# Patient Record
Sex: Male | Born: 1955
Health system: Southern US, Community
[De-identification: ages and names within clinical notes are randomized; demographics above are authoritative.]

## PROBLEM LIST (undated history)

## (undated) ENCOUNTER — Emergency Department (HOSPITAL_BASED_OUTPATIENT_CLINIC_OR_DEPARTMENT_OTHER): Admission: EM | Payer: 59 | Source: Home / Self Care

## (undated) DIAGNOSIS — M1990 Unspecified osteoarthritis, unspecified site: Secondary | ICD-10-CM

## (undated) DIAGNOSIS — N400 Enlarged prostate without lower urinary tract symptoms: Secondary | ICD-10-CM

## (undated) DIAGNOSIS — R112 Nausea with vomiting, unspecified: Secondary | ICD-10-CM

## (undated) DIAGNOSIS — S3991XA Unspecified injury of abdomen, initial encounter: Secondary | ICD-10-CM

## (undated) DIAGNOSIS — E291 Testicular hypofunction: Secondary | ICD-10-CM

## (undated) DIAGNOSIS — G8929 Other chronic pain: Secondary | ICD-10-CM

## (undated) DIAGNOSIS — M818 Other osteoporosis without current pathological fracture: Secondary | ICD-10-CM

## (undated) DIAGNOSIS — B029 Zoster without complications: Secondary | ICD-10-CM

## (undated) DIAGNOSIS — M542 Cervicalgia: Secondary | ICD-10-CM

## (undated) DIAGNOSIS — L719 Rosacea, unspecified: Secondary | ICD-10-CM

## (undated) DIAGNOSIS — N411 Chronic prostatitis: Secondary | ICD-10-CM

## (undated) DIAGNOSIS — Z9889 Other specified postprocedural states: Secondary | ICD-10-CM

## (undated) DIAGNOSIS — T7840XA Allergy, unspecified, initial encounter: Secondary | ICD-10-CM

## (undated) DIAGNOSIS — I7300 Raynaud's syndrome without gangrene: Secondary | ICD-10-CM

## (undated) DIAGNOSIS — I8390 Asymptomatic varicose veins of unspecified lower extremity: Secondary | ICD-10-CM

## (undated) DIAGNOSIS — K219 Gastro-esophageal reflux disease without esophagitis: Secondary | ICD-10-CM

## (undated) DIAGNOSIS — J309 Allergic rhinitis, unspecified: Secondary | ICD-10-CM

## (undated) DIAGNOSIS — J189 Pneumonia, unspecified organism: Secondary | ICD-10-CM

## (undated) DIAGNOSIS — H01009 Unspecified blepharitis unspecified eye, unspecified eyelid: Secondary | ICD-10-CM

## (undated) HISTORY — PX: SHOULDER SURGERY: SHX246

## (undated) HISTORY — DX: Gastro-esophageal reflux disease without esophagitis: K21.9

## (undated) HISTORY — DX: Allergy, unspecified, initial encounter: T78.40XA

## (undated) HISTORY — DX: Testicular hypofunction: E29.1

## (undated) HISTORY — DX: Other osteoporosis without current pathological fracture: M81.8

## (undated) HISTORY — DX: Unspecified injury of abdomen, initial encounter: S39.91XA

## (undated) HISTORY — DX: Cervicalgia: M54.2

## (undated) HISTORY — DX: Allergic rhinitis, unspecified: J30.9

## (undated) HISTORY — DX: Chronic prostatitis: N41.1

## (undated) HISTORY — DX: Raynaud's syndrome without gangrene: I73.00

## (undated) HISTORY — PX: KNEE ARTHROSCOPY: SUR90

## (undated) HISTORY — DX: Other chronic pain: G89.29

## (undated) HISTORY — PX: COLONOSCOPY: SHX174

## (undated) HISTORY — DX: Unspecified blepharitis unspecified eye, unspecified eyelid: H01.009

## (undated) HISTORY — PX: BLEPHAROPLASTY: SUR158

## (undated) HISTORY — PX: POLYPECTOMY: SHX149

## (undated) HISTORY — PX: EYE SURGERY: SHX253

## (undated) HISTORY — DX: Zoster without complications: B02.9

## (undated) HISTORY — PX: VARICOSE VEIN SURGERY: SHX832

## (undated) HISTORY — PX: SIGMOIDOSCOPY: SUR1295

## (undated) HISTORY — DX: Benign prostatic hyperplasia without lower urinary tract symptoms: N40.0

## (undated) HISTORY — DX: Asymptomatic varicose veins of unspecified lower extremity: I83.90

## (undated) HISTORY — DX: Rosacea, unspecified: L71.9

---

## 1977-07-06 HISTORY — PX: HERNIA REPAIR: SHX51

## 1992-07-06 HISTORY — PX: CERVICAL FUSION: SHX112

## 1996-07-06 HISTORY — PX: CERVICAL DISCECTOMY: SHX98

## 2004-05-06 ENCOUNTER — Emergency Department (HOSPITAL_COMMUNITY): Admission: EM | Admit: 2004-05-06 | Discharge: 2004-05-06 | Payer: Self-pay | Admitting: Emergency Medicine

## 2005-02-06 ENCOUNTER — Ambulatory Visit: Payer: Self-pay | Admitting: Internal Medicine

## 2005-06-26 ENCOUNTER — Ambulatory Visit: Payer: Self-pay | Admitting: Internal Medicine

## 2005-07-07 ENCOUNTER — Ambulatory Visit: Payer: Self-pay | Admitting: Internal Medicine

## 2005-07-13 ENCOUNTER — Ambulatory Visit (HOSPITAL_COMMUNITY): Admission: RE | Admit: 2005-07-13 | Discharge: 2005-07-13 | Payer: Self-pay | Admitting: Optometry

## 2005-11-02 ENCOUNTER — Ambulatory Visit: Payer: Self-pay | Admitting: Internal Medicine

## 2005-12-01 ENCOUNTER — Ambulatory Visit: Payer: Self-pay | Admitting: Internal Medicine

## 2006-03-15 ENCOUNTER — Ambulatory Visit: Payer: Self-pay | Admitting: Internal Medicine

## 2006-07-15 ENCOUNTER — Ambulatory Visit: Payer: Self-pay | Admitting: Internal Medicine

## 2006-10-08 ENCOUNTER — Ambulatory Visit: Payer: Self-pay | Admitting: Internal Medicine

## 2006-11-25 ENCOUNTER — Ambulatory Visit: Payer: Self-pay | Admitting: Internal Medicine

## 2006-12-07 ENCOUNTER — Encounter: Admission: RE | Admit: 2006-12-07 | Discharge: 2007-03-07 | Payer: Self-pay | Admitting: Neurology

## 2007-04-22 ENCOUNTER — Telehealth: Payer: Self-pay | Admitting: Internal Medicine

## 2007-05-04 ENCOUNTER — Encounter: Payer: Self-pay | Admitting: Internal Medicine

## 2007-08-11 ENCOUNTER — Ambulatory Visit: Payer: Self-pay | Admitting: Internal Medicine

## 2007-08-11 DIAGNOSIS — R51 Headache: Secondary | ICD-10-CM

## 2007-08-11 DIAGNOSIS — H01009 Unspecified blepharitis unspecified eye, unspecified eyelid: Secondary | ICD-10-CM | POA: Insufficient documentation

## 2007-08-11 DIAGNOSIS — M818 Other osteoporosis without current pathological fracture: Secondary | ICD-10-CM

## 2007-08-11 DIAGNOSIS — E291 Testicular hypofunction: Secondary | ICD-10-CM

## 2007-08-11 DIAGNOSIS — F519 Sleep disorder not due to a substance or known physiological condition, unspecified: Secondary | ICD-10-CM | POA: Insufficient documentation

## 2007-08-11 DIAGNOSIS — R519 Headache, unspecified: Secondary | ICD-10-CM | POA: Insufficient documentation

## 2007-08-11 HISTORY — DX: Testicular hypofunction: E29.1

## 2007-08-11 HISTORY — DX: Other osteoporosis without current pathological fracture: M81.8

## 2007-08-19 ENCOUNTER — Encounter (INDEPENDENT_AMBULATORY_CARE_PROVIDER_SITE_OTHER): Payer: Self-pay | Admitting: *Deleted

## 2007-10-11 ENCOUNTER — Encounter: Payer: Self-pay | Admitting: Internal Medicine

## 2007-10-11 LAB — CONVERTED CEMR LAB
ALT: 23 units/L
AST: 17 units/L
Albumin: 3.9 g/dL
Alkaline Phosphatase: 41 units/L
BUN: 18 mg/dL
CO2, serum: 30 mmol/L
Calcium: 8.9 mg/dL
Chloride, Serum: 107 mmol/L
Creatinine, Ser: 0.9 mg/dL
HCT: 47.6 %
Hemoglobin: 16.7 g/dL
MCH: 31.7 pg
MCV: 90.4 fL
PSA: 3.31 ng/mL
Platelets: 206 10*3/uL
Potassium, serum: 4 mmol/L
RBC count: 5.26 10*6/uL
Sodium, serum: 138 mmol/L
Testosterone, total: 4.27 ng/mL
Total Bilirubin: 1.1 mg/dL
Total Protein: 5.7 g/dL
WBC, blood: 6 10*3/uL

## 2007-11-03 ENCOUNTER — Encounter: Payer: Self-pay | Admitting: Internal Medicine

## 2007-11-03 LAB — CONVERTED CEMR LAB: PSA: 3.06 ng/mL

## 2008-01-02 ENCOUNTER — Encounter: Payer: Self-pay | Admitting: Internal Medicine

## 2008-04-11 ENCOUNTER — Encounter: Payer: Self-pay | Admitting: Internal Medicine

## 2008-04-12 ENCOUNTER — Ambulatory Visit: Payer: Self-pay | Admitting: Internal Medicine

## 2008-04-13 ENCOUNTER — Encounter: Payer: Self-pay | Admitting: Internal Medicine

## 2008-04-17 ENCOUNTER — Encounter (INDEPENDENT_AMBULATORY_CARE_PROVIDER_SITE_OTHER): Payer: Self-pay | Admitting: *Deleted

## 2008-05-09 ENCOUNTER — Ambulatory Visit: Payer: Self-pay | Admitting: Internal Medicine

## 2008-05-11 ENCOUNTER — Encounter (INDEPENDENT_AMBULATORY_CARE_PROVIDER_SITE_OTHER): Payer: Self-pay | Admitting: *Deleted

## 2008-06-06 ENCOUNTER — Encounter: Admission: RE | Admit: 2008-06-06 | Discharge: 2008-07-04 | Payer: Self-pay | Admitting: Sports Medicine

## 2008-07-03 ENCOUNTER — Ambulatory Visit: Payer: Self-pay | Admitting: Internal Medicine

## 2008-07-09 ENCOUNTER — Ambulatory Visit: Payer: Self-pay | Admitting: Internal Medicine

## 2008-07-09 ENCOUNTER — Encounter: Admission: RE | Admit: 2008-07-09 | Discharge: 2008-10-01 | Payer: Self-pay | Admitting: Sports Medicine

## 2008-08-20 ENCOUNTER — Encounter: Payer: Self-pay | Admitting: Internal Medicine

## 2008-10-09 ENCOUNTER — Encounter: Payer: Self-pay | Admitting: Internal Medicine

## 2008-12-11 ENCOUNTER — Encounter: Payer: Self-pay | Admitting: Internal Medicine

## 2008-12-17 ENCOUNTER — Encounter: Payer: Self-pay | Admitting: Internal Medicine

## 2009-01-10 ENCOUNTER — Telehealth (INDEPENDENT_AMBULATORY_CARE_PROVIDER_SITE_OTHER): Payer: Self-pay | Admitting: *Deleted

## 2009-01-14 ENCOUNTER — Encounter: Payer: Self-pay | Admitting: Internal Medicine

## 2009-01-14 ENCOUNTER — Telehealth (INDEPENDENT_AMBULATORY_CARE_PROVIDER_SITE_OTHER): Payer: Self-pay | Admitting: *Deleted

## 2009-01-23 ENCOUNTER — Telehealth (INDEPENDENT_AMBULATORY_CARE_PROVIDER_SITE_OTHER): Payer: Self-pay | Admitting: *Deleted

## 2009-01-25 ENCOUNTER — Encounter: Payer: Self-pay | Admitting: Internal Medicine

## 2009-04-03 ENCOUNTER — Ambulatory Visit: Payer: Self-pay | Admitting: Internal Medicine

## 2009-04-03 DIAGNOSIS — R109 Unspecified abdominal pain: Secondary | ICD-10-CM

## 2009-07-06 HISTORY — PX: TRANSURETHRAL RESECTION OF PROSTATE: SHX73

## 2009-07-17 ENCOUNTER — Encounter: Payer: Self-pay | Admitting: Internal Medicine

## 2009-09-04 ENCOUNTER — Ambulatory Visit: Payer: Self-pay | Admitting: Internal Medicine

## 2009-12-19 ENCOUNTER — Ambulatory Visit: Payer: Self-pay | Admitting: Internal Medicine

## 2009-12-19 DIAGNOSIS — J309 Allergic rhinitis, unspecified: Secondary | ICD-10-CM

## 2009-12-19 DIAGNOSIS — J3089 Other allergic rhinitis: Secondary | ICD-10-CM

## 2009-12-19 HISTORY — DX: Allergic rhinitis, unspecified: J30.9

## 2009-12-20 ENCOUNTER — Telehealth (INDEPENDENT_AMBULATORY_CARE_PROVIDER_SITE_OTHER): Payer: Self-pay | Admitting: *Deleted

## 2010-01-10 ENCOUNTER — Ambulatory Visit: Payer: Self-pay | Admitting: Internal Medicine

## 2010-01-10 DIAGNOSIS — L980 Pyogenic granuloma: Secondary | ICD-10-CM | POA: Insufficient documentation

## 2010-07-16 ENCOUNTER — Encounter: Payer: Self-pay | Admitting: Internal Medicine

## 2010-08-03 LAB — CONVERTED CEMR LAB
ALT: 25 units/L (ref 0–53)
AST: 21 units/L (ref 0–37)
BUN: 22 mg/dL (ref 6–23)
Basophils Absolute: 0 10*3/uL (ref 0.0–0.1)
Basophils Relative: 0.3 % (ref 0.0–3.0)
CO2: 31 meq/L (ref 19–32)
Calcium: 8.9 mg/dL (ref 8.4–10.5)
Chloride: 105 meq/L (ref 96–112)
Cholesterol: 197 mg/dL (ref 0–200)
Creatinine, Ser: 0.7 mg/dL (ref 0.4–1.5)
Eosinophils Absolute: 0.1 10*3/uL (ref 0.0–0.7)
Eosinophils Relative: 1.2 % (ref 0.0–5.0)
GFR calc Af Amer: 151 mL/min
GFR calc non Af Amer: 125 mL/min
Glucose, Bld: 97 mg/dL (ref 70–99)
HCT: 48.4 % (ref 39.0–52.0)
HDL: 37.4 mg/dL — ABNORMAL LOW (ref >39.0)
Hemoglobin: 17 g/dL (ref 13.0–17.0)
LDL Cholesterol: 142 mg/dL — ABNORMAL HIGH (ref 0–99)
Lymphocytes Relative: 30.5 % (ref 12.0–46.0)
MCHC: 35.2 g/dL (ref 30.0–36.0)
MCV: 91.9 fL (ref 78.0–100.0)
Monocytes Absolute: 0.4 10*3/uL (ref 0.1–1.0)
Monocytes Relative: 8 % (ref 3.0–12.0)
Neutro Abs: 2.8 10*3/uL (ref 1.4–7.7)
Neutrophils Relative %: 60 % (ref 43.0–77.0)
Platelets: 205 10*3/uL (ref 150–400)
Potassium: 4.1 meq/L (ref 3.5–5.1)
RBC: 5.27 M/uL (ref 4.22–5.81)
RDW: 12.2 % (ref 11.5–14.6)
Sodium: 141 meq/L (ref 135–145)
TSH: 1.66 microintl units/mL (ref 0.35–5.50)
Total CHOL/HDL Ratio: 5.3
Triglycerides: 88 mg/dL (ref 0–149)
VLDL: 18 mg/dL (ref 0–40)
WBC: 4.8 10*3/uL (ref 4.5–10.5)

## 2010-08-05 NOTE — Progress Notes (Signed)
Summary: change nasonex to flonase   Phone Note Call from Patient   Caller: Patient Summary of Call: patient called says he was told that if nasonex was to expensive he could be switch to flonase informed patient we will change and send into pharmacy .......Marland KitchenDoristine Devoid  December 20, 2009 2:09 PM     New/Updated Medications: FLONASE 50 MCG/ACT SUSP (FLUTICASONE PROPIONATE) 2 sprays each nostril daily Prescriptions: FLONASE 50 MCG/ACT SUSP (FLUTICASONE PROPIONATE) 2 sprays each nostril daily  #1 x 3   Entered by:   Doristine Devoid   Authorized by:   Nolon Rod. Paz MD   Signed by:   Doristine Devoid on 12/20/2009   Method used:   Electronically to        [facility]* (retail)       [address]       [place], [place]  [address]       Ph: [phone]       Fax: [phone]   RxID:   [id number]

## 2010-08-05 NOTE — Assessment & Plan Note (Signed)
Summary: BITE ON LFT HAND NOT HEALED FROM APRIL///SPH   Vital Signs:  Patient profile:   55 year old male Weight:      214.6 pounds Temp:     98.1 degrees F oral Pulse rate:   60 / minute Resp:     14 per minute BP sitting:   104 / 70  (left arm) Cuff size:   large  Vitals Entered By: Shonna Chock (January 10, 2010 1:57 PM) CC: Left hand concerns Comments REVIEWED MED LIST, PATIENT AGREED DOSE AND INSTRUCTION CORRECT    CC:  Left hand concerns.  History of Present Illness: He noted itching & burning  L dorsal hand on 10/07/2009 in early am  while in [place] . Rx: antibiotic ointment X 1-2 weeks with resolution of central "white spot" .He now has intermittent  residual itching. He now uses Neosporin 4-5X / week "to control the itching".  Allergies (verified): No Known Drug Allergies  Review of Systems General:  Denies chills, fever, sweats, and weight loss. Derm:  Complains of changes in color of skin; Persistant redness .  Physical Exam  General:  well-nourished,in no acute distress; alert,appropriate and cooperative throughout examination Skin:  3X3 mm non blanching granuloma dorsum L hand, ? foreign body centrally Cervical Nodes:  No lymphadenopathy noted Axillary Nodes:  No palpable lymphadenopathy. No epitrochlear LA   Impression & Recommendations:  Problem # 1:  GRANULOMA (ICD-686.1) ? foreign body reaction, ? component contact dermatitis from Neosporin   Complete Medication List: 1)  Neurontin  .Marland KitchenMarland Kitchen. 1800-2400 mg once daily 2)  Celebrex 200 Mg Caps (Celecoxib) .... Once daily 3)  Prilosec 20 Mg Cpdr (Omeprazole) .... As needed 4)  Testosterone Injections  .Marland Kitchen.. 1ml bi monthly 5)  Oracea 40 Mg Cpdr (Doxycycline (rosacea)) .... Per ophtalmology 6)  Ees Ointment, Eye  .... Per ophtalmology 7)  Flonase 50 Mcg/act Susp (Fluticasone propionate) .... 2 sprays each nostril daily 8)  Loratadine 10 Mg Tabs (Loratadine) .Marland Kitchen.. 1 a day for allergies (otc)  Patient  Instructions: 1)  Report Warning Signs as discussed. Stop Neosporin ; use Cort Aid two times a day as needed . Hand Surgery referral if lesion progresses

## 2010-08-05 NOTE — Assessment & Plan Note (Signed)
Summary: roa pet pt/lch   Vital Signs:  Patient profile:   55 year old male Height:      72 inches Weight:      213.2 pounds BMI:     29.02 Pulse rate:   60 / minute BP sitting:   100 / 60  Vitals Entered By: Shary Decamp (September 04, 2009 4:11 PM) CC: opth @ [facility] is requesting a sleep study, Ines Bloomer, MD phone [phone], fax [phone] Comments  - check rt thumb ?sliver in thumb Shary Decamp  September 04, 2009 4:12 PM    History of Present Illness: opth @ [facility] , Ines Bloomer, MD suggested that he may have a sleep apnea because the research shows a relationship between droopy  eyelids (which the patient has) and OSA  he removed a splinter from his right thumb few days ago, the area is still tender   Current Medications (verified): 1)  Ambien Cr 12.5 Mg Tbcr (Zolpidem Tartrate) .... Take 1 Tablet By Mouth Every Night 2)  Actonel 35 Mg Tabs (Risedronate Sodium) .... Take 1 Tablet By Mouth Once A Week 3)  Proctofoam 1 %  Foam (Pramoxine Hcl) .... 5 Times A Day Prn 4)  Neurontin .Marland KitchenMarland Kitchen. 1800-2400 Mg Once Daily 5)  Celebrex 200 Mg Caps (Celecoxib) .... Once Daily 6)  Prilosec 20 Mg Cpdr (Omeprazole) .... As Needed 7)  Testosterone Injections .Marland Kitchen.. 1ml Bi Monthly  Allergies (verified): No Known Drug Allergies  Past History:  Past Medical History: INSOMNIA-SLEEP DISORDER-UNSPEC  OSTEOPOROSIS, IDIOPATHIC-- f/u at [facility] HEADACHE--migraines BLEPHARITIS, BILATERAL  HYPOGONADISM--f/u by Dr Cleatrice Burke urology Chronic Prostatitis-- f/u by urology, has a prostate massage q 6 weeks  BPH , s/p TURP Cscope 1-11 Dr Posey Rea, neg   1-11  Past Surgical History: 4/03 false positive stress test 3/07 NCS VE TURP 1-11  Social History: Reviewed history from 04/12/2008 and no changes required. 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   Extremities:  right thumb has a 1 mm induration, quite superficial, no obvious FB   Impression & Recommendations:  Problem # 1:  INSOMNIA-SLEEP DISORDER-UNSPEC (ICD-307.40) he used to have insomnia and required Ambien but he is not taking that anymore. I will leave Ambien in the medications  list because he may use it as needed he has no evidence of sleep apnea on clinical grounds.  Will revisit this issue as needed  Problem # 2:  FB right thumb? I did a very small incision with a needle, no apparent foreign body  observation, patient will call if signs of infection.  Complete Medication List: 1)  Ambien Cr 12.5 Mg Tbcr (Zolpidem tartrate) .... Take 1 tablet by mouth every night 2)  Actonel 35 Mg Tabs (Risedronate sodium) .... Take 1 tablet by mouth once a week 3)  Proctofoam 1 % Foam (Pramoxine hcl) .... 5 times a day prn 4)  Neurontin  .Marland KitchenMarland Kitchen. 1800-2400 mg once daily 5)  Celebrex 200 Mg Caps (Celecoxib) .... Once daily 6)  Prilosec 20 Mg Cpdr (Omeprazole) .... As needed 7)  Testosterone Injections  .Marland Kitchen.. 1ml bi monthly

## 2010-08-05 NOTE — Assessment & Plan Note (Signed)
Summary: referral for an allergist//kn   Vital Signs:  Patient profile:   55 year old male Height:      72 inches Weight:      201 pounds Temp:     98.6 degrees F oral Pulse rate:   71 / minute BP sitting:   100 / 82  (left arm)  Vitals Entered By: Jeremy Johann CMA (December 19, 2009 11:24 AM) CC: discuss referral for allergist Comments REVIEWED MED LIST, PATIENT AGREED DOSE AND INSTRUCTION CORRECT    History of Present Illness: the patient went out of town for a few days, when he came back he realized that  his sinuses were congested, he has some postnasal dripping and was  clearing his throat consistently.    He was diagnosed with eye- rosacea  and blepharitis in his eye doctor recommend him being elevated for allergies because the allergies can impact his eye health  Allergies (verified): No Known Drug Allergies  Past History:  Past Medical History: INSOMNIA-SLEEP DISORDER-UNSPEC  OSTEOPOROSIS, IDIOPATHIC-- f/u at [facility] HEADACHE--migraines BLEPHARITIS, BILATERAL  HYPOGONADISM--f/u by Dr Cleatrice Burke urology Chronic Prostatitis-- f/u by urology, has a prostate massage q 6 weeks  BPH , s/p TURP Cscope 1-11 Dr Posey Rea, neg   1-11 Allergic rhinitis  Past Surgical History: Reviewed history from 09/04/2009 and no changes required. 4/03 false positive stress test 3/07 NCS VE TURP 1-11  Review of Systems       denies fevers No runny nose No sore throat No sneezing No coughing  Physical Exam  General:  alert, well-developed, and well-nourished.   Head:  face symmetric Ears:  R ear normal and L ear normal.   Nose:  not congested Mouth:  no redness or discharge Lungs:  normal respiratory effort, no intercostal retractions, no accessory muscle use, and normal breath sounds.   Heart:  normal rate, regular rhythm, and no murmur.     Impression & Recommendations:  Problem # 1:  ALLERGIC RHINITIS (ICD-477.9) some of his symptoms like postnasal dripping, suggest  allergies Options: Referral to allergy versus treatment We elected treatment with loratadine and Nasonex, patient  to let me know if no better or if he likes to be referred to an  allergiest His updated medication list for this problem includes:    Nasonex 50 Mcg/act Susp (Mometasone furoate) .Marland Kitchen... 2 puffs on each side of the nose once daily    Loratadine 10 Mg Tabs (Loratadine) .Marland Kitchen... 1 a day for allergies (otc)  Complete Medication List: 1)  Ambien Cr 12.5 Mg Tbcr (Zolpidem tartrate) .... Take 1 tablet by mouth every night*as needed** 2)  Neurontin  .Marland KitchenMarland Kitchen. 1800-2400 mg once daily 3)  Celebrex 200 Mg Caps (Celecoxib) .... Once daily 4)  Prilosec 20 Mg Cpdr (Omeprazole) .... As needed 5)  Testosterone Injections  .Marland Kitchen.. 1ml bi monthly 6)  Oracea 40 Mg Cpdr (Doxycycline (rosacea)) .... Per ophtalmology 7)  Ees Ointment, Eye  .... Per ophtalmology 8)  Nasonex 50 Mcg/act Susp (Mometasone furoate) .... 2 puffs on each side of the nose once daily 9)  Loratadine 10 Mg Tabs (Loratadine) .Marland Kitchen.. 1 a day for allergies (otc)  Patient Instructions: 1)  let me know if the allergies are not better  Prescriptions: NASONEX 50 MCG/ACT SUSP (MOMETASONE FUROATE) 2 puffs on each side of the nose once daily  #1 x 12   Entered and Authorized by:   Alfard Cochrane E. Mylon Mabey MD   Signed by:   Nolon Rod. Jayleigh Notarianni MD on 12/19/2009  Method used:   Print then Give to Patient   RxID:   [id number]

## 2010-08-05 NOTE — Letter (Signed)
Summary: idiopatic osteoporosis, plan: actonel holiday--- Endocrinology  [facility]   Imported By: Lanelle Bal 08/01/2009 15:22:29  _____________________________________________________________________  External Attachment:    Type:   Image     Comment:   External Document

## 2010-08-13 NOTE — Letter (Signed)
Summary: [facility] Endocr[facility]  [facility]   Imported By: Lanelle Bal 08/04/2010 13:49:44  _____________________________________________________________________  External Attachment:    Type:   Image     Comment:   External Document

## 2010-11-21 NOTE — Assessment & Plan Note (Signed)
[facility]                                   ON-CALL NOTE   BRYEN, HINDERMAN                     MRN:          [id number]  DATE:04/04/2006                            DOB:          12/28/1955    Caller is Ms. Orson Aloe, April 04, 2006 at 12:45 p.m. Phone number is  [phone].   REGULAR PHYSICIAN:  Dr. Drue Novel.   CHIEF COMPLAINT:  Needs to be seen, very sick.   I called the number, [phone], and actually verified it with the answering  service. I repeatedly got an answering service for someone named Okey Regal. I  left a message that just said if he is truly sick that he needs to be taken  to the emergency room for evaluation now, but could never get through to a  human being. I did verify this with the answering service and told them that  if they called back to please reach me with a different number.            ______________________________  Audrie Gallus Tower, MD      MAT/MedQ  DD:  04/04/2006  DT:  04/05/2006  Job #:  [id number]   cc:   Willow Ora, MD  Audrie Gallus. Milinda Antis, MD

## 2011-03-17 ENCOUNTER — Telehealth: Payer: Self-pay | Admitting: Internal Medicine

## 2011-03-17 DIAGNOSIS — L918 Other hypertrophic disorders of the skin: Secondary | ICD-10-CM

## 2011-03-17 NOTE — Telephone Encounter (Signed)
Agree, please arrange

## 2011-03-17 NOTE — Telephone Encounter (Signed)
Please advise 

## 2011-03-18 NOTE — Telephone Encounter (Signed)
Referral placed.

## 2011-04-23 ENCOUNTER — Ambulatory Visit (INDEPENDENT_AMBULATORY_CARE_PROVIDER_SITE_OTHER): Payer: BC Managed Care – PPO

## 2011-04-23 DIAGNOSIS — Z23 Encounter for immunization: Secondary | ICD-10-CM

## 2011-04-24 ENCOUNTER — Other Ambulatory Visit: Payer: Self-pay | Admitting: Internal Medicine

## 2011-04-24 NOTE — Telephone Encounter (Signed)
Nasonex request [last refill 12/20/09 #3x1 Last OV 01/10/2010]

## 2011-04-29 NOTE — Telephone Encounter (Signed)
Okay to call #1 bottle, no RF; no further refills without office visit. Has not been seen in a while ---> let pt know

## 2011-09-18 ENCOUNTER — Ambulatory Visit (INDEPENDENT_AMBULATORY_CARE_PROVIDER_SITE_OTHER): Payer: BC Managed Care – PPO | Admitting: Internal Medicine

## 2011-09-18 ENCOUNTER — Encounter: Payer: Self-pay | Admitting: Internal Medicine

## 2011-09-18 VITALS — BP 136/82 | HR 73 | Temp 97.8°F | Wt 201.0 lb

## 2011-09-18 DIAGNOSIS — M542 Cervicalgia: Secondary | ICD-10-CM

## 2011-09-18 MED ORDER — PREDNISONE 10 MG PO TABS: ORAL_TABLET | ORAL | Status: DC

## 2011-09-18 MED ORDER — HYDROCODONE-ACETAMINOPHEN 5-500 MG PO TABS: 1.0000 | ORAL_TABLET | Freq: Four times a day (QID) | ORAL | Status: AC | PRN

## 2011-09-18 NOTE — Progress Notes (Signed)
  Subjective:    Patient ID: Erik [name], male    DOB: [birth year], 56 y.o.   MRN: [id number]  HPI  Acute visit Had an accident at home 4 months ago, he missed ther last 3 steps of the stairs, fell flat, stop the fall with his hands, he did have direct impact in his chest and abdomen. Since then has developed a right-sided neck, shoulder and arm pain, feels like burning. he has a history of neck surgery several years ago and he was doing well up until the fall. Currently taking Celebrex,  hydrocodone as needed.  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 Admits to some tingling in the right upper extremity which is new since the fall.     Objective:   Physical Exam  Constitutional: He is oriented to person, place, and time. He appears well-developed. No distress.  Neck:       slt decreased turning to the R  Neurological: He is alert and oriented to person, place, and time.       Gait normal, strength and DTRs symmetric   Skin: Skin is warm and dry. He is not diaphoretic.  Psychiatric: He has a normal mood and affect. His behavior is normal. Judgment and thought content normal.       Assessment & Plan:

## 2011-09-18 NOTE — Assessment & Plan Note (Signed)
Neck pain: Refer to neurosurgery, he may need an MRI before he sees him. Prednisone Local heat Continue with Vicodin and Celebrex.

## 2011-09-23 ENCOUNTER — Encounter: Payer: Self-pay | Admitting: Internal Medicine

## 2011-10-21 ENCOUNTER — Encounter: Payer: Self-pay | Admitting: Internal Medicine

## 2011-10-21 ENCOUNTER — Ambulatory Visit (INDEPENDENT_AMBULATORY_CARE_PROVIDER_SITE_OTHER): Payer: BC Managed Care – PPO | Admitting: Internal Medicine

## 2011-10-21 DIAGNOSIS — Z0000 Encounter for general adult medical examination without abnormal findings: Secondary | ICD-10-CM | POA: Insufficient documentation

## 2011-10-21 DIAGNOSIS — M818 Other osteoporosis without current pathological fracture: Secondary | ICD-10-CM

## 2011-10-21 DIAGNOSIS — Z23 Encounter for immunization: Secondary | ICD-10-CM

## 2011-10-21 DIAGNOSIS — G43909 Migraine, unspecified, not intractable, without status migrainosus: Secondary | ICD-10-CM

## 2011-10-21 DIAGNOSIS — E291 Testicular hypofunction: Secondary | ICD-10-CM

## 2011-10-21 DIAGNOSIS — F519 Sleep disorder not due to a substance or known physiological condition, unspecified: Secondary | ICD-10-CM

## 2011-10-21 DIAGNOSIS — Z5189 Encounter for other specified aftercare: Secondary | ICD-10-CM

## 2011-10-21 NOTE — Assessment & Plan Note (Addendum)
Td 2003  Colonoscopy Dr Posey Rea, first Cscope 95621 (polyp) , then  01/02/2008 (tubular adenoma), recommend to contact GI.   Addendum: Patient emailed--- said GI recommend the next colonoscopy 07-2014 PSA per urology Doing great with life style! Labs  Lower extremity edema likely from varicose veins. Indications for surgery discussed (severe edema, aches, skin problems). Strategies to prevent edema discussed.

## 2011-10-21 NOTE — Progress Notes (Signed)
  Subjective:    Patient ID: Erik [name], male    DOB: [birth year], 56 y.o.   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 Tobacco-- never  ETOH-- never   Family History: colon Ca - no prostate Ca - no CAD - M,F,GP, late  54s  DM - GP, M HTN - F stroke - F first at age ~56 F deceased complications COPD M living Review of Systems No chest pain or shortness of breath No nausea, vomiting, diarrhea or blood in the stools. No anxiety-depression. Complaints of lower extremity edema, left more than right.     Objective:   Physical Exam  General -- alert, well-developed, and well-nourished.   Neck --no thyromegaly , normal carotid pulse Lungs -- normal respiratory effort, no intercostal retractions, no accessory muscle use, and normal breath sounds.   Heart-- normal rate, regular rhythm, no murmur, and no gallop.   Abdomen--soft, non-tender, no distention, no masses, no HSM, no guarding, and no rigidity.   Extremities-- he does have a significant  varicose veins, left worse than right, some edema. No erythema or skin issues. Good pedal pulses, good capillary refill. Neurologic-- alert & oriented X3 and strength normal in all extremities. Psych-- Cognition and judgment appear intact. Alert and cooperative with normal attention span and concentration.  not anxious appearing and not depressed appearing.        Assessment & Plan:

## 2011-10-21 NOTE — Assessment & Plan Note (Signed)
Followup by urology

## 2011-10-21 NOTE — Assessment & Plan Note (Signed)
Followup at [facility], patient reports that his last bone density test showed osteopenia.

## 2011-10-21 NOTE — Patient Instructions (Addendum)
Please come back fasting: FLP, CMP, TSH, CBC--- dx V70 Vitamin D-- dx V70 or osteoporosis ------- Please contact Dr. Roma Kayser  office and see about your next colonoscopy.

## 2011-10-21 NOTE — Assessment & Plan Note (Signed)
Resolved per patient.  

## 2011-10-23 ENCOUNTER — Other Ambulatory Visit: Payer: Self-pay | Admitting: Internal Medicine

## 2011-10-26 ENCOUNTER — Other Ambulatory Visit (INDEPENDENT_AMBULATORY_CARE_PROVIDER_SITE_OTHER): Payer: BC Managed Care – PPO

## 2011-10-26 DIAGNOSIS — Z5189 Encounter for other specified aftercare: Secondary | ICD-10-CM

## 2011-10-26 DIAGNOSIS — Z0000 Encounter for general adult medical examination without abnormal findings: Secondary | ICD-10-CM

## 2011-10-26 DIAGNOSIS — E291 Testicular hypofunction: Secondary | ICD-10-CM

## 2011-10-26 DIAGNOSIS — G43909 Migraine, unspecified, not intractable, without status migrainosus: Secondary | ICD-10-CM

## 2011-10-26 DIAGNOSIS — M818 Other osteoporosis without current pathological fracture: Secondary | ICD-10-CM

## 2011-10-26 LAB — LIPID PANEL
Cholesterol: 178 mg/dL (ref 0–200)
HDL: 42.5 mg/dL (ref >39.00)
LDL Cholesterol: 129 mg/dL — ABNORMAL HIGH (ref 0–99)
Total CHOL/HDL Ratio: 4
Triglycerides: 31 mg/dL (ref 0.0–149.0)
VLDL: 6.2 mg/dL (ref 0.0–40.0)

## 2011-10-26 LAB — CBC
HCT: 47.1 % (ref 39.0–52.0)
Hemoglobin: 16 g/dL (ref 13.0–17.0)
MCHC: 34 g/dL (ref 30.0–36.0)
MCV: 91.9 fl (ref 78.0–100.0)
Platelets: 164 10*3/uL (ref 150.0–400.0)
RBC: 5.13 Mil/uL (ref 4.22–5.81)
RDW: 13.4 % (ref 11.5–14.6)
WBC: 3.2 10*3/uL — ABNORMAL LOW (ref 4.5–10.5)

## 2011-10-26 LAB — COMPREHENSIVE METABOLIC PANEL
ALT: 22 U/L (ref 0–53)
AST: 21 U/L (ref 0–37)
Albumin: 4 g/dL (ref 3.5–5.2)
Alkaline Phosphatase: 59 U/L (ref 39–117)
BUN: 22 mg/dL (ref 6–23)
CO2: 27 mEq/L (ref 19–32)
Calcium: 8.6 mg/dL (ref 8.4–10.5)
Chloride: 105 mEq/L (ref 96–112)
Creatinine, Ser: 0.7 mg/dL (ref 0.4–1.5)
GFR: 117.48 mL/min (ref >60.00)
Glucose, Bld: 75 mg/dL (ref 70–99)
Potassium: 4 mEq/L (ref 3.5–5.1)
Sodium: 140 mEq/L (ref 135–145)
Total Bilirubin: 2 mg/dL — ABNORMAL HIGH (ref 0.3–1.2)
Total Protein: 5.9 g/dL — ABNORMAL LOW (ref 6.0–8.3)

## 2011-10-26 LAB — TSH: TSH: 1.8 u[IU]/mL (ref 0.35–5.50)

## 2011-10-27 LAB — VITAMIN D 25 HYDROXY (VIT D DEFICIENCY, FRACTURES): Vit D, 25-Hydroxy: 44 ng/mL (ref 30–89)

## 2011-11-02 ENCOUNTER — Encounter: Payer: Self-pay | Admitting: Internal Medicine

## 2011-11-02 NOTE — Telephone Encounter (Signed)
Please advise 

## 2011-11-04 ENCOUNTER — Telehealth: Payer: Self-pay | Admitting: Internal Medicine

## 2011-11-04 MED ORDER — SCOPOLAMINE 1 MG/3DAYS TD PT72: 1.0000 | MEDICATED_PATCH | TRANSDERMAL | Status: DC | PRN

## 2011-11-04 NOTE — Telephone Encounter (Signed)
Patient called and states he is going to be doing some [place] and would like to get a prescription for motion sickness if possible. Please review and advise Patient ph# [phone]

## 2011-11-04 NOTE — Telephone Encounter (Signed)
Advised patient, I sent a prescription for a patch. He needs to use one patch every 3 days. I strongly recommend him to try the patch before he takes the trip, some people have side effects and is better to know if he is intolerant to this medicine  before he leaves [place].

## 2011-11-04 NOTE — Telephone Encounter (Signed)
Please advise 

## 2011-11-05 NOTE — Telephone Encounter (Signed)
Discussed with pt

## 2012-01-01 ENCOUNTER — Encounter: Payer: Self-pay | Admitting: Internal Medicine

## 2012-01-04 ENCOUNTER — Telehealth: Payer: Self-pay | Admitting: Internal Medicine

## 2012-01-04 DIAGNOSIS — I8390 Asymptomatic varicose veins of unspecified lower extremity: Secondary | ICD-10-CM

## 2012-01-04 NOTE — Telephone Encounter (Signed)
Per Radiology, please add an additional order of U/S Venous, then choose whether it's Bilateral or Unilateral.  These are not done at [facility], are done at [facility].

## 2012-01-04 NOTE — Telephone Encounter (Signed)
Erik [name], Needs to be referred to interventional radiology for varicose veins treatment , see message below. I entered 2 orders, I don't know which one would be the correct one.  -------- Message from Erik [name] to Wanda Plump, MD sent at 01/01/2012 3:23 PM ----- At my Physical on April 17, Dr. Drue Novel said he could refer me to someone to address treatment my varicose veins in my ankles. I would like to proceed with a referral for that. My work travel schedule is relatively light right now so I have some availability the weeks of July 22 and 29 if they have any openings. Best number to contact me is [phone]. Thank you, Erik [name]

## 2012-01-05 NOTE — Telephone Encounter (Signed)
done

## 2012-01-19 ENCOUNTER — Other Ambulatory Visit (HOSPITAL_COMMUNITY): Payer: Self-pay | Admitting: Chiropractic Medicine

## 2012-01-19 ENCOUNTER — Ambulatory Visit (HOSPITAL_COMMUNITY)
Admission: RE | Admit: 2012-01-19 | Discharge: 2012-01-19 | Disposition: A | Discharge status: Home / Self Care | Payer: BC Managed Care – PPO | Source: Ambulatory Visit | Attending: Chiropractic Medicine | Admitting: Chiropractic Medicine

## 2012-01-19 DIAGNOSIS — M51379 Other intervertebral disc degeneration, lumbosacral region without mention of lumbar back pain or lower extremity pain: Secondary | ICD-10-CM | POA: Insufficient documentation

## 2012-01-19 DIAGNOSIS — M5137 Other intervertebral disc degeneration, lumbosacral region: Secondary | ICD-10-CM | POA: Insufficient documentation

## 2012-01-19 DIAGNOSIS — M545 Low back pain, unspecified: Secondary | ICD-10-CM | POA: Insufficient documentation

## 2012-01-19 DIAGNOSIS — M8430XA Stress fracture, unspecified site, initial encounter for fracture: Secondary | ICD-10-CM

## 2012-01-19 DIAGNOSIS — M5136 Other intervertebral disc degeneration, lumbar region: Secondary | ICD-10-CM

## 2012-01-19 DIAGNOSIS — M79609 Pain in unspecified limb: Secondary | ICD-10-CM | POA: Insufficient documentation

## 2012-01-27 ENCOUNTER — Ambulatory Visit
Admission: RE | Admit: 2012-01-27 | Discharge: 2012-01-27 | Disposition: A | Discharge status: Home / Self Care | Payer: BC Managed Care – PPO | Source: Ambulatory Visit | Attending: Internal Medicine | Admitting: Internal Medicine

## 2012-01-27 DIAGNOSIS — I8390 Asymptomatic varicose veins of unspecified lower extremity: Secondary | ICD-10-CM

## 2012-03-16 ENCOUNTER — Other Ambulatory Visit: Payer: Self-pay | Admitting: Internal Medicine

## 2012-03-16 NOTE — Telephone Encounter (Signed)
Ok #30, no RF 

## 2012-03-16 NOTE — Telephone Encounter (Signed)
Refill done.  

## 2012-03-16 NOTE — Telephone Encounter (Signed)
Ok to refill 

## 2012-03-17 ENCOUNTER — Other Ambulatory Visit: Payer: Self-pay | Admitting: Diagnostic Radiology

## 2012-03-17 DIAGNOSIS — I83819 Varicose veins of unspecified lower extremities with pain: Secondary | ICD-10-CM

## 2012-04-18 ENCOUNTER — Ambulatory Visit (INDEPENDENT_AMBULATORY_CARE_PROVIDER_SITE_OTHER): Payer: BC Managed Care – PPO | Admitting: Internal Medicine

## 2012-04-18 VITALS — BP 120/82 | HR 59 | Temp 98.1°F | Wt 182.0 lb

## 2012-04-18 DIAGNOSIS — F8081 Childhood onset fluency disorder: Secondary | ICD-10-CM

## 2012-04-18 DIAGNOSIS — R079 Chest pain, unspecified: Secondary | ICD-10-CM

## 2012-04-18 DIAGNOSIS — R634 Abnormal weight loss: Secondary | ICD-10-CM

## 2012-04-18 LAB — CBC WITH DIFFERENTIAL/PLATELET
Basophils Absolute: 0 10*3/uL (ref 0.0–0.1)
Basophils Relative: 0.5 % (ref 0.0–3.0)
Eosinophils Relative: 1.2 % (ref 0.0–5.0)
Hemoglobin: 15.8 g/dL (ref 13.0–17.0)
Lymphocytes Relative: 27.5 % (ref 12.0–46.0)
Monocytes Relative: 8.8 % (ref 3.0–12.0)
Neutro Abs: 2.4 10*3/uL (ref 1.4–7.7)
RBC: 5.05 Mil/uL (ref 4.22–5.81)

## 2012-04-18 LAB — COMPREHENSIVE METABOLIC PANEL
Alkaline Phosphatase: 51 U/L (ref 39–117)
BUN: 19 mg/dL (ref 6–23)
CO2: 30 mEq/L (ref 19–32)
GFR: 105.52 mL/min (ref >60.00)
Glucose, Bld: 75 mg/dL (ref 70–99)
Total Bilirubin: 2.1 mg/dL — ABNORMAL HIGH (ref 0.3–1.2)

## 2012-04-18 LAB — TSH: TSH: 1.99 u[IU]/mL (ref 0.35–5.50)

## 2012-04-18 NOTE — Progress Notes (Signed)
  Subjective:    Patient ID: Erik [name], male    DOB: [birth year], 56 y.o.   MRN: [id number]  HPI Here to discuss the following issues: Wife hasn't noticed the patient to be stuttering lately, he reports stuttering as a child but had no other issues later in life. He denies anxiety, depression, not taking any new medications. People at work or himself have noted stuttering. Denies facial paresthesias, slurred speech.  Also complains of chest pain for a few months, on and off, about 2 episodes a day, they are random, located behind the left nipple, may last for seconds to minutes, a couple of times is intense enough that he thought about going to the ER. No associated nausea or shortness of breath, had diaphoresis one time. Does not change with deep breaths or touching the area. Not exertional. Denies any GERD symptoms, fever or cough.  Has lost 30 pounds in 7 months, admits to a dramatic change in his exercise pattern, diet has improved significantly as well. He feels well. Lower extremity edema is at baseline, interventional radiology is working on scheduling a procedure for varicose veins. Denies palpitations. No anxiety or depression.  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   Neck --no thyromegaly Lungs -- normal respiratory effort, no intercostal retractions, no accessory muscle use, and normal breath sounds.   Heart-- normal rate, regular rhythm, no murmur, and no gallop.   Abdomen--soft, non-tender, no distention, no masses, no HSM, no guarding, and no rigidity.   Extremities-- some L>R  edema   Neurologic-- alert & oriented X3 and strength normal in all extremities. Psych-- Cognition and judgment appear intact. Alert and cooperative with normal attention span and concentration.  not anxious appearing and not depressed appearing.       Assessment & Plan:   Stuttering, recommend observation for now  Weight loss, he has lost 30 pounds in 7 months, likely due to to a significant increase in exercise and change his diet. Labs from April 2013 normal except for a slightly low serum protein. BMI is 24 which is healthy Plan: TSH LFT CBC Moderate lifestyle to prevent further weight loss  CP: 56 year old gentleman with family history of heart disease, he is a nonsmoker. EKG sinus brady, at baseline  I think is appropriate to proceed with a stress test in the chest x-ray. Will also check a BMP. ER if symptoms increase.

## 2012-04-18 NOTE — Patient Instructions (Addendum)
Please get your x-ray at the other [place]  office located at: [address], across from [facility].  Please go to the basement, this is a walk-in facility, they are open from 8:30 to 5:30 PM. Phone number [phone].  

## 2012-04-19 ENCOUNTER — Encounter: Payer: Self-pay | Admitting: Internal Medicine

## 2012-04-20 ENCOUNTER — Ambulatory Visit (INDEPENDENT_AMBULATORY_CARE_PROVIDER_SITE_OTHER)
Admission: RE | Admit: 2012-04-20 | Discharge: 2012-04-20 | Disposition: A | Discharge status: Home / Self Care | Payer: BC Managed Care – PPO | Source: Ambulatory Visit | Attending: Internal Medicine | Admitting: Internal Medicine

## 2012-04-20 ENCOUNTER — Other Ambulatory Visit: Payer: Self-pay | Admitting: Internal Medicine

## 2012-04-20 DIAGNOSIS — R079 Chest pain, unspecified: Secondary | ICD-10-CM

## 2012-04-20 NOTE — Telephone Encounter (Signed)
Ok to refill 

## 2012-04-20 NOTE — Telephone Encounter (Signed)
Refill done.  

## 2012-04-20 NOTE — Telephone Encounter (Signed)
Ok 60, no RF 

## 2012-04-25 ENCOUNTER — Encounter: Payer: Self-pay | Admitting: Internal Medicine

## 2012-05-03 ENCOUNTER — Ambulatory Visit
Admission: RE | Admit: 2012-05-03 | Discharge: 2012-05-03 | Disposition: A | Discharge status: Home / Self Care | Payer: BC Managed Care – PPO | Source: Ambulatory Visit | Attending: Diagnostic Radiology | Admitting: Diagnostic Radiology

## 2012-05-03 DIAGNOSIS — I83819 Varicose veins of unspecified lower extremities with pain: Secondary | ICD-10-CM

## 2012-05-04 ENCOUNTER — Ambulatory Visit (HOSPITAL_COMMUNITY): Payer: BC Managed Care – PPO | Attending: Cardiology | Admitting: Radiology

## 2012-05-04 VITALS — BP 109/71 | Ht 72.0 in | Wt 177.0 lb

## 2012-05-04 DIAGNOSIS — Z8249 Family history of ischemic heart disease and other diseases of the circulatory system: Secondary | ICD-10-CM | POA: Insufficient documentation

## 2012-05-04 DIAGNOSIS — R079 Chest pain, unspecified: Secondary | ICD-10-CM

## 2012-05-04 MED ORDER — TECHNETIUM TC 99M SESTAMIBI GENERIC - CARDIOLITE
33.0000 | Freq: Once | INTRAVENOUS | Status: AC | PRN
  Administered 2012-05-04: 33 via INTRAVENOUS

## 2012-05-04 MED ORDER — TECHNETIUM TC 99M SESTAMIBI GENERIC - CARDIOLITE
11.0000 | Freq: Once | INTRAVENOUS | Status: AC | PRN
  Administered 2012-05-04: 11 via INTRAVENOUS

## 2012-05-04 NOTE — Progress Notes (Signed)
[facility] SITE 3 NUCLEAR MED [address] [id number] [place] [address] [phone]  Cardiology Nuclear Med Study  Erik [name] is a 56 y.o. male     MRN : [id number]     DOB: 10-Feb-1956  Procedure Date: 05/04/2012  Nuclear Med Background Indication for Stress Test:  Evaluation for Ischemia History:  2003: H/O false (+) stress test Cardiac Risk Factors: Family History - CAD  Symptoms:  Chest Pain   Nuclear Pre-Procedure Caffeine/Decaff Intake:  None > 12 hrs NPO After: 7:00am 2 eggo's  Lungs:  clear O2 Sat: 98% on room air. IV 0.9% NS with Angio Cath:  20g  IV Site: R Antecubital x 1, tolerated well IV Started by:  Irean Hong, RN  Chest Size (in):  44 Cup Size: n/a  Height: 6' (1.829 m)  Weight:  177 lb (80.287 kg)  BMI:  Body mass index is 24.01 kg/(m^2). Tech Comments:  n/a    Nuclear Med Study 1 or 2 day study: 1 day  Stress Test Type:  Stress  Reading MD: Cassell Clement, MD  Order Authorizing Provider:  Willow Ora, MD  Resting Radionuclide: Technetium 28m Sestamibi  Resting Radionuclide Dose: 11.0 mCi   Stress Radionuclide:  Technetium 75m Sestamibi  Stress Radionuclide Dose: 33.0 mCi           Stress Protocol Rest HR: 58 Stress HR: 155  Rest BP: 109/71 Stress BP: 159/65  Exercise Time (min): 13:00 METS: 15.3   Predicted Max HR: 162 bpm % Max HR: 95.68 bpm Rate Pressure Product: 62952   Dose of Adenosine (mg):  n/a Dose of Lexiscan: n/a mg  Dose of Atropine (mg): n/a Dose of Dobutamine: n/a mcg/kg/min (at max HR)  Stress Test Technologist: Milana Na, EMT-P  Nuclear Technologist:  Doyne Keel, CNMT     Rest Procedure:  Myocardial perfusion imaging was performed at rest 45 minutes following the intravenous administration of Technetium 21m Sestamibi. Rest ECG: Sinus Bradycardia  Stress Procedure:  The patient performed treadmill exercise using a Bruce  Protocol for 13:00 minutes. The patient stopped due to total heart  rate acheived  and denied any chest pain.  There were no significant ST-T wave changes and occ pvcs/Vcuplets.  Technetium 28m Sestamibi was injected at peak exercise and myocardial perfusion imaging was performed after a brief delay. Stress ECG: No significant change from baseline ECG  QPS Raw Data Images:  Normal; no motion artifact; normal heart/lung ratio. Stress Images:  Normal homogeneous uptake in all areas of the myocardium. Rest Images:  Normal homogeneous uptake in all areas of the myocardium. Subtraction (SDS):  No evidence of ischemia. Transient Ischemic Dilatation (Normal <1.22):  1.06 Lung/Heart Ratio (Normal <0.45):  0.29  Quantitative Gated Spect Images QGS EDV:  131 ml QGS ESV:  58 ml  Impression Exercise Capacity:  Good exercise capacity. BP Response:  Normal blood pressure response. Clinical Symptoms:  No chest pain. ECG Impression:  No significant ST segment change suggestive of ischemia. Comparison with Prior Nuclear Study: No images to compare  Overall Impression:  Normal stress nuclear study.  LV Ejection Fraction: 56%.  LV Wall Motion:  NL LV Function; NL Wall Motion  [employer]

## 2012-05-05 ENCOUNTER — Encounter: Payer: Self-pay | Admitting: Internal Medicine

## 2012-05-11 ENCOUNTER — Other Ambulatory Visit: Payer: Self-pay | Admitting: Diagnostic Radiology

## 2012-05-11 DIAGNOSIS — I83812 Varicose veins of left lower extremities with pain: Secondary | ICD-10-CM

## 2012-05-31 ENCOUNTER — Ambulatory Visit
Admission: RE | Admit: 2012-05-31 | Discharge: 2012-05-31 | Disposition: A | Discharge status: Home / Self Care | Payer: BC Managed Care – PPO | Source: Ambulatory Visit | Attending: Diagnostic Radiology | Admitting: Diagnostic Radiology

## 2012-05-31 VITALS — BP 104/66 | HR 67 | Temp 98.0°F | Resp 14

## 2012-05-31 DIAGNOSIS — I83812 Varicose veins of left lower extremities with pain: Secondary | ICD-10-CM

## 2012-05-31 MED ORDER — DIAZEPAM 5 MG PO TABS: 10.0000 mg | ORAL_TABLET | Freq: Once | ORAL | Status: DC

## 2012-05-31 NOTE — Progress Notes (Signed)
0800  Informed consent obtained.  Patient given verbal & written discharge instructions.  0810  22 gauge x 1" insyte catheter started IV Right antecubital (x 1 attempt) w/ Saline lock & 7" extension.  Flushed w/ 10 ml NSS w/o difficulty.  Site "unremarkable."  0830  Procedure started.  1015  Procedure completed.  Patient wearing thigh high graduated compression garment on LLE.  IV d/c'd, catheter intact. Site "unremarkable".    1025  Ambulated x 10 minutes.  Gait steady.  Reviewed discharge instructions.  1040  Patient has a friend here to provide transportation home.  Discharged to home.

## 2012-06-09 ENCOUNTER — Ambulatory Visit
Admission: RE | Admit: 2012-06-09 | Discharge: 2012-06-09 | Disposition: A | Discharge status: Home / Self Care | Payer: BC Managed Care – PPO | Source: Ambulatory Visit | Attending: Diagnostic Radiology | Admitting: Diagnostic Radiology

## 2012-06-09 DIAGNOSIS — I83812 Varicose veins of left lower extremities with pain: Secondary | ICD-10-CM

## 2012-06-20 ENCOUNTER — Other Ambulatory Visit: Payer: Self-pay | Admitting: Diagnostic Radiology

## 2012-06-20 DIAGNOSIS — I83812 Varicose veins of left lower extremities with pain: Secondary | ICD-10-CM

## 2012-07-21 ENCOUNTER — Ambulatory Visit
Admission: RE | Admit: 2012-07-21 | Discharge: 2012-07-21 | Disposition: A | Discharge status: Home / Self Care | Payer: BC Managed Care – PPO | Source: Ambulatory Visit | Attending: Diagnostic Radiology | Admitting: Diagnostic Radiology

## 2012-07-21 ENCOUNTER — Other Ambulatory Visit: Payer: Self-pay | Admitting: Diagnostic Radiology

## 2012-07-21 DIAGNOSIS — I83812 Varicose veins of left lower extremities with pain: Secondary | ICD-10-CM

## 2012-08-16 ENCOUNTER — Ambulatory Visit (INDEPENDENT_AMBULATORY_CARE_PROVIDER_SITE_OTHER): Payer: BC Managed Care – PPO | Admitting: Internal Medicine

## 2012-08-16 VITALS — BP 110/74 | HR 66 | Temp 97.6°F | Wt 183.0 lb

## 2012-08-16 DIAGNOSIS — I7300 Raynaud's syndrome without gangrene: Secondary | ICD-10-CM

## 2012-08-16 HISTORY — DX: Raynaud's syndrome without gangrene: I73.00

## 2012-08-16 NOTE — Patient Instructions (Addendum)
Avoid exposure to excessive cold exposure If symptoms persist for more than an hour or have intense pain-- go to the hospital.  Raynaud's Syndrome Raynaud's Syndrome is a disorder of the blood vessels in your hands and feet. It occurs when small arteries of the arms/hands or legs/feet become sensitive to cold or emotional upset. This causes the arteries to constrict, or narrow, and reduces blood flow to the area. The color in the fingers or toes changes from white to bluish to red and this is not usually painful. There may be numbness and tingling. Sores on the skin (ulcers) can form. Symptoms are usually relieved by warming. HOME CARE INSTRUCTIONS   Avoid exposure to cold. Keep your whole body warm and dry. Dress in layers. Wear mittens or gloves when handling ice or frozen food and when outdoors. Use holders for glasses or cans containing cold drinks. If possible, stay indoors during cold weather.  Limit your use of caffeine. Switch to decaffeinated coffee, tea, and soda pop. Avoid chocolate.  Avoid smoking or being around cigarette smoke. Smoke will make symptoms worse.  Wear loose fitting socks and comfortable, roomy shoes.  Avoid vibrating tools and machinery.  If possible, avoid stressful and emotional situations. Exercise, meditation and yoga may help you cope with stress. Biofeedback may be useful.  Ask your caregiver about medicine (calcium channel blockers) that may control Raynaud's phenomena. SEEK MEDICAL CARE IF:   Your discomfort becomes worse, despite conservative treatment.  You develop sores on your fingers and toes that do not heal. Document Released: 06/19/2000 Document Revised: 09/14/2011 Document Reviewed: 06/26/2008 [facility] Patient Information 2013 [place], [place].

## 2012-08-16 NOTE — Progress Notes (Signed)
  Subjective:    Patient ID: Erik [name], male    DOB: [birth year], 57 y.o.   MRN: [id number]  HPI Acute visit For several months, he has noted his hands to get purple and red on and off, the changes encompasses the whole hands, has noted no specific triggers like cold exposure. He also had 3 specific events that he documented with photographs: Few weeks ago the tip of the left ring finger got blue, cold and painful ; symptoms lasted an hour and resolved spontaneously. Few days later the same thing happened to the right second finger. Today, they left ring finger and fifth finger got pale, white, cold and painful, again symptoms lasted an hour   Past Medical History:   OSTEOPOROSIS  f/u at [facility] , now improved to osteopenia HEADACHE--migraines   BLEPHARITIS, BILATERAL   HYPOGONADISM--f/u by Dr Cleatrice Burke urology   Chronic Prostatitis-- f/u by urology, has a prostate massage q 6 weeks   BPH , s/p TURP , on cialis   Back pain, on neurontin Varicose veins Rosacea  Past Surgical History:   Cspine surgery 1994 C4-5-6   4/03 false positive stress test   3/07 NCS VE   TURP 1-11   B blepharoplasty @ [facility] 08-2011  Social History:   Scientist, water quality for [employer]   Married, 3 kids, daughter is an Photographer   Tobacco-- never  ETOH-- never   Family History: colon Ca - no prostate Ca - no CAD - M,F,GP, late  28s   DM - GP, M HTN - F stroke - F first at age ~75 F deceased complications COPD M living  Review of Systems Denies any headaches, no palpitations. No arthralgias per se. No generalized myalgias. Denies blood in the urine or blood in the stools. No claudication. No changes in color of his feet and toes    Objective:   Physical Exam General -- alert, well-developed, healthy-appearing .   Neck -- normal carotid pulse  Lungs -- normal respiratory effort, no intercostal retractions, no accessory muscle use, and normal breath sounds.   Heart-- normal rate, regular  rhythm, no murmur, and no gallop.  extremities--  no pretibial edema bilaterally Normal femoral,pedal and radial pulses. Wrists and knuckles without synovitis. Hands slr redish, they feel cold, good capillary refills  Neurologic-- alert & oriented X3 and strength normal in all extremities. Psych-- Cognition and judgment appear intact. Alert and cooperative with normal attention span and concentration.  not anxious appearing and not depressed appearing.     Assessment & Plan:

## 2012-08-16 NOTE — Assessment & Plan Note (Signed)
Symptoms most likely related to Raynaud's phenomena. Plan is to check an ANA, sedimentation rate and rheumatoid factor. Refer to rheumatology, further workup?Erik [name] Knows he needs to seek help if symptoms are persistent.

## 2012-08-17 ENCOUNTER — Telehealth: Payer: Self-pay | Admitting: Internal Medicine

## 2012-08-17 NOTE — Telephone Encounter (Signed)
Ok to refill? Last OV 2.11.14 Last filled 10.16.13

## 2012-08-17 NOTE — Telephone Encounter (Signed)
Done

## 2012-08-18 ENCOUNTER — Encounter: Payer: Self-pay | Admitting: Internal Medicine

## 2012-08-20 ENCOUNTER — Other Ambulatory Visit: Payer: Self-pay

## 2012-08-24 ENCOUNTER — Other Ambulatory Visit: Payer: BC Managed Care – PPO

## 2012-08-24 ENCOUNTER — Ambulatory Visit: Payer: BC Managed Care – PPO

## 2012-08-25 ENCOUNTER — Ambulatory Visit: Payer: BC Managed Care – PPO

## 2012-08-25 ENCOUNTER — Other Ambulatory Visit: Payer: BC Managed Care – PPO

## 2012-09-13 ENCOUNTER — Ambulatory Visit
Admission: RE | Admit: 2012-09-13 | Discharge: 2012-09-13 | Disposition: A | Discharge status: Home / Self Care | Payer: BC Managed Care – PPO | Source: Ambulatory Visit | Attending: Diagnostic Radiology | Admitting: Diagnostic Radiology

## 2012-09-13 DIAGNOSIS — I83812 Varicose veins of left lower extremities with pain: Secondary | ICD-10-CM

## 2012-10-27 ENCOUNTER — Other Ambulatory Visit (HOSPITAL_COMMUNITY): Payer: Self-pay | Admitting: Diagnostic Radiology

## 2012-10-27 DIAGNOSIS — I83812 Varicose veins of left lower extremities with pain: Secondary | ICD-10-CM

## 2012-11-07 ENCOUNTER — Telehealth: Payer: Self-pay | Admitting: Internal Medicine

## 2012-11-07 NOTE — Telephone Encounter (Signed)
Ok to refill? Last OV 2.11.14 Last filled 2.12.14

## 2012-11-07 NOTE — Telephone Encounter (Signed)
I printed #30, no Rf. Needs a controlled substance agreement and a urine sample Also, due for a CPX, please arrange

## 2012-11-08 ENCOUNTER — Encounter: Payer: Self-pay | Admitting: *Deleted

## 2012-11-08 NOTE — Telephone Encounter (Signed)
Pt made aware rx is ready to be picked up at front desk w/ controlled substance contract.

## 2012-11-30 ENCOUNTER — Telehealth: Payer: Self-pay | Admitting: Internal Medicine

## 2012-11-30 NOTE — Telephone Encounter (Signed)
Spoke with pt, he will drop the form off tomorrow.

## 2012-11-30 NOTE — Telephone Encounter (Signed)
I am willing to see the form but only fill it if appropriate.

## 2012-11-30 NOTE — Telephone Encounter (Signed)
Pt's last OV was 2.11.14 & lab work was done. Does pt need to schedule an appt to have wellness form completed? Please advise.

## 2012-11-30 NOTE — Telephone Encounter (Signed)
Pt has a work wellness form that he wants filled out- I have tried to let the pt know he hasn't a phy in years that I can see. I have tried to tell the patient that he may need to come in for a visit. Pt is refusing to even talk about a visit he says it is just a stupid from and he doesn't need to come in. Pls call patient and let him know what he will need to do.

## 2012-12-06 ENCOUNTER — Encounter: Payer: Self-pay | Admitting: Internal Medicine

## 2013-01-03 ENCOUNTER — Other Ambulatory Visit: Payer: Self-pay | Admitting: Internal Medicine

## 2013-01-03 NOTE — Telephone Encounter (Signed)
Ok to refill? Last OV 2.11.14 Last filled 2.12.14

## 2013-01-03 NOTE — Telephone Encounter (Signed)
OK #30 

## 2013-01-17 ENCOUNTER — Telehealth: Payer: Self-pay | Admitting: Radiology

## 2013-01-17 NOTE — Telephone Encounter (Signed)
Pt to call back to schedule follow up [facility] of [facility] Amado Nash, RN 01/17/2013 10:18 AM

## 2013-02-08 ENCOUNTER — Telehealth: Payer: Self-pay | Admitting: Internal Medicine

## 2013-02-08 NOTE — Telephone Encounter (Signed)
Arrange a visit please. Proceed w/ GI ref

## 2013-02-08 NOTE — Telephone Encounter (Signed)
Patient states his GI doctor is no longer seeing patients ([facility]) and he would like referral to new GI doctor. Also, he states he is having some speech problems that he would like to discuss with one of the nurses. CB# [phone]

## 2013-02-08 NOTE — Telephone Encounter (Signed)
Spoke with patient and advised that we would be glad to refer. Advised that we would need a release to get records.  Patient concerned because his stuttering has increased. He states that his wife and others have expressed it to him. 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 Do you have any recommendations for him or would you like him to be seen?

## 2013-02-09 NOTE — Telephone Encounter (Signed)
Patient to come office and fill out form to request records be sent. Referral can be made when patient is ready for a visit. Does not need a visit at this time.

## 2013-02-13 ENCOUNTER — Ambulatory Visit (INDEPENDENT_AMBULATORY_CARE_PROVIDER_SITE_OTHER): Payer: BC Managed Care – PPO | Admitting: Internal Medicine

## 2013-02-13 ENCOUNTER — Encounter: Payer: Self-pay | Admitting: Internal Medicine

## 2013-02-13 VITALS — BP 115/80 | HR 75 | Temp 97.4°F | Wt 185.2 lb

## 2013-02-13 DIAGNOSIS — R4789 Other speech disturbances: Secondary | ICD-10-CM

## 2013-02-13 DIAGNOSIS — R479 Unspecified speech disturbances: Secondary | ICD-10-CM

## 2013-02-13 NOTE — Progress Notes (Signed)
  Subjective:    Patient ID: Erik [name], male    DOB: January 07, 1956, 57 y.o.   MRN: [id number]  HPI Acute visit One-year history of stuttering, new onset, initially noted bu his wife; he speaks for work, stuttering according to the patient has disrupt the classes few times.  When asked, he denies alurred speech but admits that  occasionally disconnects from his environment briefly. His not taking any new medication. Admits to mild anxiety lately (more than usual).  Past Medical History:   OSTEOPOROSIS  f/u at [facility] , now improved to osteopenia HEADACHE--migraines   BLEPHARITIS, BILATERAL   HYPOGONADISM--f/u by Dr Cleatrice Burke urology   Chronic Prostatitis-- f/u by urology, has a prostate massage q 6 weeks   BPH , s/p TURP , on cialis   Back pain, on neurontin Varicose veins Rosacea  Past Surgical History:   Cspine surgery 1994 C4-5-6   4/03 false positive stress test   3/07 NCS VE   TURP 1-11   B blepharoplasty @ [facility] 08-2011  Social History:   Scientist, water quality for [employer]   Married, 3 kids, daughter is an Photographer   Tobacco-- never   ETOH-- never   Family History: colon Ca - no prostate Ca - no CAD - M,F,GP, late  18s   DM - GP, M HTN - F stroke - F first at age ~14 F deceased complications COPD M living  Review of Systems No focal deficit, face paresthesias, motor deficits No weight loss, fever, chills, headaches.  no bladder or bowel incontinence.     Objective:   Physical Exam BP 115/80  Pulse 75  Temp(Src) 97.4 F (36.3 C) (Oral)  Wt 185 lb 3.2 oz (84.006 kg)  BMI 25.11 kg/m2  SpO2 97%  General -- alert, well-developed, NAD.   Lungs -- normal respiratory effort, no intercostal retractions, no accessory muscle use, and normal breath sounds.   Heart-- normal rate, regular rhythm, no murmur, and no gallop.    Neurologic-- alert & oriented X3; Today, speech is fluent, cranial nerves intact (mild weakness at the left face, saw his dentist today,  had anesthesia). DTRs decreased but symmetric and strength normal in all extremities. Psych-- Cognition and judgment appear intact. Alert and cooperative with normal attention span and concentration.  not anxious appearing and not depressed appearing.       Assessment & Plan:

## 2013-02-13 NOTE — Assessment & Plan Note (Signed)
Speech difficulty, Reportedly the patient stutters, he did not do that at this visit. On further questioning, he feels disconnected from time to time, I wonder if he has absent seizures. Plan: Refer to neurology and see if further eval is needed.

## 2013-02-13 NOTE — Patient Instructions (Addendum)
Schedule a physical at your convenience  

## 2013-02-17 ENCOUNTER — Telehealth: Payer: Self-pay | Admitting: Internal Medicine

## 2013-02-17 NOTE — Telephone Encounter (Signed)
Extensive GI records reviewed (Scanned). Colonoscopy 04-18-03: Large adenomatous polyp Flex sigmoidoscopy 09-18-03: Large serrated adenomatous rectal polyp Colonoscopy 06-20-04 negative Colonoscopy 01-02-08  polyp, tubular adenoma Enterography 07-19-09 essentially negative Colonoscopy 07-25-09 negative, next in 5 years

## 2013-02-22 ENCOUNTER — Inpatient Hospital Stay: Admission: RE | Admit: 2013-02-22 | Payer: BC Managed Care – PPO | Source: Ambulatory Visit

## 2013-02-22 ENCOUNTER — Other Ambulatory Visit: Payer: BC Managed Care – PPO

## 2013-03-10 ENCOUNTER — Other Ambulatory Visit: Payer: Self-pay | Admitting: Internal Medicine

## 2013-03-10 NOTE — Telephone Encounter (Signed)
Last filled: 01/03/2013 #30, 0 refills  Last visit: 02/03/2013  UDS: 11/11/12-Low Risk Contract on fil  Please advise. SW, CMA

## 2013-03-10 NOTE — Telephone Encounter (Signed)
Dr Leta Jungling patient

## 2013-03-10 NOTE — Telephone Encounter (Signed)
rx refill-Norco/Vicodin 5-325mg   Last OV-02/13/13 No future OV  Last refilled- 01/03/13 #30 / 0 refills  Low risk UDS contract- 11/14/12  Please advise.

## 2013-03-13 NOTE — Telephone Encounter (Signed)
He sees Dr Drue Novel

## 2013-03-20 ENCOUNTER — Telehealth: Payer: Self-pay | Admitting: *Deleted

## 2013-03-20 ENCOUNTER — Ambulatory Visit (INDEPENDENT_AMBULATORY_CARE_PROVIDER_SITE_OTHER): Payer: BC Managed Care – PPO | Admitting: Neurology

## 2013-03-20 ENCOUNTER — Encounter: Payer: Self-pay | Admitting: Neurology

## 2013-03-20 VITALS — BP 98/70 | HR 72 | Temp 97.8°F | Resp 16 | Ht 72.0 in | Wt 184.0 lb

## 2013-03-20 DIAGNOSIS — R479 Unspecified speech disturbances: Secondary | ICD-10-CM

## 2013-03-20 DIAGNOSIS — R4789 Other speech disturbances: Secondary | ICD-10-CM

## 2013-03-20 NOTE — Telephone Encounter (Signed)
rx request- Hydrocodone  Last OV- 02/13/13 Last refilled- 01/03/13  UDS on contract/ LOW- 11/11/12 Please advise- DJR

## 2013-03-20 NOTE — Progress Notes (Signed)
[facility] Clinic Note - Initial Visit   Date: March 20, 2013   Erik [name] MRN: [id number] DOB: May 30, 1956   Dear Dr Drue Novel:  Thank you for your kind referral of Erik [name] for consultation of speech abnormalities. Although his history is well known to you, please allow [place] to reiterate it for the purpose of our medical record. The patient was accompanied to the clinic by self.   History of Present Illness: Erik [name] is a 57 y.o. year-old right-handed Caucasian male with history of Raynaud's syndrome and chronic neck pain s/p cervical fusion (C4-C6) presenting for evaluation of stuterring.  Patient has not noticed the stuttering to be a problem, but about 5-years ago, his wife noticed that he would stutter.  His family has pointed out at he will repeat the same word several times and then move on, but he is not aware of this.  He does not hear himself stuttering.  It was never bothersome to him, so did not seek any attention.  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  He is still able to get through the course, but his confidence has been affected after his students pointed this out to him.  Patient does not notice when these spells occur, so he does not know how often it occurs.  Denies any alleviating or exacerbating factors.  No slurring of speech.  Reading and writing is normal.  Also over the past several years, he has times where he will be talking midstream and he briefly forgets his next thought.  It lasts less than one minute.  He takes time to gather his thoughts and then he is able to move on.  It occurs several times per week.  He is aware of this and there is no loss of consciousness.  In the middle of  conversation, he stops and it takes him time to get his thought process back.  No associated confusion, limb shaking, or blank starring spells.  Clinic note from Dr. Leta Jungling office dated 8/11 was reviewed.  He was concerned about seizures, so referred the patient for neurological evaluation.     Past Medical History  Diagnosis Date  . ALLERGIC RHINITIS 12/19/2009    Qualifier: Diagnosis of  By: Drue Novel MD, Nolon Rod.   . HYPOGONADISM, MALE 08/11/2007    Qualifier: Diagnosis of  By: Drue Novel MD, Nolon Rod OSTEOPOROSIS, IDIOPATHIC 08/11/2007    Qualifier: Diagnosis of  By: Drue Novel MD, Nolon Rod.   . Raynaud's disease /phenomenon 08/16/2012  . Neck pain, chronic     Past Surgical History  Procedure Laterality Date  . Cervical fusion  1994    C4-C5-C6 fusion     Medications:  Current Outpatient Prescriptions on File Prior to Visit  Medication Sig Dispense Refill  . celecoxib (CELEBREX) 200 MG capsule Take 200 mg by mouth daily.      Marland Kitchen gabapentin (NEURONTIN) 400 MG capsule Take 400 mg by mouth 2 (two) times daily.       Marland Kitchen HYDROcodone-acetaminophen (NORCO/VICODIN) 5-325 MG per tablet TAKE ONE (1) TABLET BY MOUTH EVERY 6 HOURS AS NEEDED FOR PAIN  30 tablet  0  . loratadine (CLARITIN) 10 MG tablet Take 10 mg by mouth daily.      Marland Kitchen  omeprazole (PRILOSEC) 20 MG capsule Take 20 mg by mouth daily.      . tadalafil (CIALIS) 5 MG tablet Take 5 mg by mouth daily.       Marland Kitchen testosterone cypionate (DEPOTESTOTERONE CYPIONATE) 100 MG/ML injection Inject into the muscle every 14 (fourteen) days. For IM use only      . fluticasone (FLONASE) 50 MCG/ACT nasal spray USE TWO SPRAYS INTO EACHNOSTRIL DAILY  16 mL  0   No current facility-administered medications on file prior to visit.    Allergies:  Allergies  Allergen Reactions  . Sulfa Drugs Cross Reactors Nausea And Vomiting    Family History: Family History  Problem Relation Age of Onset  . COPD Father     Died, 64  . Diabetes Mother   . Hypertension Mother   .  CAD Mother     Living, 47  . Hypertension Sister   . Fibromyalgia Sister   . Hyperlipidemia Sister     Social History: History   Social History  . Marital Status: Married    Spouse Name: N/A    Number of Children: N/A  . Years of Education: N/A   Occupational History  . Not on file.   Social History Main Topics  . Smoking status: Never Smoker   . Smokeless tobacco: Not on file  . Alcohol Use: No  . Drug Use: No  . Sexual Activity: Not on file   Other Topics Concern  . Not on file   Social History Narrative   He works as a Scientist, water quality.  Lives with wife.  They have three children.    Review of Systems:  CONSTITUTIONAL: No fevers, chills, night sweats, or weight loss.   EYES: No visual changes or eye pain ENT: No hearing changes.  No history of nose bleeds.   RESPIRATORY: No cough, wheezing and shortness of breath.   CARDIOVASCULAR: Negative for chest pain, and palpitations.   GI: Negative for abdominal discomfort, blood in stools or black stools.  No recent change in bowel habits.   GU:  No history of incontinence.   MUSCLOSKELETAL: No history of joint pain or swelling.  No myalgias.  +neck pain SKIN: Negative for lesions, rash, and itching.   HEMATOLOGY/ONCOLOGY: Negative for prolonged bleeding, bruising easily, and swollen nodes.  No history of cancer.   ENDOCRINE: Negative for cold or heat intolerance, polydipsia or goiter.   PSYCH:  No depression or anxiety symptoms.   NEURO: As Above.   Vital Signs:  BP 98/70  Pulse 72  Temp(Src) 97.8 F (36.6 C) (Oral)  Resp 16  Ht 6' (1.829 m)  Wt 184 lb (83.462 kg)  BMI 24.95 kg/m2   General Medical Exam:   General:  Well appearing, comfortable.   Eyes/ENT: see cranial nerve examination.   Neck: No masses appreciated.  Full range of motion without tenderness.  No carotid bruits. Respiratory:  Clear to auscultation, good air entry bilaterally.   Cardiac:  Regular rate and rhythm, no murmur.   GI:  Soft,  non-tender, non-distended abdomen.  Extremities:  No deformities, edema, or skin discoloration. Good capillary refill.   Skin:  Skin color, texture, turgor normal. No rashes or lesions.  Neurological Exam: MENTAL STATUS including orientation to time, place, person, recent and remote memory, attention span and concentration, language, and fund of knowledge is normal.  Speech is not dysarthric.  No semantic or paraphasic errors.  Naming intact.  No stuttering is observed on today's visit.  CRANIAL NERVES:  II:  No visual field defects.  Unremarkable fundi. III-IV-VI: Pupils equal round and reactive to light.  Normal conjugate, extra-ocular eye movements in all directions of gaze.  No nystagmus.  No ptosis.   V:  Normal facial sensation.    VII:  Normal facial symmetry and movements.  VIII:  Normal hearing and vestibular function.   IX-X:  Normal palatal movement.   XI:  Normal shoulder shrug and head rotation.   XII:  Normal tongue strength and range of motion, no deviation or fasciculation.  MOTOR:  No atrophy, fasciculations or abnormal movements.  No pronator drift.  Tone is normal.    Right Upper Extremity:    Left Upper Extremity:    Deltoid  5/5   Deltoid  5/5   Biceps  5/5   Biceps  5/5   Triceps  5/5   Triceps  5/5   Wrist extensors  5/5   Wrist extensors  5/5   Wrist flexors  5/5   Wrist flexors  5/5   Finger extensors  5/5   Finger extensors  5/5   Finger flexors  5/5   Finger flexors  5/5   Dorsal interossei  5/5   Dorsal interossei  5/5   Abductor pollicis  5/5   Abductor pollicis  5/5   Tone (Ashworth scale)  0  Tone (Ashworth scale)  0   Right Lower Extremity:    Left Lower Extremity:    Hip flexors  5/5   Hip flexors  5/5   Hip extensors  5/5   Hip extensors  5/5   Knee flexors  5/5   Knee flexors  5/5   Knee extensors  5/5   Knee extensors  5/5   Dorsiflexors  5/5   Dorsiflexors  5/5   Plantarflexors  5/5   Plantarflexors  5/5   Toe extensors  5/5   Toe extensors   5/5   Toe flexors  5/5   Toe flexors  5/5   Tone (Ashworth scale)  0  Tone (Ashworth scale)  0   MSRs:  Right                                                                 Left brachioradialis 1+  brachioradialis 1+  biceps 1+  biceps 1+  triceps 1+  triceps 1+  patellar 2+  patellar 2+  ankle jerk 1+  ankle jerk 1+  Hoffman no  Hoffman no  plantar response down  plantar response down    SENSORY:  Patchy area over the left great toe with diminished vibration and pin prick, otherwise normal and symmetric perception of light touch, pinprick, vibration, and proprioception.  Romberg's sign absent.    COORDINATION/GAIT: Normal finger-to- nose-finger and heel-to-shin.  Intact rapid alternating movements bilaterally.  Able to rise from a chair without using arms.  Gait narrow based and stable. Tandem and stressed gait intact.   Data: Component     Latest Ref Rng 04/18/2012 08/16/2012  TSH     0.35 - 5.50 uIU/mL 1.99   ANA     NEGATIVE  NEG  Sed Rate     0 - 22 mm/hr  4  Rheumatoid Factor     <=14 IU/mL  <10  IMPRESSION: Erik [name] is a 57 year-old man presenting for evaluation of intermittent stuttering.  His neurological exam is non-focal and I did not see any stuttering today.  Given his normal exam and since there is no fixed deficit, I reassured him that the likelihood of an intracranial lesion affecting his speech is very low.  Regarding his episodes of loosing his thought while talking at times, I offered to perform a routine EEG to look for seizures, however, patient declined and would like to observe his symptoms over the next 4-6 months.     PLAN/RECOMMENDATIONS:  1.  Patient would like to observe symptoms for the next few months.  I instructed him to pay attention to any patterns such as triggers, stress level, etc. during these spells 2.  Consider routine EEG going forward 3.  Patient to return to clinic as needed  The duration of this appointment visit was 45  minutes of face-to-face time with the patient.  Greater than 50% of this time was spent in counseling, explanation of diagnosis, planning of further management, and coordination of care.   Thank you for allowing me to participate in patient's care.  If I can answer any additional questions, I would be pleased to do so.    Sincerely,    Dantae Meunier K. Allena Katz, DO

## 2013-03-20 NOTE — Telephone Encounter (Signed)
This is Dr Leta Jungling patient; please verify he wants to continue the medication.

## 2013-03-20 NOTE — Telephone Encounter (Signed)
Rx requesting for Hydrocodone 5/325 mg Last ov-02/13/13 Last date filled-01/03/13  #30 0 R  Last UDS 11/11/12 Low Contract is on file.   Please Advise  Ag cma

## 2013-03-20 NOTE — Telephone Encounter (Signed)
rx request- hydrocodone Last OV- 02/13/13 Last refilled- 01/03/13 #30 / 0 refills UDS on contract LOW- 11/11/12  Please advise. DJR

## 2013-03-21 ENCOUNTER — Telehealth: Payer: Self-pay | Admitting: *Deleted

## 2013-03-21 DIAGNOSIS — G894 Chronic pain syndrome: Secondary | ICD-10-CM

## 2013-03-21 MED ORDER — HYDROCODONE-ACETAMINOPHEN 5-325 MG PO TABS: ORAL_TABLET | ORAL | Status: DC

## 2013-03-21 NOTE — Telephone Encounter (Signed)
Patient requesting refill on Vicodin Last OV 02/13/13 Last filled 01/03/13 #30 no refills Contract on file, low risk UDS Okay to refill?

## 2013-03-21 NOTE — Telephone Encounter (Signed)
Rx for Vicodin printed and placed on ledge for signature

## 2013-03-21 NOTE — Telephone Encounter (Signed)
refillx1

## 2013-03-21 NOTE — Telephone Encounter (Signed)
Rx faxed.    KP 

## 2013-04-07 ENCOUNTER — Telehealth: Payer: Self-pay | Admitting: Internal Medicine

## 2013-04-07 NOTE — Telephone Encounter (Signed)
Patient states that his GI provider in [facility] has retired and he wants to transfer to [facility]. He would like a referral to be sent to GI because he is due for a follow-up.

## 2013-04-07 NOTE — Telephone Encounter (Signed)
Please advise 

## 2013-04-10 NOTE — Telephone Encounter (Signed)
According to the records reviewed, last colonoscopy was 07-25-09 negative, next in 5 years. Is not time yet to see GI, we can discuss referral when he comes back

## 2013-04-11 NOTE — Telephone Encounter (Signed)
lmovm 10/7 DJR

## 2013-04-13 NOTE — Telephone Encounter (Signed)
Pt notified and agrees to discuss more on his next apt. @ 04/24/13.

## 2013-04-20 ENCOUNTER — Telehealth: Payer: Self-pay

## 2013-04-20 NOTE — Telephone Encounter (Addendum)
Unable to reach prior to visit  Immunizations due: admin flu vaccine upon arrival  A/P:  LAST: PSA: WNL 09/2011 Urology   CCS: UTD 07/2009  DM: Due 10/2011 Eye Exam:   HTN: Due 10/2011 Lipids: Due 10/2011

## 2013-04-24 ENCOUNTER — Encounter: Payer: Self-pay | Admitting: Internal Medicine

## 2013-04-24 ENCOUNTER — Ambulatory Visit (INDEPENDENT_AMBULATORY_CARE_PROVIDER_SITE_OTHER): Payer: BC Managed Care – PPO | Admitting: Internal Medicine

## 2013-04-24 VITALS — BP 122/64 | HR 63 | Temp 97.6°F | Ht 73.0 in | Wt 188.0 lb

## 2013-04-24 DIAGNOSIS — Z0000 Encounter for general adult medical examination without abnormal findings: Secondary | ICD-10-CM

## 2013-04-24 DIAGNOSIS — Z23 Encounter for immunization: Secondary | ICD-10-CM

## 2013-04-24 DIAGNOSIS — J309 Allergic rhinitis, unspecified: Secondary | ICD-10-CM

## 2013-04-24 DIAGNOSIS — Z2911 Encounter for prophylactic immunotherapy for respiratory syncytial virus (RSV): Secondary | ICD-10-CM

## 2013-04-24 LAB — COMPREHENSIVE METABOLIC PANEL
BUN: 15 mg/dL (ref 6–23)
CO2: 31 mEq/L (ref 19–32)
Calcium: 9 mg/dL (ref 8.4–10.5)
Chloride: 104 mEq/L (ref 96–112)
Creatinine, Ser: 0.7 mg/dL (ref 0.4–1.5)
GFR: 131.28 mL/min (ref >60.00)
Total Bilirubin: 1.5 mg/dL — ABNORMAL HIGH (ref 0.3–1.2)

## 2013-04-24 LAB — CBC WITH DIFFERENTIAL/PLATELET
Basophils Absolute: 0 10*3/uL (ref 0.0–0.1)
Basophils Relative: 0.8 % (ref 0.0–3.0)
Eosinophils Absolute: 0.1 10*3/uL (ref 0.0–0.7)
MCHC: 34.6 g/dL (ref 30.0–36.0)
MCV: 90 fl (ref 78.0–100.0)
Monocytes Absolute: 0.3 10*3/uL (ref 0.1–1.0)
Neutrophils Relative %: 58.6 % (ref 43.0–77.0)
Platelets: 196 10*3/uL (ref 150.0–400.0)
RDW: 12.8 % (ref 11.5–14.6)

## 2013-04-24 LAB — LIPID PANEL
Cholesterol: 203 mg/dL — ABNORMAL HIGH (ref 0–200)
HDL: 48.8 mg/dL (ref >39.00)
Triglycerides: 93 mg/dL (ref 0.0–149.0)
VLDL: 18.6 mg/dL (ref 0.0–40.0)

## 2013-04-24 NOTE — Assessment & Plan Note (Signed)
Td 2013 FLU SHOT today Zostavax-- discussed , will get today Colonoscopy Dr Posey Rea, first Cscope 16109 (polyp) , then  01/02/2008 (tubular adenoma), was rec by GI to have  next colonoscopy 07-2014 PSA per urology Doing great with diet, exercising less  Labs  Chronic medical problems management by other doctors, BP today great

## 2013-04-24 NOTE — Progress Notes (Signed)
  Subjective:    Patient ID: Erik [name], male    DOB: January 05, 1956, 57 y.o.   MRN: [id number]  HPI CPX    Past Medical History  Diagnosis Date  . ALLERGIC RHINITIS 12/19/2009  . HYPOGONADISM, MALE 08/11/2007    f/u by Dr Cleatrice Burke urology    . OSTEOPOROSIS, IDIOPATHIC 08/11/2007    f/u at [facility] , now improved to osteopenia  . Raynaud's disease /phenomenon 08/16/2012  . Back pain chronic     On Neurontin  . Blepharitis     B, chronic  . Chronic prostatitis      f/u by urology, has a prostate massage prn  . BPH (benign prostatic hyperplasia)   . Varicose veins   . Rosacea    Past Surgical History  Procedure Laterality Date  . Cervical fusion  1994    C4-C5-C6 fusion  . Transurethral resection of prostate  07-2009  . Blepharoplasty      B blepharoplasty @ [facility] 08-2011   History   Social History  . Marital Status: Married    Spouse Name: N/A    Number of Children: 3  . Years of Education: N/A   Occupational History  . Scientist, water quality for [employer]      Social History Main Topics  . Smoking status: Never Smoker   . Smokeless tobacco: Not on file  . Alcohol Use: No  . Drug Use: No  . Sexual Activity: Not on file   Other Topics Concern  . Not on file   Social History Narrative   Lives with wife.  They have three children.   Daughter is an Photographer     Family History  Problem Relation Age of Onset  . COPD Father     Died, 18  . Diabetes Mother     M, GP  . Hypertension Mother   . CAD Mother     F, M, GP  . Hypertension Sister   . Fibromyalgia Sister   . Hyperlipidemia Sister   . Colon cancer Neg Hx   . Prostate cancer Neg Hx       Review of Systems Diet, exercise-- still does [employer], less exercise less d/t work schedule and knee pain (to see a specialist) Tired, stressed , working 60-h/weeks sometimes  No  CP, SOB, lower extremity edema Denies  nausea, vomiting diarrhea Denies  blood in the stools (-) cough, sputum production, chest  congestion Allergies:  still has PND despite claritin, occ cough No anxiety, depression       Objective:   Physical Exam BP 122/64  Pulse 63  Temp(Src) 97.6 F (36.4 C)  Ht 6\' 1"  (1.854 m)  Wt 188 lb (85.276 kg)  BMI 24.81 kg/m2  SpO2 99%  General -- alert, well-developed, NAD.  Neck --no thyromegaly Lungs -- normal respiratory effort, no intercostal retractions, no accessory muscle use, and normal breath sounds.  Heart-- normal rate, regular rhythm, no murmur.  Abdomen-- Not distended, good bowel sounds,soft, non-tender. Extremities-- no pretibial edema bilaterally  Neurologic--  alert & oriented X3. Speech normal, gait normal, strength normal in all extremities.    Psych-- Cognition and judgment appear intact. Cooperative with normal attention span and concentration. No anxious appearing , no depressed appearing.      Assessment & Plan:

## 2013-04-24 NOTE — Patient Instructions (Signed)
Get your blood work before you leave  Next visit in  1 year  for a physical exam    Check the  blood pressure 2 or 3 times a month  be sure it is between 110/60 and 140/85. Ideal blood pressure is 120/80. If it is consistently higher or lower, let me know

## 2013-04-24 NOTE — Assessment & Plan Note (Signed)
Currently on Claritin and Flonase as needed, symptoms not well-controlled. Recommend to switch to zyrtec and Qnsal (sample provided). Will call for a Qnsal prescription if that works better

## 2013-04-26 ENCOUNTER — Other Ambulatory Visit: Payer: BC Managed Care – PPO

## 2013-04-26 DIAGNOSIS — G5730 Lesion of lateral popliteal nerve, unspecified lower limb: Secondary | ICD-10-CM | POA: Insufficient documentation

## 2013-05-29 ENCOUNTER — Other Ambulatory Visit: Payer: Self-pay | Admitting: Family Medicine

## 2013-05-29 NOTE — Telephone Encounter (Signed)
Last seen 04/24/13 and filled 03/21/13 #30 UDS on contract LOW- 11/11/12  Please advise       KP

## 2013-06-06 ENCOUNTER — Ambulatory Visit
Admission: RE | Admit: 2013-06-06 | Discharge: 2013-06-06 | Disposition: A | Discharge status: Home / Self Care | Payer: BC Managed Care – PPO | Source: Ambulatory Visit | Attending: Diagnostic Radiology | Admitting: Diagnostic Radiology

## 2013-06-06 DIAGNOSIS — I83812 Varicose veins of left lower extremities with pain: Secondary | ICD-10-CM

## 2013-06-19 ENCOUNTER — Other Ambulatory Visit (HOSPITAL_COMMUNITY): Payer: Self-pay | Admitting: Diagnostic Radiology

## 2013-06-19 DIAGNOSIS — I83813 Varicose veins of bilateral lower extremities with pain: Secondary | ICD-10-CM

## 2013-07-10 ENCOUNTER — Encounter: Payer: Self-pay | Admitting: Internal Medicine

## 2013-07-10 ENCOUNTER — Other Ambulatory Visit: Payer: Self-pay | Admitting: Internal Medicine

## 2013-07-10 DIAGNOSIS — Z0000 Encounter for general adult medical examination without abnormal findings: Secondary | ICD-10-CM

## 2013-07-11 ENCOUNTER — Ambulatory Visit
Admission: RE | Admit: 2013-07-11 | Discharge: 2013-07-11 | Disposition: A | Discharge status: Home / Self Care | Payer: BC Managed Care – PPO | Source: Ambulatory Visit | Attending: Diagnostic Radiology | Admitting: Diagnostic Radiology

## 2013-07-11 DIAGNOSIS — I83813 Varicose veins of bilateral lower extremities with pain: Secondary | ICD-10-CM

## 2013-07-20 ENCOUNTER — Ambulatory Visit
Admission: RE | Admit: 2013-07-20 | Discharge: 2013-07-20 | Disposition: A | Discharge status: Home / Self Care | Payer: BC Managed Care – PPO | Source: Ambulatory Visit | Attending: Diagnostic Radiology | Admitting: Diagnostic Radiology

## 2013-07-20 ENCOUNTER — Other Ambulatory Visit (HOSPITAL_COMMUNITY): Payer: Self-pay | Admitting: Diagnostic Radiology

## 2013-07-20 DIAGNOSIS — I83813 Varicose veins of bilateral lower extremities with pain: Secondary | ICD-10-CM

## 2013-08-15 ENCOUNTER — Ambulatory Visit: Payer: BC Managed Care – PPO

## 2013-08-15 ENCOUNTER — Other Ambulatory Visit: Payer: BC Managed Care – PPO

## 2013-08-30 ENCOUNTER — Ambulatory Visit
Admission: RE | Admit: 2013-08-30 | Discharge: 2013-08-30 | Disposition: A | Discharge status: Home / Self Care | Payer: BC Managed Care – PPO | Source: Ambulatory Visit | Attending: Diagnostic Radiology | Admitting: Diagnostic Radiology

## 2013-08-30 DIAGNOSIS — I83813 Varicose veins of bilateral lower extremities with pain: Secondary | ICD-10-CM

## 2013-09-13 ENCOUNTER — Telehealth: Payer: Self-pay | Admitting: Emergency Medicine

## 2013-09-13 NOTE — Telephone Encounter (Signed)
  Pt called w/ c/c of swelling in calf and ankle- he is 2wks post LLE sclero.   The swelling is worse now than has been post procedure.  Its not hot, tender, and he able to walk.   Told pt to keep wearing the compression stocking and begin 600mg  IBU q6hrs w/ food.  Told him to call back to let us know if there is any change with the swelling.

## 2013-09-25 ENCOUNTER — Encounter: Payer: Self-pay | Admitting: Internal Medicine

## 2014-01-23 ENCOUNTER — Other Ambulatory Visit (HOSPITAL_COMMUNITY): Payer: Self-pay | Admitting: Diagnostic Radiology

## 2014-01-23 DIAGNOSIS — I8390 Asymptomatic varicose veins of unspecified lower extremity: Secondary | ICD-10-CM

## 2014-02-12 DIAGNOSIS — H353190 Nonexudative age-related macular degeneration, unspecified eye, stage unspecified: Secondary | ICD-10-CM | POA: Insufficient documentation

## 2014-02-13 ENCOUNTER — Other Ambulatory Visit: Payer: Self-pay | Admitting: Internal Medicine

## 2014-02-13 ENCOUNTER — Encounter: Payer: Self-pay | Admitting: Internal Medicine

## 2014-02-13 ENCOUNTER — Ambulatory Visit (HOSPITAL_BASED_OUTPATIENT_CLINIC_OR_DEPARTMENT_OTHER)
Admission: RE | Admit: 2014-02-13 | Discharge: 2014-02-13 | Disposition: A | Discharge status: Home / Self Care | Payer: BC Managed Care – PPO | Source: Ambulatory Visit | Attending: Internal Medicine | Admitting: Internal Medicine

## 2014-02-13 ENCOUNTER — Ambulatory Visit
Admission: RE | Admit: 2014-02-13 | Discharge: 2014-02-13 | Disposition: A | Discharge status: Home / Self Care | Payer: BC Managed Care – PPO | Source: Ambulatory Visit | Attending: Diagnostic Radiology | Admitting: Diagnostic Radiology

## 2014-02-13 ENCOUNTER — Ambulatory Visit (INDEPENDENT_AMBULATORY_CARE_PROVIDER_SITE_OTHER): Payer: BC Managed Care – PPO | Admitting: Internal Medicine

## 2014-02-13 VITALS — BP 106/69 | HR 80 | Temp 97.9°F | Wt 192.0 lb

## 2014-02-13 DIAGNOSIS — M19039 Primary osteoarthritis, unspecified wrist: Secondary | ICD-10-CM | POA: Insufficient documentation

## 2014-02-13 DIAGNOSIS — M25539 Pain in unspecified wrist: Secondary | ICD-10-CM

## 2014-02-13 DIAGNOSIS — M25532 Pain in left wrist: Secondary | ICD-10-CM

## 2014-02-13 DIAGNOSIS — I8390 Asymptomatic varicose veins of unspecified lower extremity: Secondary | ICD-10-CM

## 2014-02-13 LAB — URIC ACID: Uric Acid, Serum: 4 mg/dL (ref 4.0–7.8)

## 2014-02-13 LAB — SEDIMENTATION RATE: Sed Rate: 8 mm/hr (ref 0–22)

## 2014-02-13 MED ORDER — PREDNISONE 10 MG PO TABS: ORAL_TABLET | ORAL | Status: DC

## 2014-02-13 NOTE — Progress Notes (Signed)
Subjective:    Patient ID: Erik [name], male    DOB: 06-May-1956, 58 y.o.   MRN: [id number]  DOS:  02/13/2014 Type of visit - description: acute History: About 2 weeks ago developed bilateral wrist pain, suddenly, the left one was swollen as well. Denies any injury, no history of previous gout. Overall pain has decrease, swelling is less. Also for several weeks has on and off right index burning pain with some radiation to the elbow, increase w/ use and decrease with rest.  Denies any injury but admits  he types in the computer and uses a mouse several hours a day.  When asked about new medications, he started Actonel earlier this month.  ROS No fever or chills, no rash. Has chronic neck pain, not worse Denies any other joint aches, specifically no pain in the fingers, knuckles,  elbows, knees   Past Medical History  Diagnosis Date  . ALLERGIC RHINITIS 12/19/2009  . HYPOGONADISM, MALE 08/11/2007    f/u by Dr Thomasene Mohair urology    . OSTEOPOROSIS, IDIOPATHIC 08/11/2007    f/u at [facility] , now improved to osteopenia  . Raynaud's disease /phenomenon 08/16/2012  . Back pain chronic     On Neurontin  . Blepharitis     B, chronic  . Chronic prostatitis      f/u by urology, has a prostate massage prn  . BPH (benign prostatic hyperplasia)   . Varicose veins   . Rosacea     Past Surgical History  Procedure Laterality Date  . Cervical fusion  1994    C4-C5-C6 fusion  . Transurethral resection of prostate  07-2009  . Blepharoplasty      B blepharoplasty @ [facility] 08-2011    History   Social History  . Marital Status: Married    Spouse Name: N/A    Number of Children: 3  . Years of Education: N/A   Occupational History  . Media planner for [employer]      Social History Main Topics  . Smoking status: Never Smoker   . Smokeless tobacco: Not on file  . Alcohol Use: No  . Drug Use: No  . Sexual Activity: Not on file   Other Topics Concern  . Not on file   Social  History Narrative   Lives with wife.  They have three children.   Daughter is an anesthesist          Medication List       This list is accurate as of: 02/13/14  5:02 PM.  Always use your most recent med list.               AMLODIPINE BESYLATE PO  Take 5 mg by mouth 2 (two) times daily.     celecoxib 200 MG capsule  Commonly known as:  CELEBREX  Take 200 mg by mouth daily.     fluticasone 50 MCG/ACT nasal spray  Commonly known as:  FLONASE  USE TWO SPRAYS INTO EACHNOSTRIL DAILY PRN     gabapentin 400 MG capsule  Commonly known as:  NEURONTIN  Take 400 mg by mouth 2 (two) times daily.     HYDROcodone-acetaminophen 5-325 MG per tablet  Commonly known as:  NORCO/VICODIN  TAKE ONE (1) TABLET BY MOUTH EVERY 6 HOURS AS NEEDED FOR PAIN     loratadine 10 MG tablet  Commonly known as:  CLARITIN  Take 10 mg by mouth daily.     minocycline 50 MG capsule  Commonly known  as:  MINOCIN,DYNACIN     omeprazole 20 MG capsule  Commonly known as:  PRILOSEC  Take 20 mg by mouth daily.     predniSONE 10 MG tablet  Commonly known as:  DELTASONE  3 tabs x 3 days, 2 tabs x 3 days, 1 tab x 3 days     risedronate 150 MG tablet  Commonly known as:  ACTONEL     tadalafil 5 MG tablet  Commonly known as:  CIALIS  Take 5 mg by mouth daily.     testosterone cypionate 100 MG/ML injection  Commonly known as:  DEPOTESTOTERONE CYPIONATE  Inject into the muscle every 14 (fourteen) days. For IM use only           Objective:   Physical Exam BP 106/69  Pulse 80  Temp(Src) 97.9 F (36.6 C)  Wt 192 lb (87.091 kg)  SpO2 99%  General -- alert, well-developed, NAD.   Extremities--  R wrist-hand wnl L wrist-- slt puffy, + TTP, ROM normal, no red-warm L fingers normal Normal radial pulses B  Neurologic--  alert & oriented X3. Speech normal, gait appropriate for age, strength symmetric and appropriate for age.  Psych-- Cognition and judgment appear intact. Cooperative with normal  attention span and concentration. No anxious or depressed appearing.       Assessment & Plan:    Wrist  pain, Bilaterally wrist pain and left wrsit swelling of a sudden onset. Gout? Overuse? Side effects from medications such as Actonel?. He also has osteoporosis and  at risk for fractures. Plan: Sedimentation rate, uric acid, prednisone, x-ray. If not better he will let me know  Also has pain at the right index, tendinitis? We'll see how he does after prednisone.

## 2014-02-13 NOTE — Progress Notes (Signed)
Pre visit review using our clinic review tool, if applicable. No additional management support is needed unless otherwise documented below in the visit note. 

## 2014-02-13 NOTE — Patient Instructions (Addendum)
Labs  Get the XR at [facility], corner of [address] (10 minutes form here); they are open 24/7 [place], [place] [address] [phone]   Take medicines as prescribed Call if no better gradually in few days   Next visit by 04-2014 for a physical

## 2014-02-28 DIAGNOSIS — K219 Gastro-esophageal reflux disease without esophagitis: Secondary | ICD-10-CM | POA: Insufficient documentation

## 2014-02-28 DIAGNOSIS — K602 Anal fissure, unspecified: Secondary | ICD-10-CM | POA: Insufficient documentation

## 2014-02-28 DIAGNOSIS — N411 Chronic prostatitis: Secondary | ICD-10-CM | POA: Insufficient documentation

## 2014-02-28 DIAGNOSIS — R339 Retention of urine, unspecified: Secondary | ICD-10-CM | POA: Insufficient documentation

## 2014-02-28 DIAGNOSIS — R350 Frequency of micturition: Secondary | ICD-10-CM | POA: Insufficient documentation

## 2014-02-28 DIAGNOSIS — R972 Elevated prostate specific antigen [PSA]: Secondary | ICD-10-CM | POA: Insufficient documentation

## 2014-02-28 DIAGNOSIS — M8580 Other specified disorders of bone density and structure, unspecified site: Secondary | ICD-10-CM | POA: Insufficient documentation

## 2014-02-28 DIAGNOSIS — M6740 Ganglion, unspecified site: Secondary | ICD-10-CM | POA: Insufficient documentation

## 2014-04-04 ENCOUNTER — Ambulatory Visit: Payer: 59 | Admitting: Physical Therapy

## 2014-04-11 ENCOUNTER — Ambulatory Visit: Payer: 59 | Attending: Sports Medicine | Admitting: Physical Therapy

## 2014-04-11 DIAGNOSIS — M25561 Pain in right knee: Secondary | ICD-10-CM | POA: Diagnosis not present

## 2014-04-11 DIAGNOSIS — Z5189 Encounter for other specified aftercare: Secondary | ICD-10-CM | POA: Diagnosis present

## 2014-04-11 DIAGNOSIS — M7631 Iliotibial band syndrome, right leg: Secondary | ICD-10-CM | POA: Insufficient documentation

## 2014-04-16 ENCOUNTER — Ambulatory Visit: Payer: 59 | Admitting: Rehabilitation

## 2014-04-16 DIAGNOSIS — Z5189 Encounter for other specified aftercare: Secondary | ICD-10-CM | POA: Diagnosis not present

## 2014-04-23 ENCOUNTER — Ambulatory Visit: Payer: 59 | Admitting: Physical Therapy

## 2014-04-23 DIAGNOSIS — Z5189 Encounter for other specified aftercare: Secondary | ICD-10-CM | POA: Diagnosis not present

## 2014-04-25 ENCOUNTER — Ambulatory Visit: Payer: 59 | Admitting: Physical Therapy

## 2014-04-25 DIAGNOSIS — Z5189 Encounter for other specified aftercare: Secondary | ICD-10-CM | POA: Diagnosis not present

## 2014-05-01 ENCOUNTER — Ambulatory Visit: Payer: 59 | Admitting: Rehabilitation

## 2014-05-01 DIAGNOSIS — Z5189 Encounter for other specified aftercare: Secondary | ICD-10-CM | POA: Diagnosis not present

## 2014-05-03 ENCOUNTER — Ambulatory Visit: Payer: 59

## 2014-05-04 ENCOUNTER — Ambulatory Visit: Payer: 59 | Admitting: Rehabilitation

## 2014-05-08 ENCOUNTER — Ambulatory Visit: Payer: 59

## 2014-05-08 ENCOUNTER — Ambulatory Visit (INDEPENDENT_AMBULATORY_CARE_PROVIDER_SITE_OTHER): Payer: 59

## 2014-05-08 ENCOUNTER — Ambulatory Visit: Payer: 59 | Attending: Sports Medicine | Admitting: Physical Therapy

## 2014-05-08 DIAGNOSIS — Z23 Encounter for immunization: Secondary | ICD-10-CM

## 2014-05-08 DIAGNOSIS — Z5189 Encounter for other specified aftercare: Secondary | ICD-10-CM | POA: Diagnosis present

## 2014-05-08 DIAGNOSIS — M7631 Iliotibial band syndrome, right leg: Secondary | ICD-10-CM | POA: Insufficient documentation

## 2014-05-08 DIAGNOSIS — M25561 Pain in right knee: Secondary | ICD-10-CM | POA: Diagnosis not present

## 2014-05-11 ENCOUNTER — Ambulatory Visit: Payer: 59 | Admitting: Rehabilitation

## 2014-05-12 ENCOUNTER — Emergency Department (INDEPENDENT_AMBULATORY_CARE_PROVIDER_SITE_OTHER): Payer: 59

## 2014-05-12 ENCOUNTER — Emergency Department
Admission: EM | Admit: 2014-05-12 | Discharge: 2014-05-12 | Disposition: A | Discharge status: Home / Self Care | Payer: 59 | Source: Home / Self Care

## 2014-05-12 DIAGNOSIS — R0781 Pleurodynia: Secondary | ICD-10-CM

## 2014-05-12 DIAGNOSIS — R52 Pain, unspecified: Secondary | ICD-10-CM

## 2014-05-12 MED ORDER — TRAMADOL-ACETAMINOPHEN 37.5-325 MG PO TABS: 1.0000 | ORAL_TABLET | Freq: Four times a day (QID) | ORAL | Status: DC | PRN

## 2014-05-12 NOTE — ED Notes (Signed)
Patient states fell on counters at house while working, states landed on rib area right side and heard a "pop" sound, pain is 4 but worse with movement

## 2014-05-12 NOTE — ED Provider Notes (Signed)
CSN: [id number]     Arrival date & time 05/12/14  1631 History   None    Chief Complaint  Patient presents with  . Rib Injury    right    HPI  Rib pain x1 day  States that he was leaning over on R side doing housework, stretched out and felt pop along R mid-lateral ribs  Has had persistent R rib pain since this point.  Worse with movement, deep breathing  Baseline hx/o osteopenia On actonel  No SOB   Past Medical History  Diagnosis Date  . ALLERGIC RHINITIS 12/19/2009  . HYPOGONADISM, MALE 08/11/2007    f/u by Dr Thomasene Mohair urology    . OSTEOPOROSIS, IDIOPATHIC 08/11/2007    f/u at [facility] , now improved to osteopenia  . Raynaud's disease /phenomenon 08/16/2012  . Back pain chronic     On Neurontin  . Blepharitis     B, chronic  . Chronic prostatitis      f/u by urology, has a prostate massage prn  . BPH (benign prostatic hyperplasia)   . Varicose veins   . Rosacea    Past Surgical History  Procedure Laterality Date  . Cervical fusion  1994    C4-C5-C6 fusion  . Transurethral resection of prostate  07-2009  . Blepharoplasty      B blepharoplasty @ [facility] 08-2011   Family History  Problem Relation Age of Onset  . COPD Father     Died, 71  . Diabetes Mother     M, GP  . Hypertension Mother   . CAD Mother     F, M, GP  . Hypertension Sister   . Fibromyalgia Sister   . Hyperlipidemia Sister   . Colon cancer Neg Hx   . Prostate cancer Neg Hx    History  Substance Use Topics  . Smoking status: Never Smoker   . Smokeless tobacco: Not on file  . Alcohol Use: No    Review of Systems  All other systems reviewed and are negative.   Allergies  Sulfa drugs cross reactors  Home Medications   Prior to Admission medications   Medication Sig Start Date End Date Taking? Authorizing Provider  AMLODIPINE BESYLATE PO Take 5 mg by mouth 2 (two) times daily.    Historical Provider, MD  celecoxib (CELEBREX) 200 MG capsule Take 200 mg by mouth daily.    Historical Provider, MD    fluticasone (FLONASE) 50 MCG/ACT nasal spray USE TWO SPRAYS INTO EACHNOSTRIL DAILY PRN 04/24/11   Colon Branch, MD  gabapentin (NEURONTIN) 400 MG capsule Take 400 mg by mouth 2 (two) times daily.     Historical Provider, MD  HYDROcodone-acetaminophen (NORCO/VICODIN) 5-325 MG per tablet TAKE ONE (1) TABLET BY MOUTH EVERY 6 HOURS AS NEEDED FOR PAIN 03/21/13   Rosalita Chessman, DO  loratadine (CLARITIN) 10 MG tablet Take 10 mg by mouth daily.    Historical Provider, MD  minocycline (MINOCIN,DYNACIN) 50 MG capsule  02/12/14   Historical Provider, MD  omeprazole (PRILOSEC) 20 MG capsule Take 20 mg by mouth daily.    Historical Provider, MD  predniSONE (DELTASONE) 10 MG tablet 3 tabs x 3 days, 2 tabs x 3 days, 1 tab x 3 days 02/13/14   Colon Branch, MD  risedronate (ACTONEL) 150 MG tablet  01/23/14   Historical Provider, MD  tadalafil (CIALIS) 5 MG tablet Take 5 mg by mouth daily.     Historical Provider, MD  testosterone cypionate (DEPOTESTOTERONE CYPIONATE) 100  MG/ML injection Inject into the muscle every 14 (fourteen) days. For IM use only    Historical Provider, MD   BP 132/82 mmHg  Pulse 79  Temp(Src) 97.8 F (36.6 C) (Oral)  Wt 195 lb (88.451 kg)  SpO2 99% Physical Exam  Constitutional: He appears well-developed and well-nourished.  HENT:  Head: Normocephalic and atraumatic.  Eyes: Pupils are equal, round, and reactive to light.  Neck: Normal range of motion.  Cardiovascular: Normal rate and regular rhythm.   Pulmonary/Chest: Effort normal and breath sounds normal.  Abdominal: Soft.  Musculoskeletal: Normal range of motion.       Arms: + TTP over affected area    Neurological: He is alert.  Skin: Skin is warm.    ED Course  Procedures (including critical care time) Labs Review Labs Reviewed - No data to display  Imaging Review Dg Ribs Unilateral W/chest Right  05/12/2014   CLINICAL DATA:  Right anterior rib pain  EXAM: RIGHT RIBS AND CHEST - 3+ VIEW  COMPARISON:  Chest radiographs  dated 04/20/2012  FINDINGS: Lungs are essentially clear. Mild left basilar opacity, likely atelectasis. No pleural effusion or pneumothorax.  The heart is normal in size.  Cervical spine fixation hardware.  No displaced right rib fracture is seen.  IMPRESSION: No evidence of acute cardiopulmonary disease.  No displaced right rib fracture is seen.   Electronically Signed   By: Julian Hy M.D.   On: 05/12/2014 17:35     MDM   1. Rib pain   2. Pain    xrays negative for fracture  Likely ligamentous/connective tissue injury  Cont celebrex  ultracet prn for pain  IS at home as to prevent atelectasis  Discussed general and MSK red flags Follow up as needed.     The patient and/or caregiver has been counseled thoroughly with regard to treatment plan and/or medications prescribed including dosage, schedule, interactions, rationale for use, and possible side effects and they verbalize understanding. Diagnoses and expected course of recovery discussed and will return if not improved as expected or if the condition worsens. Patient and/or caregiver verbalized understanding.         Shanda Howells, MD 05/12/14 [phone]

## 2014-05-15 ENCOUNTER — Ambulatory Visit: Payer: 59 | Admitting: Rehabilitation

## 2014-05-15 DIAGNOSIS — Z5189 Encounter for other specified aftercare: Secondary | ICD-10-CM | POA: Diagnosis not present

## 2014-05-17 ENCOUNTER — Encounter: Payer: Self-pay | Admitting: Internal Medicine

## 2014-05-17 ENCOUNTER — Ambulatory Visit (INDEPENDENT_AMBULATORY_CARE_PROVIDER_SITE_OTHER): Payer: 59 | Admitting: Internal Medicine

## 2014-05-17 VITALS — BP 119/74 | HR 84 | Temp 97.6°F | Wt 193.5 lb

## 2014-05-17 DIAGNOSIS — M542 Cervicalgia: Secondary | ICD-10-CM

## 2014-05-17 DIAGNOSIS — I7300 Raynaud's syndrome without gangrene: Secondary | ICD-10-CM

## 2014-05-17 DIAGNOSIS — Z0000 Encounter for general adult medical examination without abnormal findings: Secondary | ICD-10-CM

## 2014-05-17 DIAGNOSIS — G894 Chronic pain syndrome: Secondary | ICD-10-CM

## 2014-05-17 LAB — COMPREHENSIVE METABOLIC PANEL
ALBUMIN: 3.4 g/dL — AB (ref 3.5–5.2)
ALT: 24 U/L (ref 0–53)
AST: 32 U/L (ref 0–37)
Alkaline Phosphatase: 64 U/L (ref 39–117)
BUN: 18 mg/dL (ref 6–23)
CALCIUM: 8.6 mg/dL (ref 8.4–10.5)
CHLORIDE: 105 meq/L (ref 96–112)
CO2: 23 mEq/L (ref 19–32)
Creatinine, Ser: 0.7 mg/dL (ref 0.4–1.5)
GFR: 133.13 mL/min (ref >60.00)
GLUCOSE: 90 mg/dL (ref 70–99)
Potassium: 4 mEq/L (ref 3.5–5.1)
Sodium: 139 mEq/L (ref 135–145)
Total Bilirubin: 1.8 mg/dL — ABNORMAL HIGH (ref 0.2–1.2)
Total Protein: 6.3 g/dL (ref 6.0–8.3)

## 2014-05-17 LAB — LIPID PANEL
CHOL/HDL RATIO: 6
Cholesterol: 215 mg/dL — ABNORMAL HIGH (ref 0–200)
HDL: 37.4 mg/dL — ABNORMAL LOW (ref >39.00)
LDL Cholesterol: 167 mg/dL — ABNORMAL HIGH (ref 0–99)
NonHDL: 177.6
TRIGLYCERIDES: 51 mg/dL (ref 0.0–149.0)
VLDL: 10.2 mg/dL (ref 0.0–40.0)

## 2014-05-17 MED ORDER — HYDROCODONE-ACETAMINOPHEN 5-325 MG PO TABS: 1.0000 | ORAL_TABLET | Freq: Four times a day (QID) | ORAL | Status: DC | PRN

## 2014-05-17 MED ORDER — CYCLOBENZAPRINE HCL 10 MG PO TABS: 10.0000 mg | ORAL_TABLET | Freq: Every evening | ORAL | Status: DC | PRN

## 2014-05-17 NOTE — Assessment & Plan Note (Signed)
Per Dr. Charlestine Night, on amlodipine as needed

## 2014-05-17 NOTE — Progress Notes (Signed)
Subjective:    Patient ID: Erik [name], male    DOB: March 10, 1956, 58 y.o.   MRN: [id number]  DOS:  05/17/2014 Type of visit - description : cpx Interval history: Was recently seen at the ER with rib pain, was getting better until yesterday when he had a mechanical fall and landed backwards on his right side. Since then the pain has increased to some extent, he has chronic neck-back pain which is a slightly worse, more "stiff in the neck". No upper or lower  paresthesias.  ROS  No  CP, SOB No palpitations, no lower extremity edema Denies  nausea, vomiting diarrhea, blood in the stools (-) cough, sputum production (-) wheezing, chest congestion No dysuria, gross hematuria   No anxiety, depression    Past Medical History  Diagnosis Date  . ALLERGIC RHINITIS 12/19/2009  . HYPOGONADISM, MALE 08/11/2007    f/u by Dr Thomasene Mohair urology    . OSTEOPOROSIS, IDIOPATHIC 08/11/2007    f/u at [facility] , now improved to osteopenia  . Raynaud's disease /phenomenon 08/16/2012  . Back pain chronic     On hydrocodone-Neurontin  . Blepharitis     B, chronic  . Chronic prostatitis      f/u by urology, has a prostate massage prn  . BPH (benign prostatic hyperplasia)   . Varicose veins   . Rosacea    Family History  Problem Relation Age of Onset  . COPD Father     Died, 71  . Diabetes Mother     M, GP  . Hypertension Mother   . CAD Mother     F, M, GP  . Hypertension Sister   . Fibromyalgia Sister   . Hyperlipidemia Sister   . Colon cancer Neg Hx   . Prostate cancer Neg Hx      Past Surgical History  Procedure Laterality Date  . Cervical fusion  1994    C4-C5-C6 fusion  . Transurethral resection of prostate  07-2009  . Blepharoplasty      B blepharoplasty @ [facility] 08-2011    History   Social History  . Marital Status: Married    Spouse Name: N/A    Number of Children: 3  . Years of Education: N/A   Occupational History  . Media planner for [employer]      Social  History Main Topics  . Smoking status: Never Smoker   . Smokeless tobacco: Not on file  . Alcohol Use: No  . Drug Use: No  . Sexual Activity: Not on file   Other Topics Concern  . Not on file   Social History Narrative   Lives with wife.  They have three children.   Daughter is an Ship broker          Medication List       This list is accurate as of: 05/17/14  2:38 PM.  Always use your most recent med list.               amLODipine 5 MG tablet  Commonly known as:  NORVASC  Take 5 mg by mouth daily. Prn Reynaud's     celecoxib 200 MG capsule  Commonly known as:  CELEBREX  Take 200 mg by mouth daily.     cyclobenzaprine 10 MG tablet  Commonly known as:  FLEXERIL  Take 1 tablet (10 mg total) by mouth at bedtime as needed for muscle spasms.     fluticasone 50 MCG/ACT nasal spray  Commonly known as:  FLONASE  USE TWO SPRAYS INTO EACHNOSTRIL DAILY PRN     gabapentin 400 MG capsule  Commonly known as:  NEURONTIN  Take 400 mg by mouth 2 (two) times daily.     HYDROcodone-acetaminophen 5-325 MG per tablet  Commonly known as:  NORCO/VICODIN  Take 1 tablet by mouth every 6 (six) hours as needed for moderate pain.     loratadine 10 MG tablet  Commonly known as:  CLARITIN  Take 10 mg by mouth daily.     minocycline 50 MG capsule  Commonly known as:  MINOCIN,DYNACIN     omeprazole 20 MG capsule  Commonly known as:  PRILOSEC  Take 20 mg by mouth daily.     risedronate 150 MG tablet  Commonly known as:  ACTONEL     tadalafil 5 MG tablet  Commonly known as:  CIALIS  Take 5 mg by mouth daily.     testosterone cypionate 100 MG/ML injection  Commonly known as:  DEPOTESTOTERONE CYPIONATE  Inject into the muscle every 14 (fourteen) days. For IM use only           Objective:   Physical Exam BP 119/74 mmHg  Pulse 84  Temp(Src) 97.6 F (36.4 C) (Oral)  Wt 193 lb 8 oz (87.771 kg)  SpO2 99% General -- alert, well-developed, NAD.  Neck --no thyromegaly ,  normal carotid pulse  HEENT-- Not pale. Lungs -- normal respiratory effort, no intercostal retractions, no accessory muscle use, and normal breath sounds. TTP at the anterior right rib cage Heart-- normal rate, regular rhythm, no murmur.  Abdomen-- Not distended, good bowel sounds,soft, non-tender. Extremities-- no pretibial edema bilaterally  Neurologic--  alert & oriented X3. Speech normal, gait-posterior antalgic.  Psych-- Cognition and judgment appear intact. Cooperative with normal attention span and concentration. No anxious or depressed appearing.     Assessment & Plan:

## 2014-05-17 NOTE — Patient Instructions (Signed)
Get your blood work before you leave  Please get a UDS  Wean off Neurontin if possible  Flexeril at night, call if the pain is not better in 2-3 weeks    Please come back to the office -6 months  for a routine check up

## 2014-05-17 NOTE — Progress Notes (Signed)
Pre visit review using our clinic review tool, if applicable. No additional management support is needed unless otherwise documented below in the visit note. 

## 2014-05-17 NOTE — Assessment & Plan Note (Signed)
On hydrocodone daily. Also takes Neurontin, does not feel like it is helping much, recommend to wean off. Recently had a mechanical fall,will  prescribe Flexeril temporarily. If not better he will  let me know. UDS 11/2012 low risk, recheck a UDS today

## 2014-05-17 NOTE — Assessment & Plan Note (Addendum)
Td 2013 Had a FLU SHOT  Zostavax -- 2014  Colonoscopy Dr Waverly Ferrari, first Cscope [phone] (polyp) , then 01/02/2008 (tubular adenoma), was rec by GI to have next colonoscopy 07-2014  PSA per urology  Diet-- average exercising -- limited d/t knee pain, doing PT, gained wt counseled   Labs

## 2014-05-18 ENCOUNTER — Ambulatory Visit: Payer: 59 | Admitting: Rehabilitation

## 2014-05-18 DIAGNOSIS — Z5189 Encounter for other specified aftercare: Secondary | ICD-10-CM | POA: Diagnosis not present

## 2014-05-30 ENCOUNTER — Telehealth: Payer: Self-pay

## 2014-05-30 NOTE — Telephone Encounter (Signed)
UDS: 05/17/2014   Negative for Norco  Positive for Flexeril Positive for Tramadol Positive for Gabapentin  Low risk per Dr. Larose Kells (recheck every 6 months) 05/29/2014

## 2014-06-19 ENCOUNTER — Encounter: Payer: Self-pay | Admitting: Internal Medicine

## 2014-09-02 ENCOUNTER — Emergency Department
Admission: EM | Admit: 2014-09-02 | Discharge: 2014-09-02 | Disposition: A | Discharge status: Home / Self Care | Payer: 59 | Source: Home / Self Care | Attending: Family Medicine | Admitting: Family Medicine

## 2014-09-02 DIAGNOSIS — G894 Chronic pain syndrome: Secondary | ICD-10-CM | POA: Diagnosis not present

## 2014-09-02 DIAGNOSIS — B029 Zoster without complications: Secondary | ICD-10-CM | POA: Diagnosis not present

## 2014-09-02 MED ORDER — HYDROCODONE-ACETAMINOPHEN 5-325 MG PO TABS: 1.0000 | ORAL_TABLET | Freq: Four times a day (QID) | ORAL | Status: DC | PRN

## 2014-09-02 MED ORDER — VALACYCLOVIR HCL 1 G PO TABS: 1000.0000 mg | ORAL_TABLET | Freq: Three times a day (TID) | ORAL | Status: DC

## 2014-09-02 NOTE — ED Provider Notes (Signed)
CSN: [id number]     Arrival date & time 09/02/14  1101 History   First MD Initiated Contact with Patient 09/02/14 1134     Chief Complaint  Patient presents with  . Neck Pain    head and neck pain / right side      HPI Comments: Patient complains of onset of sharp tingling pain in his right posterior ear and neck about five or six days ago.  48 hours ago he developed a rash on his forehead and bridge of nose.  He feels well otherwise.  No fevers, chills, and sweats   Patient is a 59 y.o. male presenting with rash. The history is provided by the patient.  Rash Location:  Face Facial rash location:  Forehead Quality: painful and redness   Quality: not blistering, not bruising, not burning, not draining, not itchy, not swelling and not weeping   Pain details:    Quality:  Tingling and sharp   Severity:  Mild   Onset quality:  Gradual   Duration:  2 days   Timing:  Constant   Progression:  Worsening Severity:  Mild Onset quality:  Gradual Duration:  2 days Timing:  Constant Progression:  Worsening Chronicity:  New Context: not animal contact, not chemical exposure, not exposure to similar rash, not food, not insect bite/sting, not medications, not new detergent/soap and not plant contact   Relieved by:  Nothing Worsened by:  Nothing tried Ineffective treatments:  None tried Associated symptoms: no fatigue, no fever, no headaches, no joint pain, no myalgias, no nausea, no periorbital edema, no sore throat, no throat swelling, no tongue swelling and no URI     Past Medical History  Diagnosis Date  . ALLERGIC RHINITIS 12/19/2009  . HYPOGONADISM, MALE 08/11/2007    f/u by Dr Thomasene Mohair urology    . OSTEOPOROSIS, IDIOPATHIC 08/11/2007    f/u at [facility] [address] , now improved to osteopenia  . Raynaud's disease /phenomenon 08/16/2012  . Back pain chronic     On hydrocodone-Neurontin  . Blepharitis     B, chronic  . Chronic prostatitis      f/u by urology, has a prostate massage prn  . BPH (benign  prostatic hyperplasia)   . Varicose veins   . Rosacea    Past Surgical History  Procedure Laterality Date  . Cervical fusion  1994    C4-C5-C6 fusion  . Transurethral resection of prostate  07-2009  . Blepharoplasty      B blepharoplasty @ [facility] 08-2011   Family History  Problem Relation Age of Onset  . COPD Father     Died, 71  . Diabetes Mother     M, GP  . Hypertension Mother   . CAD Mother     F, M, GP  . Hypertension Sister   . Fibromyalgia Sister   . Hyperlipidemia Sister   . Colon cancer Neg Hx   . Prostate cancer Neg Hx    History  Substance Use Topics  . Smoking status: Never Smoker   . Smokeless tobacco: Not on file  . Alcohol Use: No    Review of Systems  Constitutional: Negative for fever and fatigue.  HENT: Negative for sore throat.   Eyes: Negative for photophobia, pain, discharge, redness, itching and visual disturbance.  Gastrointestinal: Negative for nausea.  Musculoskeletal: Negative for myalgias and arthralgias.  Skin: Positive for rash.  Neurological: Negative for headaches.  All other systems reviewed and are negative.   Allergies  Sulfa drugs cross  reactors  Home Medications   Prior to Admission medications   Medication Sig Start Date End Date Taking? Authorizing Provider  amLODipine (NORVASC) 5 MG tablet Take 5 mg by mouth daily. Prn Reynaud's    Historical Provider, MD  celecoxib (CELEBREX) 200 MG capsule Take 200 mg by mouth daily.    Historical Provider, MD  cyclobenzaprine (FLEXERIL) 10 MG tablet Take 1 tablet (10 mg total) by mouth at bedtime as needed for muscle spasms. 05/17/14   Colon Branch, MD  fluticasone [facility]) 50 MCG/ACT nasal spray USE TWO SPRAYS INTO EACHNOSTRIL DAILY PRN 04/24/11   Colon Branch, MD  gabapentin (NEURONTIN) 400 MG capsule Take 400 mg by mouth 2 (two) times daily.     Historical Provider, MD  HYDROcodone-acetaminophen (NORCO/VICODIN) 5-325 MG per tablet Take 1 tablet by mouth every 6 (six) hours as needed for  moderate pain. 09/02/14   Kandra Nicolas, MD  loratadine (CLARITIN) 10 MG tablet Take 10 mg by mouth daily.    Historical Provider, MD  minocycline (MINOCIN,DYNACIN) 50 MG capsule  02/12/14   Historical Provider, MD  omeprazole (PRILOSEC) 20 MG capsule Take 20 mg by mouth daily.    Historical Provider, MD  risedronate (ACTONEL) 150 MG tablet  01/23/14   Historical Provider, MD  tadalafil (CIALIS) 5 MG tablet Take 5 mg by mouth daily.     Historical Provider, MD  testosterone cypionate (DEPOTESTOTERONE CYPIONATE) 100 MG/ML injection Inject into the muscle every 14 (fourteen) days. For IM use only    Historical Provider, MD  valACYclovir (VALTREX) 1000 MG tablet Take 1 tablet (1,000 mg total) by mouth 3 (three) times daily. 09/02/14   Kandra Nicolas, MD   BP 117/78 mmHg  Pulse 79  Temp(Src) 97.6 F (36.4 C) (Oral)  Ht 6' (1.829 m)  Wt 195 lb (88.451 kg)  BMI 26.44 kg/m2  SpO2 100% Physical Exam  Constitutional: He is oriented to person, place, and time. He appears well-developed and well-nourished. No distress.  HENT:  Head: Normocephalic.    Right Ear: External ear normal.  Left Ear: External ear normal.  Nose: Nose normal.  Mouth/Throat: Oropharynx is clear and moist.  Right temporal area, right forehead, and bridge of nose have herpetiform eruption as noted on diagram.  No lesions on tip of nose.    Eyes: Conjunctivae and EOM are normal. Pupils are equal, round, and reactive to light. Right eye exhibits no discharge. Left eye exhibits no discharge.  Neck: Neck supple.  Cardiovascular: Normal heart sounds.   Pulmonary/Chest: Breath sounds normal.  Abdominal: There is no tenderness.  Musculoskeletal: He exhibits no edema.  Lymphadenopathy:    He has no cervical adenopathy.  Neurological: He is alert and oriented to person, place, and time.  Skin: Skin is warm and dry.  Nursing note and vitals reviewed.   ED Course  Procedures   none    MDM   1. Herpes zoster        Begin Valtrex.  Lortab for pain Q6hr prn. May take Ibuprofen 200mg , 4 tabs every 8 hours with food as needed for pain.  If rash begins to appear on tip of nose, recommend evaluation by ophthalmologist. Followup with Family Doctor if not improved in one week.     Kandra Nicolas, MD 09/06/14 1213

## 2014-09-02 NOTE — ED Notes (Signed)
Patient c/o right side head and neck pain, sx approx 6 days. He started developing red spots on his face,forehead and hair line within the last 48 hours. He denies any new products but states he just finished some Meloxicam that was previously prescribed. Pain is constant but also depends on his activity and position.

## 2014-09-02 NOTE — Discharge Instructions (Signed)
May take Ibuprofen 200mg , 4 tabs every 8 hours with food as needed for pain.  If rash begins to appear on tip of nose, recommend evaluation by ophthalmologist.    Shingles Shingles (herpes zoster) is an infection that is caused by the same virus that causes chickenpox (varicella). The infection causes a painful skin rash and fluid-filled blisters, which eventually break open, crust over, and heal. It may occur in any area of the body, but it usually affects only one side of the body or face. The pain of shingles usually lasts about 1 month. However, some people with shingles may develop long-term (chronic) pain in the affected area of the body. Shingles often occurs many years after the person had chickenpox. It is more common:  In people older than 50 years.  In people with weakened immune systems, such as those with HIV, AIDS, or cancer.  In people taking medicines that weaken the immune system, such as transplant medicines.  In people under great stress. CAUSES  Shingles is caused by the varicella zoster virus (VZV), which also causes chickenpox. After a person is infected with the virus, it can remain in the person's body for years in an inactive state (dormant). To cause shingles, the virus reactivates and breaks out as an infection in a nerve root. The virus can be spread from person to person (contagious) through contact with open blisters of the shingles rash. It will only spread to people who have not had chickenpox. When these people are exposed to the virus, they may develop chickenpox. They will not develop shingles. Once the blisters scab over, the person is no longer contagious and cannot spread the virus to others. SIGNS AND SYMPTOMS  Shingles shows up in stages. The initial symptoms may be pain, itching, and tingling in an area of the skin. This pain is usually described as burning, stabbing, or throbbing.In a few days or weeks, a painful red rash will appear in the area where the  pain, itching, and tingling were felt. The rash is usually on one side of the body in a band or belt-like pattern. Then, the rash usually turns into fluid-filled blisters. They will scab over and dry up in approximately 2-3 weeks. Flu-like symptoms may also occur with the initial symptoms, the rash, or the blisters. These may include:  Fever.  Chills.  Headache.  Upset stomach. DIAGNOSIS  Your health care provider will perform a skin exam to diagnose shingles. Skin scrapings or fluid samples may also be taken from the blisters. This sample will be examined under a microscope or sent to a lab for further testing. TREATMENT  There is no specific cure for shingles. Your health care provider will likely prescribe medicines to help you manage the pain, recover faster, and avoid long-term problems. This may include antiviral drugs, anti-inflammatory drugs, and pain medicines. HOME CARE INSTRUCTIONS   Take a cool bath or apply cool compresses to the area of the rash or blisters as directed. This may help with the pain and itching.   Take medicines only as directed by your health care provider.   Rest as directed by your health care provider.  Keep your rash and blisters clean with mild soap and cool water or as directed by your health care provider.  Do not pick your blisters or scratch your rash. Apply an anti-itch cream or numbing creams to the affected area as directed by your health care provider.  Keep your shingles rash covered with a loose bandage (  dressing).  Avoid skin contact with:  Babies.   Pregnant women.   Children with eczema.   Elderly people with transplants.   People with chronic illnesses, such as leukemia or AIDS.   Wear loose-fitting clothing to help ease the pain of material rubbing against the rash.  Keep all follow-up visits as directed by your health care provider.If the area involved is on your face, you may receive a referral for a specialist,  such as an eye doctor (ophthalmologist) or an ear, nose, and throat (ENT) doctor. Keeping all follow-up visits will help you avoid eye problems, chronic pain, or disability.  SEEK IMMEDIATE MEDICAL CARE IF:   You have facial pain, pain around the eye area, or loss of feeling on one side of your face.  You have ear pain or ringing in your ear.  You have loss of taste.  Your pain is not relieved with prescribed medicines.   Your redness or swelling spreads.   You have more pain and swelling.  Your condition is worsening or has changed.   You have a fever. MAKE SURE YOU:  Understand these instructions.  Will watch your condition.  Will get help right away if you are not doing well or get worse. Document Released: 06/22/2005 Document Revised: 11/06/2013 Document Reviewed: 02/04/2012 [facility] Patient Information 2015 [place], [place]. This information is not intended to replace advice given to you by your health care provider. Make sure you discuss any questions you have with your health care provider.

## 2014-09-04 DIAGNOSIS — B029 Zoster without complications: Secondary | ICD-10-CM

## 2014-09-04 HISTORY — DX: Zoster without complications: B02.9

## 2014-09-05 ENCOUNTER — Encounter: Payer: Self-pay | Admitting: Primary Care

## 2014-09-05 ENCOUNTER — Ambulatory Visit (INDEPENDENT_AMBULATORY_CARE_PROVIDER_SITE_OTHER): Payer: 59 | Admitting: Primary Care

## 2014-09-05 VITALS — BP 144/87 | HR 78 | Temp 98.2°F | Resp 16 | Ht 73.0 in | Wt 194.4 lb

## 2014-09-05 DIAGNOSIS — B029 Zoster without complications: Secondary | ICD-10-CM | POA: Diagnosis not present

## 2014-09-05 NOTE — Progress Notes (Signed)
Pre visit review using our clinic review tool, if applicable. No additional management support is needed unless otherwise documented below in the visit note. 

## 2014-09-05 NOTE — Assessment & Plan Note (Addendum)
Treated Sunday at urgent care and was started on Valtrex. Stable, healing well, no open lesions. Mild crusting to right scalp. Continue Valtrex, and ibuprofen as needed for pain Continue to follow with Opthalmology. Cleared him for work.   Discussed screening for HIV due to recurrence of shingles despite Zostavax vaccine (shingles in 20's and now age 59). Patient declined at this time as he does not feel he is at a high risk, but will give it some consideration. Encouraged him to call if he changes his mind.

## 2014-09-05 NOTE — Patient Instructions (Addendum)
You may return to work this afternoon as tolerated, you are no longer contagious.  Continue to take your Valtrex as prescribed. Follow up with opthalmology as scheduled. Please let me know if you need anything for nausea. Have a good day!

## 2014-09-05 NOTE — Progress Notes (Signed)
Subjective:    Patient ID: Erik [name], male    DOB: 1956-01-15, 60 y.o.   MRN: [id number]  HPI  Erik [name] is a 59 year old male who presents today for follow up of shingles. He was diagnosed at [facility] in [place] this past Sunday and was started on a course of Valtrex. He presented with a burning pain that began Tuesday with a rash that started Friday/Saturday to the base of the right side of his neck that extended along a nerve root up to the right side of his forehead, over the right eye, and extending towards the tip of the nose.  He followed up with his opthalmologist due to pain involvement in his right eye, had complete eye exam and was provided with eye drops. He is to follow up tomorrow. Denies changes in vision  He continues to take his Valtrex and is also using Advil for pain with has provided moderate relief. Denies history of Herpes Simplex.   Review of Systems  Constitutional: Positive for chills, appetite change and fatigue.  HENT: Negative for ear pain, hearing loss, sinus pressure and sore throat.   Eyes: Positive for pain.       Following closely with opthalmology.  Respiratory: Negative for cough and shortness of breath.   Cardiovascular: Negative for chest pain.  Gastrointestinal: Positive for nausea. Negative for vomiting.  Musculoskeletal: Negative for myalgias and arthralgias.  Skin: Positive for rash.       See HPI  Neurological: Negative for headaches.   Past Medical History  Diagnosis Date  . ALLERGIC RHINITIS 12/19/2009  . HYPOGONADISM, MALE 08/11/2007    f/u by Dr Thomasene Mohair urology    . OSTEOPOROSIS, IDIOPATHIC 08/11/2007    f/u at [facility] , now improved to osteopenia  . Raynaud's disease /phenomenon 08/16/2012  . Back pain chronic     On hydrocodone-Neurontin  . Blepharitis     B, chronic  . Chronic prostatitis      f/u by urology, has a prostate massage prn  . BPH (benign prostatic hyperplasia)   . Varicose veins   . Rosacea      History   Social History  . Marital Status: Married    Spouse Name: N/A  . Number of Children: 3  . Years of Education: N/A   Occupational History  . Media planner for [employer]      Social History Main Topics  . Smoking status: Never Smoker   . Smokeless tobacco: Not on file  . Alcohol Use: No  . Drug Use: No  . Sexual Activity: Not on file   Other Topics Concern  . Not on file   Social History Narrative   Lives with wife.  They have three children.   Daughter is an Ship broker      Past Surgical History  Procedure Laterality Date  . Cervical fusion  1994    C4-C5-C6 fusion  . Transurethral resection of prostate  07-2009  . Blepharoplasty      B blepharoplasty @ [facility] 08-2011    Family History  Problem Relation Age of Onset  . COPD Father     Died, 71  . Diabetes Mother     M, GP  . Hypertension Mother   . CAD Mother     F, M, GP  . Hypertension Sister   . Fibromyalgia Sister   . Hyperlipidemia Sister   . Colon cancer Neg Hx   . Prostate cancer Neg Hx  Allergies  Allergen Reactions  . Sulfa Drugs Cross Reactors Nausea And Vomiting    Current Outpatient Prescriptions on File Prior to Visit  Medication Sig Dispense Refill  . amLODipine (NORVASC) 5 MG tablet Take 5 mg by mouth daily. Prn Reynaud's    . celecoxib (CELEBREX) 200 MG capsule Take 200 mg by mouth daily.    . cyclobenzaprine (FLEXERIL) 10 MG tablet Take 1 tablet (10 mg total) by mouth at bedtime as needed for muscle spasms. 30 tablet 0  . fluticasone (FLONASE) 50 MCG/ACT nasal spray USE TWO SPRAYS INTO EACHNOSTRIL DAILY PRN    . HYDROcodone-acetaminophen (NORCO/VICODIN) 5-325 MG per tablet Take 1 tablet by mouth every 6 (six) hours as needed for moderate pain. 15 tablet 0  . loratadine (CLARITIN) 10 MG tablet Take 10 mg by mouth daily.    . minocycline (MINOCIN,DYNACIN) 50 MG capsule     . omeprazole (PRILOSEC) 20 MG capsule Take 20 mg by mouth daily.    . risedronate  (ACTONEL) 150 MG tablet Take 150 mg by mouth every 30 (thirty) days.     . tadalafil (CIALIS) 5 MG tablet Take 5 mg by mouth daily.     Marland Kitchen testosterone cypionate (DEPOTESTOTERONE CYPIONATE) 100 MG/ML injection Inject into the muscle every 14 (fourteen) days. For IM use only    . valACYclovir (VALTREX) 1000 MG tablet Take 1 tablet (1,000 mg total) by mouth 3 (three) times daily. 21 tablet 0   No current facility-administered medications on file prior to visit.    BP 144/87 mmHg  Pulse 78  Temp(Src) 98.2 F (36.8 C) (Oral)  Resp 16  Ht 6\' 1"  (1.854 m)  Wt 194 lb 6.4 oz (88.179 kg)  BMI 25.65 kg/m2  SpO2 100%       Objective:   Physical Exam  Constitutional: He is oriented to person, place, and time. He appears well-developed.  HENT:  Head: Normocephalic.    Right Ear: External ear normal.  Left Ear: External ear normal.  Mouth/Throat: Oropharynx is clear and moist.  Very faint line of erythema. Small bump noted to right frontal portion of cranium, mildly reddened, appears to be healing. No open lesions. Scalp slightly tender, small scabbing noted to right side of scalp. Again, no open lesions.  Eyes: Conjunctivae are normal. Pupils are equal, round, and reactive to light. Right eye exhibits no discharge. Left eye exhibits no discharge.  Neck: Neck supple.  Cardiovascular: Normal rate and regular rhythm.   Pulmonary/Chest: Effort normal and breath sounds normal.  Musculoskeletal: Normal range of motion.  Lymphadenopathy:    He has no cervical adenopathy.  Neurological: He is alert and oriented to person, place, and time.  Skin: Skin is warm and dry. Rash noted.  Psychiatric: He has a normal mood and affect.          Assessment & Plan:

## 2014-10-01 DIAGNOSIS — H532 Diplopia: Secondary | ICD-10-CM | POA: Insufficient documentation

## 2014-10-01 DIAGNOSIS — H40023 Open angle with borderline findings, high risk, bilateral: Secondary | ICD-10-CM | POA: Insufficient documentation

## 2014-10-31 ENCOUNTER — Other Ambulatory Visit: Payer: Self-pay

## 2014-10-31 ENCOUNTER — Encounter: Payer: Self-pay | Admitting: Internal Medicine

## 2014-10-31 ENCOUNTER — Telehealth: Payer: Self-pay

## 2014-10-31 DIAGNOSIS — Z1211 Encounter for screening for malignant neoplasm of colon: Secondary | ICD-10-CM

## 2014-10-31 NOTE — Telephone Encounter (Signed)
As Doc of Day, can you have Dr. Ardis Hughs review this patients previous colon reports from 2009 and 2011 and advise on scheduling a repeat colonoscopy?  This information is scanned into patients chart under procedures and media. Patient has referral in EPIC to schedule. Thank you.

## 2014-11-01 ENCOUNTER — Encounter: Payer: Self-pay | Admitting: Gastroenterology

## 2014-11-01 NOTE — Telephone Encounter (Signed)
Spent 15 minutes reviewing 5 previous procedures and associated pathology  04/2003 colonoscopy Dr. Valli Glance in [facility] for hematochezia; 3-4cm fleshy rectal polyp that was SSA, removed 09/2003 flex sig, Dr. Valli Glance; for polyp follow up; normal 06/2004 Colonoscopy Dr. Valli Glance for polyp follow up; normal 12/2007 1cm polyp in sigmoid, + hemorrhoids; polyp was TA on pathology 07/2009 colonoscopy for RLQ pain; normal except anal fissure   Yes he needs another colonoscopy around now for personal history of precancerous colon polyps.  Thanks

## 2014-11-01 NOTE — Telephone Encounter (Signed)
Dr Ardis Hughs responded, he can be scheduled now thanks

## 2014-11-01 NOTE — Telephone Encounter (Signed)
Pt has been scheduled for previsit and colon by Cammie Mcgee [facility]

## 2014-11-08 ENCOUNTER — Ambulatory Visit (INDEPENDENT_AMBULATORY_CARE_PROVIDER_SITE_OTHER): Payer: 59 | Admitting: Medical

## 2014-11-08 ENCOUNTER — Other Ambulatory Visit: Payer: Self-pay | Admitting: Medical

## 2014-11-08 ENCOUNTER — Encounter: Payer: Self-pay | Admitting: Medical

## 2014-11-08 ENCOUNTER — Ambulatory Visit (HOSPITAL_BASED_OUTPATIENT_CLINIC_OR_DEPARTMENT_OTHER)
Admission: RE | Admit: 2014-11-08 | Discharge: 2014-11-08 | Disposition: A | Discharge status: Home / Self Care | Payer: 59 | Source: Ambulatory Visit | Attending: Medical | Admitting: Medical

## 2014-11-08 VITALS — BP 126/87 | HR 79 | Temp 98.4°F | Ht 73.0 in | Wt 193.0 lb

## 2014-11-08 DIAGNOSIS — R0781 Pleurodynia: Secondary | ICD-10-CM

## 2014-11-08 MED ORDER — HYDROCODONE-ACETAMINOPHEN 5-325 MG PO TABS: 1.0000 | ORAL_TABLET | Freq: Four times a day (QID) | ORAL | Status: DC | PRN

## 2014-11-08 NOTE — Progress Notes (Signed)
Pre visit review using our clinic review tool, if applicable. No additional management support is needed unless otherwise documented below in the visit note. 

## 2014-11-08 NOTE — Patient Instructions (Signed)
Rib pain on right side Xray of chest and rt rib. Use alleve otc during the day. Rx hydrocdone at night or during day if needed severe pain.   Will follow xray results and notify you of results. Follow up 2-3 wks pcp or as needed.  Not to use residual tramadol tabs with hydrocdone.

## 2014-11-08 NOTE — Progress Notes (Signed)
Subjective:    Patient ID: Erik [name], male    DOB: 1955-11-06, 59 y.o.   MRN: [id number]  HPI Pt states fell on his rt side in his yard. Playing with some kids from neighborhood. Sharp pain when hit ground. Pain has not subsided. Pt has been using some left over tramadol from prior rib injury as well. States remodeling injury in past. He states pain worse at night. Tramadol not adequate at night. Pt has not been evaluted. He is running out of tramadol  Pt states injury about 3 wks ago.   Review of Systems  Constitutional: Negative for fever, chills, diaphoresis, activity change and fatigue.  Respiratory: Negative for cough, chest tightness and shortness of breath.   Cardiovascular: Negative for chest pain, palpitations and leg swelling.  Gastrointestinal: Negative for nausea, vomiting and abdominal pain.  Musculoskeletal: Negative for neck pain and neck stiffness.       Rt rib pain on movement and deep breathing.  Neurological: Negative for dizziness, tremors, seizures, syncope, facial asymmetry, speech difficulty, weakness, light-headedness, numbness and headaches.  Psychiatric/Behavioral: Negative for behavioral problems, confusion and agitation. The patient is not nervous/anxious.     Past Medical History  Diagnosis Date  . ALLERGIC RHINITIS 12/19/2009  . HYPOGONADISM, MALE 08/11/2007    f/u by Dr Thomasene Mohair urology    . OSTEOPOROSIS, IDIOPATHIC 08/11/2007    f/u at [facility] , now improved to osteopenia  . Raynaud's disease /phenomenon 08/16/2012  . Back pain chronic     On hydrocodone-Neurontin  . Blepharitis     B, chronic  . Chronic prostatitis      f/u by urology, has a prostate massage prn  . BPH (benign prostatic hyperplasia)   . Varicose veins   . Rosacea     History   Social History  . Marital Status: Married    Spouse Name: N/A  . Number of Children: 3  . Years of Education: N/A   Occupational History  . Media planner for [employer]      Social  History Main Topics  . Smoking status: Never Smoker   . Smokeless tobacco: Not on file  . Alcohol Use: No  . Drug Use: No  . Sexual Activity: Not on file   Other Topics Concern  . Not on file   Social History Narrative   Lives with wife.  They have three children.   Daughter is an Ship broker      Past Surgical History  Procedure Laterality Date  . Cervical fusion  1994    C4-C5-C6 fusion  . Transurethral resection of prostate  07-2009  . Blepharoplasty      B blepharoplasty @ [facility] 08-2011    Family History  Problem Relation Age of Onset  . COPD Father     Died, 71  . Diabetes Mother     M, GP  . Hypertension Mother   . CAD Mother     F, M, GP  . Hypertension Sister   . Fibromyalgia Sister   . Hyperlipidemia Sister   . Colon cancer Neg Hx   . Prostate cancer Neg Hx     Allergies  Allergen Reactions  . Sulfa Drugs Cross Reactors Nausea And Vomiting    Current Outpatient Prescriptions on File Prior to Visit  Medication Sig Dispense Refill  . amLODipine (NORVASC) 5 MG tablet Take 5 mg by mouth daily. Prn Reynaud's    . cyclobenzaprine (FLEXERIL) 10 MG tablet Take 1 tablet (10 mg total) by  mouth at bedtime as needed for muscle spasms. 30 tablet 0  . loratadine (CLARITIN) 10 MG tablet Take 10 mg by mouth daily.    . minocycline (MINOCIN,DYNACIN) 50 MG capsule     . risedronate (ACTONEL) 150 MG tablet Take 150 mg by mouth every 30 (thirty) days.     . tadalafil (CIALIS) 5 MG tablet Take 5 mg by mouth daily.     Marland Kitchen testosterone cypionate (DEPOTESTOTERONE CYPIONATE) 100 MG/ML injection Inject into the muscle every 14 (fourteen) days. For IM use only     No current facility-administered medications on file prior to visit.    BP 126/87 mmHg  Pulse 79  Temp(Src) 98.4 F (36.9 C) (Oral)  Ht 6\' 1"  (1.854 m)  Wt 193 lb (87.544 kg)  BMI 25.47 kg/m2  SpO2 99%       Objective:   Physical Exam   General- No acute distress. Pleasant patient. Neck- Full range of  motion, no jvd Lungs- Clear, even and unlabored. Heart- regular rate and rhythm. Neurologic- CNII- XII grossly intact. Rt side rib area- midaxillary region and below pectoralis region pain on breathing and palpation.      Assessment & Plan:

## 2014-11-08 NOTE — Assessment & Plan Note (Addendum)
Xray of chest and rt rib. Use alleve otc during the day. Rx hydrocdone at night or during day if needed severe pain.   Will follow xray results and notify you of results. Follow up 2-3 wks pcp or as needed.  Not to use residual tramadol tabs with hydrocdone.

## 2014-11-15 ENCOUNTER — Encounter: Payer: Self-pay | Admitting: Internal Medicine

## 2014-11-19 ENCOUNTER — Ambulatory Visit: Payer: 59 | Admitting: Internal Medicine

## 2014-11-29 ENCOUNTER — Ambulatory Visit (INDEPENDENT_AMBULATORY_CARE_PROVIDER_SITE_OTHER): Payer: 59 | Admitting: Internal Medicine

## 2014-11-29 ENCOUNTER — Encounter: Payer: Self-pay | Admitting: Internal Medicine

## 2014-11-29 VITALS — BP 128/84 | HR 81 | Temp 97.6°F | Ht 73.0 in | Wt 195.5 lb

## 2014-11-29 DIAGNOSIS — I7300 Raynaud's syndrome without gangrene: Secondary | ICD-10-CM

## 2014-11-29 DIAGNOSIS — M542 Cervicalgia: Secondary | ICD-10-CM

## 2014-11-29 MED ORDER — HYDROCODONE-ACETAMINOPHEN 5-325 MG PO TABS: 1.0000 | ORAL_TABLET | Freq: Four times a day (QID) | ORAL | Status: DC | PRN

## 2014-11-29 NOTE — Progress Notes (Signed)
Pre visit review using our clinic review tool, if applicable. No additional management support is needed unless otherwise documented below in the visit note. 

## 2014-11-29 NOTE — Progress Notes (Signed)
Subjective:    Patient ID: Erik [name], male    DOB: 31-May-1956, 59 y.o.   MRN: [id number]  DOS:  11/29/2014 Type of visit - description : rov Interval history: Chronic neck and back pain, on pain medication, symptoms well-controlled Since the last visit he has developed foot pain, sees podiatry. On mobic He also had shingles, symptoms resolved. Has seen urology for HRT, last PSA satisfactory per patient.    Review of Systems  Denies abdominal pain, nausea, vomiting, diarrhea or blood in the stools.   Past Medical History  Diagnosis Date  . ALLERGIC RHINITIS 12/19/2009  . HYPOGONADISM, MALE 08/11/2007    f/u by Dr Thomasene Mohair urology    . OSTEOPOROSIS, IDIOPATHIC 08/11/2007    f/u at [facility] , now improved to osteopenia  . Raynaud's disease /phenomenon 08/16/2012  . Back pain chronic     On hydrocodone-Neurontin  . Blepharitis     B, chronic  . Chronic prostatitis      f/u by urology, has a prostate massage prn  . BPH (benign prostatic hyperplasia)   . Varicose veins   . Rosacea   . Shingles 09-2014    Past Surgical History  Procedure Laterality Date  . Cervical fusion  1994    C4-C5-C6 fusion  . Transurethral resection of prostate  07-2009  . Blepharoplasty      B blepharoplasty @ [facility] 08-2011    History   Social History  . Marital Status: Married    Spouse Name: N/A  . Number of Children: 3  . Years of Education: N/A   Occupational History  . Media planner for [employer]      Social History Main Topics  . Smoking status: Never Smoker   . Smokeless tobacco: Not on file  . Alcohol Use: No  . Drug Use: No  . Sexual Activity: Not on file   Other Topics Concern  . Not on file   Social History Narrative   Lives with wife.  They have three children.   Daughter is an Ship broker          Medication List       This list is accurate as of: 11/29/14  2:10 PM.  Always use your most recent med list.               amLODipine 5 MG tablet    Commonly known as:  NORVASC  Take 5 mg by mouth daily. Prn Reynaud's     cyclobenzaprine 10 MG tablet  Commonly known as:  FLEXERIL  Take 1 tablet (10 mg total) by mouth at bedtime as needed for muscle spasms.     HYDROcodone-acetaminophen 5-325 MG per tablet  Commonly known as:  NORCO  Take 1 tablet by mouth every 6 (six) hours as needed for moderate pain.     loratadine 10 MG tablet  Commonly known as:  CLARITIN  Take 10 mg by mouth daily.     meloxicam 15 MG tablet  Commonly known as:  MOBIC  Take 15 mg by mouth daily.     minocycline 50 MG capsule  Commonly known as:  MINOCIN,DYNACIN     risedronate 150 MG tablet  Commonly known as:  ACTONEL  Take 150 mg by mouth every 30 (thirty) days.     tadalafil 5 MG tablet  Commonly known as:  CIALIS  Take 5 mg by mouth daily.     testosterone cypionate 100 MG/ML injection  Commonly known as:  DEPOTESTOTERONE CYPIONATE  Inject into the muscle every 14 (fourteen) days. For IM use only           Objective:   Physical Exam BP 128/84 mmHg  Pulse 81  Temp(Src) 97.6 F (36.4 C) (Oral)  Ht 6\' 1"  (1.854 m)  Wt 195 lb 8 oz (88.678 kg)  BMI 25.80 kg/m2  SpO2 99% General:   Well developed, well nourished . NAD.  HEENT:  Normocephalic . Face symmetric, atraumatic Lungs:  CTA B Normal respiratory effort, no intercostal retractions, no accessory muscle use. Heart: RRR,  no murmur.  No pretibial edema bilaterally  Skin: Not pale. Not jaundice Neurologic:  alert & oriented X3.  Speech normal, gait appropriate for age and unassisted Psych--  Cognition and judgment appear intact.  Cooperative with normal attention span and concentration.  Behavior appropriate. No anxious or depressed appearing.      Assessment & Plan:

## 2014-11-29 NOTE — Assessment & Plan Note (Signed)
Symptoms well-controlled with hydrocodone 1 tablet daily, last UDS 05-2014 risk. 2 prescription printed today Call for more refills as needed

## 2014-11-29 NOTE — Assessment & Plan Note (Signed)
No issues recently due to good weather, on amlodipine as needed

## 2014-12-28 ENCOUNTER — Ambulatory Visit (AMBULATORY_SURGERY_CENTER): Payer: Self-pay

## 2014-12-28 VITALS — Ht 72.0 in | Wt 192.0 lb

## 2014-12-28 DIAGNOSIS — Z860100 Personal history of colon polyps, unspecified: Secondary | ICD-10-CM

## 2014-12-28 NOTE — Progress Notes (Signed)
No allergies to eggs or soy No diet/weight loss meds No home oxygen No past problems with anesthesia EXCEPT PONV with general anesthesia  Has email  Emmi instructions given for colonoscopy

## 2015-01-11 ENCOUNTER — Encounter: Payer: 59 | Admitting: Gastroenterology

## 2015-04-12 ENCOUNTER — Ambulatory Visit (INDEPENDENT_AMBULATORY_CARE_PROVIDER_SITE_OTHER): Payer: 59

## 2015-04-12 DIAGNOSIS — Z23 Encounter for immunization: Secondary | ICD-10-CM | POA: Diagnosis not present

## 2015-04-29 ENCOUNTER — Encounter: Payer: Self-pay | Admitting: Internal Medicine

## 2015-04-29 ENCOUNTER — Ambulatory Visit (INDEPENDENT_AMBULATORY_CARE_PROVIDER_SITE_OTHER): Payer: 59 | Admitting: Internal Medicine

## 2015-04-29 ENCOUNTER — Ambulatory Visit (HOSPITAL_BASED_OUTPATIENT_CLINIC_OR_DEPARTMENT_OTHER)
Admission: RE | Admit: 2015-04-29 | Discharge: 2015-04-29 | Disposition: A | Discharge status: Home / Self Care | Payer: 59 | Source: Ambulatory Visit | Attending: Internal Medicine | Admitting: Internal Medicine

## 2015-04-29 VITALS — BP 104/66 | HR 84 | Temp 97.7°F | Ht 73.0 in | Wt 194.2 lb

## 2015-04-29 DIAGNOSIS — S4992XA Unspecified injury of left shoulder and upper arm, initial encounter: Secondary | ICD-10-CM

## 2015-04-29 DIAGNOSIS — M25512 Pain in left shoulder: Secondary | ICD-10-CM | POA: Diagnosis present

## 2015-04-29 DIAGNOSIS — K219 Gastro-esophageal reflux disease without esophagitis: Secondary | ICD-10-CM

## 2015-04-29 DIAGNOSIS — Z09 Encounter for follow-up examination after completed treatment for conditions other than malignant neoplasm: Secondary | ICD-10-CM | POA: Insufficient documentation

## 2015-04-29 MED ORDER — OMEPRAZOLE 40 MG PO CPDR: 40.0000 mg | DELAYED_RELEASE_CAPSULE | Freq: Every day | ORAL | Status: DC

## 2015-04-29 NOTE — Progress Notes (Signed)
Subjective:    Patient ID: Erik [name], male    DOB: Dec 01, 1955, 59 y.o.   MRN: [id number]  DOS:  04/29/2015 Type of visit - description : Acute visit Interval history: Had a couple of falls in the last 6 weeks, one was 03-11-15, fell from the rump a truck, landed on the left side. After the falls he started experiencing left shoulder pain with occasional radiation to the hand. He has chronic neck and back pain >>> at baseline. He takes hydrocodone for back pain but that is not helping much with shoulder pain. Also, started Actonel approximately 14 months ago, since then GERD symptoms have come back to some extent, mostly heartburn at night, like a burning at this throat. He is taking OTC Prilosec with mild help.  Review of Systems  Denies nausea, vomiting, diarrhea or blood in the stools. No actual dysphagia or odynophagia. No weight loss.  Past Medical History  Diagnosis Date  . ALLERGIC RHINITIS 12/19/2009  . HYPOGONADISM, MALE 08/11/2007    f/u by Dr Thomasene Mohair urology    . OSTEOPOROSIS, IDIOPATHIC 08/11/2007    f/u at [facility] , now improved to osteopenia  . Raynaud's disease /phenomenon 08/16/2012  . Back pain chronic     On hydrocodone-Neurontin  . Blepharitis     B, chronic  . Chronic prostatitis      f/u by urology, has a prostate massage prn  . BPH (benign prostatic hyperplasia)   . Varicose veins   . Rosacea   . Shingles 09-2014    Past Surgical History  Procedure Laterality Date  . Cervical fusion  1994    C4-C5-C6 fusion  . Transurethral resection of prostate  07-2009  . Blepharoplasty      B blepharoplasty @ [facility] 08-2011    Social History   Social History  . Marital Status: Married    Spouse Name: N/A  . Number of Children: 3  . Years of Education: N/A   Occupational History  . Media planner for [employer]      Social History Main Topics  . Smoking status: Never Smoker   . Smokeless tobacco: Never Used  . Alcohol Use: No  . Drug Use: No    . Sexual Activity: Not on file   Other Topics Concern  . Not on file   Social History Narrative   Lives with wife.  They have three children.   Daughter is an Ship broker          Medication List       This list is accurate as of: 04/29/15  6:16 PM.  Always use your most recent med list.               amLODipine 5 MG tablet  Commonly known as:  NORVASC  Take 5 mg by mouth daily. Prn Reynaud's     cyclobenzaprine 10 MG tablet  Commonly known as:  FLEXERIL  Take 1 tablet (10 mg total) by mouth at bedtime as needed for muscle spasms.     HYDROcodone-acetaminophen 5-325 MG tablet  Commonly known as:  NORCO  Take 1 tablet by mouth every 6 (six) hours as needed for moderate pain.     loratadine 10 MG tablet  Commonly known as:  CLARITIN  Take 10 mg by mouth daily.     omeprazole 40 MG capsule  Commonly known as:  PRILOSEC  Take 1 capsule (40 mg total) by mouth daily.     risedronate 150 MG tablet  Commonly known as:  ACTONEL  Take 150 mg by mouth every 30 (thirty) days.     tadalafil 5 MG tablet  Commonly known as:  CIALIS  Take 5 mg by mouth daily.     testosterone cypionate 100 MG/ML injection  Commonly known as:  DEPOTESTOTERONE CYPIONATE  Inject into the muscle every 14 (fourteen) days. For IM use only           Objective:   Physical Exam BP 104/66 mmHg  Pulse 84  Temp(Src) 97.7 F (36.5 C) (Oral)  Ht 6\' 1"  (1.854 m)  Wt 194 lb 4 oz (88.111 kg)  BMI 25.63 kg/m2  SpO2 98% General:   Well developed, well nourished . NAD.  HEENT:  Normocephalic . Face symmetric, atraumatic Lungs:  CTA B Normal respiratory effort, no intercostal retractions, no accessory muscle use. Heart: RRR,  no murmur.  no pretibial edema bilaterally  MSK: Shoulders symmetric, no TTP. Right shoulder range of motion normal Left shoulder range of motion limited by pain with arm elevation. Abdomen:  Not distended, soft, non-tender. No rebound or rigidity. No  mass,organomegaly Skin: Not pale. Not jaundice Neurologic:  alert & oriented X3.  Speech normal, gait appropriate for age and unassisted Psych--  Cognition and judgment appear intact.  Cooperative with normal attention span and concentration.  Behavior appropriate. No anxious or depressed appearing.    Assessment & Plan:   Assessment > Osteoporosis, idiopathic, follow-up at [facility] -- s/p Cervical fusion --Chronic back pain: Hydrocodone, gabapentin  GU: Dr. Thomasene Mohair, urology --BPH, TURP 2011 --Chronic prostatitis --Hypogonadism, per urology Rosacea Chronic blepharitis Shingles 09-2014 H/o Raynaud phenomena 2013   Plan: Left shoulder pain after to accidental falls, will get x-ray and refer to [place]. GERD: Symptoms started about a year ago when he started Actonel, no esophagitis type symptoms (dysphagia, odynophagia). Will rx omeprazole daily and ranitidine at bedtime. Precautions discussed. RTC: In few weeks for a Physical

## 2015-04-29 NOTE — Patient Instructions (Signed)
  Stop by the first floor and get the XR    For acid reflux: Omeprazole 1 tablet before breakfast every day OTC Zantac 75 milligrams one or 2 tablets at bedtime   Next visit  for a  physical exam by November or December 2016, fasting  Please schedule an appointment at the front desk     Food Choices for Gastroesophageal Reflux Disease, Adult When you have gastroesophageal reflux disease (GERD), the foods you eat and your eating habits are very important. Choosing the right foods can help ease the discomfort of GERD. WHAT GENERAL GUIDELINES DO I NEED TO FOLLOW?  Choose fruits, vegetables, whole grains, low-fat dairy products, and low-fat meat, fish, and poultry.  Limit fats such as oils, salad dressings, butter, nuts, and avocado.  Keep a food diary to identify foods that cause symptoms.  Avoid foods that cause reflux. These may be different for different people.  Eat frequent small meals instead of three large meals each day.  Eat your meals slowly, in a relaxed setting.  Limit fried foods.  Cook foods using methods other than frying.  Avoid drinking alcohol.  Avoid drinking large amounts of liquids with your meals.  Avoid bending over or lying down until 2-3 hours after eating. WHAT FOODS ARE NOT RECOMMENDED? The following are some foods and drinks that may worsen your symptoms: Vegetables Tomatoes. Tomato juice. Tomato and spaghetti sauce. Chili peppers. Onion and garlic. Horseradish. Fruits Oranges, grapefruit, and lemon (fruit and juice). Meats High-fat meats, fish, and poultry. This includes hot dogs, ribs, ham, sausage, salami, and bacon. Dairy Whole milk and chocolate milk. Sour cream. Cream. Butter. Ice cream. Cream cheese.  Beverages Coffee and tea, with or without caffeine. Carbonated beverages or energy drinks. Condiments Hot sauce. Barbecue sauce.  Sweets/Desserts Chocolate and cocoa. Donuts. Peppermint and spearmint. Fats and Oils High-fat foods,  including [place] fries and potato chips. Other Vinegar. Strong spices, such as black pepper, white pepper, red pepper, cayenne, curry powder, cloves, ginger, and chili powder. The items listed above may not be a complete list of foods and beverages to avoid. Contact your dietitian for more information.   This information is not intended to replace advice given to you by your health care provider. Make sure you discuss any questions you have with your health care provider.   Document Released: 06/22/2005 Document Revised: 07/13/2014 Document Reviewed: 04/26/2013 Elsevier Interactive Patient Education [employer].

## 2015-04-29 NOTE — Progress Notes (Signed)
Pre visit review using our clinic review tool, if applicable. No additional management support is needed unless otherwise documented below in the visit note. 

## 2015-04-29 NOTE — Assessment & Plan Note (Signed)
Left shoulder pain after to accidental falls, will get x-ray and refer to [place]. GERD: Symptoms started about a year ago when he started Actonel, no esophagitis type symptoms (dysphagia, odynophagia). Will rx omeprazole daily and ranitidine at bedtime. Precautions discussed. RTC: In few weeks for a Physical

## 2015-05-14 ENCOUNTER — Encounter: Payer: Self-pay | Admitting: Physical Therapy

## 2015-05-14 ENCOUNTER — Ambulatory Visit: Payer: 59 | Attending: Sports Medicine | Admitting: Physical Therapy

## 2015-05-14 DIAGNOSIS — R29898 Other symptoms and signs involving the musculoskeletal system: Secondary | ICD-10-CM | POA: Insufficient documentation

## 2015-05-14 DIAGNOSIS — M25512 Pain in left shoulder: Secondary | ICD-10-CM | POA: Insufficient documentation

## 2015-05-14 DIAGNOSIS — M25612 Stiffness of left shoulder, not elsewhere classified: Secondary | ICD-10-CM | POA: Insufficient documentation

## 2015-05-15 NOTE — Therapy (Addendum)
[facility] [address]  [place], [place], [address] Phone: [phone]   Fax:  [phone]  Physical Therapy Evaluation  Patient Details  Name: Erik [name] MRN: [id number] Date of Birth: [birth year] Referring Provider: Vickki Hearing  Encounter Date: 05/14/2015      PT End of Session - 05/14/15 1715    Visit Number 1   Number of Visits 12   Date for PT Re-Evaluation 06/25/15   PT Start Time 1706   PT Stop Time 1758   PT Time Calculation (min) 52 min      Past Medical History  Diagnosis Date  . ALLERGIC RHINITIS 12/19/2009  . HYPOGONADISM, MALE 08/11/2007    f/u by Dr Thomasene Mohair urology    . OSTEOPOROSIS, IDIOPATHIC 08/11/2007    f/u at [facility] , now improved to osteopenia  . Raynaud's disease /phenomenon 08/16/2012  . Back pain chronic     On hydrocodone-Neurontin  . Blepharitis     B, chronic  . Chronic prostatitis      f/u by urology, has a prostate massage prn  . BPH (benign prostatic hyperplasia)   . Varicose veins   . Rosacea   . Shingles 09-2014    Past Surgical History  Procedure Laterality Date  . Cervical fusion  1994    C4-C5-C6 fusion  . Transurethral resection of prostate  07-2009  . Blepharoplasty      B blepharoplasty @ [facility] 08-2011    There were no vitals filed for this visit.  Visit Diagnosis:  Left shoulder pain  Shoulder weakness  Shoulder stiffness, left      Subjective Assessment - 05/14/15 1716    Subjective Pt c/o onset of L shoulder pain in Sept 2016.  Fell in Sept and landed on outstretched L UE.  Noted onset of pain that evening.   Diagnostic tests x-rays normal   Currently in Pain? Yes   Pain Score --  rates AVG pain 5/10 with activity and worst pain up to 7/10   Pain Location Shoulder   Pain Orientation Left   Pain Descriptors / Indicators Sharp   Pain Radiating Towards lateral upper arm   Pain Onset More than a month ago   Pain Frequency Intermittent   Aggravating Factors  overhead reaching   Pain Relieving Factors medication           TODAY'S TREATMENT TherEx - HEP instruct and perform: Standing Shoulder ER and IR Yellow TB 10x each Seated Low Row Black TB 10x Standing L Shoulder Extension Yellow TB 10x  Kinesiology taping to L shoulder: 4 strips- 75% subacromial space, 50% anterior and posterior delt, 50% lateral delt into supraspinatus                    PT Education - 05/14/15 1700    Education provided Yes   Education Details initial HEP   Person(s) Educated Patient   Methods Explanation;Demonstration;Handout   Comprehension Verbalized understanding;Returned demonstration          PT Short Term Goals - 05/15/15 1308    PT SHORT TERM GOAL #1   Title pt independent with initial HEP by 05/17/15   Status New           PT Long Term Goals - 05/14/15 1309    PT LONG TERM GOAL #1   Title pt independent with advanced HEP as necessary by 06/25/15   Status New   PT LONG TERM GOAL #2  Title R Shoulder AROM WFL all planes without c/o pain by 06/25/15   Status New   PT LONG TERM GOAL #3   Title R Shoulder MMT 4+/5 all planes without c/o pain to allow performance of all chores and recreation activities by 06/25/15   Status New   PT LONG TERM GOAL #4   Title pt able to sleep without limitation by shoulder pain by 06/25/15   Status New   PT LONG TERM GOAL #5   Title pt reports infrequent shoulder pain rating no greater than 3/10 by 06/25/15   Status New               Plan - 05/15/15 1314    Clinical Impression Statement pt with c/o L shoudler pain since falling in Sept 2016.  X-rays normal.  AROM and MMT limited by pain but PROM is near normal although still limited by pain.  Some clicking and catch sensation noted during PROM into ER and ABD.  TTP at supraspin insertion area and along infraspinatus muscle belly.  Special testing indicates possible RC injury/strain and questionable labral  injury.  Pt notes pain in L shoulder 5/10 on AVG and up to 7/10 lately.  He states his level of function is very restricted with L UE and has to use several pillows to support L UE to sleep at night.  He denies n/t.   Pt will benefit from skilled therapeutic intervention in order to improve on the following deficits Pain;Decreased strength;Decreased mobility;Decreased range of motion   Rehab Potential Good   PT Frequency 2x / week   PT Duration 6 weeks   PT Treatment/Interventions Manual techniques;Therapeutic exercise;Therapeutic activities;Vasopneumatic Device;Taping;Dry needling;Patient/family education;Neuromuscular re-education;Ultrasound;Iontophoresis 4mg /ml Dexamethasone;Moist Heat;Electrical Stimulation;Cryotherapy;Functional mobility training   PT Next Visit Plan shoulder stability and RC strengthening as able; AROM as tolerated; manual and modalities PRN; ionto with dex if MD and pt agree   Consulted and Agree with Plan of Care Patient         Problem List Patient Active Problem List   Diagnosis Date Noted  . PCP NOTES >>> 04/29/2015  . Rib pain on right side 11/08/2014  . Binocular vision disorder with diplopia 10/01/2014  . Open angle with borderline findings, high risk 10/01/2014  . 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 Herpes zoster-- 09-2014 09/05/2014  . Anal fissure 02/28/2014  . Chronic prostatitis 02/28/2014  . Abnormal prostate specific antigen 02/28/2014  . Acid reflux 02/28/2014  . Ganglion of tendon 02/28/2014  . Incomplete bladder emptying 02/28/2014  . FOM (frequency of micturition) 02/28/2014  . Osteopenia 02/28/2014  . Sesamoiditis 02/28/2014  . Urgency of micturation 02/28/2014  . Baker's cyst of knee 04/26/2013  . 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 Speech abnormality 02/13/2013  . Raynaud phenomenon 08/16/2012  . Glaucoma suspect 04/19/2012  . Blepharitis 04/19/2012  . Annual physical exam  10/21/2011  . Neck -- back pain- UDS 09/18/2011  . ALLERGIC RHINITIS 12/19/2009  . PELVIC PAIN, CHRONIC 04/03/2009  . Hypogonadism -- per urology 08/11/2007  . OSTEOPOROSIS, IDIOPATHIC 08/11/2007  . HEADACHE 08/11/2007    Lonny Eisen PT, OCS 05/15/2015, 1:19 PM  [facility] [address]  [place], [place], [address] Phone: [phone]   Fax:  [phone]  Name: Erik [name] MRN: [id number] Date of Birth: [birth year]

## 2015-05-17 ENCOUNTER — Ambulatory Visit: Payer: 59 | Admitting: Physical Therapy

## 2015-05-17 DIAGNOSIS — M25612 Stiffness of left shoulder, not elsewhere classified: Secondary | ICD-10-CM

## 2015-05-17 DIAGNOSIS — R29898 Other symptoms and signs involving the musculoskeletal system: Secondary | ICD-10-CM

## 2015-05-17 DIAGNOSIS — M25512 Pain in left shoulder: Secondary | ICD-10-CM | POA: Diagnosis not present

## 2015-05-17 NOTE — Therapy (Signed)
[facility] [address]  [place], [place], [address] Phone: [phone]   Fax:  [phone]  Physical Therapy Treatment  Patient Details  Name: Erik [name] MRN: [id number] Date of Birth: 1956-01-24 Referring Provider: Vickki Hearing  Encounter Date: 05/17/2015      PT End of Session - 05/17/15 0810    Visit Number 2   Number of Visits 12   Date for PT Re-Evaluation 06/25/15   PT Start Time 0807   PT Stop Time 0845   PT Time Calculation (min) 38 min      Past Medical History  Diagnosis Date  . ALLERGIC RHINITIS 12/19/2009  . HYPOGONADISM, MALE 08/11/2007    f/u by Dr Thomasene Mohair urology    . OSTEOPOROSIS, IDIOPATHIC 08/11/2007    f/u at [facility] , now improved to osteopenia  . Raynaud's disease /phenomenon 08/16/2012  . Back pain chronic     On hydrocodone-Neurontin  . Blepharitis     B, chronic  . Chronic prostatitis      f/u by urology, has a prostate massage prn  . BPH (benign prostatic hyperplasia)   . Varicose veins   . Rosacea   . Shingles 09-2014  . 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 Cervical fusion  1994    C4-C5-C6 fusion  . Transurethral resection of prostate  07-2009  . Blepharoplasty      B blepharoplasty @ [facility] 08-2011    There were no vitals filed for this visit. Visit Diagnosis:  Left shoulder pain  Shoulder weakness  Shoulder stiffness, left      Subjective Assessment - 05/17/15 0809    Subjective States has noted some difficulty with L UE grip lately.   Currently in Pain? Yes   Pain Score 3    Pain Location Shoulder   Pain Orientation Left           TODAY'S TREATMENT TherEx - UBE lvl 1.0 1'/1'  Manual - Supine L GH Grade 2 AP and Caudal glides with gentle PROM; STM to L posterior scapular mms.  High tone and tenderness noted in this area.  Discussed Dry Needling to this area and pt provided informed  consent to perform Dry Needling to L Shoulder today.  Dry Needling performed with Pt prone and 3 needles used to L infraspinatus and 1 to central aspect of lateral deltoid.  Premod e-stim (10-20Hz ) ran in 20" bouts between each of the 4 needles.  Following completion of Dry Needling re-taped L shoulder same as last treatment: 4 strips- 75% subacromial space, 50% anterior and posterior delt, 50% lateral delt into supraspinatus           PT Short Term Goals - 05/17/15 1051    PT SHORT TERM GOAL #1   Title pt independent with initial HEP by 05/17/15   Status On-going           PT Long Term Goals - 05/17/15 1051    PT LONG TERM GOAL #1   Title pt independent with advanced HEP as necessary by 06/25/15   Status On-going   PT LONG TERM GOAL #2   Title R Shoulder AROM WFL all planes without c/o pain by 06/25/15   Status On-going   PT LONG TERM GOAL #3   Title R Shoulder MMT 4+/5 all planes without c/o pain to allow performance of all chores and recreation activities by 06/25/15  Status On-going   PT LONG TERM GOAL #4   Title pt able to sleep without limitation by shoulder pain by 06/25/15   Status On-going   PT LONG TERM GOAL #5   Title pt reports infrequent shoulder pain rating no greater than 3/10 by 06/25/15   Status On-going               Plan - 05/17/15 1058    Clinical Impression Statement pt states tape to should seemed to help decrease pain with shoulder ROM.  Treatment today focused on pain reduction through joint mobes, dry needling, e-sim, and continued kinesiology taping to L shoulder.  Will continue to assess shoulder and modify POC as necessary.   PT Next Visit Plan shoulder stability and RC strengthening as able; AROM as tolerated; manual and modalities PRN; ionto with dex if MD and pt agree   Consulted and Agree with Plan of Care Patient        Problem List Patient Active Problem List   Diagnosis Date Noted  . PCP NOTES >>> 04/29/2015  . Rib pain  on right side 11/08/2014  . Binocular vision disorder with diplopia 10/01/2014  . Open angle with borderline findings, high risk 10/01/2014  . 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 Herpes zoster-- 09-2014 09/05/2014  . Anal fissure 02/28/2014  . Chronic prostatitis 02/28/2014  . Abnormal prostate specific antigen 02/28/2014  . Acid reflux 02/28/2014  . Ganglion of tendon 02/28/2014  . Incomplete bladder emptying 02/28/2014  . FOM (frequency of micturition) 02/28/2014  . Osteopenia 02/28/2014  . Sesamoiditis 02/28/2014  . Urgency of micturation 02/28/2014  . Baker's cyst of knee 04/26/2013  . 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 Speech abnormality 02/13/2013  . Raynaud phenomenon 08/16/2012  . Glaucoma suspect 04/19/2012  . Blepharitis 04/19/2012  . Annual physical exam 10/21/2011  . Neck -- back pain- UDS 09/18/2011  . ALLERGIC RHINITIS 12/19/2009  . PELVIC PAIN, CHRONIC 04/03/2009  . Hypogonadism -- per urology 08/11/2007  . OSTEOPOROSIS, IDIOPATHIC 08/11/2007  . HEADACHE 08/11/2007    Asuzena Weis PT, OCS 05/17/2015, 11:00 AM  [facility] [address]  [place], [place], [address] Phone: [phone]   Fax:  [phone]  Name: Erik [name] MRN: [id number] Date of Birth: Feb 10, 1956

## 2015-05-20 ENCOUNTER — Ambulatory Visit: Payer: 59 | Admitting: Physical Therapy

## 2015-05-28 ENCOUNTER — Ambulatory Visit: Payer: 59 | Admitting: Physical Therapy

## 2015-05-28 DIAGNOSIS — M25512 Pain in left shoulder: Secondary | ICD-10-CM | POA: Diagnosis not present

## 2015-05-28 DIAGNOSIS — R29898 Other symptoms and signs involving the musculoskeletal system: Secondary | ICD-10-CM

## 2015-05-28 DIAGNOSIS — M25612 Stiffness of left shoulder, not elsewhere classified: Secondary | ICD-10-CM

## 2015-05-28 NOTE — Therapy (Signed)
[facility] [address]  [place], [place], [address] Phone: [phone]   Fax:  [phone]  Physical Therapy Treatment  Patient Details  Name: Erik [name] MRN: [id number] Date of Birth: [birth year] Referring Provider: Vickki Hearing  Encounter Date: 05/28/2015      PT End of Session - 05/28/15 0802    Visit Number 3   Number of Visits 12   Date for PT Re-Evaluation 06/25/15   PT Start Time 0758   PT Stop Time 0845   PT Time Calculation (min) 47 min      Past Medical History  Diagnosis Date  . ALLERGIC RHINITIS 12/19/2009  . HYPOGONADISM, MALE 08/11/2007    f/u by Dr Thomasene Mohair urology    . OSTEOPOROSIS, IDIOPATHIC 08/11/2007    f/u at [facility] , now improved to osteopenia  . Raynaud's disease /phenomenon 08/16/2012  . Back pain chronic     On hydrocodone-Neurontin  . Blepharitis     B, chronic  . Chronic prostatitis      f/u by urology, has a prostate massage prn  . BPH (benign prostatic hyperplasia)   . Varicose veins   . Rosacea   . Shingles 09-2014  . 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 Cervical fusion  1994    C4-C5-C6 fusion  . Transurethral resection of prostate  07-2009  . Blepharoplasty      B blepharoplasty @ [facility] 08-2011    There were no vitals filed for this visit.  Visit Diagnosis:  Left shoulder pain  Shoulder weakness  Shoulder stiffness, left      Subjective Assessment - 05/28/15 0758    Subjective states L shoulder pain is intermittent in nature but still up to 6-7/10 with some activities.  This AM 3/10 with activity at start of treatment.  Still requires pillows to prop L UE to sleep.  States tape seems to help but did not note benefit with dry needling.   Currently in Pain? Yes   Pain Score 3    Pain Location Shoulder   Pain Orientation Left          TODAY'S TREATMENT TherEx - UBE lvl 1.0  1'/1'  Manual - Supine L GH Grade 2 AP and Caudal glides with gentle PROM.  R Side-Lying STM/TPR to L infraspinatus (very tender and high tone in this area)  TherEx - Supine CW/CCW 0# 10x each. Supine L ER isometric with Yellow TB 10x5" (produced increasing pain into lateral upper arm) R side-lying L ER AROM 10x  Combo [place] - 20%, 5cm, 0.5W/cm2, 1.0MHz with Premod E-stim (10-20Hz ) x 12' to L posterior scap mms with pt prone  Kinesiotape to L shoulder: 5 strips - 75% subacromial space, 50% anterior and posterior delt, 50% posterior delt and into infraspinatus, 50% lateral delt into supraspinatus                   PT Short Term Goals - 05/17/15 1051    PT SHORT TERM GOAL #1   Title pt independent with initial HEP by 05/17/15   Status On-going           PT Long Term Goals - 05/17/15 1051    PT LONG TERM GOAL #1   Title pt independent with advanced HEP as necessary by 06/25/15   Status On-going   PT LONG TERM GOAL #2   Title R Shoulder AROM  WFL all planes without c/o pain by 06/25/15   Status On-going   PT LONG TERM GOAL #3   Title R Shoulder MMT 4+/5 all planes without c/o pain to allow performance of all chores and recreation activities by 06/25/15   Status On-going   PT LONG TERM GOAL #4   Title pt able to sleep without limitation by shoulder pain by 06/25/15   Status On-going   PT LONG TERM GOAL #5   Title pt reports infrequent shoulder pain rating no greater than 3/10 by 06/25/15   Status On-going               Plan - 05/28/15 0853    Clinical Impression Statement states still with significant L shoulder pain with movement rating up to 6/10.  TherEx in clinic still quite limited due to pain.  Denied noting benefit from needling at last treatment but statess combo [place] and E-stim helped today and continues to note benefit from tape to L shoulder.  Added extra strip of tape to cover superior portion of infraspinatus due to this area being most tender to  palpation.   PT Next Visit Plan assess for PR due to pending MD appointment; shoulder stability and RC strengthening as able; AROM as tolerated; manual and modalities PRN; ionto with dex if MD and pt agree   Consulted and Agree with Plan of Care Patient        Problem List Patient Active Problem List   Diagnosis Date Noted  . PCP NOTES >>> 04/29/2015  . Rib pain on right side 11/08/2014  . Binocular vision disorder with diplopia 10/01/2014  . Open angle with borderline findings, high risk 10/01/2014  . 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 Herpes zoster-- 09-2014 09/05/2014  . Anal fissure 02/28/2014  . Chronic prostatitis 02/28/2014  . Abnormal prostate specific antigen 02/28/2014  . Acid reflux 02/28/2014  . Ganglion of tendon 02/28/2014  . Incomplete bladder emptying 02/28/2014  . FOM (frequency of micturition) 02/28/2014  . Osteopenia 02/28/2014  . Sesamoiditis 02/28/2014  . Urgency of micturation 02/28/2014  . Baker's cyst of knee 04/26/2013  . 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 Speech abnormality 02/13/2013  . Raynaud phenomenon 08/16/2012  . Glaucoma suspect 04/19/2012  . Blepharitis 04/19/2012  . Annual physical exam 10/21/2011  . Neck -- back pain- UDS 09/18/2011  . ALLERGIC RHINITIS 12/19/2009  . PELVIC PAIN, CHRONIC 04/03/2009  . Hypogonadism -- per urology 08/11/2007  . OSTEOPOROSIS, IDIOPATHIC 08/11/2007  . HEADACHE 08/11/2007    Glenetta Kiger PT, OCS 05/28/2015, 8:58 AM  [facility] [place], [place], [address] Phone: [phone]   Fax:  [phone]  Name: BASIL GADISON MRN: [id number] Date of Birth: 1955-09-18

## 2015-06-03 ENCOUNTER — Ambulatory Visit: Payer: 59 | Admitting: Physical Therapy

## 2015-06-03 DIAGNOSIS — M25512 Pain in left shoulder: Secondary | ICD-10-CM

## 2015-06-03 DIAGNOSIS — R29898 Other symptoms and signs involving the musculoskeletal system: Secondary | ICD-10-CM

## 2015-06-03 DIAGNOSIS — M25612 Stiffness of left shoulder, not elsewhere classified: Secondary | ICD-10-CM

## 2015-06-03 NOTE — Therapy (Signed)
[facility] [address]  [place], [place], [address] Phone: [phone]   Fax:  [phone]  Physical Therapy Treatment  Patient Details  Name: Erik [name] MRN: [id number] Date of Birth: [birth year] Referring Provider: Vickki Hearing  Encounter Date: 06/03/2015      PT End of Session - 06/03/15 0719    Visit Number 4   Number of Visits 12   Date for PT Re-Evaluation 06/25/15   PT Start Time [id number]   PT Stop Time 0759   PT Time Calculation (min) 42 min      Past Medical History  Diagnosis Date  . ALLERGIC RHINITIS 12/19/2009  . HYPOGONADISM, MALE 08/11/2007    f/u by Dr Thomasene Mohair urology    . OSTEOPOROSIS, IDIOPATHIC 08/11/2007    f/u at [facility] , now improved to osteopenia  . Raynaud's disease /phenomenon 08/16/2012  . Back pain chronic     On hydrocodone-Neurontin  . Blepharitis     B, chronic  . Chronic prostatitis      f/u by urology, has a prostate massage prn  . BPH (benign prostatic hyperplasia)   . Varicose veins   . Rosacea   . Shingles 09-2014  . 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 Cervical fusion  1994    C4-C5-C6 fusion  . Transurethral resection of prostate  07-2009  . Blepharoplasty      B blepharoplasty @ [facility] 08-2011    There were no vitals filed for this visit.  Visit Diagnosis:  Left shoulder pain  Shoulder weakness  Shoulder stiffness, left      Subjective Assessment - 06/03/15 0718    Subjective States still with pain at night stating hasn't noted much improvement with this.  States has noted intermittent pain and weakness to L hand in thenar region and noted short bout of intense pain as well as n/t  in R medial hand in area of ulnar nerve distribution.   Currently in Pain? Yes   Pain Score --  rates AVG pain 4-5/10 at night and 3/10 during the day   Pain Location Shoulder   Pain Orientation Left          TODAY'S TREATMENT TherEx - UBE lvl 1.0 1'/1' Low Row 35# 10x Corner Pec Stretch 3x20" Hooklying Pullover 5# 10x Hooklying B Shoulder Horiz ABD (L isometric) Yellow TB 10x Hooklying B Shoulder ER (L isometric) Yellow TB 10x R side-lying L ER AROM 10x  Manual - Supine L GH Grade 2 AP and Caudal glides with gentle PROM.  Kinesiotape to L shoulder: 5 strips - 75% subacromial space, 50% anterior and posterior delt, 50% posterior delt and into infraspinatus, 50% lateral delt into supraspinatus        PT Short Term Goals - 05/17/15 1051    PT SHORT TERM GOAL #1   Title pt independent with initial HEP by 05/17/15   Status On-going           PT Long Term Goals - 05/17/15 1051    PT LONG TERM GOAL #1   Title pt independent with advanced HEP as necessary by 06/25/15   Status On-going   PT LONG TERM GOAL #2   Title R Shoulder AROM WFL all planes without c/o pain by 06/25/15   Status On-going   PT LONG TERM GOAL #3   Title R Shoulder MMT 4+/5 all planes without c/o pain  to allow performance of all chores and recreation activities by 06/25/15   Status On-going   PT LONG TERM GOAL #4   Title pt able to sleep without limitation by shoulder pain by 06/25/15   Status On-going   PT LONG TERM GOAL #5   Title pt reports infrequent shoulder pain rating no greater than 3/10 by 06/25/15   Status On-going               Plan - 06/03/15 0735    Clinical Impression Statement Better performance with exercises today in that no c/o increased pain with exercises; however, L shoulder AROM assessment revealed large decrease in Flexion (was 130 at initial eval and 95 today).  AROM self-limited due to c/o pain.  PROM as good or better than initial eval and pt with near full flexion AAROM during supine pullover exercise.  No other AROM or MMT assessed due to c/o pain with Flexion AROM causing such poor results.  Pt states that his shoulder pain hasn't improved much since beginning PT  especially in PM when pain is at it's worst.  In addition, pt c/o short bout of R hand pain and n/t over the weekend in what seems ulnar nerve distribution.  Brief assessment to R UE reveals tenderness at ulnar aspect of R wrist as well as pec minor which could indicate mild ulnar nerve impingment. Pt has attended 4 of 12 PT visits to date. Given poor subjective improvement, an MRI may be appropriate to r/o RC vs labral injury.    PT Next Visit Plan Progress shoulder stability and RC strengthening as able; AROM as tolerated; manual and modalities PRN; ionto with dex if MD and pt agree   Consulted and Agree with Plan of Care Patient        Problem List Patient Active Problem List   Diagnosis Date Noted  . PCP NOTES >>> 04/29/2015  . Rib pain on right side 11/08/2014  . Binocular vision disorder with diplopia 10/01/2014  . Open angle with borderline findings, high risk 10/01/2014  . 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 Herpes zoster-- 09-2014 09/05/2014  . Anal fissure 02/28/2014  . Chronic prostatitis 02/28/2014  . Abnormal prostate specific antigen 02/28/2014  . Acid reflux 02/28/2014  . Ganglion of tendon 02/28/2014  . Incomplete bladder emptying 02/28/2014  . FOM (frequency of micturition) 02/28/2014  . Osteopenia 02/28/2014  . Sesamoiditis 02/28/2014  . Urgency of micturation 02/28/2014  . Baker's cyst of knee 04/26/2013  . 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 Speech abnormality 02/13/2013  . Raynaud phenomenon 08/16/2012  . Glaucoma suspect 04/19/2012  . Blepharitis 04/19/2012  . Annual physical exam 10/21/2011  . Neck -- back pain- UDS 09/18/2011  . ALLERGIC RHINITIS 12/19/2009  . PELVIC PAIN, CHRONIC 04/03/2009  . Hypogonadism -- per urology 08/11/2007  . OSTEOPOROSIS, IDIOPATHIC 08/11/2007  . HEADACHE 08/11/2007    Kyndahl Jablon PT, OCS 06/03/2015, 8:38 AM  [facility] [place], [place], [address] Phone: [phone]   Fax:  [phone]  Name: Erik [name] MRN: [id number] Date of Birth: 06-Dec-1955

## 2015-06-05 ENCOUNTER — Encounter: Payer: Self-pay | Admitting: Behavioral Health

## 2015-06-05 ENCOUNTER — Telehealth: Payer: Self-pay | Admitting: Behavioral Health

## 2015-06-05 NOTE — Telephone Encounter (Signed)
Pre-Visit Call completed with patient and chart updated.   Pre-Visit Info documented in Specialty Comments under [employer].    

## 2015-06-06 ENCOUNTER — Encounter: Payer: Self-pay | Admitting: Internal Medicine

## 2015-06-06 ENCOUNTER — Ambulatory Visit (INDEPENDENT_AMBULATORY_CARE_PROVIDER_SITE_OTHER): Payer: 59 | Admitting: Internal Medicine

## 2015-06-06 VITALS — BP 118/72 | HR 70 | Temp 97.7°F | Ht 73.0 in | Wt 193.2 lb

## 2015-06-06 DIAGNOSIS — Z1159 Encounter for screening for other viral diseases: Secondary | ICD-10-CM

## 2015-06-06 DIAGNOSIS — Z0000 Encounter for general adult medical examination without abnormal findings: Secondary | ICD-10-CM | POA: Diagnosis not present

## 2015-06-06 DIAGNOSIS — Z114 Encounter for screening for human immunodeficiency virus [HIV]: Secondary | ICD-10-CM

## 2015-06-06 DIAGNOSIS — Z09 Encounter for follow-up examination after completed treatment for conditions other than malignant neoplasm: Secondary | ICD-10-CM

## 2015-06-06 LAB — CBC WITH DIFFERENTIAL/PLATELET
Basophils Absolute: 0 10*3/uL (ref 0.0–0.1)
Basophils Relative: 0.2 % (ref 0.0–3.0)
EOS PCT: 0.1 % (ref 0.0–5.0)
Eosinophils Absolute: 0 10*3/uL (ref 0.0–0.7)
HCT: 46.3 % (ref 39.0–52.0)
HEMOGLOBIN: 15.5 g/dL (ref 13.0–17.0)
LYMPHS PCT: 21.1 % (ref 12.0–46.0)
Lymphs Abs: 1.4 10*3/uL (ref 0.7–4.0)
MCHC: 33.6 g/dL (ref 30.0–36.0)
MCV: 90.4 fl (ref 78.0–100.0)
MONOS PCT: 8.1 % (ref 3.0–12.0)
Monocytes Absolute: 0.5 10*3/uL (ref 0.1–1.0)
Neutro Abs: 4.7 10*3/uL (ref 1.4–7.7)
Neutrophils Relative %: 70.5 % (ref 43.0–77.0)
Platelets: 228 10*3/uL (ref 150.0–400.0)
RBC: 5.12 Mil/uL (ref 4.22–5.81)
RDW: 13.6 % (ref 11.5–15.5)
WBC: 6.7 10*3/uL (ref 4.0–10.5)

## 2015-06-06 LAB — COMPREHENSIVE METABOLIC PANEL
ALT: 19 U/L (ref 0–53)
AST: 18 U/L (ref 0–37)
Albumin: 4.1 g/dL (ref 3.5–5.2)
Alkaline Phosphatase: 49 U/L (ref 39–117)
BILIRUBIN TOTAL: 1.6 mg/dL — AB (ref 0.2–1.2)
BUN: 25 mg/dL — AB (ref 6–23)
CO2: 30 mEq/L (ref 19–32)
CREATININE: 0.75 mg/dL (ref 0.40–1.50)
Calcium: 9 mg/dL (ref 8.4–10.5)
Chloride: 103 mEq/L (ref 96–112)
GFR: 112.47 mL/min (ref >60.00)
GLUCOSE: 90 mg/dL (ref 70–99)
Potassium: 3.7 mEq/L (ref 3.5–5.1)
SODIUM: 140 meq/L (ref 135–145)
TOTAL PROTEIN: 6.4 g/dL (ref 6.0–8.3)

## 2015-06-06 LAB — LIPID PANEL
Cholesterol: 199 mg/dL (ref 0–200)
HDL: 48.3 mg/dL (ref >39.00)
LDL Cholesterol: 137 mg/dL — ABNORMAL HIGH (ref 0–99)
NONHDL: 150.93
Total CHOL/HDL Ratio: 4
Triglycerides: 72 mg/dL (ref 0.0–149.0)
VLDL: 14.4 mg/dL (ref 0.0–40.0)

## 2015-06-06 LAB — TSH: TSH: 1.7 u[IU]/mL (ref 0.35–4.50)

## 2015-06-06 NOTE — Progress Notes (Signed)
Subjective:    Patient ID: Erik [name], male    DOB: [birth year], 59 y.o.   MRN: [id number]  DOS:  06/06/2015 Type of visit - description : CPX Interval history: No major concerns    Review of Systems Constitutional: No fever. No chills. No unexplained wt changes. No unusual sweats  HEENT: No dental problems, no ear discharge, no facial swelling, no voice changes. No eye discharge, no eye  redness , no  intolerance to light . Occasionally waxing ear discharge without pain or other symptoms  Respiratory: No wheezing , no  difficulty breathing. No cough , in the mornings feels a lot of pooling of mucus in the throat. Cardiovascular: No CP, no leg swelling , no  Palpitations  GI: no nausea, no vomiting, no diarrhea , no  abdominal pain.  No blood in the stools. No dysphagia, no odynophagia    Endocrine: No polyphagia, no polyuria , no polydipsia  GU: No dysuria, gross hematuria, difficulty urinating. No urinary urgency, no frequency.  Musculoskeletal: No joint swellings or unusual aches or pains. Doing physical therapy for shoulder pain, gradually improving.  Skin: No change in the color of the skin, palor , no  Rash  Allergic, immunologic: No environmental allergies , no  food allergies  Neurological: No dizziness no  syncope. No headaches. No diplopia, no slurred, no slurred speech, no motor deficits, no facial  Numbness  Hematological: No enlarged lymph nodes, no easy bruising , no unusual bleedings  Psychiatry: No suicidal ideas, no hallucinations, no beavior problems, no confusion.  No unusual/severe anxiety, no depression   Past Medical History  Diagnosis Date  . ALLERGIC RHINITIS 12/19/2009  . HYPOGONADISM, MALE 08/11/2007    f/u by Dr Thomasene Mohair urology    . OSTEOPOROSIS, IDIOPATHIC 08/11/2007    f/u at [facility] , now improved to osteopenia  . Raynaud's disease /phenomenon 08/16/2012  . Back pain chronic     On hydrocodone-Neurontin  . Blepharitis     B, chronic  .  Chronic prostatitis      f/u by urology, has a prostate massage prn  . BPH (benign prostatic hyperplasia)   . Varicose veins   . Rosacea   . Shingles 09-2014  . 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 Groin injury     Past Surgical History  Procedure Laterality Date  . Cervical fusion  1994    C4-C5-C6 fusion  . Transurethral resection of prostate  07-2009  . Blepharoplasty      B blepharoplasty @ [facility] 08-2011    Social History   Social History  . Marital Status: Married    Spouse Name: N/A  . Number of Children: 3  . Years of Education: N/A   Occupational History  . Media planner for [employer]      Social History Main Topics  . Smoking status: Never Smoker   . Smokeless tobacco: Never Used  . Alcohol Use: No  . Drug Use: No  . Sexual Activity: Not on file   Other Topics Concern  . Not on file   Social History Narrative   Lives with wife.  They have three children.   Daughter is an Ship broker       Family History  Problem Relation Age of Onset  . COPD Father     Died, 71  . Hypertension Father   . Stroke Father   . Diabetes Mother     M, GP  . Hypertension Mother   .  CAD Mother     F, M, GP  . Colon polyps Mother   . Cancer Mother     Ovarian cancer  . Hypertension Sister   . Fibromyalgia Sister   . Hyperlipidemia Sister   . Prostate cancer Neg Hx   . Colon cancer Maternal Aunt        Medication List       This list is accurate as of: 06/06/15 11:59 PM.  Always use your most recent med list.               amLODipine 5 MG tablet  Commonly known as:  NORVASC  Take 5 mg by mouth daily. Prn Reynaud's     cyclobenzaprine 10 MG tablet  Commonly known as:  FLEXERIL  Take 1 tablet (10 mg total) by mouth at bedtime as needed for muscle spasms.     HYDROcodone-acetaminophen 5-325 MG tablet  Commonly known as:  NORCO  Take 1 tablet by mouth every 6 (six) hours as needed for moderate pain.     loratadine 10 MG tablet    Commonly known as:  CLARITIN  Take 10 mg by mouth daily.     omeprazole 40 MG capsule  Commonly known as:  PRILOSEC  Take 1 capsule (40 mg total) by mouth daily.     risedronate 150 MG tablet  Commonly known as:  ACTONEL  Take 150 mg by mouth every 30 (thirty) days.     tadalafil 5 MG tablet  Commonly known as:  CIALIS  Take 5 mg by mouth daily.     testosterone cypionate 100 MG/ML injection  Commonly known as:  DEPOTESTOTERONE CYPIONATE  Inject into the muscle every 14 (fourteen) days. For IM use only           Objective:   Physical Exam BP 118/72 mmHg  Pulse 70  Temp(Src) 97.7 F (36.5 C) (Oral)  Ht 6\' 1"  (1.854 m)  Wt 193 lb 4 oz (87.658 kg)  BMI 25.50 kg/m2  SpO2 99% General:   Well developed, well nourished . NAD.  HEENT:  Normocephalic . Face symmetric, atraumatic. Ear canals and TMs completely normal Neck: No thyromegaly Lungs:  CTA B Normal respiratory effort, no intercostal retractions, no accessory muscle use. Heart: RRR,  no murmur.  no pretibial edema bilaterally  Abdomen:  Not distended, soft, non-tender. No rebound or rigidity. No mass,organomegaly Skin: Not pale. Not jaundice Neurologic:  alert & oriented X3.  Speech normal, gait appropriate for age and unassisted Psych--  Cognition and judgment appear intact.  Cooperative with normal attention span and concentration.  Behavior appropriate. No anxious or depressed appearing.    Assessment & Plan:   Assessment > Osteoporosis, idiopathic, follow-up at [facility]. MSK -- s/p Cervical fusion --Chronic back pain: Hydrocodone (used to be on gabapentin ) GU: Dr. Thomasene Mohair, urology --BPH, TURP 2011 --Chronic prostatitis --Hypogonadism, per urology Rosacea Chronic blepharitis Shingles 09-2014 H/o Raynaud phenomena 2013 : Amlodipine when necessary   PLAN Chronic medical problems follow-up elsewhere. Mucus pooling in the throat in the morning, likely allergies, continue with OTC Claritin and add  Flonase

## 2015-06-06 NOTE — Assessment & Plan Note (Addendum)
Td 2013 Had a FLU SHOT  Zostavax -- 2014  Colonoscopy Dr Waverly Ferrari, first Cscope [id number] (polyp) , then 01/02/2008 (tubular adenoma),  had scheduled a Cscope at [facility] but had to cancel. Plans to reschedule  PSA per urology Diet-- average, counseled exercise-- doing very well  Labs  : Hep C, HIV, CMP, FLP, CBC, TSH

## 2015-06-06 NOTE — Progress Notes (Signed)
Pre visit review using our clinic review tool, if applicable. No additional management support is needed unless otherwise documented below in the visit note. 

## 2015-06-06 NOTE — Patient Instructions (Addendum)
Get your blood work before you leave     Next visit  for a  complete physical exam in one year, fasting     Please schedule an appointment at the front desk  

## 2015-06-07 ENCOUNTER — Ambulatory Visit: Payer: 59 | Attending: Sports Medicine | Admitting: Physical Therapy

## 2015-06-07 DIAGNOSIS — R29898 Other symptoms and signs involving the musculoskeletal system: Secondary | ICD-10-CM | POA: Insufficient documentation

## 2015-06-07 DIAGNOSIS — M25512 Pain in left shoulder: Secondary | ICD-10-CM | POA: Diagnosis not present

## 2015-06-07 DIAGNOSIS — M25612 Stiffness of left shoulder, not elsewhere classified: Secondary | ICD-10-CM | POA: Diagnosis present

## 2015-06-07 LAB — HIV ANTIBODY (ROUTINE TESTING W REFLEX): HIV: NONREACTIVE

## 2015-06-07 LAB — HEPATITIS C ANTIBODY: HCV Ab: NEGATIVE

## 2015-06-07 NOTE — Therapy (Signed)
[facility] [address]  [place], [place], [address] Phone: [phone]   Fax:  [phone]  Physical Therapy Treatment  Patient Details  Name: Erik [name] MRN: [id number] Date of Birth: [birth year] Referring Provider: Vickki Hearing  Encounter Date: 06/07/2015      PT End of Session - 06/07/15 0723    Visit Number 5   Number of Visits 12   Date for PT Re-Evaluation 06/25/15   PT Start Time 0720   PT Stop Time 0756   PT Time Calculation (min) 36 min      Past Medical History  Diagnosis Date  . ALLERGIC RHINITIS 12/19/2009  . HYPOGONADISM, MALE 08/11/2007    f/u by Dr Thomasene Mohair urology    . OSTEOPOROSIS, IDIOPATHIC 08/11/2007    f/u at [facility] , now improved to osteopenia  . Raynaud's disease /phenomenon 08/16/2012  . Back pain chronic     On hydrocodone-Neurontin  . Blepharitis     B, chronic  . Chronic prostatitis      f/u by urology, has a prostate massage prn  . BPH (benign prostatic hyperplasia)   . Varicose veins   . Rosacea   . Shingles 09-2014  . 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 Groin injury     Past Surgical History  Procedure Laterality Date  . Cervical fusion  1994    C4-C5-C6 fusion  . Transurethral resection of prostate  07-2009  . Blepharoplasty      B blepharoplasty @ [facility] 08-2011    There were no vitals filed for this visit.  Visit Diagnosis:  Left shoulder pain  Shoulder weakness  Shoulder stiffness, left      Subjective Assessment - 06/07/15 0721    Subjective states underwent cortisone injection on Tuesday at [facility]. States was very sore for 1-2 days following this but states is now feeling much better.  States slept pretty good last night and pain-free upon waking this AM and still no c/o pain today.  Pain yesterday was 2-3/10.   Currently in Pain? No/denies            [facility] PT Assessment - 06/07/15 0001    Assessment   Next MD Visit 06/26/15            TODAY'S TREATMENT TherEx - UBE lvl 2.5 2'/2' R Side-Lying L GH ER 0# 15x, 1# 15x R Side-Lying L GH CW/CCW in 90 ABD 2# 15x each Hooklying Pullover 5# 15x Hooklying L GH CW/CCW in 90 Flex 4# 15x each Hooklying B Shoulder Horiz ABD Yellow TB 15x Low Row 35# 15x Body Blade L ABD/ADD and ER/IR  2x20" each Corner Pec Stretch 3x20"  Kinesiotape to L shoulder: 5 strips - 75% subacromial space, 50% anterior and posterior delt, 50% posterior delt and into infraspinatus, 50% lateral delt into supraspinatus                    PT Short Term Goals - 06/07/15 0758    PT SHORT TERM GOAL #1   Title pt independent with initial HEP by 05/17/15   Status Achieved           PT Long Term Goals - 05/17/15 1051    PT LONG TERM GOAL #1   Title pt independent with advanced HEP as necessary by 06/25/15   Status On-going   PT LONG TERM GOAL #2   Title R Shoulder AROM WFL all planes without c/o pain  by 06/25/15   Status On-going   PT LONG TERM GOAL #3   Title R Shoulder MMT 4+/5 all planes without c/o pain to allow performance of all chores and recreation activities by 06/25/15   Status On-going   PT LONG TERM GOAL #4   Title pt able to sleep without limitation by shoulder pain by 06/25/15   Status On-going   PT LONG TERM GOAL #5   Title pt reports infrequent shoulder pain rating no greater than 3/10 by 06/25/15   Status On-going               Plan - 06/07/15 0726    Clinical Impression Statement Pt underwent cortisone injection on Tuesday.  Initially with increased pain but now pain-free.  Was able to perform all exercises in clinic with only noting fatigue, no pain.  Kept intenstiy low for now even though no pain.  Pt states he will be out of town 15-20 Dec and has f/u on 26 Jun 2015 with [facility].  He has 4 PT visits scheduled between now and going out of town so hopefully will progress very well in that time.   PT Next Visit Plan Progress shoulder stability and RC  strengthening as able; AROM as tolerated; manual and modalities PRN   Consulted and Agree with Plan of Care Patient        Problem List Patient Active Problem List   Diagnosis Date Noted  . PCP NOTES >>> 04/29/2015  . Rib pain on right side 11/08/2014  . Binocular vision disorder with diplopia 10/01/2014  . Open angle with borderline findings, high risk 10/01/2014  . 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 Herpes zoster-- 09-2014 09/05/2014  . Anal fissure 02/28/2014  . Chronic prostatitis 02/28/2014  . Abnormal prostate specific antigen 02/28/2014  . Acid reflux 02/28/2014  . Ganglion of tendon 02/28/2014  . Incomplete bladder emptying 02/28/2014  . FOM (frequency of micturition) 02/28/2014  . Osteopenia 02/28/2014  . Sesamoiditis 02/28/2014  . Urgency of micturation 02/28/2014  . Baker's cyst of knee 04/26/2013  . 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 Speech abnormality 02/13/2013  . Raynaud phenomenon 08/16/2012  . Glaucoma suspect 04/19/2012  . Blepharitis 04/19/2012  . Annual physical exam 10/21/2011  . Neck -- back pain- UDS 09/18/2011  . ALLERGIC RHINITIS 12/19/2009  . PELVIC PAIN, CHRONIC 04/03/2009  . Hypogonadism -- per urology 08/11/2007  . OSTEOPOROSIS, IDIOPATHIC 08/11/2007  . HEADACHE 08/11/2007    Ashden Sonnenberg PT, OCS 06/07/2015, 8:01 AM  [facility] [address]  [place], [place], [address] Phone: [phone]   Fax:  [phone]  Name: Erik [name] MRN: [id number] Date of Birth: [birth year]

## 2015-06-07 NOTE — Assessment & Plan Note (Signed)
Chronic medical problems follow-up elsewhere. Mucus pooling in the throat in the morning, likely allergies, continue with OTC Claritin and add Flonase

## 2015-06-10 ENCOUNTER — Ambulatory Visit: Payer: 59 | Admitting: Physical Therapy

## 2015-06-10 DIAGNOSIS — M25512 Pain in left shoulder: Secondary | ICD-10-CM | POA: Diagnosis not present

## 2015-06-10 DIAGNOSIS — M25612 Stiffness of left shoulder, not elsewhere classified: Secondary | ICD-10-CM

## 2015-06-10 DIAGNOSIS — R29898 Other symptoms and signs involving the musculoskeletal system: Secondary | ICD-10-CM

## 2015-06-10 NOTE — Therapy (Signed)
[facility] [address]  [place], [place], [address] Phone: [phone]   Fax:  [phone]  Physical Therapy Treatment  Patient Details  Name: Erik [name] MRN: [id number] Date of Birth: Mar 24, 1956 Referring Provider: Vickki Hearing  Encounter Date: 06/10/2015      PT End of Session - 06/10/15 0719    Visit Number 6   Number of Visits 12   Date for PT Re-Evaluation 06/25/15   PT Start Time 0718   PT Stop Time 0810   PT Time Calculation (min) 52 min      Past Medical History  Diagnosis Date  . ALLERGIC RHINITIS 12/19/2009  . HYPOGONADISM, MALE 08/11/2007    f/u by Dr Thomasene Mohair urology    . OSTEOPOROSIS, IDIOPATHIC 08/11/2007    f/u at [facility] , now improved to osteopenia  . Raynaud's disease /phenomenon 08/16/2012  . Back pain chronic     On hydrocodone-Neurontin  . Blepharitis     B, chronic  . Chronic prostatitis      f/u by urology, has a prostate massage prn  . BPH (benign prostatic hyperplasia)   . Varicose veins   . Rosacea   . Shingles 09-2014  . 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 Groin injury     Past Surgical History  Procedure Laterality Date  . Cervical fusion  1994    C4-C5-C6 fusion  . Transurethral resection of prostate  07-2009  . Blepharoplasty      B blepharoplasty @ [facility] 08-2011    There were no vitals filed for this visit.  Visit Diagnosis:  Left shoulder pain  Shoulder weakness  Shoulder stiffness, left      Subjective Assessment - 06/10/15 0719    Subjective States shoulder achie Friday PM.  States had to gather shopping carts in parking lot Friday PM noting some pain with this.  Has had difficulty sleeping past 3 nights due to pain.  States pain up to 5-6/10 at night and at times notes stabbing pain in shoulder and palm of R hand.   Currently in Pain? Yes   Pain Score 2    Pain Location Shoulder   Pain Orientation Left   Pain Descriptors / Indicators  Aching;Stabbing   Pain Radiating Towards lateral shoulder extending at times to hand         TODAY'S TREATMENT:  Combo [place] - 20%, 5cm, 0.5W/cm2, 1.0MHz with Premod E-stim (10-20Hz ) x 12' to L supraspinatus, infraspinatus, and subacromial space with pt R side-lying  Kinesiotape to L shoulder: 5 strips - 75% subacromial space, 50% anterior and posterior delt, 50% posterior delt and into infraspinatus, 50% lateral delt into supraspinatus   Vasopneumatic compression - L shoulder, low pressure. 38dg, 15'           PT Short Term Goals - 06/07/15 0758    PT SHORT TERM GOAL #1   Title pt independent with initial HEP by 05/17/15   Status Achieved           PT Long Term Goals - 05/17/15 1051    PT LONG TERM GOAL #1   Title pt independent with advanced HEP as necessary by 06/25/15   Status On-going   PT LONG TERM GOAL #2   Title R Shoulder AROM WFL all planes without c/o pain by 06/25/15   Status On-going   PT LONG TERM GOAL #3   Title R Shoulder MMT 4+/5 all planes without c/o pain to  allow performance of all chores and recreation activities by 06/25/15   Status On-going   PT LONG TERM GOAL #4   Title pt able to sleep without limitation by shoulder pain by 06/25/15   Status On-going   PT LONG TERM GOAL #5   Title pt reports infrequent shoulder pain rating no greater than 3/10 by 06/25/15   Status On-going               Plan - 06/10/15 0758    Clinical Impression Statement pt felt good last week following injection on Tuesday but states pain returned.  States has had difficulty sleeping past 3 nights due to pain.  Today's treatment limited to modalities to help decrease pain, will return to exercise as able.   PT Next Visit Plan Progress shoulder stability and RC strengthening as able; AROM as tolerated; manual and modalities PRN   Consulted and Agree with Plan of Care Patient        Problem List Patient Active Problem List   Diagnosis Date Noted  . PCP  NOTES >>> 04/29/2015  . Rib pain on right side 11/08/2014  . Binocular vision disorder with diplopia 10/01/2014  . Open angle with borderline findings, high risk 10/01/2014  . 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 Herpes zoster-- 09-2014 09/05/2014  . Anal fissure 02/28/2014  . Chronic prostatitis 02/28/2014  . Abnormal prostate specific antigen 02/28/2014  . Acid reflux 02/28/2014  . Ganglion of tendon 02/28/2014  . Incomplete bladder emptying 02/28/2014  . FOM (frequency of micturition) 02/28/2014  . Osteopenia 02/28/2014  . Sesamoiditis 02/28/2014  . Urgency of micturation 02/28/2014  . Baker's cyst of knee 04/26/2013  . 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 Speech abnormality 02/13/2013  . Raynaud phenomenon 08/16/2012  . Glaucoma suspect 04/19/2012  . Blepharitis 04/19/2012  . Annual physical exam 10/21/2011  . Neck -- back pain- UDS 09/18/2011  . ALLERGIC RHINITIS 12/19/2009  . PELVIC PAIN, CHRONIC 04/03/2009  . Hypogonadism -- per urology 08/11/2007  . OSTEOPOROSIS, IDIOPATHIC 08/11/2007  . HEADACHE 08/11/2007    Agness Sibrian PT, OCS 06/10/2015, 8:00 AM  [facility] [address]  [place], [place], [address] Phone: [phone]   Fax:  [phone]  Name: DEVANSH KEGEL MRN: [id number] Date of Birth: 01/12/1956

## 2015-06-12 ENCOUNTER — Ambulatory Visit: Payer: 59 | Admitting: Physical Therapy

## 2015-06-12 DIAGNOSIS — M25512 Pain in left shoulder: Secondary | ICD-10-CM | POA: Diagnosis not present

## 2015-06-12 DIAGNOSIS — M25612 Stiffness of left shoulder, not elsewhere classified: Secondary | ICD-10-CM

## 2015-06-12 DIAGNOSIS — R29898 Other symptoms and signs involving the musculoskeletal system: Secondary | ICD-10-CM

## 2015-06-12 NOTE — Therapy (Signed)
[facility] [address]  [place], [place], [address] Phone: [phone]   Fax:  [phone]  Physical Therapy Treatment  Patient Details  Name: Erik [name] MRN: [id number] Date of Birth: 02-Dec-1955 Referring Provider: Vickki Hearing  Encounter Date: 06/12/2015      PT End of Session - 06/12/15 0809    Visit Number 7   Number of Visits 12   Date for PT Re-Evaluation 06/25/15   PT Start Time 0804   PT Stop Time [id number]   PT Time Calculation (min) 51 min      Past Medical History  Diagnosis Date  . ALLERGIC RHINITIS 12/19/2009  . HYPOGONADISM, MALE 08/11/2007    f/u by Dr Thomasene Mohair urology    . OSTEOPOROSIS, IDIOPATHIC 08/11/2007    f/u at [facility] , now improved to osteopenia  . Raynaud's disease /phenomenon 08/16/2012  . Back pain chronic     On hydrocodone-Neurontin  . Blepharitis     B, chronic  . Chronic prostatitis      f/u by urology, has a prostate massage prn  . BPH (benign prostatic hyperplasia)   . Varicose veins   . Rosacea   . Shingles 09-2014  . 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 Groin injury     Past Surgical History  Procedure Laterality Date  . Cervical fusion  1994    C4-C5-C6 fusion  . Transurethral resection of prostate  07-2009  . Blepharoplasty      B blepharoplasty @ [facility] 08-2011    There were no vitals filed for this visit.  Visit Diagnosis:  Left shoulder pain  Shoulder weakness  Shoulder stiffness, left      Subjective Assessment - 06/12/15 0807    Subjective states received script for Tramadol from [facility] yesterday and took for 1st time last night.  States did seem to help with sleeping but feels nausious and cold sweats upon waking this AM believing due to medication and states doesn't plan on taking Tramadol any more.   Currently in Pain? Yes   Pain Score 1   2-3/10 yesterday, 1/10 so far today   Pain Location Shoulder   Pain Orientation Left         TODAY'S TREATMENT TherEx - R Side-Lying L GH ER 2# 12x (stopped due to pain) R Side-Lying L ABD with 90 elbow flexion 4x (stopped due to pain) Hooklying Pullover 2# 10x  Manual - attempted grade 1-2 AP mobs in supine but pt c/o increased pain on 2 occasions to stopped  TherEx - hands on counter in slight FW lean R/L wt shifting for shoulder stability 10x Hands on counter in slight FW lean partial p/u plus 10x Hand on wall shoulder flexion slide 10x (anterior shoulder pain)  Manual - TPR R anterior delt (large trigger point noted) [place] - 20%, 5cm, 0.5W/cm2, 3.3Mhz, 6' R ant delt Manual - dry needling (informed consent given prior to treatment) performed to L anterior deltoid - decreased tissue density noted with treatment           PT Short Term Goals - 06/07/15 0758    PT SHORT TERM GOAL #1   Title pt independent with initial HEP by 05/17/15   Status Achieved           PT Long Term Goals - 05/17/15 1051    PT LONG TERM GOAL #1   Title pt independent with advanced HEP as necessary by 06/25/15  Status On-going   PT LONG TERM GOAL #2   Title R Shoulder AROM WFL all planes without c/o pain by 06/25/15   Status On-going   PT LONG TERM GOAL #3   Title R Shoulder MMT 4+/5 all planes without c/o pain to allow performance of all chores and recreation activities by 06/25/15   Status On-going   PT LONG TERM GOAL #4   Title pt able to sleep without limitation by shoulder pain by 06/25/15   Status On-going   PT LONG TERM GOAL #5   Title pt reports infrequent shoulder pain rating no greater than 3/10 by 06/25/15   Status On-going               Plan - 06/12/15 0810    Clinical Impression Statement Pt states did not sleep well night following last treatment but did sleep well last night due to taking Tramadol; however, he c/o nausea and cold sweats this AM so does not plan on taking Tramadol again.  Attempted return to exercise today but not well tolerated.  Large  trigger point noted in L anterior deltoid.  Performed US and manual TPR but didn't seem to improve so then dry needling performed.  Some decrease in density noted following needling which will contribute to decreased pain throughout hte day.   PT Next Visit Plan Progress shoulder stability and RC strengthening as able; AROM as tolerated; manual and modalities PRN.  Begin ionto next week if still in pain.   Consulted and Agree with Plan of Care Patient        Problem List Patient Active Problem List   Diagnosis Date Noted  . PCP NOTES >>> 04/29/2015  . Rib pain on right side 11/08/2014  . Binocular vision disorder with diplopia 10/01/2014  . Open angle with borderline findings, high risk 10/01/2014  . 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 Herpes zoster-- 09-2014 09/05/2014  . Anal fissure 02/28/2014  . Chronic prostatitis 02/28/2014  . Abnormal prostate specific antigen 02/28/2014  . Acid reflux 02/28/2014  . Ganglion of tendon 02/28/2014  . Incomplete bladder emptying 02/28/2014  . FOM (frequency of micturition) 02/28/2014  . Osteopenia 02/28/2014  . Sesamoiditis 02/28/2014  . Urgency of micturation 02/28/2014  . Baker's cyst of knee 04/26/2013  . 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 Speech abnormality 02/13/2013  . Raynaud phenomenon 08/16/2012  . Glaucoma suspect 04/19/2012  . Blepharitis 04/19/2012  . Annual physical exam 10/21/2011  . Neck -- back pain- UDS 09/18/2011  . ALLERGIC RHINITIS 12/19/2009  . PELVIC PAIN, CHRONIC 04/03/2009  . Hypogonadism -- per urology 08/11/2007  . OSTEOPOROSIS, IDIOPATHIC 08/11/2007  . HEADACHE 08/11/2007    Elby Blackwelder PT, OCS 06/12/2015, 9:17 AM  [facility] [address]  [place], [place], [address] Phone: [phone]   Fax:  [phone]  Name: Erik [name] MRN: [id number] Date of Birth:  02-Oct-1955

## 2015-06-17 ENCOUNTER — Ambulatory Visit: Payer: 59 | Admitting: Physical Therapy

## 2015-06-17 DIAGNOSIS — M25512 Pain in left shoulder: Secondary | ICD-10-CM

## 2015-06-17 DIAGNOSIS — R29898 Other symptoms and signs involving the musculoskeletal system: Secondary | ICD-10-CM

## 2015-06-17 DIAGNOSIS — M25612 Stiffness of left shoulder, not elsewhere classified: Secondary | ICD-10-CM

## 2015-06-17 NOTE — Therapy (Signed)
[facility] [address]  [place], [place], [address] Phone: [phone]   Fax:  [phone]  Physical Therapy Treatment  Patient Details  Name: Erik [name] MRN: [id number] Date of Birth: 1956/05/14 Referring Provider: Vickki Hearing  Encounter Date: 06/17/2015      PT End of Session - 06/17/15 0719    Visit Number 8   Number of Visits 12   Date for PT Re-Evaluation 06/25/15   PT Start Time 0718   PT Stop Time 0805   PT Time Calculation (min) 47 min      Past Medical History  Diagnosis Date  . ALLERGIC RHINITIS 12/19/2009  . HYPOGONADISM, MALE 08/11/2007    f/u by Dr Thomasene Mohair urology    . OSTEOPOROSIS, IDIOPATHIC 08/11/2007    f/u at [facility] , now improved to osteopenia  . Raynaud's disease /phenomenon 08/16/2012  . Back pain chronic     On hydrocodone-Neurontin  . Blepharitis     B, chronic  . Chronic prostatitis      f/u by urology, has a prostate massage prn  . BPH (benign prostatic hyperplasia)   . Varicose veins   . Rosacea   . Shingles 09-2014  . 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 Groin injury     Past Surgical History  Procedure Laterality Date  . Cervical fusion  1994    C4-C5-C6 fusion  . Transurethral resection of prostate  07-2009  . Blepharoplasty      B blepharoplasty @ [facility] 08-2011    There were no vitals filed for this visit.  Visit Diagnosis:  Left shoulder pain  Shoulder weakness  Shoulder stiffness, left      Subjective Assessment - 06/17/15 0719    Subjective States doesn't feel as though is making any significant improvement.  States is not taking tramadol due to side-effects reported last treatment.  States shoulder is 2/10 with light activity with neutral shoulder but if performs reaching activities pain increases to 5/10 and stays elevated for some time after the activity.  Intermittent difficulty sleeping due to pain.   Currently in Pain? Yes   Pain Score  2   5/10 with reaching   Pain Location Shoulder   Pain Orientation Left            TODAY'S TREATMENT Combo - [place]: 5cm, 0.5w/cm2, 1.0MHz, 20% with Premod E-stim (10-20Hz ) to lateral and posterior L shoulder x 12'   Vasopneumatic compression - L shoulder, low pressure, 38 dg, 15'  Iontophoresis patch (4-6 hour) to L lateral shoulder with 47mA.min dex (pt advised to remove by 2PM)          PT Education - 06/17/15 0754    Education provided Yes   Education Details EVS shoulder brace information   Person(s) Educated Patient   Methods Explanation;Handout          PT Short Term Goals - 06/07/15 0758    PT SHORT TERM GOAL #1   Title pt independent with initial HEP by 05/17/15   Status Achieved           PT Long Term Goals - 05/17/15 1051    PT LONG TERM GOAL #1   Title pt independent with advanced HEP as necessary by 06/25/15   Status On-going   PT LONG TERM GOAL #2   Title R Shoulder AROM WFL all planes without c/o pain by 06/25/15   Status On-going   PT LONG TERM GOAL #  3   Title R Shoulder MMT 4+/5 all planes without c/o pain to allow performance of all chores and recreation activities by 06/25/15   Status On-going   PT LONG TERM GOAL #4   Title pt able to sleep without limitation by shoulder pain by 06/25/15   Status On-going   PT LONG TERM GOAL #5   Title pt reports infrequent shoulder pain rating no greater than 3/10 by 06/25/15   Status On-going               Plan - 06/17/15 0754    Clinical Impression Statement pt denies noting benefit with PT overall.  States shoulder function limited by pain.  States exercises seem problematic no beneficial.  Today's treatment limited to modalities as well as ionto patch.  Showed pt option of shoulder brace.  Told him there are a variety available but gave print out for EVS brace since this is one I have personal experience with and is quite affordable.   PT Next Visit Plan continue modalities and ionto for  one more treatment then pt on vaction and has f/u with MD   Consulted and Agree with Plan of Care Patient        Problem List Patient Active Problem List   Diagnosis Date Noted  . PCP NOTES >>> 04/29/2015  . Rib pain on right side 11/08/2014  . Binocular vision disorder with diplopia 10/01/2014  . Open angle with borderline findings, high risk 10/01/2014  . 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 Herpes zoster-- 09-2014 09/05/2014  . Anal fissure 02/28/2014  . Chronic prostatitis 02/28/2014  . Abnormal prostate specific antigen 02/28/2014  . Acid reflux 02/28/2014  . Ganglion of tendon 02/28/2014  . Incomplete bladder emptying 02/28/2014  . FOM (frequency of micturition) 02/28/2014  . Osteopenia 02/28/2014  . Sesamoiditis 02/28/2014  . Urgency of micturation 02/28/2014  . Baker's cyst of knee 04/26/2013  . 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 Speech abnormality 02/13/2013  . Raynaud phenomenon 08/16/2012  . Glaucoma suspect 04/19/2012  . Blepharitis 04/19/2012  . Annual physical exam 10/21/2011  . Neck -- back pain- UDS 09/18/2011  . ALLERGIC RHINITIS 12/19/2009  . PELVIC PAIN, CHRONIC 04/03/2009  . Hypogonadism -- per urology 08/11/2007  . OSTEOPOROSIS, IDIOPATHIC 08/11/2007  . HEADACHE 08/11/2007    Jaxie Racanelli PT, OCS 06/17/2015, 8:06 AM  [facility] [address]  [place], [place], [address] Phone: [phone]   Fax:  [phone]  Name: Erik [name] MRN: [id number] Date of Birth: 10/26/1955

## 2015-06-18 ENCOUNTER — Telehealth: Payer: Self-pay | Admitting: Internal Medicine

## 2015-06-18 MED ORDER — HYDROCODONE-ACETAMINOPHEN 5-325 MG PO TABS: 1.0000 | ORAL_TABLET | Freq: Four times a day (QID) | ORAL | Status: DC | PRN

## 2015-06-18 NOTE — Telephone Encounter (Signed)
Okay #30, 2 prescriptions 

## 2015-06-18 NOTE — Telephone Encounter (Signed)
Controlled Substance Contract printed with Rx and placed at front desk for pick up at Pt's convenience. Pt informed via MyChart.

## 2015-06-18 NOTE — Telephone Encounter (Signed)
Pt is requesting refill on Hydrocodone.  Last OV: 06/06/2015 Last Fill: 11/29/2014 #30 and 0RF UDS: 05/17/2014 Low risk  Please advise.

## 2015-06-18 NOTE — Telephone Encounter (Signed)
Rx printed, awaiting MD signature.  

## 2015-06-19 ENCOUNTER — Ambulatory Visit: Payer: 59 | Admitting: Physical Therapy

## 2015-06-19 DIAGNOSIS — R29898 Other symptoms and signs involving the musculoskeletal system: Secondary | ICD-10-CM

## 2015-06-19 DIAGNOSIS — M25512 Pain in left shoulder: Secondary | ICD-10-CM | POA: Diagnosis not present

## 2015-06-19 DIAGNOSIS — M25612 Stiffness of left shoulder, not elsewhere classified: Secondary | ICD-10-CM

## 2015-06-19 NOTE — Therapy (Addendum)
[facility] [address]  [place], [place], [address] Phone: [phone]   Fax:  [phone]  Physical Therapy Treatment  Patient Details  Name: Erik [name] MRN: [id number] Date of Birth: Sep 10, 1955 Referring Provider: Vickki [name]  Encounter Date: 06/19/2015      PT End of Session - 06/19/15 0720    Visit Number 9   Number of Visits 12   Date for PT Re-Evaluation 06/25/15   PT Start Time 0721   PT Stop Time 0758   PT Time Calculation (min) 37 min      Past Medical History  Diagnosis Date  . ALLERGIC RHINITIS 12/19/2009  . HYPOGONADISM, MALE 08/11/2007    f/u by Erik [name] urology    . OSTEOPOROSIS, IDIOPATHIC 08/11/2007    f/u at [facility] , now improved to osteopenia  . Raynaud's disease /phenomenon 08/16/2012  . Back pain chronic     On hydrocodone-Neurontin  . Blepharitis     B, chronic  . Chronic prostatitis      f/u by urology, has a prostate massage prn  . BPH (benign prostatic hyperplasia)   . Varicose veins   . Rosacea   . Shingles 09-2014  . 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 Groin injury     Past Surgical History  Procedure Laterality Date  . Cervical fusion  1994    C4-C5-C6 fusion  . Transurethral resection of prostate  07-2009  . Blepharoplasty      B blepharoplasty @ [facility] 08-2011    There were no vitals filed for this visit.  Visit Diagnosis:  Left shoulder pain  Shoulder weakness  Shoulder stiffness, left      Subjective Assessment - 06/19/15 0721    Subjective States shoulder is feeling okay so far this AM but states pain work him last night.  Pt is going out of town tomorrow and does not have additional appointments scheduled with PT but has f/u with MD on 06/26/15.   Currently in Pain? Yes   Pain Score --  3-4/10 AVG throughout the day, up to 5-6/10 at night   Pain Location Shoulder   Pain Orientation Left            [facility] PT Assessment - 06/19/15  0001    AROM   Left Shoulder Flexion 111 Degrees  stops due to c/o superior shoulder pain   Left Shoulder ABduction 85 Degrees  upper shoulder pain extending to lateral arm   PROM   Left Shoulder Flexion 145 Degrees  notes n/t in digits #4-5 after this   Left Shoulder ABduction 138 Degrees   Left Shoulder Internal Rotation 72 Degrees   Left Shoulder External Rotation 84 Degrees  painful clunk type sensation noted during this        TODAY'S TREATMENT: AROM and PROM assessment  Combo - [place]: 5cm, 0.5w/cm2, 1.0MHz, 20% with Premod E-stim (10-'20Hz'$ ) to lateral and posterior L shoulder x 12'  Iontophoresis patch #2/6 (4-6 hour) to L lateral shoulder with 63m.min dex (pt advised to remove by 2PM)            PT Short Term Goals - 06/07/15 0758    PT SHORT TERM GOAL #1   Title pt independent with initial HEP by 05/17/15   Status Achieved           PT Long Term Goals - 05/17/15 1051    PT LONG TERM GOAL #1   Title pt  independent with advanced HEP as necessary by 06/25/15   Status On-going   PT LONG TERM GOAL #2   Title R Shoulder AROM WFL all planes without c/o pain by 06/25/15   Status On-going   PT LONG TERM GOAL #3   Title R Shoulder MMT 4+/5 all planes without c/o pain to allow performance of all chores and recreation activities by 06/25/15   Status On-going   PT LONG TERM GOAL #4   Title pt able to sleep without limitation by shoulder pain by 06/25/15   Status On-going   PT LONG TERM GOAL #5   Title pt reports infrequent shoulder pain rating no greater than 3/10 by 06/25/15   Status On-going               Plan - 06/19/15 0800    Clinical Impression Statement [name] hasn't made much progress with PT to date.  His L Shoulder AROM still very restricted due to c/o pain, he has difficulty sleeping due to pain, and he states he tries to not use L UE throughout the day due to pain with L UE activity.  Today's AROM values no significant change since  beginning PT; however, there was some improvement in PROM ER and ABD.  A significant clunking sensation noted with ER PROM and pt noted n/t into L digits #4-5 following PROM.  Pt is going out of town and will not be seen by PT again until he is seen by MD.  Due to lack of progress and continued pain, pt likely to be sent for MRI.  Signs/symptoms consistent with RC tear vs labral tear (no provocation testing performed today in order to limit increased pain before pt goes out of town).  Pt on hold from PT pending MD appointment.   PT Next Visit Plan pt on hold pending MD appointment on 06/26/15   Consulted and Agree with Plan of Care Patient        Problem List Patient Active Problem List   Diagnosis Date Noted  . PCP NOTES >>> 04/29/2015  . Rib pain on right side 11/08/2014  . Binocular vision disorder with diplopia 10/01/2014  . Open angle with borderline findings, high risk 10/01/2014  . 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 Herpes zoster-- 09-2014 09/05/2014  . Anal fissure 02/28/2014  . Chronic prostatitis 02/28/2014  . Abnormal prostate specific antigen 02/28/2014  . Acid reflux 02/28/2014  . Ganglion of tendon 02/28/2014  . Incomplete bladder emptying 02/28/2014  . FOM (frequency of micturition) 02/28/2014  . Osteopenia 02/28/2014  . Sesamoiditis 02/28/2014  . Urgency of micturation 02/28/2014  . Baker's cyst of knee 04/26/2013  . 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 Speech abnormality 02/13/2013  . Raynaud phenomenon 08/16/2012  . Glaucoma suspect 04/19/2012  . Blepharitis 04/19/2012  . Annual physical exam 10/21/2011  . Neck -- back pain- UDS 09/18/2011  . ALLERGIC RHINITIS 12/19/2009  . PELVIC PAIN, CHRONIC 04/03/2009  . Hypogonadism -- per urology 08/11/2007  . OSTEOPOROSIS, IDIOPATHIC 08/11/2007  . HEADACHE 08/11/2007    [facility] 06/19/2015, 8:04 AM  [facility] [address]  [place], [place], [address] Phone: [phone]   Fax:  [phone]  Name: Erik [name] MRN: [id number] Date of Birth: 06-02-1956    PHYSICAL THERAPY DISCHARGE SUMMARY  Visits from Start of Care: 9  Current functional level related to goals / functional  outcomes: Continued pain and limited ROM    Remaining deficits: Pain and limited functional abilities    Education / Equipment: HEP  Plan: Patient agrees to discharge.  Patient goals were not met. Patient is being discharged due to not returning since the last visit.  ?????     Celyn P. Helene Kelp PT, MPH 09/04/2015 12:41 PM

## 2015-07-03 ENCOUNTER — Other Ambulatory Visit (HOSPITAL_COMMUNITY): Payer: Self-pay | Admitting: Sports Medicine

## 2015-07-03 DIAGNOSIS — M7502 Adhesive capsulitis of left shoulder: Secondary | ICD-10-CM

## 2015-07-05 ENCOUNTER — Telehealth: Payer: Self-pay

## 2015-07-05 NOTE — Telephone Encounter (Signed)
UDS: 06/18/2015  Negative for Hydrocodone: PRN   Low risk per Dr. Larose Kells 07/04/2015

## 2015-07-08 DIAGNOSIS — M7532 Calcific tendinitis of left shoulder: Secondary | ICD-10-CM | POA: Diagnosis not present

## 2015-07-08 DIAGNOSIS — I7300 Raynaud's syndrome without gangrene: Secondary | ICD-10-CM | POA: Diagnosis not present

## 2015-07-08 DIAGNOSIS — M545 Low back pain: Secondary | ICD-10-CM | POA: Diagnosis not present

## 2015-07-08 DIAGNOSIS — M5012 Mid-cervical disc disorder, unspecified level: Secondary | ICD-10-CM | POA: Diagnosis not present

## 2015-07-08 MED FILL — AMLODIPINE BESYLATE 5 MG TA: 5 | 90 days supply | Qty: 90 | Fill #0

## 2015-07-08 MED FILL — CYCLOBENZAPRINE 10 MG TAB: 10 | 30 days supply | Qty: 60 | Fill #0

## 2015-07-09 MED FILL — RISEDRONATE NA 150 MG TAB: 150 | 28 days supply | Qty: 1 | Fill #5

## 2015-07-09 MED FILL — AVENOVA LID & LASH SPRAY: 0.01 | 30 days supply | Qty: 40 | Fill #2

## 2015-07-16 ENCOUNTER — Ambulatory Visit (HOSPITAL_COMMUNITY)
Admission: RE | Admit: 2015-07-16 | Discharge: 2015-07-16 | Disposition: A | Discharge status: Home / Self Care | Payer: 59 | Source: Ambulatory Visit | Attending: Sports Medicine | Admitting: Sports Medicine

## 2015-07-16 DIAGNOSIS — M25512 Pain in left shoulder: Secondary | ICD-10-CM | POA: Diagnosis not present

## 2015-07-16 DIAGNOSIS — M7502 Adhesive capsulitis of left shoulder: Secondary | ICD-10-CM | POA: Insufficient documentation

## 2015-07-16 DIAGNOSIS — M7552 Bursitis of left shoulder: Secondary | ICD-10-CM | POA: Insufficient documentation

## 2015-07-16 DIAGNOSIS — M19012 Primary osteoarthritis, left shoulder: Secondary | ICD-10-CM | POA: Diagnosis not present

## 2015-07-25 MED FILL — CIALIS 5 MG TABLET: 5 | 30 days supply | Qty: 30 | Fill #3

## 2015-07-25 MED FILL — OMEPRAZOLE DR 40 MG CAPSULE: 40 | 30 days supply | Qty: 30 | Fill #2

## 2015-08-01 MED FILL — HYDROCODON-APAP 5-325: 5-325 | 8 days supply | Qty: 30 | Fill #0

## 2015-08-02 ENCOUNTER — Ambulatory Visit (INDEPENDENT_AMBULATORY_CARE_PROVIDER_SITE_OTHER): Payer: 59 | Admitting: Internal Medicine

## 2015-08-02 ENCOUNTER — Encounter: Payer: Self-pay | Admitting: Internal Medicine

## 2015-08-02 VITALS — BP 116/78 | HR 81 | Temp 97.8°F | Ht 73.0 in | Wt 199.4 lb

## 2015-08-02 DIAGNOSIS — R1030 Lower abdominal pain, unspecified: Secondary | ICD-10-CM

## 2015-08-02 DIAGNOSIS — R1032 Left lower quadrant pain: Secondary | ICD-10-CM

## 2015-08-02 DIAGNOSIS — M79651 Pain in right thigh: Secondary | ICD-10-CM

## 2015-08-02 NOTE — Patient Instructions (Signed)
Please see the orthopedic doctor @ [facility] orthopedics

## 2015-08-02 NOTE — Progress Notes (Signed)
Subjective:    Patient ID: Erik [name], male    DOB: 11/20/1955, 60 y.o.   MRN: [id number]  DOS:  08/02/2015 Type of visit - description : acute Interval history: Sudden onset of L groin-abd pain ~ June 2016, at work, while doing heavy lifting. The company doctor send him to ortho (Workers comp, Dr Laurance Flatten, [place]), dx w/ a sprain in the area, s/p PT: no help. Ortho ordered a MRI-- denied by WC. Here for further advise : The pain is steady, worse by lifting or increasinbg his physical activity. No mass at the area. Recently saw his urologist, he felt no hernia but stated a CT may be needed to r/o that completely .  Additionally, 6 weeks ago noted a lump at the lateral side of the right leg   Review of Systems No fever or chills No nausea, vomiting, diarrhea or blood in the stools. Back pain: On and off problem, at baseline.   Past Medical History  Diagnosis Date  . ALLERGIC RHINITIS 12/19/2009  . HYPOGONADISM, MALE 08/11/2007    f/u by Dr Thomasene Mohair urology    . OSTEOPOROSIS, IDIOPATHIC 08/11/2007    f/u at [facility] , now improved to osteopenia  . Raynaud's disease /phenomenon 08/16/2012  . Back pain chronic     On hydrocodone-Neurontin  . Blepharitis     B, chronic  . Chronic prostatitis      f/u by urology, has a prostate massage prn  . BPH (benign prostatic hyperplasia)   . Varicose veins   . Rosacea   . Shingles 09-2014  . 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 Groin injury     Past Surgical History  Procedure Laterality Date  . Cervical fusion  1994    C4-C5-C6 fusion  . Transurethral resection of prostate  07-2009  . Blepharoplasty      B blepharoplasty @ [facility] 08-2011    Social History   Social History  . Marital Status: Married    Spouse Name: N/A  . Number of Children: 3  . Years of Education: N/A   Occupational History  . Media planner for [employer]      Social History Main Topics  . Smoking status: Never Smoker   . Smokeless tobacco:  Never Used  . Alcohol Use: No  . Drug Use: No  . Sexual Activity: Not on file   Other Topics Concern  . Not on file   Social History Narrative   Lives with wife.  They have three children.   Daughter is an Ship broker          Medication List       This list is accurate as of: 08/02/15 11:59 PM.  Always use your most recent med list.               amLODipine 5 MG tablet  Commonly known as:  NORVASC  Take 5 mg by mouth daily. Prn Reynaud's     cyclobenzaprine 10 MG tablet  Commonly known as:  FLEXERIL  Take 1 tablet (10 mg total) by mouth at bedtime as needed for muscle spasms.     HYDROcodone-acetaminophen 5-325 MG tablet  Commonly known as:  NORCO  Take 1 tablet by mouth every 6 (six) hours as needed for moderate pain.     HYDROcodone-acetaminophen 5-325 MG tablet  Commonly known as:  NORCO  Take 1 tablet by mouth every 6 (six) hours as needed for moderate pain.     loratadine  10 MG tablet  Commonly known as:  CLARITIN  Take 10 mg by mouth daily.     naproxen 250 MG tablet  Commonly known as:  NAPROSYN  Take by mouth 2 (two) times daily with a meal. Reported on 08/02/2015     omeprazole 40 MG capsule  Commonly known as:  PRILOSEC  Take 1 capsule (40 mg total) by mouth daily.     risedronate 150 MG tablet  Commonly known as:  ACTONEL  Take 150 mg by mouth every 30 (thirty) days.     tadalafil 5 MG tablet  Commonly known as:  CIALIS  Take 5 mg by mouth daily.     testosterone cypionate 100 MG/ML injection  Commonly known as:  DEPOTESTOTERONE CYPIONATE  Inject into the muscle every 14 (fourteen) days. For IM use only     traMADol 50 MG tablet  Commonly known as:  ULTRAM  Take by mouth every 6 (six) hours as needed. Reported on 08/02/2015           Objective:   Physical Exam  Musculoskeletal:       Legs:  BP 116/78 mmHg  Pulse 81  Temp(Src) 97.8 F (36.6 C) (Oral)  Ht 6\' 1"  (1.854 m)  Wt 199 lb 6 oz (90.436 kg)  BMI 26.31 kg/m2  SpO2  97% General:   Well developed, well nourished . NAD.  HEENT:  Normocephalic . Face symmetric, atraumatic Abdomen:  Not distended, soft, non-tender. No rebound or rigidity. No mass,organomegaly. Inguinal areas: No mass or hernia on exam. GU: Scrotal contents normal MSK: Right hip rotation normal, left hip rotation normal but did elicit some pain. Neurologic:  alert & oriented X3.  Speech normal, gait appropriate for age and unassisted Psych--  Cognition and judgment appear intact.  Cooperative with normal attention span and concentration.  Behavior appropriate. No anxious or depressed appearing.    Assessment & Plan:   Assessment > Osteoporosis, idiopathic, follow-up at [facility]. Raynaud phenomena 2013--Dr Truslow, amlodipine prn MSK -- s/p Cervical fusion --Chronic back pain: Hydrocodone rx by pcp  (used to be on gabapentin ) GU: Dr. Thomasene Mohair, urology --BPH, TURP 2011 --Chronic prostatitis --Hypogonadism, per urology Rosacea Glaucoma  Chronic blepharitis Shingles 09-2014 H/o Raynaud phenomena 2013 : Amlodipine when necessary   PLAN Left groin pain: Ongoing for 6 months, started after her heavy lifting. DDX abdominal wall injury, hip injury, occult  hernia.  Plan: Recommend to see his regular orthopedic surgeon, he has an appointment next week, I agreed that he'll likely need a MRI of the hip and soft tissue of the groin. If the pain continue and no diagnosis is made I advised him to call me. Refer to surgery? CT to rule out the hernia?. Lump at the right leg: Seems attached to deeper structures, pt concerned, will refer to sports medicine, hopefully they can do a soft tissue ultrasound and  determine more precisely the nature of the lump. RTC 06-2016 cpx

## 2015-08-02 NOTE — Progress Notes (Signed)
Pre visit review using our clinic review tool, if applicable. No additional management support is needed unless otherwise documented below in the visit note. 

## 2015-08-05 ENCOUNTER — Encounter: Payer: Self-pay | Admitting: Family Medicine

## 2015-08-05 ENCOUNTER — Ambulatory Visit (INDEPENDENT_AMBULATORY_CARE_PROVIDER_SITE_OTHER): Payer: 59 | Admitting: Family Medicine

## 2015-08-05 VITALS — BP 114/78 | HR 88 | Ht 72.0 in | Wt 195.0 lb

## 2015-08-05 DIAGNOSIS — M79604 Pain in right leg: Secondary | ICD-10-CM | POA: Diagnosis not present

## 2015-08-05 NOTE — Patient Instructions (Signed)
You have a small fibroma in your IT band. This is not dangerous though sounds like it's irritating a nerve locally. Do stretches as directed - pick 2-3, hold for 20-30 seconds x 3 daily for next 6 weeks. Side hip raises, standing hip rotations 3 sets of 10 once a day. Can consider compression sleeve though this is unlikely to help. Cortisone injection locally, a nerve blocking medication are options long term if this is still an issue. Follow up with me as needed if you want to pursue one of the above if this isn't improving.

## 2015-08-07 DIAGNOSIS — M79604 Pain in right leg: Secondary | ICD-10-CM | POA: Insufficient documentation

## 2015-08-07 NOTE — Progress Notes (Signed)
PCP and consultation requested by: Kathlene November, MD  Subjective:   HPI: Patient is a 60 y.o. male here for thigh pain.  Patient reports about 2 months ago he noticed a small lump lateral thigh. Associated burning sensation and this swells at times with increased activity. Pain level 0/10 currently though. Gets some itching, feels warm. No skin changes, fever, other complaints.  Past Medical History  Diagnosis Date  . ALLERGIC RHINITIS 12/19/2009  . HYPOGONADISM, MALE 08/11/2007    f/u by Dr Thomasene Mohair urology    . OSTEOPOROSIS, IDIOPATHIC 08/11/2007    f/u at [facility] , now improved to osteopenia  . Raynaud's disease /phenomenon 08/16/2012  . Back pain chronic     On hydrocodone-Neurontin  . Blepharitis     B, chronic  . Chronic prostatitis      f/u by urology, has a prostate massage prn  . BPH (benign prostatic hyperplasia)   . Varicose veins   . Rosacea   . Shingles 09-2014  . 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 Groin injury     Current Outpatient Prescriptions on File Prior to Visit  Medication Sig Dispense Refill  . amLODipine (NORVASC) 5 MG tablet Take 5 mg by mouth daily. Prn Reynaud's    . cyclobenzaprine (FLEXERIL) 10 MG tablet Take 1 tablet (10 mg total) by mouth at bedtime as needed for muscle spasms. 30 tablet 0  . HYDROcodone-acetaminophen (NORCO) 5-325 MG tablet Take 1 tablet by mouth every 6 (six) hours as needed for moderate pain. 30 tablet 0  . HYDROcodone-acetaminophen (NORCO) 5-325 MG tablet Take 1 tablet by mouth every 6 (six) hours as needed for moderate pain. 30 tablet 0  . loratadine (CLARITIN) 10 MG tablet Take 10 mg by mouth daily.    . naproxen (NAPROSYN) 250 MG tablet Take by mouth 2 (two) times daily with a meal. Reported on 08/02/2015    . omeprazole (PRILOSEC) 40 MG capsule Take 1 capsule (40 mg total) by mouth daily. 30 capsule 3  . risedronate (ACTONEL) 150 MG tablet Take 150 mg by mouth every 30 (thirty) days.     . tadalafil (CIALIS) 5 MG tablet  Take 5 mg by mouth daily.     Marland Kitchen testosterone cypionate (DEPOTESTOTERONE CYPIONATE) 100 MG/ML injection Inject into the muscle every 14 (fourteen) days. For IM use only    . traMADol (ULTRAM) 50 MG tablet Take by mouth every 6 (six) hours as needed. Reported on 08/02/2015     No current facility-administered medications on file prior to visit.    Past Surgical History  Procedure Laterality Date  . Cervical fusion  1994    C4-C5-C6 fusion  . Transurethral resection of prostate  07-2009  . Blepharoplasty      B blepharoplasty @ [facility] 08-2011    Allergies  Allergen Reactions  . Sulfa Drugs Cross Reactors Nausea And Vomiting  . Iodinated Diagnostic Agents Rash    Social History   Social History  . Marital Status: Married    Spouse Name: N/A  . Number of Children: 3  . Years of Education: N/A   Occupational History  . Media planner for [employer]      Social History Main Topics  . Smoking status: Never Smoker   . Smokeless tobacco: Never Used  . Alcohol Use: No  . Drug Use: No  . Sexual Activity: Not on file   Other Topics Concern  . Not on file   Social History Narrative   Lives  with wife.  They have three children.   Daughter is an Ship broker      Family History  Problem Relation Age of Onset  . COPD Father     Died, 71  . Hypertension Father   . Stroke Father   . Diabetes Mother     M, GP  . Hypertension Mother   . CAD Mother     F, M, GP  . Colon polyps Mother   . Cancer Mother     Ovarian cancer  . Hypertension Sister   . Fibromyalgia Sister   . Hyperlipidemia Sister   . Prostate cancer Neg Hx   . Colon cancer Maternal Aunt     BP 114/78 mmHg  Pulse 88  Ht 6' (1.829 m)  Wt 195 lb (88.451 kg)  BMI 26.44 kg/m2  Review of Systems: See HPI above.    Objective:  Physical Exam:  Gen: NAD, comfortable in exam room.  Right leg: Small palpable mass very superficial distal lateral thigh.  No swelling, bruising, other abnormalities. No  tenderness to palpation. FROM knee and hip without pain.   Tight with obers test. Strength 5-/5 with hip abduction. NVI distally.  MSK u/s:  Small fibroma about 1 mm in depth in area in question.  No cystic component.  No other abnormalities.    Assessment & Plan:  1. 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  Shown home exercises and stretches for IT band.  Consider injection, nerve blocking medication if this progresses (describes some local nerve irritation with this).  Otherwise f/u prn.

## 2015-08-07 NOTE — Assessment & Plan Note (Signed)
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  Shown home exercises and stretches for IT band.  Consider injection, nerve blocking medication if this progresses (describes some local nerve irritation with this).  Otherwise f/u prn.

## 2015-08-08 ENCOUNTER — Encounter: Payer: Self-pay | Admitting: Primary Care

## 2015-08-09 ENCOUNTER — Encounter: Payer: Self-pay | Admitting: Medical

## 2015-08-09 ENCOUNTER — Ambulatory Visit (INDEPENDENT_AMBULATORY_CARE_PROVIDER_SITE_OTHER): Payer: 59 | Admitting: Medical

## 2015-08-09 VITALS — BP 120/80 | HR 75 | Temp 98.1°F | Ht 72.0 in | Wt 199.0 lb

## 2015-08-09 DIAGNOSIS — J0100 Acute maxillary sinusitis, unspecified: Secondary | ICD-10-CM | POA: Diagnosis not present

## 2015-08-09 DIAGNOSIS — R059 Cough, unspecified: Secondary | ICD-10-CM

## 2015-08-09 DIAGNOSIS — R05 Cough: Secondary | ICD-10-CM

## 2015-08-09 DIAGNOSIS — J209 Acute bronchitis, unspecified: Secondary | ICD-10-CM

## 2015-08-09 MED ORDER — BENZONATATE 100 MG PO CAPS: 100.0000 mg | ORAL_CAPSULE | Freq: Three times a day (TID) | ORAL | Status: DC | PRN

## 2015-08-09 MED ORDER — AZITHROMYCIN 250 MG PO TABS: ORAL_TABLET | ORAL | Status: DC

## 2015-08-09 MED ORDER — AZELASTINE HCL 0.1 % NA SOLN: NASAL | Status: DC

## 2015-08-09 MED FILL — AZELASTINE 0.1% (137 MCG) S: 0.1 | 30 days supply | Qty: 30 | Fill #0

## 2015-08-09 MED FILL — AZITHROMYCIN 250 MG TABLET: 250 | 5 days supply | Qty: 6 | Fill #0

## 2015-08-09 MED FILL — RISEDRONATE NA 150 MG TAB: 150 | 28 days supply | Qty: 1 | Fill #6

## 2015-08-09 MED FILL — BENZONATATE 100 MG CAPSULE: 100 | 7 days supply | Qty: 21 | Fill #0

## 2015-08-09 NOTE — Patient Instructions (Addendum)
You appear to have a sinus infection. I am prescribing  azithromycin antibiotic for the infection. To help with the nasal congestion continue flonase in am and I will add astelin at night. For your associated cough, I prescribed cough medicine benzonatate.  Possible early bronchitis as well.  Rest, hydrate, tylenol for fever.  Follow up in 7 days or as needed.

## 2015-08-09 NOTE — Progress Notes (Signed)
Pre visit review using our clinic review tool, if applicable. No additional management support is needed unless otherwise documented below in the visit note. 

## 2015-08-09 NOTE — Progress Notes (Signed)
Subjective:    Patient ID: Erik [name], male    DOB: 1956-05-02, 60 y.o.   MRN: [id number]  HPI  Pt in with some full sinus pressure over last week/about 10 days. Pt has used flonase and claritin. Pt is getting is getting more cough. Pt thinks may become productive.  Not much sneezing that he remembers.   Occasional sinus infections. Ears are plugged up some.     Review of Systems  Constitutional: Negative for fever, chills and fatigue.  HENT: Positive for congestion, ear pain, postnasal drip and sinus pressure. Negative for sneezing.   Respiratory: Positive for cough. Negative for choking, chest tightness, shortness of breath and wheezing.   Cardiovascular: Negative for chest pain and palpitations.  Gastrointestinal: Positive for abdominal pain.  Musculoskeletal: Negative for back pain.  Neurological: Negative for dizziness and headaches.  Hematological: Negative for adenopathy. Does not bruise/bleed easily.  Psychiatric/Behavioral: Negative for behavioral problems and confusion.    Past Medical History  Diagnosis Date  . ALLERGIC RHINITIS 12/19/2009  . HYPOGONADISM, MALE 08/11/2007    f/u by Dr Thomasene Mohair urology    . OSTEOPOROSIS, IDIOPATHIC 08/11/2007    f/u at [facility] , now improved to osteopenia  . Raynaud's disease /phenomenon 08/16/2012  . Back pain chronic     On hydrocodone-Neurontin  . Blepharitis     B, chronic  . Chronic prostatitis      f/u by urology, has a prostate massage prn  . BPH (benign prostatic hyperplasia)   . Varicose veins   . Rosacea   . Shingles 09-2014  . 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 Groin injury     Social History   Social History  . Marital Status: Married    Spouse Name: N/A  . Number of Children: 3  . Years of Education: N/A   Occupational History  . Media planner for [employer]      Social History Main Topics  . Smoking status: Never Smoker   . Smokeless tobacco: Never Used  . Alcohol Use: No  .  Drug Use: No  . Sexual Activity: Not on file   Other Topics Concern  . Not on file   Social History Narrative   Lives with wife.  They have three children.   Daughter is an Ship broker      Past Surgical History  Procedure Laterality Date  . Cervical fusion  1994    C4-C5-C6 fusion  . Transurethral resection of prostate  07-2009  . Blepharoplasty      B blepharoplasty @ [facility] 08-2011    Family History  Problem Relation Age of Onset  . COPD Father     Died, 71  . Hypertension Father   . Stroke Father   . Diabetes Mother     M, GP  . Hypertension Mother   . CAD Mother     F, M, GP  . Colon polyps Mother   . Cancer Mother     Ovarian cancer  . Hypertension Sister   . Fibromyalgia Sister   . Hyperlipidemia Sister   . Prostate cancer Neg Hx   . Colon cancer Maternal Aunt     Allergies  Allergen Reactions  . Sulfa Drugs Cross Reactors Nausea And Vomiting  . Iodinated Diagnostic Agents Rash    Current Outpatient Prescriptions on File Prior to Visit  Medication Sig Dispense Refill  . amLODipine (NORVASC) 5 MG tablet Take 5 mg by mouth daily. Prn Reynaud's    .  cyclobenzaprine (FLEXERIL) 10 MG tablet Take 1 tablet (10 mg total) by mouth at bedtime as needed for muscle spasms. 30 tablet 0  . HYDROcodone-acetaminophen (NORCO) 5-325 MG tablet Take 1 tablet by mouth every 6 (six) hours as needed for moderate pain. 30 tablet 0  . loratadine (CLARITIN) 10 MG tablet Take 10 mg by mouth daily.    . naproxen (NAPROSYN) 250 MG tablet Take by mouth 2 (two) times daily with a meal. Reported on 08/02/2015    . omeprazole (PRILOSEC) 40 MG capsule Take 1 capsule (40 mg total) by mouth daily. 30 capsule 3  . risedronate (ACTONEL) 150 MG tablet Take 150 mg by mouth every 30 (thirty) days.     . tadalafil (CIALIS) 5 MG tablet Take 5 mg by mouth daily.     Marland Kitchen testosterone cypionate (DEPOTESTOTERONE CYPIONATE) 100 MG/ML injection Inject into the muscle every 14 (fourteen) days. For IM use only     . traMADol (ULTRAM) 50 MG tablet Take by mouth every 6 (six) hours as needed. Reported on 08/02/2015     No current facility-administered medications on file prior to visit.    BP 120/80 mmHg  Pulse 75  Temp(Src) 98.1 F (36.7 C) (Oral)  Ht 6' (1.829 m)  Wt 199 lb (90.266 kg)  BMI 26.98 kg/m2  SpO2 98%       Objective:   Physical Exam   General  Mental Status - Alert. General Appearance - Well groomed. Not in acute distress.  Skin Rashes- No Rashes.  HEENT Head- Normal. Ear Auditory Canal - Left- Normal. Right - Normal.Tympanic Membrane- Left- Normal. Right- Normal. Eye Sclera/Conjunctiva- Left- Normal. Right- Normal. Nose & Sinuses Nasal Mucosa- Left-  Boggy and Congested. Right-  Boggy and  Congested.Bilateral maxillary and frontal sinus pressure. Mouth & Throat Lips: Upper Lip- Normal: no dryness, cracking, pallor, cyanosis, or vesicular eruption. Lower Lip-Normal: no dryness, cracking, pallor, cyanosis or vesicular eruption. Buccal Mucosa- Bilateral- No Aphthous ulcers. Oropharynx- No Discharge or Erythema. Tonsils: Characteristics- Bilateral- No Erythema or Congestion. Size/Enlargement- Bilateral- No enlargement. Discharge- bilateral-None.  Neck Neck- Supple. No Masses.   Chest and Lung Exam Auscultation: Breath Sounds:-Clear even and unlabored.  Cardiovascular Auscultation:Rythm- Regular, rate and rhythm. Murmurs & Other Heart Sounds:Ausculatation of the heart reveal- No Murmurs.  Lymphatic Head & Neck General Head & Neck Lymphatics: Bilateral: Description- No Localized lymphadenopathy.      Assessment & Plan:  You appear to have a sinus infection. I am prescribing  azithromycin antibiotic for the infection. To help with the nasal congestion continue flonase in am and I will add astelin at night. For your associated cough, I prescribed cough medicine benzonatate.  Possible early bronchitis as well.  Rest, hydrate, tylenol for fever.  Follow  up in 7 days or as needed.

## 2015-08-12 DIAGNOSIS — M7502 Adhesive capsulitis of left shoulder: Secondary | ICD-10-CM | POA: Diagnosis not present

## 2015-08-12 DIAGNOSIS — S4992XD Unspecified injury of left shoulder and upper arm, subsequent encounter: Secondary | ICD-10-CM | POA: Diagnosis not present

## 2015-08-23 ENCOUNTER — Encounter: Payer: Self-pay | Admitting: Internal Medicine

## 2015-08-26 DIAGNOSIS — M67472 Ganglion, left ankle and foot: Secondary | ICD-10-CM | POA: Diagnosis not present

## 2015-08-27 MED FILL — CIALIS 5 MG TABLET: 5 | 30 days supply | Qty: 30 | Fill #4

## 2015-08-27 MED FILL — CYCLOBENZAPRINE 10 MG TAB: 10 | 30 days supply | Qty: 60 | Fill #1

## 2015-09-03 ENCOUNTER — Encounter: Payer: Self-pay | Admitting: Gastroenterology

## 2015-09-03 ENCOUNTER — Other Ambulatory Visit (HOSPITAL_COMMUNITY): Payer: Self-pay | Admitting: Diagnostic Radiology

## 2015-09-03 DIAGNOSIS — I83813 Varicose veins of bilateral lower extremities with pain: Secondary | ICD-10-CM

## 2015-09-03 MED FILL — RISEDRONATE NA 150 MG TAB: 150 | 28 days supply | Qty: 1 | Fill #7

## 2015-09-05 MED FILL — OMEPRAZOLE DR 40 MG CAPSULE: 40 | 30 days supply | Qty: 30 | Fill #3

## 2015-09-05 MED FILL — TESTOSTERONE CYP 200 MG/ML: 200 | 90 days supply | Qty: 7 | Fill #0

## 2015-09-24 ENCOUNTER — Ambulatory Visit
Admission: RE | Admit: 2015-09-24 | Discharge: 2015-09-24 | Disposition: A | Discharge status: Home / Self Care | Payer: 59 | Source: Ambulatory Visit | Attending: Diagnostic Radiology | Admitting: Diagnostic Radiology

## 2015-09-24 DIAGNOSIS — I872 Venous insufficiency (chronic) (peripheral): Secondary | ICD-10-CM | POA: Diagnosis not present

## 2015-09-24 DIAGNOSIS — I83813 Varicose veins of bilateral lower extremities with pain: Secondary | ICD-10-CM

## 2015-09-24 NOTE — Progress Notes (Signed)
Patient ID: Erik [name], male   DOB: 02-22-1956, 60 y.o.   MRN: [id number]       Chief Complaint: Patient was seen in consultation today for  Chief Complaint  Patient presents with  . Advice Only    Varicose Veins     at the request of Latia Mataya   History of Present Illness: Erik [name] is a 60 y.o. male whom I have treated for bilateral lower extremity venous insufficiency in the past.  The left great saphenous vein was treated with endovascular laser therapy in 2013 followed by bilateral lower extremity varicosity treatment with ultrasound-guided foam sclerotherapy. The patient has done for a few years but the symptoms have progressively returned. The patient now presents with worsening symptoms in both lower extremities. Patient is wearing the thigh-high stockings on a regular basis and gets minimal relief from them. His main complaint is constant sharp pain in the medial left calf and medial distal left thigh. There is itching, burning and aching associated with the left lower extremity discomfort. Patient also has pain, burning and itching in the right calf region. Patient also complains of left ankle swelling and sensitivity in the left ankle region. Patient is using Tylenol and naproxen twice a day for symptom control. Patient denies any skin breakdown, bleeding or ulcerations. Patient used to be a regular runner. Patient is no longer able to run on a regular basis due to pain in his right great toe area. He has had steroid injections and acupuncture in this area.   Past Medical History  Diagnosis Date  . ALLERGIC RHINITIS 12/19/2009  . HYPOGONADISM, MALE 08/11/2007    f/u by Dr Thomasene Mohair urology    . OSTEOPOROSIS, IDIOPATHIC 08/11/2007    f/u at [facility] , now improved to osteopenia  . Raynaud's disease /phenomenon 08/16/2012  . Back pain chronic     On hydrocodone-Neurontin  . Blepharitis     B, chronic  . Chronic prostatitis      f/u by urology, has a prostate massage prn  .  BPH (benign prostatic hyperplasia)   . Varicose veins   . Rosacea   . Shingles 09-2014  . 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 Groin injury     Past Surgical History  Procedure Laterality Date  . Cervical fusion  1994    C4-C5-C6 fusion  . Transurethral resection of prostate  07-2009  . Blepharoplasty      B blepharoplasty @ [facility] 08-2011    Allergies: Sulfa drugs cross reactors  Medications: Prior to Admission medications   Medication Sig Start Date End Date Taking? Authorizing Provider  amLODipine (NORVASC) 5 MG tablet Take 5 mg by mouth daily. Prn Reynaud's   Yes Historical Provider, MD  azelastine (ASTELIN) 0.1 % nasal spray 2 sprays each nares q day 08/09/15  Yes Edward Saguier, PA-C  cyclobenzaprine (FLEXERIL) 10 MG tablet Take 1 tablet (10 mg total) by mouth at bedtime as needed for muscle spasms. 05/17/14  Yes Colon Branch, MD  HYDROcodone-acetaminophen (NORCO) 5-325 MG tablet Take 1 tablet by mouth every 6 (six) hours as needed for moderate pain. 06/18/15  Yes Colon Branch, MD  loratadine (CLARITIN) 10 MG tablet Take 10 mg by mouth daily.   Yes Historical Provider, MD  naproxen (NAPROSYN) 250 MG tablet Take by mouth 2 (two) times daily with a meal. Reported on 08/02/2015   Yes Historical Provider, MD  omeprazole (PRILOSEC) 40 MG capsule Take 1 capsule (40 mg total) by  mouth daily. 04/29/15  Yes Colon Branch, MD  risedronate (ACTONEL) 150 MG tablet Take 150 mg by mouth every 30 (thirty) days.  01/23/14  Yes Historical Provider, MD  tadalafil (CIALIS) 5 MG tablet Take 5 mg by mouth daily.    Yes Historical Provider, MD  testosterone cypionate (DEPOTESTOTERONE CYPIONATE) 100 MG/ML injection Inject into the muscle every 14 (fourteen) days. For IM use only   Yes Historical Provider, MD  azithromycin (ZITHROMAX) 250 MG tablet Take 2 tablets by mouth on day 1, followed by 1 tablet by mouth daily for 4 days. Patient not taking: Reported on 09/24/2015 08/09/15   Mackie Pai, PA-C    benzonatate (TESSALON) 100 MG capsule Take 1 capsule (100 mg total) by mouth 3 (three) times daily as needed. Patient not taking: Reported on 09/24/2015 08/09/15   Mackie Pai, PA-C  traMADol (ULTRAM) 50 MG tablet Take by mouth every 6 (six) hours as needed. Reported on 09/24/2015    Historical Provider, MD     Family History  Problem Relation Age of Onset  . COPD Father     Died, 71  . Hypertension Father   . Stroke Father   . Diabetes Mother     M, GP  . Hypertension Mother   . CAD Mother     F, M, GP  . Colon polyps Mother   . Cancer Mother     Ovarian cancer  . Hypertension Sister   . Fibromyalgia Sister   . Hyperlipidemia Sister   . Prostate cancer Neg Hx   . Colon cancer Maternal Aunt     Social History   Social History  . Marital Status: Married    Spouse Name: N/A  . Number of Children: 3  . Years of Education: N/A   Occupational History  . Media planner for [employer]      Social History Main Topics  . Smoking status: Never Smoker   . Smokeless tobacco: Never Used  . Alcohol Use: No  . Drug Use: No  . Sexual Activity: Not on file   Other Topics Concern  . Not on file   Social History Narrative   Lives with wife.  They have three children.   Daughter is an Ship broker      Review of Systems  Constitutional: Negative for activity change.       Patient no longer runs for exercise due to right foot problems.  Musculoskeletal:       Right foot and great toe pain.     Vital Signs: BP 118/70 mmHg  Pulse 83  Temp(Src) 98 F (36.7 C) (Oral)  Resp 15  Ht 6' (1.829 m)  Wt 194 lb (87.998 kg)  BMI 26.31 kg/m2  SpO2 99%  Physical Exam  Constitutional: He appears well-developed and well-nourished.  Cardiovascular: Intact distal pulses.   Musculoskeletal:  Mild swelling in left ankle without discoloration.  Palpable varicose veins in medial left calf and left popliteal region.  Minimal swelling in right ankle.         Imaging: US  Venous Img Lower Bilateral  09/24/2015  CLINICAL DATA:  60 year old male with history of bilateral lower extremity superficially venous insufficiency. Patient has been treated in the past with endovascular laser therapy to the left great saphenous vein and ultrasound-guided foam sclerotherapy. The patient did very well for a few years but now has increased symptoms in the lower extremities, left side greater than right. EXAM: BILATERAL LOWER EXTREMITY VENOUS DUPLEX ULTRASOUND TECHNIQUE: Gray-scale  sonography with graded compression, as well as color Doppler and duplex ultrasound, were performed to evaluate the deep and superficial veins of both lower extremities. Spectral Doppler was utilized to evaluate flow at rest and with distal augmentation maneuvers. A complete superficial venous insufficiency exam was performed in the upright standing position. I personally performed the technical portion of the exam. COMPARISON:  02/13/2014 and 01/27/2012 FINDINGS: Right lower extremity: Deep system: Normal compressibility, augmentation and color Doppler flow in the right common femoral vein, right femoral vein and right popliteal vein. The right saphenofemoral junction is patent. Right profunda femoral vein is patent without thrombus. Superficial system: The proximal right great saphenous vein measures up to 7 mm. There is reflux in the mid and the distal thigh GSV. Reflux in the mid thigh GSV measures up to 1.8 seconds and reflux in the distal thigh measures up to 5 seconds. 5.4 seconds of reflux in the right GSV at the knee. Multiple varicosities associated the great saphenous vein along the proximal calf. There is extensive reflux in these varicosities measuring up to 5.5 seconds. Right great saphenous vein at the knee measures 4 mm. Prominent perforator branches associated with these varicosities in the upper calf and ankle. Patient has a ven of Giacomini. There is reflux in the mid right short saphenous vein measuring  up to 6 seconds. Mid short saphenous vein measures 6 mm in diameter. Left lower extremity: Deep system: Normal compressibility, augmentation and color Doppler flow in the left common femoral vein, left femoral vein and left popliteal vein. The left saphenofemoral junction is patent. Left profunda femoral vein is patent without thrombus. The deep system: The proximal left great saphenous vein is patent. A few cm from the origin, there is a branch point. The true great saphenous vein is occluded from the proximal thigh down to the proximal calf and this is related to the previous endovascular laser treatment. However, there is a large superficial vein that comes off the proximal GSV and extends down the medial aspect of the left thigh. This vessel measures up to 6 mm in diameter. There is 5.6 seconds of reflux within the proximal thigh, 5 seconds in the mid thigh and 5.3 seconds in the distal thigh. This large superficial vein is only 2 mm below the skin surface in the mid/distal region. At the knee, there is a large amount of reflux within this superficial vein measuring up to 5.3 seconds. Superficial vein connects with the patent mid calf great saphenous vein. There are multiple varicosities surrounding the patent left great saphenous vein in the calf. The distal calf great saphenous vein measures up to 7 mm and has approximately 5.5 seconds of reflux. There is a large perforator branch in the distal calf that has 5.2 seconds of reflux. This perforator branch measures up to 10 mm. No significant reflux identified in the left short saphenous vein. IMPRESSION: Positive for superficial venous insufficiency in both lower extremities. Large amount of reflux in the left leg associated with a superficial branch of the proximal thigh great saphenous vein. This superficial vein extends down the medial thigh and communicates with the great saphenous vein in the calf and multiple varicosities. In addition, there is evidence  for perforator disease in the left calf. Superficial venous reflux involving the right great saphenous vein and the right short saphenous vein. 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 Electronically Signed   By: Markus Daft M.D.   On: 09/24/2015 13:11    Labs:  CBC:  Recent Labs  06/06/15 0837  WBC 6.7  HGB 15.5  HCT 46.3  PLT 228.0    COAGS: No results for input(s): INR, APTT in the last 8760 hours.  BMP:  Recent Labs  06/06/15 0837  NA 140  K 3.7  CL 103  CO2 30  GLUCOSE 90  BUN 25*  CALCIUM 9.0  CREATININE 0.75    LIVER FUNCTION TESTS:  Recent Labs  06/06/15 0837  BILITOT 1.6*  AST 18  ALT 19  ALKPHOS 49  PROT 6.4  ALBUMIN 4.1    TUMOR MARKERS: No results for input(s): AFPTM, CEA, CA199, CHROMGRNA in the last 8760 hours.  Assessment and Plan:  60 year old with known bilateral lower extremity superficial venous insufficiency. The patient had been doing well after treatments to bilateral lower extremity varicose veins and the left great saphenous vein. Unfortunately, the patient's symptoms have returned. Ultrasound examination demonstrates significant reflux in a superficial vein associated with the proximal left great saphenous vein and the left calf great saphenous vein. Prominent varicosities in the left calf. In addition, there is evidence for perforator disease in the left lower extremity. Patient also has venous insufficiency in the right lower extremity associated with reflux in the right great saphenous vein and right short saphenous vein. There is also perforator disease in the right lower extremity.  CEAP in both legs is C2, Ep, Asp, Rp.    Patient would be a candidate for endovascular laser treatment to both lower extremities and ultrasound-guided foam sclerotherapy to both lower extremity varicose veins. The left leg symptoms are more severe than the right. The venous insufficiency in the large superficial vein along the medial left thigh  would be amenable to endovascular laser treatment. We may be able to treat the left calf great saphenous vein at the same time. Patient will also need ultrasound-guided foam therapy to treat the perforator disease in the left calf. I recommend treating the left leg first and then treating the right leg in 1-2 months. Treatment of the right leg will consist of endovascular laser therapy to the right great saphenous vein and right short saphenous vein and ultrasound-guided foam sclerotherapy to treat perforator disease and varicosities in the right lower extremity.   Electronically Signed: Carylon Perches 09/24/2015, 5:31 PM   I spent a total of   15 Minutes in face to face in clinical consultation, greater than 50% of which was counseling/coordinating care for varicose veins.

## 2015-09-25 MED FILL — CIALIS 5 MG TABLET: 5 | 30 days supply | Qty: 30 | Fill #5

## 2015-09-25 MED FILL — AZELASTINE 0.1% (137 MCG) S: 0.1 | 30 days supply | Qty: 30 | Fill #1

## 2015-10-02 MED FILL — RISEDRONATE NA 150 MG TAB: 150 | 28 days supply | Qty: 1 | Fill #8

## 2015-10-07 ENCOUNTER — Other Ambulatory Visit (HOSPITAL_COMMUNITY): Payer: Self-pay | Admitting: Diagnostic Radiology

## 2015-10-07 DIAGNOSIS — I83813 Varicose veins of bilateral lower extremities with pain: Secondary | ICD-10-CM

## 2015-10-14 ENCOUNTER — Ambulatory Visit (AMBULATORY_SURGERY_CENTER): Payer: Self-pay

## 2015-10-14 ENCOUNTER — Other Ambulatory Visit (HOSPITAL_COMMUNITY): Payer: Self-pay | Admitting: Diagnostic Radiology

## 2015-10-14 VITALS — Ht 72.0 in | Wt 204.2 lb

## 2015-10-14 DIAGNOSIS — I83813 Varicose veins of bilateral lower extremities with pain: Secondary | ICD-10-CM

## 2015-10-14 DIAGNOSIS — Z860100 Personal history of colon polyps, unspecified: Secondary | ICD-10-CM

## 2015-10-14 NOTE — Progress Notes (Signed)
No allergies to eggs or soy No diet/weight loss meds No past problems with anesthesia No home oxgyen  Has email and internet; refused emmi

## 2015-10-15 DIAGNOSIS — N400 Enlarged prostate without lower urinary tract symptoms: Secondary | ICD-10-CM | POA: Diagnosis not present

## 2015-10-15 DIAGNOSIS — N411 Chronic prostatitis: Secondary | ICD-10-CM | POA: Diagnosis not present

## 2015-10-15 MED FILL — CIALIS 5 MG TABLET: 5 | 30 days supply | Qty: 30 | Fill #0

## 2015-10-22 ENCOUNTER — Other Ambulatory Visit (HOSPITAL_COMMUNITY): Payer: Self-pay | Admitting: Diagnostic Radiology

## 2015-10-22 DIAGNOSIS — I83813 Varicose veins of bilateral lower extremities with pain: Secondary | ICD-10-CM

## 2015-10-28 ENCOUNTER — Encounter: Payer: Self-pay | Admitting: Gastroenterology

## 2015-10-28 ENCOUNTER — Ambulatory Visit (AMBULATORY_SURGERY_CENTER): Payer: 59 | Admitting: Gastroenterology

## 2015-10-28 VITALS — BP 114/79 | HR 72 | Temp 98.0°F | Resp 6 | Ht 72.0 in | Wt 204.0 lb

## 2015-10-28 DIAGNOSIS — E669 Obesity, unspecified: Secondary | ICD-10-CM | POA: Diagnosis not present

## 2015-10-28 DIAGNOSIS — Z86010 Personal history of colonic polyps: Secondary | ICD-10-CM

## 2015-10-28 DIAGNOSIS — D122 Benign neoplasm of ascending colon: Secondary | ICD-10-CM | POA: Diagnosis not present

## 2015-10-28 DIAGNOSIS — I7300 Raynaud's syndrome without gangrene: Secondary | ICD-10-CM | POA: Diagnosis not present

## 2015-10-28 DIAGNOSIS — K219 Gastro-esophageal reflux disease without esophagitis: Secondary | ICD-10-CM | POA: Diagnosis not present

## 2015-10-28 DIAGNOSIS — I10 Essential (primary) hypertension: Secondary | ICD-10-CM | POA: Diagnosis not present

## 2015-10-28 MED ORDER — SODIUM CHLORIDE 0.9 % IV SOLN: 500.0000 mL | INTRAVENOUS | Status: DC

## 2015-10-28 NOTE — Patient Instructions (Signed)
Discharge instructions given. Handout on polyps. Resume previous medications. YOU HAD AN ENDOSCOPIC PROCEDURE TODAY AT THE [facility]:   Refer to the procedure report that was given to you for any specific questions about what was found during the examination.  If the procedure report does not answer your questions, please call your gastroenterologist to clarify.  If you requested that your care partner not be given the details of your procedure findings, then the procedure report has been included in a sealed envelope for you to review at your convenience later.  YOU SHOULD EXPECT: Some feelings of bloating in the abdomen. Passage of more gas than usual.  Walking can help get rid of the air that was put into your GI tract during the procedure and reduce the bloating. If you had a lower endoscopy (such as a colonoscopy or flexible sigmoidoscopy) you may notice spotting of blood in your stool or on the toilet paper. If you underwent a bowel prep for your procedure, you may not have a normal bowel movement for a few days.  Please Note:  You might notice some irritation and congestion in your nose or some drainage.  This is from the oxygen used during your procedure.  There is no need for concern and it should clear up in a day or so.  SYMPTOMS TO REPORT IMMEDIATELY:   Following lower endoscopy (colonoscopy or flexible sigmoidoscopy):  Excessive amounts of blood in the stool  Significant tenderness or worsening of abdominal pains  Swelling of the abdomen that is new, acute  Fever of 100F or higher   For urgent or emergent issues, a gastroenterologist can be reached at any hour by calling [phone].   DIET: Your first meal following the procedure should be a small meal and then it is ok to progress to your normal diet. Heavy or fried foods are harder to digest and may make you feel nauseous or bloated.  Likewise, meals heavy in dairy and vegetables can increase bloating.  Drink  plenty of fluids but you should avoid alcoholic beverages for 24 hours.  ACTIVITY:  You should plan to take it easy for the rest of today and you should NOT DRIVE or use heavy machinery until tomorrow (because of the sedation medicines used during the test).    FOLLOW UP: Our staff will call the number listed on your records the next business day following your procedure to check on you and address any questions or concerns that you may have regarding the information given to you following your procedure. If we do not reach you, we will leave a message.  However, if you are feeling well and you are not experiencing any problems, there is no need to return our call.  We will assume that you have returned to your regular daily activities without incident.  If any biopsies were taken you will be contacted by phone or by letter within the next 1-3 weeks.  Please call us at [phone] if you have not heard about the biopsies in 3 weeks.    SIGNATURES/CONFIDENTIALITY: You and/or your care partner have signed paperwork which will be entered into your electronic medical record.  These signatures attest to the fact that that the information above on your After Visit Summary has been reviewed and is understood.  Full responsibility of the confidentiality of this discharge information lies with you and/or your care-partner. 

## 2015-10-28 NOTE — Progress Notes (Signed)
Called to room to assist during endoscopic procedure.  Patient ID and intended procedure confirmed with present staff. Received instructions for my participation in the procedure from the performing physician.  

## 2015-10-28 NOTE — Progress Notes (Signed)
Report to PACU, RN, vss, BBS= Clear.  

## 2015-10-28 NOTE — Op Note (Signed)
[place] Patient Name: Ferd Musa Procedure Date: 10/28/2015 7:55 AM MRN: [id number] Endoscopist: Milus Banister , MD Age: 60 Date of Birth: 25-Aug-1955 Gender: Male Procedure:                Colonoscopy Indications:              High risk colon cancer surveillance: Personal                            history of colonic polyps ([facility]                            colonoscopies have found 3-4cm rectal SSA, also 1cm                            TA over past several years, last examination 2011) Medicines:                Monitored Anesthesia Care Procedure:                Pre-Anesthesia Assessment:                           - Prior to the procedure, a History and Physical                            was performed, and patient medications and                            allergies were reviewed. The patient's tolerance of                            previous anesthesia was also reviewed. The risks                            and benefits of the procedure and the sedation                            options and risks were discussed with the patient.                            All questions were answered, and informed consent                            was obtained. Prior Anticoagulants: The patient has                            taken no previous anticoagulant or antiplatelet                            agents. ASA Grade Assessment: II - A patient with                            mild systemic disease. After reviewing the risks  and benefits, the patient was deemed in                            satisfactory condition to undergo the procedure.                           After obtaining informed consent, the colonoscope                            was passed under direct vision. Throughout the                            procedure, the patient's blood pressure, pulse, and                            oxygen saturations were monitored continuously. The                        Model PCF-H190L [id number]) scope was introduced                            through the anus and advanced to the the cecum,                            identified by appendiceal orifice and ileocecal                            valve. The colonoscopy was performed without                            difficulty. The patient tolerated the procedure                            well. The quality of the bowel preparation was                            good. The ileocecal valve, appendiceal orifice, and                            rectum were photographed. Scope In: 8:01:13 AM Scope Out: 8:15:01 AM Scope Withdrawal Time: 0 hours 7 minutes 50 seconds  Total Procedure Duration: 0 hours 13 minutes 48 seconds  Findings:                 A 4 mm polyp was found in the ascending colon. The                            polyp was sessile. The polyp was removed with a                            cold snare. Resection and retrieval were complete.                           The exam was otherwise without abnormality on  direct and retroflexion views. Complications:            No immediate complications. Estimated blood loss:                            None. Estimated Blood Loss:     Estimated blood loss: none. Impression:               - One 4 mm polyp in the ascending colon, removed                            with a cold snare. Resected and retrieved.                           - The examination was otherwise normal on direct                            and retroflexion views. Recommendation:           - Patient has a contact number available for                            emergencies. The signs and symptoms of potential                            delayed complications were discussed with the                            patient. Return to normal activities tomorrow.                            Written discharge instructions were provided to the                             patient.                           - Resume previous diet.                           - Continue present medications.                           You will receive a letter within 2-3 weeks with the                            pathology results and my final recommendations.                           If the polyp(s) is proven to be 'pre-cancerous' on                            pathology, you will need repeat colonoscopy in 5                            years \\without  need for colon cancer screening by  any method prior to then (including stool testing). Milus Banister, MD 10/28/2015 8:18:36 AM This report has been signed electronically.

## 2015-10-29 ENCOUNTER — Telehealth: Payer: Self-pay | Admitting: *Deleted

## 2015-10-29 NOTE — Telephone Encounter (Signed)
  Follow up Call-  Call back number 10/28/2015  Post procedure Call Back phone  # [phone]  Permission to leave phone message Yes     Patient questions:  Do you have a fever, pain , or abdominal swelling? No. Pain Score  0 *  Have you tolerated food without any problems? Yes.    Have you been able to return to your normal activities? Yes.    Do you have any questions about your discharge instructions: Diet   No. Medications  No. Follow up visit  No.  Do you have questions or concerns about your Care? No.  Actions: * If pain score is 4 or above: No action needed, pain <4.

## 2015-11-01 ENCOUNTER — Other Ambulatory Visit: Payer: Self-pay | Admitting: Internal Medicine

## 2015-11-01 MED FILL — OMEPRAZOLE DR 40 MG CAPSULE: 40 | 30 days supply | Qty: 30 | Fill #0

## 2015-11-04 ENCOUNTER — Encounter: Payer: Self-pay | Admitting: Gastroenterology

## 2015-11-06 ENCOUNTER — Ambulatory Visit
Admission: RE | Admit: 2015-11-06 | Discharge: 2015-11-06 | Disposition: A | Discharge status: Home / Self Care | Payer: 59 | Source: Ambulatory Visit | Attending: Diagnostic Radiology | Admitting: Diagnostic Radiology

## 2015-11-06 VITALS — BP 131/88 | HR 90 | Temp 97.5°F | Resp 14

## 2015-11-06 DIAGNOSIS — I83813 Varicose veins of bilateral lower extremities with pain: Secondary | ICD-10-CM

## 2015-11-06 DIAGNOSIS — I83812 Varicose veins of left lower extremities with pain: Secondary | ICD-10-CM

## 2015-11-06 DIAGNOSIS — I82812 Embolism and thrombosis of superficial veins of left lower extremities: Secondary | ICD-10-CM | POA: Diagnosis not present

## 2015-11-06 MED FILL — RISEDRONATE NA 150 MG TAB: 150 | 28 days supply | Qty: 1 | Fill #9

## 2015-11-06 NOTE — Progress Notes (Signed)
[id number] Informed consent obtained.  Given verbal & written discharge instructions & states that he understands.  0755  22 gauge x 1" insyte catheter started IV Left antecubital (1st attempt) w/ Saline lock & 7" extension.  Flushed w/ 10 mL NSS without difficulty.  Site "unremarkable".    0830  Procedure started.  1055  Procedure completed.  1115  Wearing thigh high graduated compression garment (20-30 mm Hg) on Left leg.  Saline lock discontinued Left forearm, catheter intact.  Site unremarkable.    1130  Ambulated x 10 minutes without assistance.  Gait steady.   1140  Follow up appointment scheduled for 11/12/2015 at 9 am.  Friend available for transport home.  Discharged to home.    Terrell Ostrand Riki Rusk, RN 11/06/2015 11:42 AM

## 2015-11-12 ENCOUNTER — Ambulatory Visit
Admission: RE | Admit: 2015-11-12 | Discharge: 2015-11-12 | Disposition: A | Discharge status: Home / Self Care | Payer: 59 | Source: Ambulatory Visit | Attending: Diagnostic Radiology | Admitting: Diagnostic Radiology

## 2015-11-12 DIAGNOSIS — I83813 Varicose veins of bilateral lower extremities with pain: Secondary | ICD-10-CM | POA: Diagnosis not present

## 2015-11-12 DIAGNOSIS — I82812 Embolism and thrombosis of superficial veins of left lower extremities: Secondary | ICD-10-CM | POA: Diagnosis not present

## 2015-11-12 MED FILL — CYCLOBENZAPRINE 10 MG TAB: 10 | 30 days supply | Qty: 60 | Fill #2

## 2015-11-12 NOTE — Progress Notes (Signed)
Patient ID: Erik [name], male   DOB: Dec 14, 1955, 60 y.o.   MRN: [id number]       Chief Complaint: Patient was seen in consultation today for No chief complaint on file.  at the request of Erik [name]  Referring Physician(s): Erik [name]  History of Present Illness: Erik [name] is a 61 y.o. male who is 1 week status post laser occlusion of the left greater saphenous vein. He denies any fevers or discharge from the laser entry site. He has been wearing his stockings. He denies any increased pain and has been undergoing light activity.  Past Medical History  Diagnosis Date  . ALLERGIC RHINITIS 12/19/2009  . HYPOGONADISM, MALE 08/11/2007    f/u by Dr Thomasene Mohair urology    . OSTEOPOROSIS, IDIOPATHIC 08/11/2007    f/u at [facility] , now improved to osteopenia  . Raynaud's disease /phenomenon 08/16/2012  . Back pain chronic     On hydrocodone-Neurontin  . Blepharitis     B, chronic  . Chronic prostatitis      f/u by urology, has a prostate massage prn  . BPH (benign prostatic hyperplasia)   . Varicose veins   . Rosacea   . Shingles 09-2014  . 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 Groin injury     Past Surgical History  Procedure Laterality Date  . Cervical fusion  1994    C4-C5-C6 fusion  . Transurethral resection of prostate  07-2009  . Blepharoplasty      B blepharoplasty @ [facility] 08-2011    Allergies: Sulfa drugs cross reactors  Medications: Prior to Admission medications   Medication Sig Start Date End Date Taking? Authorizing Provider  amLODipine (NORVASC) 5 MG tablet Take 5 mg by mouth daily. Prn Reynaud's    Historical Provider, MD  azelastine (ASTELIN) 0.1 % nasal spray 2 sprays each nares q day Patient not taking: Reported on 11/06/2015 08/09/15   Mackie Pai, PA-C  cyclobenzaprine (FLEXERIL) 10 MG tablet Take 1 tablet (10 mg total) by mouth at bedtime as needed for muscle spasms. 05/17/14   Colon Branch, MD  loratadine (CLARITIN) 10 MG tablet Take 10 mg by mouth  daily.    Historical Provider, MD  naproxen (NAPROSYN) 250 MG tablet Take by mouth 2 (two) times daily with a meal. Reported on 08/02/2015    Historical Provider, MD  omeprazole (PRILOSEC) 40 MG capsule TAKE ONE CAPSULE BY MOUTH DAILY 11/01/15   Colon Branch, MD  risedronate (ACTONEL) 150 MG tablet Take 150 mg by mouth every 30 (thirty) days.  01/23/14   Historical Provider, MD  tadalafil (CIALIS) 5 MG tablet Take 5 mg by mouth daily.     Historical Provider, MD  testosterone cypionate (DEPOTESTOTERONE CYPIONATE) 100 MG/ML injection Inject into the muscle every 14 (fourteen) days. For IM use only    Historical Provider, MD     Family History  Problem Relation Age of Onset  . COPD Father     Died, 71  . Hypertension Father   . Stroke Father   . Diabetes Mother     M, GP  . Hypertension Mother   . CAD Mother     F, M, GP  . Colon polyps Mother   . Cancer Mother     Ovarian cancer  . Hypertension Sister   . Fibromyalgia Sister   . Hyperlipidemia Sister   . Prostate cancer Neg Hx   . Colon cancer Maternal Aunt     Social History  Social History  . Marital Status: Married    Spouse Name: N/A  . Number of Children: 3  . Years of Education: N/A   Occupational History  . Media planner for [employer]      Social History Main Topics  . Smoking status: Never Smoker   . Smokeless tobacco: Never Used  . Alcohol Use: No  . Drug Use: No  . Sexual Activity: Not on file   Other Topics Concern  . Not on file   Social History Narrative   Lives with wife.  They have three children.   Daughter is an Ship broker        Review of Systems: A 12 point ROS discussed and pertinent positives are indicated in the HPI above.  All other systems are negative.  Review of Systems  Vital Signs: There were no vitals taken for this visit.  Physical Exam  Constitutional: He is oriented to person, place, and time. He appears well-developed and well-nourished.  Pulmonary/Chest: Effort  normal.  Musculoskeletal:  Examination of the left lower extremity demonstrates a laser entry site in the medial distal calf. There is some bruising along the greater saphenous vein at the knee and in the upper thigh. There is no skin breakdown. The extremity is warm and pink with little if any edema.  Neurological: He is alert and oriented to person, place, and time.    Mallampati Score:     Imaging: US Venous Img Lower Unilateral Left  11/12/2015  CLINICAL DATA:  One week status post ablation of the left greater saphenous vein EXAM: LEFT LOWER EXTREMITY VENOUS DUPLEX ULTRASOUND TECHNIQUE: Doppler venous assessment of the left lower extremity deep venous system was performed, including characterization of spectral flow, compressibility, and phasicity. Examination of the superficial venous system was also performed. COMPARISON:  09/24/2015 FINDINGS: There is complete compressibility of the left common femoral, femoral, and popliteal veins. Doppler analysis demonstrates respiratory phasicity and augmentation of flow upon calf compression. The greater saphenous vein from the knee to the calf is thrombosed. Varicosities about the greater saphenous vein in the calf persist. Above the knee, the greater saphenous vein has a complex appearance. In the proximal thigh, it is very diminutive an the wall is markedly thickened. There is a tiny lumen that is compressible. The medial branch of the left greater saphenous vein beginning in the mid thigh and extending posteriorly and inferiorly is thrombosed and occluded. Below the mid thigh the greater saphenous vein is extremely diminutive. Varicosities are present in the thigh medially hand anteriorly. IMPRESSION: No evidence of DVT. Successful occlusion of the greater saphenous vein below the knee and successful occlusion of the medial branch of the greater saphenous vein. The greater saphenous vein in the thigh is abnormal. It is diminutive, with a very tiny patent  lumen and a marked thickening of the wall. Electronically Signed   By: Marybelle Killings M.D.   On: 11/12/2015 10:32   Ir Embo Venous Not [place] Guide Roadmapping  11/06/2015  CLINICAL DATA:  60 year old male with bilateral lower extremity venous insufficiency. Previous endovascular laser treatment to the left great saphenous vein with recurrent symptoms due to reflex through a branch vessel of the left great saphenous vein and patency of the left calf great saphenous vein. EXAM: ENDOVASCULAR LASER TREATMENT TO THE LEFT GREAT SAPHENOUS VEIN AND SUPERFICIAL BRANCH OF THE LEFT GREAT SAPHENOUS VEIN. Physician: Stephan Minister. Anselm Pancoast, MD MEDICATIONS: MEDICATIONS None. ANESTHESIA/SEDATION: None COMPLICATIONS: None immediate. PROCEDURE: The procedure was  explained to the patient. The risks and benefits of the procedure were discussed and that patient's questions were addressed. Informed consent was obtained from the patient. The patient was placed in a supine position. The left lower extremity superficial venous system was evaluated with ultrasound. The left great saphenous vein and superficial vein were identified and the vein path was marked on the skin. The left leg and let groin were prepped with chlorhexidine and a sterile drape was placed. The skin above the left ankle was anesthetized with 1% lidocaine. A 21 gauge needle was directed into the great saphenous vein with ultrasound guidance and a micropuncture dilator set was placed. Wire was advanced into the proximal proximal left calf. Wire would not advance to the knee. A 0.035 wire was advanced to the proximal calf with ultrasound guidance. A 65 cm sheath was placed over the wire and positioned in the proximal calf. Dilute lidocaine was used as a tumescent. 50 mL of tumescent was used in the left calf. The laser treatment was performed over 11 cm for 66 seconds. The sheath and laser were removed as a single entity. Manual compression was placed over the  percutaneous entry site. Attention was directed to the medial superficial vein coming off of the proximal thigh great saphenous vein. Due to the tumescent in the calf, the superficial vein was not well visualized below the knee. The skin above the knee was anesthetized with 1% lidocaine. Using ultrasound guidance, 21 gauge needle was directed into this superficial vein and a wire was advanced up the thigh. Multiple attempts were made to advance a 0.035 wire into the proximal thigh great saphenous vein. A hydrophilic wire would not advance beyond the junction of this superficial branch vein and the great saphenous vein in the upper thigh. Therefore, the laser sheath was placed in the proximal superficial branch vein. Laser was placed within the sheath. This superficial vein was just below the skin surface. Therefore, a large amount of tumescent was injected in order to get the vein approximately 1 cm below the skin surface. 275 mL of tumescent was used in the left thigh. 21 cm of the superficial vein was treated. The superficial vein was treated very slowly. Approximately every 5 cm, the leg was re-evaluated with ultrasound to ensure that the vein was sufficiently deep to the skin surface. Additional tumescent was injected during the laser treatment. The sheath and laser were removed as a single entity. Manual compression was placed at the vein exit site. Total joules in both segments was 946.4. IMPRESSION: Successful transcatheter laser occlusion of the left calf great saphenous vein and the medial branch of the left great saphenous vein in the left thigh. Electronically Signed   By: Markus Daft M.D.   On: 11/06/2015 14:08    Labs:  CBC:  Recent Labs  06/06/15 0837  WBC 6.7  HGB 15.5  HCT 46.3  PLT 228.0    COAGS: No results for input(s): INR, APTT in the last 8760 hours.  BMP:  Recent Labs  06/06/15 0837  NA 140  K 3.7  CL 103  CO2 30  GLUCOSE 90  BUN 25*  CALCIUM 9.0  CREATININE 0.75      LIVER FUNCTION TESTS:  Recent Labs  06/06/15 0837  BILITOT 1.6*  AST 18  ALT 19  ALKPHOS 49  PROT 6.4  ALBUMIN 4.1    TUMOR MARKERS: No results for input(s): AFPTM, CEA, CA199, CHROMGRNA in the last 8760 hours.  Assessment and Plan:  Mr. Gelb has undergone successful occlusion of the treated segments of the left greater saphenous vein and medial branch of the left greater saphenous vein. The greater saphenous vein along the thigh is very diminutive with a thick wall. Hopefully this will undergo complete occlusion by the one-month interval. Other varicosities remain patent. These can be treated with sclerotherapy at the one-month interval as indicated.  Thank you for this interesting consult.  I greatly enjoyed meeting DERIEN GENNARO and look forward to participating in their care.  A copy of this report was sent to the requesting provider on this date.  Electronically Signed: Tiffane Sheldon, ART A 11/12/2015, 10:40 AM   I spent a total of   15 Minutes in face to face in clinical consultation, greater than 50% of which was counseling/coordinating care for venous insufficiency in the left lower extremity.

## 2015-12-06 MED FILL — RISEDRONATE NA 150 MG TAB: 150 | 28 days supply | Qty: 1 | Fill #10

## 2015-12-12 DIAGNOSIS — H5213 Myopia, bilateral: Secondary | ICD-10-CM | POA: Diagnosis not present

## 2015-12-12 DIAGNOSIS — H52223 Regular astigmatism, bilateral: Secondary | ICD-10-CM | POA: Diagnosis not present

## 2015-12-12 DIAGNOSIS — H524 Presbyopia: Secondary | ICD-10-CM | POA: Diagnosis not present

## 2015-12-24 ENCOUNTER — Other Ambulatory Visit: Payer: 59

## 2015-12-24 ENCOUNTER — Ambulatory Visit
Admission: RE | Admit: 2015-12-24 | Discharge: 2015-12-24 | Disposition: A | Discharge status: Home / Self Care | Payer: 59 | Source: Ambulatory Visit | Attending: Diagnostic Radiology | Admitting: Diagnostic Radiology

## 2015-12-24 ENCOUNTER — Ambulatory Visit: Payer: 59

## 2015-12-24 ENCOUNTER — Other Ambulatory Visit (HOSPITAL_COMMUNITY): Payer: Self-pay | Admitting: Diagnostic Radiology

## 2015-12-24 DIAGNOSIS — I872 Venous insufficiency (chronic) (peripheral): Secondary | ICD-10-CM | POA: Diagnosis not present

## 2015-12-24 DIAGNOSIS — I83813 Varicose veins of bilateral lower extremities with pain: Secondary | ICD-10-CM

## 2015-12-24 DIAGNOSIS — I82812 Embolism and thrombosis of superficial veins of left lower extremities: Secondary | ICD-10-CM | POA: Diagnosis not present

## 2015-12-24 NOTE — Consult Note (Signed)
Chief Complaint: Patient was seen in consultation today for follow-up of venous insufficiency and posttreatment.   Referring Physician(s): Lilith Solana  History of Present Illness: Erik [name] is a 60 y.o. male with known bilateral lower extremity venous insufficiency. Patient recently had the left great saphenous vein below the knee and a medial branch of the left great saphenous vein in the thigh treated with the endovascular laser. The patient was symptomatic following the procedure due to the superficial location of the medial branch but those symptoms have resolved. He continues to feel the occluded vein on the medial aspect of the thigh. Overall, the patient is very happy with the results of the procedure. The tenderness and pain left calf have markedly improved. He continues to have some tenderness associated with the superficial veins and varicosities in the shin and medial calf. Patient also has right lower extremity symptoms which have not changed. Patient wore the compression stockings regularly following the procedure.  Past Medical History  Diagnosis Date  . ALLERGIC RHINITIS 12/19/2009  . HYPOGONADISM, MALE 08/11/2007    f/u by Dr Thomasene Mohair urology    . OSTEOPOROSIS, IDIOPATHIC 08/11/2007    f/u at [facility] , now improved to osteopenia  . Raynaud's disease /phenomenon 08/16/2012  . Back pain chronic     On hydrocodone-Neurontin  . Blepharitis     B, chronic  . Chronic prostatitis      f/u by urology, has a prostate massage prn  . BPH (benign prostatic hyperplasia)   . Varicose veins   . Rosacea   . Shingles 09-2014  . 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 Groin injury     Past Surgical History  Procedure Laterality Date  . Cervical fusion  1994    C4-C5-C6 fusion  . Transurethral resection of prostate  07-2009  . Blepharoplasty      B blepharoplasty @ [facility] 08-2011    Allergies: Sulfa drugs cross reactors  Medications: Prior to Admission medications     Medication Sig Start Date End Date Taking? Authorizing Provider  amLODipine (NORVASC) 5 MG tablet Take 5 mg by mouth daily. Prn Reynaud's    Historical Provider, MD  azelastine (ASTELIN) 0.1 % nasal spray 2 sprays each nares q day Patient not taking: Reported on 11/06/2015 08/09/15   Mackie Pai, PA-C  cyclobenzaprine (FLEXERIL) 10 MG tablet Take 1 tablet (10 mg total) by mouth at bedtime as needed for muscle spasms. 05/17/14   Colon Branch, MD  loratadine (CLARITIN) 10 MG tablet Take 10 mg by mouth daily.    Historical Provider, MD  naproxen (NAPROSYN) 250 MG tablet Take by mouth 2 (two) times daily with a meal. Reported on 08/02/2015    Historical Provider, MD  omeprazole (PRILOSEC) 40 MG capsule TAKE ONE CAPSULE BY MOUTH DAILY 11/01/15   Colon Branch, MD  risedronate (ACTONEL) 150 MG tablet Take 150 mg by mouth every 30 (thirty) days.  01/23/14   Historical Provider, MD  tadalafil (CIALIS) 5 MG tablet Take 5 mg by mouth daily.     Historical Provider, MD  testosterone cypionate (DEPOTESTOTERONE CYPIONATE) 100 MG/ML injection Inject into the muscle every 14 (fourteen) days. For IM use only    Historical Provider, MD     Family History  Problem Relation Age of Onset  . COPD Father     Died, 71  . Hypertension Father   . Stroke Father   . Diabetes Mother     M, GP  .  Hypertension Mother   . CAD Mother     F, M, GP  . Colon polyps Mother   . Cancer Mother     Ovarian cancer  . Hypertension Sister   . Fibromyalgia Sister   . Hyperlipidemia Sister   . Prostate cancer Neg Hx   . Colon cancer Maternal Aunt     Social History   Social History  . Marital Status: Married    Spouse Name: N/A  . Number of Children: 3  . Years of Education: N/A   Occupational History  . Media planner for [employer]      Social History Main Topics  . Smoking status: Never Smoker   . Smokeless tobacco: Never Used  . Alcohol Use: No  . Drug Use: No  . Sexual Activity: Not on file   Other  Topics Concern  . Not on file   Social History Narrative   Lives with wife.  They have three children.   Daughter is an Ship broker        Review of Systems  Constitutional: Negative for activity change.  Musculoskeletal: Positive for back pain.    Vital Signs: There were no vitals taken for this visit.  Physical Exam  Constitutional: He appears well-developed and well-nourished.  Musculoskeletal:  Occluded superificial vein in medial left thigh is palpable. No erythema. Puncture sites in the left lower extremity are healed. No swelling.  Small visible and palpable varicosities along the anterior shin and medial left calf. Patient has point tenderness at these varicosities.    Imaging: US Venous Img Lower Unilateral Left  12/24/2015  CLINICAL DATA:  Follow-up endovascular treatment to the left lower extremity. Patient underwent endovascular treatment to the left great saphenous vein in the calf and a medial superficial branch of the left great saphenous vein in the thigh on 11/06/2015. EXAM: LEFT LOWER EXTREMITY VENOUS DOPPLER ULTRASOUND TECHNIQUE: Gray-scale sonography with graded compression, as well as color Doppler and duplex ultrasound, were performed to evaluate the deep venous system from the level of the common femoral vein through the popliteal and proximal calf veins. Spectral Doppler was utilized to evaluate flow at rest and with distal augmentation maneuvers. Evaluation of the superficial venous system was also performed. COMPARISON:  11/12/2015 FINDINGS: Normal compressibility, augmentation and color Doppler flow in the left common femoral vein, left femoral vein and left popliteal vein. Visualized left deep calf veins are patent without thrombus. Left profunda femoral vein is patent without thrombus. The left great saphenous vein is small and occluded. Left great saphenous vein is occluded in the distal calf. The treated medial branch the left great saphenous vein in the  thigh is occluded. A very short segment of this vessel is patent along the medial aspect of the knee. There are patent varicosities along the medial and anterior calf. These varicosities are associated with deep perforator branches. IMPRESSION: The treated left great saphenous vein is occluded. The treated medial branch of the left great saphenous vein in the thigh is occluded. Small portion of this vein at the knee is patent but this is below the treatment zone. 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 Electronically Signed   By: Markus Daft M.D.   On: 12/24/2015 12:02    Labs:  CBC:  Recent Labs  06/06/15 0837  WBC 6.7  HGB 15.5  HCT 46.3  PLT 228.0    COAGS: No results for input(s): INR, APTT in the last 8760 hours.  BMP:  Recent  Labs  06/06/15 0837  NA 140  K 3.7  CL 103  CO2 30  GLUCOSE 90  BUN 25*  CALCIUM 9.0  CREATININE 0.75    LIVER FUNCTION TESTS:  Recent Labs  06/06/15 0837  BILITOT 1.6*  AST 18  ALT 19  ALKPHOS 49  PROT 6.4  ALBUMIN 4.1    TUMOR MARKERS: No results for input(s): AFPTM, CEA, CA199, CHROMGRNA in the last 8760 hours.  Assessment and Plan:  60 year-old with bilateral lower extremity superficial venous insufficiency. The treated segment of the left great saphenous vein below the knee and the medial branch of the left great saphenous vein in the thigh are occluded. Patient's symptoms have markedly improved following the endovascular laser treatment.  Patient has residual symptoms with the superficial veins and varicosities in the left calf. These vessels are patent based on ultrasound and associated with perforator disease. These varicosities were successfully treated with ultrasound-guided foam sclerotherapy. Patient tolerated the procedure well without complication. He has been instructed to wear the compression stockings regularly for the next week. Patient will follow-up in approximately 1 month. At that time, we  will evaluate the superficial veins to see if additional sclerotherapy is needed. We are planning on treating the right lower extremity in the fall.   Electronically Signed: Carylon Perches 12/24/2015, 12:12 PM   I spent a total of  15 Minutes in face to face in clinical consultation, greater than 50% of which was counseling/coordinating care for lower extremity venous insufficiency.

## 2015-12-31 MED FILL — CYCLOBENZAPRINE 10 MG TAB: 10 | 30 days supply | Qty: 60 | Fill #3

## 2015-12-31 MED FILL — RISEDRONATE NA 150 MG TAB: 150 | 28 days supply | Qty: 1 | Fill #11

## 2015-12-31 MED FILL — TESTOSTERONE CYP 200 MG/ML: 200 | 84 days supply | Qty: 6 | Fill #0

## 2016-01-17 DIAGNOSIS — E291 Testicular hypofunction: Secondary | ICD-10-CM | POA: Diagnosis not present

## 2016-01-29 DIAGNOSIS — N400 Enlarged prostate without lower urinary tract symptoms: Secondary | ICD-10-CM | POA: Diagnosis not present

## 2016-01-29 DIAGNOSIS — N411 Chronic prostatitis: Secondary | ICD-10-CM | POA: Diagnosis not present

## 2016-02-03 MED FILL — RISEDRONATE NA 150 MG TAB: 150 | 1 days supply | Qty: 1 | Fill #0

## 2016-02-05 ENCOUNTER — Encounter: Payer: Self-pay | Admitting: Internal Medicine

## 2016-02-11 ENCOUNTER — Other Ambulatory Visit (HOSPITAL_COMMUNITY): Payer: Self-pay | Admitting: Diagnostic Radiology

## 2016-02-11 ENCOUNTER — Ambulatory Visit
Admission: RE | Admit: 2016-02-11 | Discharge: 2016-02-11 | Disposition: A | Discharge status: Home / Self Care | Payer: 59 | Source: Ambulatory Visit | Attending: Diagnostic Radiology | Admitting: Diagnostic Radiology

## 2016-02-11 DIAGNOSIS — I82812 Embolism and thrombosis of superficial veins of left lower extremities: Secondary | ICD-10-CM | POA: Diagnosis not present

## 2016-02-11 DIAGNOSIS — I872 Venous insufficiency (chronic) (peripheral): Secondary | ICD-10-CM | POA: Diagnosis not present

## 2016-02-11 DIAGNOSIS — I83813 Varicose veins of bilateral lower extremities with pain: Secondary | ICD-10-CM

## 2016-02-11 DIAGNOSIS — I83892 Varicose veins of left lower extremities with other complications: Secondary | ICD-10-CM | POA: Diagnosis not present

## 2016-02-11 DIAGNOSIS — I8393 Asymptomatic varicose veins of bilateral lower extremities: Secondary | ICD-10-CM

## 2016-02-11 NOTE — Progress Notes (Signed)
Chief Complaint: Patient was seen in follow-up today for venous insufficiency at the request of [name],Erik  Referring Physician(s): [name],Erik  History of Present Illness: Erik [name] is a 60 y.o. male with a history of recurrent symptomatic left lower extremity venous insufficiency. He underwent endovenous laser ablation of his incompetent left great saphenous vein in the calf as well as a posterior accessory great saphenous vein in the upper and mid thigh this past May.  He subsequently underwent adjunct of per cutaneous sclerotherapy of residual venous varicosities and presents today for one month follow-up evaluation. Overall, he continues to be pleased with his response to therapy. His overall left lower extremity heaviness, aching, and fatigue are improved. However, he does have some persistent bulging superficial venous varicosities along the anterior aspect of his mid shin and medially from the mid calf to the medial malleolus. These are occasionally symptomatic, particularly at night when it can interfere with his sleep.  Past Medical History:  Diagnosis Date  . 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 ALLERGIC RHINITIS 12/19/2009  . Back pain chronic    On hydrocodone-Neurontin  . Blepharitis    B, chronic  . BPH (benign prostatic hyperplasia)   . Chronic prostatitis     f/u by urology, has a prostate massage prn  . Groin injury   . HYPOGONADISM, MALE 08/11/2007   f/u by Dr Erik [name] urology    . OSTEOPOROSIS, IDIOPATHIC 08/11/2007   f/u at [facility] , now improved to osteopenia  . Raynaud's disease /phenomenon 08/16/2012  . Rosacea   . Shingles 09-2014  . Varicose veins     Past Surgical History:  Procedure Laterality Date  . BLEPHAROPLASTY     B blepharoplasty @ [facility] 08-2011  . CERVICAL FUSION  1994   C4-C5-C6 fusion  . TRANSURETHRAL RESECTION OF PROSTATE  07-2009    Allergies: Sulfa drugs cross reactors  Medications: Prior to Admission medications     Medication Sig Start Date End Date Taking? Authorizing Provider  amLODipine (NORVASC) 5 MG tablet Take 5 mg by mouth daily. Prn Reynaud's    Historical Provider, MD  azelastine (ASTELIN) 0.1 % nasal spray 2 sprays each nares q day Patient not taking: Reported on 11/06/2015 08/09/15   Erik Pai, PA-C  cyclobenzaprine (FLEXERIL) 10 MG tablet Take 1 tablet (10 mg total) by mouth at bedtime as needed for muscle spasms. 05/17/14   Erik Branch, MD  loratadine (CLARITIN) 10 MG tablet Take 10 mg by mouth daily.    Historical Provider, MD  naproxen (NAPROSYN) 250 MG tablet Take by mouth 2 (two) times daily with a meal. Reported on 08/02/2015    Historical Provider, MD  omeprazole (PRILOSEC) 40 MG capsule TAKE ONE CAPSULE BY MOUTH DAILY 11/01/15   Erik Branch, MD  risedronate (ACTONEL) 150 MG tablet Take 150 mg by mouth every 30 (thirty) days.  01/23/14   Historical Provider, MD  tadalafil (CIALIS) 5 MG tablet Take 5 mg by mouth daily.     Historical Provider, MD  testosterone cypionate (DEPOTESTOTERONE CYPIONATE) 100 MG/ML injection Inject into the muscle every 14 (fourteen) days. For IM use only    Historical Provider, MD     Family History  Problem Relation Age of Onset  . COPD Father     Died, 71  . Hypertension Father   . Stroke Father   . Diabetes Mother     M, GP  . Hypertension Mother   . CAD Mother  F, M, GP  . Erik polyps Mother   . Cancer Mother     Ovarian cancer  . Hypertension Sister   . Fibromyalgia Sister   . Hyperlipidemia Sister   . Prostate cancer Neg Hx   . Erik cancer Maternal Aunt     Social History   Social History  . Marital status: Married    Spouse name: N/A  . Number of children: 3  . Years of education: N/A   Occupational History  . 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 Smoking status: Never Smoker  . Smokeless tobacco: Never Used  . Alcohol use No  . Drug use: No  . Sexual activity: Not on  file   Other Topics Concern  . Not on file   Social History Narrative   Lives with wife.  They have three children.   Daughter is an Ship broker      Review of Systems: A 12 point ROS discussed and pertinent positives are indicated in the HPI above.  All other systems are negative.  Review of Systems  Vital Signs: There were no vitals taken for this visit.  Physical Exam  Constitutional: He is oriented to person, place, and time. He appears well-developed and well-nourished. No distress.  HENT:  Head: Normocephalic and atraumatic.  Eyes: No scleral icterus.  Cardiovascular: Normal rate.   Pulmonary/Chest: Effort normal.  Abdominal: Soft.  Musculoskeletal:       Legs: Region of residual symptomatic varices  Neurological: He is alert and oriented to person, place, and time.  Skin: Skin is warm and dry.  Nursing note and vitals reviewed.   Mallampati Score:     Imaging: US Venous Img Lower Unilateral Left  Result Date: 02/11/2016 CLINICAL DATA:  60 year old male with a history of bilateral, left worse than right, superficial venous insufficiency. He underwent treatment of his left great saphenous vein and posterior accessory great saphenous vein on 11/06/2015. Subsequently, he had sclerotherapy treatment on 12/24/2015 and presents today for follow-up. He continues to have a few bulging and symptomatic veins in the medial aspect of his distal lower leg from the mid calf to the ankle, and anteriorly along the shin. EXAM: LEFT LOWER EXTREMITY VENOUS DUPLEX ULTRASOUND TECHNIQUE: Gray-scale sonography with graded compression, as well as color Doppler and duplex ultrasound, were performed to evaluate the deep and superficial veins of both lower extremities. Spectral Doppler was utilized to evaluate flow at rest and with distal augmentation maneuvers. A complete superficial venous insufficiency exam was performed in the Trendelenburg position. I personally performed the technical portion  of the exam. COMPARISON:  Most recent prior duplex venous ultrasound 12/24/2015 FINDINGS: Deep Venous System: Evaluation of the deep venous system including the common femoral, femoral, profunda femoral, popliteal and calf veins (where visible) demonstrate no evidence of deep venous thrombosis. The vessels are compressible and demonstrate normal respiratory phasicity and response to augmentation. No evidence of deep venous reflux. Superficial Venous System: SFJ: Patent. GSV Prox Thigh: Occluded GSV Mid Thigh: Occluded GSV Lower Thigh: Occluded GSV at Knee: Occluded GSV Prox Calf: Occluded GSV Mid Calf: Occluded GSV Distal Calf: Occluded Other: There are numerous superficial collaterals in the medial aspect of the left calf and at the ankle. The majority of these are occluded secondary to prior sclerotherapy treatment. However, there are several residual patent venous varicosities just above the medial malleolus, and 1 antrum medially which extends across the anterior shin. These correspond  with the sites of persistent clinical symptoms. IMPRESSION: 1. 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 2. No evidence of deep venous thrombosis. 3. There are a few persistent patent superficial venous varicosities in the region of persistent clinical symptoms. These are amenable to percutaneous sclerotherapy. Signed, Criselda Peaches, MD [facility] Electronically Signed   By: Jacqulynn Cadet M.D.   On: 02/11/2016 09:38   [place] Injec Sclerotherapy Single  Result Date: 02/11/2016 CLINICAL DATA:  Persistent symptomatic varicose veins of the anterior and medial lower leg despite technically successful laser closure of the refluxing great saphenous vein. EXAM: ULTRASOUND INJECTION SCLEROTHERAPHY MULTIPLE MEDICATIONS: MEDICATIONS None. ANESTHESIA/SEDATION: None. COMPLICATIONS: None immediate. PROCEDURE: Informed written consent was obtained from the patient  after a thorough discussion of the procedural risks, benefits and alternatives. All questions were addressed. A timeout was performed prior to the initiation of the procedure. Survey ultrasound of the left lower extremity was performed and appropriate skin entry sites (3) were marked. Sites were prepped with chlorhexidine and under direct ultrasound guidance a foam of 1% polidocanol was injected in small aliquots into the residual patent symptomatic varicose veins until there was good filling of the affected regions. IMPRESSION: Technically successful foam sclerotherapy of residual symptomatic left lower extremity varicose veins. Signed, Criselda Peaches, MD [facility] Electronically Signed   By: Jacqulynn Cadet M.D.   On: 02/11/2016 09:43    Labs:  CBC:  Recent Labs  06/06/15 0837  WBC 6.7  HGB 15.5  HCT 46.3  PLT 228.0    COAGS: No results for input(s): INR, APTT in the last 8760 hours.  BMP:  Recent Labs  06/06/15 0837  NA 140  K 3.7  CL 103  CO2 30  GLUCOSE 90  BUN 25*  CALCIUM 9.0  CREATININE 0.75    LIVER FUNCTION TESTS:  Recent Labs  06/06/15 0837  BILITOT 1.6*  AST 18  ALT 19  ALKPHOS 49  PROT 6.4  ALBUMIN 4.1    TUMOR MARKERS: No results for input(s): AFPTM, CEA, CA199, CHROMGRNA in the last 8760 hours.  Assessment and Plan:  Significantly improved symptoms of left lower extremity venous insufficiency compared to pretreatment. However, there are a few persistent symptomatic superficial varicosities along the anterior and medial aspect of the left lower leg from the mid shin to the ankle.  These lesions were successfully treated today with percutaneous sclerotherapy using a foam of 1% polidocanol.  1.) Continue daily use of compression hose for the next week and then as needed after that. 2.) We will have our nurse call in 1 month. If this gentleman is still experiencing symptoms we will  bring him in for a follow-up visit.   Electronically Signed: Jacqulynn Cadet 02/11/2016, 12:10 PM   I spent a total of 15 Minutes in face to face in clinical consultation, greater than 50% of which was counseling/coordinating care for venous insufficiency and symptomatic varicose veins.

## 2016-02-17 MED FILL — OMEPRAZOLE DR 40 MG CAPSULE: 40 | 30 days supply | Qty: 30 | Fill #1

## 2016-02-17 MED FILL — RISEDRONATE NA 150 MG TAB: 150 | 1 days supply | Qty: 1 | Fill #1

## 2016-02-24 ENCOUNTER — Other Ambulatory Visit (HOSPITAL_COMMUNITY): Payer: Self-pay | Admitting: Diagnostic Radiology

## 2016-02-24 DIAGNOSIS — I83813 Varicose veins of bilateral lower extremities with pain: Secondary | ICD-10-CM

## 2016-02-25 ENCOUNTER — Ambulatory Visit
Admission: RE | Admit: 2016-02-25 | Discharge: 2016-02-25 | Disposition: A | Discharge status: Home / Self Care | Payer: 59 | Source: Ambulatory Visit | Attending: Diagnostic Radiology | Admitting: Diagnostic Radiology

## 2016-02-25 DIAGNOSIS — I83812 Varicose veins of left lower extremities with pain: Secondary | ICD-10-CM | POA: Diagnosis not present

## 2016-02-25 DIAGNOSIS — I83813 Varicose veins of bilateral lower extremities with pain: Secondary | ICD-10-CM

## 2016-02-25 DIAGNOSIS — M25572 Pain in left ankle and joints of left foot: Secondary | ICD-10-CM | POA: Diagnosis not present

## 2016-02-25 DIAGNOSIS — E291 Testicular hypofunction: Secondary | ICD-10-CM | POA: Diagnosis not present

## 2016-02-25 DIAGNOSIS — N401 Enlarged prostate with lower urinary tract symptoms: Secondary | ICD-10-CM | POA: Diagnosis not present

## 2016-02-25 DIAGNOSIS — N138 Other obstructive and reflux uropathy: Secondary | ICD-10-CM | POA: Diagnosis not present

## 2016-02-25 DIAGNOSIS — N411 Chronic prostatitis: Secondary | ICD-10-CM | POA: Diagnosis not present

## 2016-02-25 NOTE — Progress Notes (Signed)
Patient ID: KAHN LEDON, male   DOB: 01-11-1956, 60 y.o.   MRN: [id number]    Chief Complaint: Left ankle pain after sclerotherapy  Referring Physician(s): Henn,Adam  Supervising Physician: Marybelle Killings  Patient Status: Outpatient  History of Present Illness: TANYA RUNNING is a 60 y.o. male who underwent sclerotherapy in the left calf and ankle 2 weeks ago. He complains of increasing swelling and pain at the left ankle.  Past Medical History:  Diagnosis Date  . 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 ALLERGIC RHINITIS 12/19/2009  . Back pain chronic    On hydrocodone-Neurontin  . Blepharitis    B, chronic  . BPH (benign prostatic hyperplasia)   . Chronic prostatitis     f/u by urology, has a prostate massage prn  . Groin injury   . HYPOGONADISM, MALE 08/11/2007   f/u by Dr Thomasene Mohair urology    . OSTEOPOROSIS, IDIOPATHIC 08/11/2007   f/u at [facility] , now improved to osteopenia  . Raynaud's disease /phenomenon 08/16/2012  . Rosacea   . Shingles 09-2014  . Varicose veins     Past Surgical History:  Procedure Laterality Date  . BLEPHAROPLASTY     B blepharoplasty @ [facility] 08-2011  . CERVICAL FUSION  1994   C4-C5-C6 fusion  . TRANSURETHRAL RESECTION OF PROSTATE  07-2009    Allergies: Sulfa drugs cross reactors  Medications: Prior to Admission medications   Medication Sig Start Date End Date Taking? Authorizing Provider  amLODipine (NORVASC) 5 MG tablet Take 5 mg by mouth daily. Prn Reynaud's    Historical Provider, MD  azelastine (ASTELIN) 0.1 % nasal spray 2 sprays each nares q day Patient not taking: Reported on 11/06/2015 08/09/15   Mackie Pai, PA-C  cyclobenzaprine (FLEXERIL) 10 MG tablet Take 1 tablet (10 mg total) by mouth at bedtime as needed for muscle spasms. 05/17/14   Colon Branch, MD  loratadine (CLARITIN) 10 MG tablet Take 10 mg by mouth daily.    Historical Provider, MD  naproxen (NAPROSYN) 250 MG tablet Take by mouth 2 (two) times daily with a  meal. Reported on 08/02/2015    Historical Provider, MD  omeprazole (PRILOSEC) 40 MG capsule TAKE ONE CAPSULE BY MOUTH DAILY 11/01/15   Colon Branch, MD  risedronate (ACTONEL) 150 MG tablet Take 150 mg by mouth every 30 (thirty) days.  01/23/14   Historical Provider, MD  tadalafil (CIALIS) 5 MG tablet Take 5 mg by mouth daily.     Historical Provider, MD  testosterone cypionate (DEPOTESTOTERONE CYPIONATE) 100 MG/ML injection Inject into the muscle every 14 (fourteen) days. For IM use only    Historical Provider, MD     Family History  Problem Relation Age of Onset  . COPD Father     Died, 71  . Hypertension Father   . Stroke Father   . Diabetes Mother     M, GP  . Hypertension Mother   . CAD Mother     F, M, GP  . Colon polyps Mother   . Cancer Mother     Ovarian cancer  . Hypertension Sister   . Fibromyalgia Sister   . Hyperlipidemia Sister   . Prostate cancer Neg Hx   . Colon cancer Maternal Aunt     Social History   Social History  . Marital status: Married    Spouse name: N/A  . Number of children: 3  . Years of education: N/A   Occupational History  . Media planner  for [employer]   [place] History Main Topics  . Smoking status: Never Smoker  . Smokeless tobacco: Never Used  . Alcohol use No  . Drug use: No  . Sexual activity: Not on file   Other Topics Concern  . Not on file   Social History Narrative   Lives with wife.  They have three children.   Daughter is an Ship broker       Review of Systems: A 12 point ROS discussed and pertinent positives are indicated in the HPI above.  All other systems are negative.  Review of Systems  Vital Signs: There were no vitals taken for this visit.  Physical Exam  Constitutional: He is oriented to person, place, and time. He appears well-developed and well-nourished.  HENT:  Head: Normocephalic and atraumatic.  Musculoskeletal:  There is swelling of the left ankle and erythema.  Pulses are intact distally. No skin breakdown.  Neurological: He is alert and oriented to person, place, and time.    Mallampati Score:     Imaging: US Venous Img Lower Unilateral Left  Result Date: 02/11/2016 CLINICAL DATA:  60 year old male with a history of bilateral, left worse than right, superficial venous insufficiency. He underwent treatment of his left great saphenous vein and posterior accessory great saphenous vein on 11/06/2015. Subsequently, he had sclerotherapy treatment on 12/24/2015 and presents today for follow-up. He continues to have a few bulging and symptomatic veins in the medial aspect of his distal lower leg from the mid calf to the ankle, and anteriorly along the shin. EXAM: LEFT LOWER EXTREMITY VENOUS DUPLEX ULTRASOUND TECHNIQUE: Gray-scale sonography with graded compression, as well as color Doppler and duplex ultrasound, were performed to evaluate the deep and superficial veins of both lower extremities. Spectral Doppler was utilized to evaluate flow at rest and with distal augmentation maneuvers. A complete superficial venous insufficiency exam was performed in the Trendelenburg position. I personally performed the technical portion of the exam. COMPARISON:  Most recent prior duplex venous ultrasound 12/24/2015 FINDINGS: Deep Venous System: Evaluation of the deep venous system including the common femoral, femoral, profunda femoral, popliteal and calf veins (where visible) demonstrate no evidence of deep venous thrombosis. The vessels are compressible and demonstrate normal respiratory phasicity and response to augmentation. No evidence of deep venous reflux. Superficial Venous System: SFJ: Patent. GSV Prox Thigh: Occluded GSV Mid Thigh: Occluded GSV Lower Thigh: Occluded GSV at Knee: Occluded GSV Prox Calf: Occluded GSV Mid Calf: Occluded GSV Distal Calf: Occluded Other: There are numerous superficial collaterals in the medial aspect of the left calf and at the ankle. The  majority of these are occluded secondary to prior sclerotherapy treatment. However, there are several residual patent venous varicosities just above the medial malleolus, and 1 antrum medially which extends across the anterior shin. These correspond with the sites of persistent clinical symptoms. IMPRESSION: 1. 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 2. No evidence of deep venous thrombosis. 3. There are a few persistent patent superficial venous varicosities in the region of persistent clinical symptoms. These are amenable to percutaneous sclerotherapy. Signed, Criselda Peaches, MD [facility] Radiology Electronically Signed   By: Jacqulynn Cadet M.D.   On: 02/11/2016 09:38   [place] Injec Sclerotherapy Single  Result Date: 02/11/2016 CLINICAL DATA:  Persistent symptomatic varicose veins of the anterior and medial lower leg despite technically successful laser closure of the refluxing great saphenous vein. EXAM: ULTRASOUND INJECTION SCLEROTHERAPHY MULTIPLE  MEDICATIONS: MEDICATIONS None. ANESTHESIA/SEDATION: None. COMPLICATIONS: None immediate. PROCEDURE: Informed written consent was obtained from the patient after a thorough discussion of the procedural risks, benefits and alternatives. All questions were addressed. A timeout was performed prior to the initiation of the procedure. Survey ultrasound of the left lower extremity was performed and appropriate skin entry sites (3) were marked. Sites were prepped with chlorhexidine and under direct ultrasound guidance a foam of 1% polidocanol was injected in small aliquots into the residual patent symptomatic varicose veins until there was good filling of the affected regions. IMPRESSION: Technically successful foam sclerotherapy of residual symptomatic left lower extremity varicose veins. Signed, Criselda Peaches, MD [facility] Radiology Electronically  Signed   By: Jacqulynn Cadet M.D.   On: 02/11/2016 09:43    Labs:  CBC:  Recent Labs  06/06/15 0837  WBC 6.7  HGB 15.5  HCT 46.3  PLT 228.0    COAGS: No results for input(s): INR, APTT in the last 8760 hours.  BMP:  Recent Labs  06/06/15 0837  NA 140  K 3.7  CL 103  CO2 30  GLUCOSE 90  BUN 25*  CALCIUM 9.0  CREATININE 0.75    LIVER FUNCTION TESTS:  Recent Labs  06/06/15 0837  BILITOT 1.6*  AST 18  ALT 19  ALKPHOS 49  PROT 6.4  ALBUMIN 4.1    TUMOR MARKERS: No results for input(s): AFPTM, CEA, CA199, CHROMGRNA in the last 8760 hours.  Assessment and Plan:  Mr. Humpert has undergone successful occlusion of several varicosities at the left ankle as well as a perforating branch. This is likely the source of the pain and swelling. This should subside over the next 2 weeks. He has no evidence of DVT and this is an expected result.  Thank you for this interesting consult.  I greatly enjoyed meeting MARTRELL RUMMLER and look forward to participating in their care.  A copy of this report was sent to the requesting provider on this date.  Electronically Signed: Jacob Chamblee, ART A 02/25/2016, 2:48 PM   I spent a total of   15 Minutes in face to face in clinical consultation, greater than 50% of which was counseling/coordinating care for venous insufficiency and sclerotherapy.

## 2016-03-17 ENCOUNTER — Ambulatory Visit
Admission: RE | Admit: 2016-03-17 | Discharge: 2016-03-17 | Disposition: A | Discharge status: Home / Self Care | Payer: 59 | Source: Ambulatory Visit | Attending: Diagnostic Radiology | Admitting: Diagnostic Radiology

## 2016-03-17 ENCOUNTER — Other Ambulatory Visit (HOSPITAL_COMMUNITY): Payer: Self-pay | Admitting: Diagnostic Radiology

## 2016-03-17 DIAGNOSIS — I8393 Asymptomatic varicose veins of bilateral lower extremities: Secondary | ICD-10-CM

## 2016-03-17 DIAGNOSIS — I83811 Varicose veins of right lower extremities with pain: Secondary | ICD-10-CM | POA: Diagnosis not present

## 2016-03-17 HISTORY — PX: IR GENERIC HISTORICAL: IMG1180011

## 2016-03-17 MED ORDER — DIAZEPAM 5 MG PO TABS
10.0000 mg | ORAL_TABLET | Freq: Once | ORAL | Status: AC
  Administered 2016-03-17: 10 mg via ORAL

## 2016-03-17 NOTE — Progress Notes (Signed)
Escorted patient around the office so he could complete his ten minutes of ambulation post-procedure.  Tolerated this activity without complaint.  Gait steady.  Escorted to lobby with discharge instructions and follow-up visit information.  Brita Romp, RN

## 2016-03-18 MED FILL — TESTOSTERONE CYP 200 MG/ML: 200 | 84 days supply | Qty: 6 | Fill #1

## 2016-03-25 ENCOUNTER — Other Ambulatory Visit: Payer: 59

## 2016-03-25 ENCOUNTER — Encounter: Payer: Self-pay | Admitting: Internal Medicine

## 2016-03-25 DIAGNOSIS — Q828 Other specified congenital malformations of skin: Secondary | ICD-10-CM

## 2016-03-25 NOTE — Telephone Encounter (Signed)
See message, enter a dermatology referral, needs to be in network  Received: Today  [place], MD  Damita Dunnings, CMA   Referral placed.

## 2016-03-26 ENCOUNTER — Ambulatory Visit
Admission: RE | Admit: 2016-03-26 | Discharge: 2016-03-26 | Disposition: A | Discharge status: Home / Self Care | Payer: 59 | Source: Ambulatory Visit | Attending: Diagnostic Radiology | Admitting: Diagnostic Radiology

## 2016-03-26 ENCOUNTER — Other Ambulatory Visit: Payer: 59

## 2016-03-26 DIAGNOSIS — I739 Peripheral vascular disease, unspecified: Secondary | ICD-10-CM | POA: Diagnosis not present

## 2016-03-26 DIAGNOSIS — I8393 Asymptomatic varicose veins of bilateral lower extremities: Secondary | ICD-10-CM | POA: Diagnosis not present

## 2016-03-26 NOTE — Progress Notes (Signed)
Patient ID: Erik [name], male   DOB: 10-30-1955, 60 y.o.   MRN: [id number]       Chief Complaint: 1 week status post right GSV transcatheter laser occlusion for venous insufficiency, leg pain and varicose veins.  Referring Physician(s): Henn,Adam  History of Present Illness: Erik [name] is a 60 y.o. male with lower extremity GSV venous insufficiency, leg pain and varicose veins. He returns for outpatient follow-up. He is now one week status post right GSV transcatheter laser occlusion. He has recovered at home over the last week. He has been compliant with compression stockings and daily walking. Mild pain along the treated segment has resolved. No signs of cellulitis or phlebitis. No interval fevers. Overall he is doing very well. Outpatient ultrasound today confirms occlusion of the right GSV treated segment. No evidence of DVT.  Past Medical History:  Diagnosis Date  . 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 ALLERGIC RHINITIS 12/19/2009  . Back pain chronic    On hydrocodone-Neurontin  . Blepharitis    B, chronic  . BPH (benign prostatic hyperplasia)   . Chronic prostatitis     f/u by urology, has a prostate massage prn  . Groin injury   . HYPOGONADISM, MALE 08/11/2007   f/u by Dr Thomasene Mohair urology    . OSTEOPOROSIS, IDIOPATHIC 08/11/2007   f/u at [facility] , now improved to osteopenia  . Raynaud's disease /phenomenon 08/16/2012  . Rosacea   . Shingles 09-2014  . Varicose veins     Past Surgical History:  Procedure Laterality Date  . BLEPHAROPLASTY     B blepharoplasty @ [facility] 08-2011  . CERVICAL FUSION  1994   C4-C5-C6 fusion  . IR GENERIC HISTORICAL  03/17/2016   IR EMBO VENOUS NOT HEMORR HEMANG  INC GUIDE ROADMAPPING 03/17/2016 Greggory Keen, MD [facility] ULTRASOUND  . TRANSURETHRAL RESECTION OF PROSTATE  07-2009    Allergies: Sulfa drugs cross reactors  Medications: Prior to Admission medications   Medication Sig Start Date End Date Taking? Authorizing  Provider  amLODipine (NORVASC) 5 MG tablet Take 5 mg by mouth daily. Prn Reynaud's    Historical Provider, MD  azelastine (ASTELIN) 0.1 % nasal spray 2 sprays each nares q day Patient not taking: Reported on 11/06/2015 08/09/15   Mackie Pai, PA-C  cyclobenzaprine (FLEXERIL) 10 MG tablet Take 1 tablet (10 mg total) by mouth at bedtime as needed for muscle spasms. 05/17/14   Colon Branch, MD  Fish Oil-Cholecalciferol (FISH OIL + D3) 1000-1000 MG-UNIT CAPS Take 1,000 mg by mouth.    Historical Provider, MD  loratadine (CLARITIN) 10 MG tablet Take 10 mg by mouth daily.    Historical Provider, MD  Multiple Vitamin (MULTI-VITAMINS) TABS Take by mouth.    Historical Provider, MD  naproxen (NAPROSYN) 250 MG tablet Take by mouth 2 (two) times daily with a meal. Reported on 08/02/2015    Historical Provider, MD  omeprazole (PRILOSEC) 40 MG capsule TAKE ONE CAPSULE BY MOUTH DAILY 11/01/15   Colon Branch, MD  risedronate (ACTONEL) 150 MG tablet Take 150 mg by mouth every 30 (thirty) days.  01/23/14   Historical Provider, MD  tadalafil (CIALIS) 5 MG tablet Take 5 mg by mouth daily.     Historical Provider, MD  testosterone cypionate (DEPOTESTOTERONE CYPIONATE) 100 MG/ML injection Inject into the muscle every 14 (fourteen) days. For IM use only    Historical Provider, MD     Family History  Problem Relation Age of Onset  . COPD  Father     Died, 71  . Hypertension Father   . Stroke Father   . Diabetes Mother     M, GP  . Hypertension Mother   . CAD Mother     F, M, GP  . Colon polyps Mother   . Cancer Mother     Ovarian cancer  . Hypertension Sister   . Fibromyalgia Sister   . Hyperlipidemia Sister   . Prostate cancer Neg Hx   . Colon cancer Maternal Aunt     Social History   Social History  . Marital status: Married    Spouse name: N/A  . Number of children: 3  . Years of education: N/A   Occupational History  . 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 Smoking status: Never Smoker  . Smokeless tobacco: Never Used  . Alcohol use No  . Drug use: No  . Sexual activity: Not on file   Other Topics Concern  . Not on file   Social History Narrative   Lives with wife.  They have three children.   Daughter is an Ship broker      Review of Systems: A 12 point ROS discussed and pertinent positives are indicated in the HPI above.  All other systems are negative.  Review of Systems  Vital Signs: There were no vitals taken for this visit.  Physical Exam  Constitutional: He appears well-developed and well-nourished. No distress.  Musculoskeletal:  Right lower extremity GSv treated segment demonstrates no signs of redness or erythema. No signs of phlebitis or infection. No significant bruising. Entry site at the right ankle is nearly healed. No peripheral edema. Intact pulses.  Skin: He is not diaphoretic.    Imaging: US Venous Img Lower Unilateral Left  Result Date: 02/25/2016 CLINICAL DATA:  Two weeks status post sclerotherapy of left calf varicosity EXAM: LEFT LOWER EXTREMITY VENOUS DUPLEX ULTRASOUND TECHNIQUE: Doppler venous assessment of the left lower extremity deep venous system was performed, including characterization of spectral flow, compressibility, and phasicity. COMPARISON:  None. FINDINGS: There is complete compressibility of the common femoral, femoral, and popliteal veins. Doppler analysis demonstrates respiratory phasicity and augmentation of flow with calf compression. There is no evidence of DVT in the calf. Several varicosities as well as a perforating branch are thrombosed and noncompressible in the left calf and ankle medially. IMPRESSION: Successful occlusion of varicosities and a perforating branch in the left calf. No evidence of DVT in the left lower extremity. Electronically Signed   By: Marybelle Killings M.D.   On: 02/25/2016 14:45   US Venous Img Lower Unilateral Right  Result Date: 03/26/2016 CLINICAL  DATA:  One week status post right GSV transcatheter laser occlusion for venous insufficiency, varicose veins and leg pain EXAM: RIGHT LOWER EXTREMITY VENOUS DOPPLER ULTRASOUND TECHNIQUE: Gray-scale sonography with graded compression, as well as color Doppler and duplex ultrasound were performed to evaluate the lower extremity deep venous systems from the level of the common femoral vein and including the common femoral, femoral, profunda femoral, popliteal and calf veins including the posterior tibial, peroneal and gastrocnemius veins when visible. The superficial great saphenous vein was also interrogated. Spectral Doppler was utilized to evaluate flow at rest and with distal augmentation maneuvers in the common femoral, femoral and popliteal veins. COMPARISON:  None. FINDINGS: Contralateral Common Femoral Vein: Respiratory phasicity is normal and symmetric with the symptomatic side. No evidence of thrombus. Normal compressibility. Common Femoral  Vein: No evidence of thrombus. Normal compressibility, respiratory phasicity and response to augmentation. Saphenofemoral Junction: Treated GSV segment is occluded up to the saphenous femoral junction. Profunda Femoral Vein: No evidence of thrombus. Normal compressibility and flow on color Doppler imaging. Femoral Vein: No evidence of thrombus. Normal compressibility, respiratory phasicity and response to augmentation. Popliteal Vein: No evidence of thrombus. Normal compressibility, respiratory phasicity and response to augmentation. Calf Veins: No evidence of thrombus. Normal compressibility and flow on color Doppler imaging. Superficial Great Saphenous Vein: Entire right GSV segment is occluded following treatment 1 week ago. GSV is noncompressible with a thickened wall. Mid and distal calf sub surface varicosities are decompressed but remain patent and compressible. Venous Reflux:  None. Other Findings:  None. IMPRESSION: Right GSV treated segment is occluded from the  saphenous femoral junction to the ankle following treatment 1 week ago. Negative for DVT Small mid and distal calf sub surface varicosities remain patent. Electronically Signed   By: Jerilynn Mages.  Rosalie Buenaventura M.D.   On: 03/26/2016 10:30   Ir Embo Venous Not [place] Guide Roadmapping  Result Date: 03/17/2016 CLINICAL DATA:  Symptomatic lower extremity varicose veins. Right GSV venous insufficiency with symptomatic branching varicosities, peripheral edema, discomfort and pain. EXAM: TRANSCATHETER LASER OCCLUSION RIGHT GREATER SAPHENOUS VEIN WITH ULTRASOUND GUIDANCE: TECHNIQUE: Survey ultrasound of the leg was performed and the course of the greater saphenous vein was marked on the skin. Overlying skin prepped with CHLORAPREP, draped in usual sterile fashion. After local anesthetic using 1% lidocaine, the greater saphenous vein was accessed at the right ankle level under ultrasound with a 21-gauge micropuncture needle. A 018 guidewire advanced easily. This was exchanged using a transitional dilator for a 035 J wire. Over this, the 4 French delivery sheath was advanced beyond the saphenofemoral junction. The laser fiber was advanced and positioned 3 cm from the saphenofemoral junction. Tumescent dilute 0.1% lidocaine 436mL used along the planned treatment length. Ultrasound was used to confirm appropriate tumescent anesthesia and to verify that all segments were at least 1 cm from the skin surface. The laser fiber was activated while being withdrawn over the treatment length of 75 cm, delivering 2,701joules over 450seconds. The catheter and sheath were removed and hemostasis easily achieved at the site. Patient was placed in graduated compression stockings and ambulated for 20 minutes without difficulty. Patient tolerated the procedure well, with no immediate complication. IMPRESSION: 1. Technically successful transcatheter laser occlusion of right greater saphenous vein. Patient will followup in clinic in one week.  Patient knows to telephone should there be any interval questions or problems. Electronically Signed   By: Jerilynn Mages.  Cyani Kallstrom M.D.   On: 03/17/2016 10:28    Labs:  CBC:  Recent Labs  06/06/15 0837  WBC 6.7  HGB 15.5  HCT 46.3  PLT 228.0    COAGS: No results for input(s): INR, APTT in the last 8760 hours.  BMP:  Recent Labs  06/06/15 0837  NA 140  K 3.7  CL 103  CO2 30  GLUCOSE 90  BUN 25*  CALCIUM 9.0  CREATININE 0.75    LIVER FUNCTION TESTS:  Recent Labs  06/06/15 0837  BILITOT 1.6*  AST 18  ALT 19  ALKPHOS 49  PROT 6.4  ALBUMIN 4.1     Assessment and Plan:  Doing well one week status post right GSV transcatheter laser occlusion for varicose veins, venous insufficiency, leg pain.  Treated segment is occluded by ultrasound in one week.  Plan: Continue prescription strength compression  stockings, daily walking, and avoiding precipitating factors.  Outpatient follow-up at one month. At that time, reassess the mid and distal right calf residual varicosities and the right small saphenous vein for possible treatment.   Electronically Signed: Greggory Keen 03/26/2016, 10:38 AM   I spent a total of    15 Minutes in face to face in clinical consultation, greater than 50% of which was counseling/coordinating care for varicose veins and venous insufficiency.

## 2016-03-30 ENCOUNTER — Ambulatory Visit (INDEPENDENT_AMBULATORY_CARE_PROVIDER_SITE_OTHER): Payer: 59

## 2016-03-30 DIAGNOSIS — Z23 Encounter for immunization: Secondary | ICD-10-CM

## 2016-04-09 MED FILL — RISEDRONATE NA 150 MG TAB: 150 | 30 days supply | Qty: 1 | Fill #2

## 2016-04-14 ENCOUNTER — Other Ambulatory Visit: Payer: Self-pay | Admitting: Dermatology

## 2016-04-14 DIAGNOSIS — D485 Neoplasm of uncertain behavior of skin: Secondary | ICD-10-CM | POA: Diagnosis not present

## 2016-04-14 DIAGNOSIS — D239 Other benign neoplasm of skin, unspecified: Secondary | ICD-10-CM | POA: Diagnosis not present

## 2016-04-14 DIAGNOSIS — L719 Rosacea, unspecified: Secondary | ICD-10-CM | POA: Diagnosis not present

## 2016-04-14 MED FILL — SOOLANTRA 1% CREAM: 1 | 30 days supply | Qty: 45 | Fill #0

## 2016-04-15 MED FILL — OMEPRAZOLE DR 40 MG CAPSULE: 40 | 30 days supply | Qty: 30 | Fill #2

## 2016-04-21 MED FILL — CYCLOBENZAPRINE 10 MG TAB: 10 | 30 days supply | Qty: 60 | Fill #4

## 2016-04-30 ENCOUNTER — Ambulatory Visit
Admission: RE | Admit: 2016-04-30 | Discharge: 2016-04-30 | Disposition: A | Discharge status: Home / Self Care | Payer: 59 | Source: Ambulatory Visit | Attending: Diagnostic Radiology | Admitting: Diagnostic Radiology

## 2016-04-30 DIAGNOSIS — I8393 Asymptomatic varicose veins of bilateral lower extremities: Secondary | ICD-10-CM

## 2016-04-30 DIAGNOSIS — I872 Venous insufficiency (chronic) (peripheral): Secondary | ICD-10-CM | POA: Diagnosis not present

## 2016-04-30 NOTE — Progress Notes (Signed)
Patient ID: Erik [name], male   DOB: 25-Feb-1956, 60 y.o.   MRN: [id number]       Chief Complaint:  1 month status post right GSV transcatheter laser occlusion for venous insufficiency, leg pain, fatigue, and varicose veins.  Referring Physician(s): Henn,Adam  History of Present Illness: Erik [name] is a 60 y.o. male who is now one month status post right GSV transcatheter laser occlusion for venous insufficiency, leg pain, fatigue, and symptomatic varicose veins. He has recovered at home very well. Mild tenderness along the calf and ankle region on the treated segment has resolved. He has been compliant with compression stockings and daily walking/exercise. No physical limitations. He is back to work. Overall he is recovering very well. He returns today for outpatient follow-up and surveillance ultrasound.  Past Medical History:  Diagnosis Date  . 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 ALLERGIC RHINITIS 12/19/2009  . Back pain chronic    On hydrocodone-Neurontin  . Blepharitis    B, chronic  . BPH (benign prostatic hyperplasia)   . Chronic prostatitis     f/u by urology, has a prostate massage prn  . Groin injury   . HYPOGONADISM, MALE 08/11/2007   f/u by Dr Thomasene Mohair urology    . OSTEOPOROSIS, IDIOPATHIC 08/11/2007   f/u at [facility] , now improved to osteopenia  . Raynaud's disease /phenomenon 08/16/2012  . Rosacea   . Shingles 09-2014  . Varicose veins     Past Surgical History:  Procedure Laterality Date  . BLEPHAROPLASTY     B blepharoplasty @ [facility] 08-2011  . CERVICAL FUSION  1994   C4-C5-C6 fusion  . IR GENERIC HISTORICAL  03/17/2016   IR EMBO VENOUS NOT HEMORR HEMANG  INC GUIDE ROADMAPPING 03/17/2016 Greggory Keen, MD [facility] ULTRASOUND  . TRANSURETHRAL RESECTION OF PROSTATE  07-2009    Allergies: Sulfa drugs cross reactors  Medications: Prior to Admission medications   Medication Sig Start Date End Date Taking? Authorizing Provider  amLODipine (NORVASC)  5 MG tablet Take 5 mg by mouth daily. Prn Reynaud's    Historical Provider, MD  azelastine (ASTELIN) 0.1 % nasal spray 2 sprays each nares q day Patient not taking: Reported on 11/06/2015 08/09/15   Mackie Pai, PA-C  cyclobenzaprine (FLEXERIL) 10 MG tablet Take 1 tablet (10 mg total) by mouth at bedtime as needed for muscle spasms. 05/17/14   Colon Branch, MD  Fish Oil-Cholecalciferol (FISH OIL + D3) 1000-1000 MG-UNIT CAPS Take 1,000 mg by mouth.    Historical Provider, MD  loratadine (CLARITIN) 10 MG tablet Take 10 mg by mouth daily.    Historical Provider, MD  Multiple Vitamin (MULTI-VITAMINS) TABS Take by mouth.    Historical Provider, MD  naproxen (NAPROSYN) 250 MG tablet Take by mouth 2 (two) times daily with a meal. Reported on 08/02/2015    Historical Provider, MD  omeprazole (PRILOSEC) 40 MG capsule TAKE ONE CAPSULE BY MOUTH DAILY 11/01/15   Colon Branch, MD  risedronate (ACTONEL) 150 MG tablet Take 150 mg by mouth every 30 (thirty) days.  01/23/14   Historical Provider, MD  tadalafil (CIALIS) 5 MG tablet Take 5 mg by mouth daily.     Historical Provider, MD  testosterone cypionate (DEPOTESTOTERONE CYPIONATE) 100 MG/ML injection Inject into the muscle every 14 (fourteen) days. For IM use only    Historical Provider, MD     Family History  Problem Relation Age of Onset  . COPD Father     Died, 71  .  Hypertension Father   . Stroke Father   . Diabetes Mother     M, GP  . Hypertension Mother   . CAD Mother     F, M, GP  . Colon polyps Mother   . Cancer Mother     Ovarian cancer  . Hypertension Sister   . Fibromyalgia Sister   . Hyperlipidemia Sister   . Prostate cancer Neg Hx   . Colon cancer Maternal Aunt     Social History   Social History  . Marital status: Married    Spouse name: N/A  . Number of children: 3  . Years of education: N/A   Occupational History  . 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 Smoking status:  Never Smoker  . Smokeless tobacco: Never Used  . Alcohol use No  . Drug use: No  . Sexual activity: Not on file   Other Topics Concern  . Not on file   Social History Narrative   Lives with wife.  They have three children.   Daughter is an Ship broker       Review of Systems: A 12 point ROS discussed and pertinent positives are indicated in the HPI above.  All other systems are negative.  Review of Systems  Vital Signs: There were no vitals taken for this visit.  Physical Exam  Constitutional: He appears well-developed and well-nourished. No distress.  Musculoskeletal:  Right lower extremity GSV treated segment demonstrates no signs of thrombophlebitis or cellulitis. No redness, induration or drainage. Entry site at the ankle is well-healed. Skin intact. No discoloration. Negative for edema.  Skin: He is not diaphoretic.      Imaging: US Venous Img Lower Unilateral Right  Result Date: 04/30/2016 CLINICAL DATA:  Status post right GSV transcatheter laser occlusion for varicose veins, leg pain and venous insufficiency. One month follow-up. EXAM: RIGHT LOWER EXTREMITY VENOUS DOPPLER ULTRASOUND TECHNIQUE: Gray-scale sonography with graded compression, as well as color Doppler and duplex ultrasound were performed to evaluate the lower extremity deep venous systems from the level of the common femoral vein and including the common femoral, femoral, profunda femoral, popliteal and calf veins including the posterior tibial, peroneal and gastrocnemius veins when visible. The superficial great saphenous vein was also interrogated. Spectral Doppler was utilized to evaluate flow at rest and with distal augmentation maneuvers in the common femoral, femoral and popliteal veins. COMPARISON:  None. FINDINGS: Contralateral Common Femoral Vein: Respiratory phasicity is normal and symmetric with the symptomatic side. No evidence of thrombus. Normal compressibility. Common Femoral Vein: No evidence of  thrombus. Normal compressibility, respiratory phasicity and response to augmentation. Saphenofemoral Junction: Treated GSV segment remains occluded up to the saphenous femoral junction. Profunda Femoral Vein: No evidence of thrombus. Normal compressibility and flow on color Doppler imaging. Femoral Vein: No evidence of thrombus. Normal compressibility, respiratory phasicity and response to augmentation. Popliteal Vein: No evidence of thrombus. Normal compressibility, respiratory phasicity and response to augmentation. Calf Veins: No evidence of thrombus. Normal compressibility and flow on color Doppler imaging. Superficial Great Saphenous Vein: Entire right GSV segment remains occluded at 1 month. Right GSV remains noncompressible with a thickened wall. Medial mid and distal calf sub surface varicosities are decompressed the remain patent. No significant interval change. Venous Reflux:  None. Other Findings:  None. IMPRESSION: Negative for DVT. Right GSV treated segment remains occluded at 1 month. Small decompressed sub surface varicosities in the medial mid and distal calf  remain patent. Electronically Signed   By: Jerilynn Mages.  Jaquanda Wickersham M.D.   On: 04/30/2016 09:26    Labs:  CBC:  Recent Labs  06/06/15 0837  WBC 6.7  HGB 15.5  HCT 46.3  PLT 228.0    COAGS: No results for input(s): INR, APTT in the last 8760 hours.  BMP:  Recent Labs  06/06/15 0837  NA 140  K 3.7  CL 103  CO2 30  GLUCOSE 90  BUN 25*  CALCIUM 9.0  CREATININE 0.75    LIVER FUNCTION TESTS:  Recent Labs  06/06/15 0837  BILITOT 1.6*  AST 18  ALT 19  ALKPHOS 49  PROT 6.4  ALBUMIN 4.1      Assessment and Plan:  1 month status post right GS V transcatheter laser occlusion for venous insufficiency and varicose veins. Treated segment remains occluded at 1 month. Overall he is recovering very well. Symptoms are resolving. By ultrasound, there are residual decompressed but patent medial right calf and upper ankle varicose  veins. These would be amenable to ultrasound injection sclerotherapy. After our discussion, the patient would like to hold on any injections until the 6 month evaluation.  Plan: Continue prescription strength compression stockings as much as possible. Daily walking and exercise Follow-up in 6 months with a repeat ultrasound. Reassess calf varicosities for possible sclerotherapy.   Electronically Signed: Greggory Keen 04/30/2016, 9:32 AM   I spent a total of    15 Minutes in face to face in clinical consultation, greater than 50% of which was counseling/coordinating care for this patient with right lower extremity venous insufficiency and varicose veins.

## 2016-05-05 ENCOUNTER — Other Ambulatory Visit: Payer: 59

## 2016-05-05 ENCOUNTER — Ambulatory Visit: Payer: 59

## 2016-05-06 DIAGNOSIS — H40023 Open angle with borderline findings, high risk, bilateral: Secondary | ICD-10-CM | POA: Diagnosis not present

## 2016-05-06 DIAGNOSIS — H353190 Nonexudative age-related macular degeneration, unspecified eye, stage unspecified: Secondary | ICD-10-CM | POA: Diagnosis not present

## 2016-05-07 MED FILL — RISEDRONATE NA 150 MG TAB: 150 | 30 days supply | Qty: 1 | Fill #3

## 2016-05-26 ENCOUNTER — Ambulatory Visit (INDEPENDENT_AMBULATORY_CARE_PROVIDER_SITE_OTHER): Payer: 59 | Admitting: Internal Medicine

## 2016-05-26 ENCOUNTER — Encounter: Payer: Self-pay | Admitting: Internal Medicine

## 2016-05-26 VITALS — BP 108/74 | HR 80 | Temp 97.5°F | Resp 12 | Ht 72.0 in | Wt 205.5 lb

## 2016-05-26 DIAGNOSIS — Z0000 Encounter for general adult medical examination without abnormal findings: Secondary | ICD-10-CM

## 2016-05-26 LAB — LIPID PANEL
CHOL/HDL RATIO: 5
Cholesterol: 186 mg/dL (ref 0–200)
HDL: 39.2 mg/dL (ref >39.00)
LDL CALC: 131 mg/dL — AB (ref 0–99)
NONHDL: 146.34
Triglycerides: 78 mg/dL (ref 0.0–149.0)
VLDL: 15.6 mg/dL (ref 0.0–40.0)

## 2016-05-26 LAB — BASIC METABOLIC PANEL
BUN: 21 mg/dL (ref 6–23)
CHLORIDE: 107 meq/L (ref 96–112)
CO2: 29 meq/L (ref 19–32)
Calcium: 8.9 mg/dL (ref 8.4–10.5)
Creatinine, Ser: 0.82 mg/dL (ref 0.40–1.50)
GFR: 101.14 mL/min (ref >60.00)
GLUCOSE: 107 mg/dL — AB (ref 70–99)
POTASSIUM: 4.4 meq/L (ref 3.5–5.1)
SODIUM: 141 meq/L (ref 135–145)

## 2016-05-26 MED ORDER — AZELASTINE HCL 0.1 % NA SOLN: 2.0000 | Freq: Every day | NASAL | 12 refills | Status: DC

## 2016-05-26 NOTE — Progress Notes (Signed)
Pre visit review using our clinic review tool, if applicable. No additional management support is needed unless otherwise documented below in the visit note. 

## 2016-05-26 NOTE — Patient Instructions (Signed)
GO TO THE LAB : Get the blood work     GO TO THE FRONT DESK Schedule your next appointment for a physical exam in one year, fasting   

## 2016-05-26 NOTE — Progress Notes (Signed)
Subjective:    Patient ID: Erik [name], male    DOB: 29-Jul-1955, 60 y.o.   MRN: [id number]  DOS:  05/26/2016 Type of visit - description : cpx Interval history:No major concerns. Good medication compliance, see multiple other physicians. Chart reviewed.    Review of Systems Constitutional: No fever. No chills. No unexplained wt changes. No unusual sweats  HEENT: Chronic postnasal dripping, not well controlled on loratadine. No dental problems, no ear discharge, no facial swelling, no voice changes. No eye discharge, no eye  redness , no  intolerance to light   Respiratory: No wheezing , no  difficulty breathing. No cough , no mucus production  Cardiovascular: No CP, no leg swelling , no  Palpitations  GI: no nausea, no vomiting, no diarrhea , no  abdominal pain.  No blood in the stools. No dysphagia, no odynophagia    Endocrine: No polyphagia, no polyuria , no polydipsia  GU: Chronic GU symptoms, managed by urology  Musculoskeletal: No joint swellings or unusual aches or pains  Skin: No change in the color of the skin, palor , no  Rash  Allergic, immunologic: No environmental allergies , no  food allergies  Neurological: No dizziness no  syncope. No headaches. No diplopia, no slurred, no slurred speech, no motor deficits, no facial  Numbness  Hematological: No enlarged lymph nodes, no easy bruising , no unusual bleedings  Psychiatry: No suicidal ideas, no hallucinations, no beavior problems, no confusion.  No unusual/severe anxiety, no depression   Past Medical History:  Diagnosis Date  . 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 ALLERGIC RHINITIS 12/19/2009  . Back pain chronic    On hydrocodone-Neurontin  . Blepharitis    B, chronic  . BPH (benign prostatic hyperplasia)   . Chronic prostatitis     f/u by urology, has a prostate massage prn  . Groin injury   . HYPOGONADISM, MALE 08/11/2007   f/u by Dr Thomasene Mohair urology    . OSTEOPOROSIS, IDIOPATHIC  08/11/2007   f/u at [facility] , now improved to osteopenia  . Raynaud's disease /phenomenon 08/16/2012  . Rosacea   . Shingles 09-2014  . Varicose veins     Past Surgical History:  Procedure Laterality Date  . BLEPHAROPLASTY     B blepharoplasty @ [facility] 08-2011  . CERVICAL FUSION  1994   C4-C5-C6 fusion  . IR GENERIC HISTORICAL  03/17/2016   IR EMBO VENOUS NOT HEMORR HEMANG  INC GUIDE ROADMAPPING 03/17/2016 Greggory Keen, MD [facility] ULTRASOUND  . TRANSURETHRAL RESECTION OF PROSTATE  07-2009    Social History   Social History  . Marital status: Married    Spouse name: N/A  . Number of children: 3  . Years of education: N/A   Occupational History  . software trainer      Social History Main Topics  . Smoking status: Never Smoker  . Smokeless tobacco: Never Used  . Alcohol use No  . Drug use: No  . Sexual activity: Not on file   Other Topics Concern  . Not on file   Social History Narrative   Lives with wife.  They have three children.   Daughter is an Ship broker       Family History  Problem Relation Age of Onset  . COPD Father     Died, 71  . Hypertension Father   . Stroke Father   . CAD Father   . Diabetes Mother   . Hypertension Mother   . CAD  Mother        . Colon polyps Mother   . Cancer Mother     Ovarian cancer  . Hypertension Sister   . Fibromyalgia Sister   . Hyperlipidemia Sister   . Colon cancer Maternal Aunt   . Prostate cancer Neg Hx        Medication List       Accurate as of 05/26/16 11:59 PM. Always use your most recent med list.          amLODipine 5 MG tablet Commonly known as:  NORVASC Take 5 mg by mouth daily. Prn Reynaud's   azelastine 0.1 % nasal spray Commonly known as:  ASTELIN Place 2 sprays into both nostrils daily.   FISH OIL + D3 1000-1000 MG-UNIT Caps Take 1,000 mg by mouth.   loratadine 10 MG tablet Commonly known as:  CLARITIN Take 10 mg by mouth daily.   MULTI-VITAMINS Tabs Take by mouth.   naproxen 250 MG  tablet Commonly known as:  NAPROSYN Take by mouth 2 (two) times daily with a meal. Reported on 08/02/2015   omeprazole 40 MG capsule Commonly known as:  PRILOSEC TAKE ONE CAPSULE BY MOUTH DAILY   risedronate 150 MG tablet Commonly known as:  ACTONEL Take 150 mg by mouth every 30 (thirty) days.   tadalafil 5 MG tablet Commonly known as:  CIALIS Take 5 mg by mouth daily.   testosterone cypionate 100 MG/ML injection Commonly known as:  DEPOTESTOTERONE CYPIONATE Inject into the muscle every 14 (fourteen) days. For IM use only          Objective:   Physical Exam BP 108/74 (BP Location: Left Arm, Patient Position: Sitting, Cuff Size: Normal)   Pulse 80   Temp 97.5 F (36.4 C) (Oral)   Resp 12   Ht 6' (1.829 m)   Wt 205 lb 8 oz (93.2 kg)   SpO2 95%   BMI 27.87 kg/m   General:   Well developed, well nourished . NAD.  Neck: No  thyromegaly  HEENT:  Normocephalic . Face symmetric, atraumatic Lungs:  CTA B Normal respiratory effort, no intercostal retractions, no accessory muscle use. Heart: RRR,  no murmur.  Has compression stockings, no edema Abdomen:  Not distended, soft, non-tender. No rebound or rigidity.   Skin: Exposed areas without rash. Not pale. Not jaundice Neurologic:  alert & oriented X3.  Speech normal, gait appropriate for age and unassisted Strength symmetric and appropriate for age.  Psych: Cognition and judgment appear intact.  Cooperative with normal attention span and concentration.  Behavior appropriate. No anxious or depressed appearing.    Assessment & Plan:   Assessment   Osteoporosis, idiopathic, follow-up at [facility]. Raynaud phenomena 2013--Dr Truslow, amlodipine prn [facility] -- s/p Cervical fusion --Chronic back pain: Hydrocodone rx by pcp  (used to be on gabapentin ) GU: Dr. Thomasene Mohair, urology --BPH, TURP 2011 --Chronic prostatitis --Hypogonadism, per urology Rosacea Varicose veins treatment @ [place] [facility] OPHT: Glaucoma -Chronic blepharitis-  ocular rosacea- Mac degeneration H/o Shingles 09-2014     PLAN Here for a CPX, doing well. Sees multiple doctors for chronic medical issues, all seem well controlled. Was seen with left groin pain: Currently mild and on and off, saw urology, felt that he has a hernia and Rx  observation Lump at the right leg: see last OV. 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 Call if not better Varicose veins: Status post multiple treatments by  radiology. Stable. RTC one year

## 2016-05-26 NOTE — Assessment & Plan Note (Addendum)
Td 2013; Zostavax -- 2014; had a flu shot  Colonoscopy Dr Waverly Ferrari, first Index 2004 (polyp) , then 01/02/2008 (tubular adenoma), 10-2015, 1 polyp, 5 years  PSA per urology Diet, exercise-- discussed, room for improvement, tips provided. Labs: FLP, BMP

## 2016-05-27 NOTE — Assessment & Plan Note (Signed)
Here for a CPX, doing well. Sees multiple doctors for chronic medical issues, all seem well controlled. Was seen with left groin pain: Currently mild and on and off, saw urology, felt that he has a hernia and Rx  observation Lump at the right leg: see last OV. 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 Call if not better Varicose veins: Status post multiple treatments by radiology. Stable. RTC one year

## 2016-06-04 MED FILL — AZELASTINE 0.1% (137 MCG) S: 0.1 | 30 days supply | Qty: 30 | Fill #0

## 2016-06-04 MED FILL — RISEDRONATE NA 150 MG TAB: 150 | 30 days supply | Qty: 1 | Fill #4

## 2016-06-05 DIAGNOSIS — E291 Testicular hypofunction: Secondary | ICD-10-CM | POA: Diagnosis not present

## 2016-06-06 IMAGING — MR MR SHOULDER*L* W/O CM
4 of 5 series · 19 of 40 positions shown · non-contrast
Comparison: 04/29/2015

CLINICAL DATA: Left shoulder pain with popping and clicking.
Difficulty lying flat. Reduced range of motion.

EXAM:
MRI OF THE LEFT SHOULDER WITHOUT CONTRAST
TECHNIQUE: Multiplanar, multisequence MR imaging of the shoulder was performed.
No intravenous contrast was administered.

[Series 3: T2 fat-sat · axial · 4.0mm · 0.23mm/px · z∈[-51,+39]mm · 5 of 23 slices shown (1 of 3)]
[im 1/23]
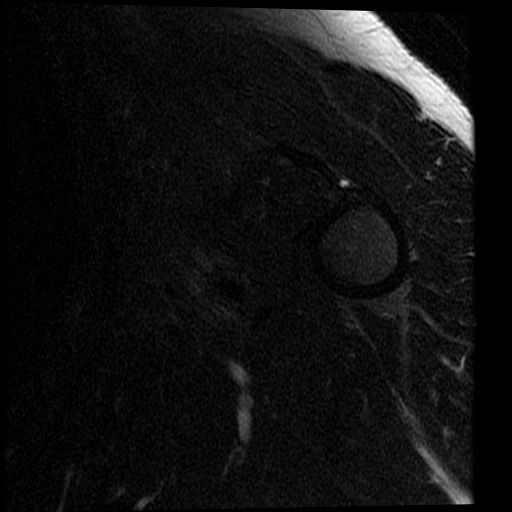
[im 4/23]
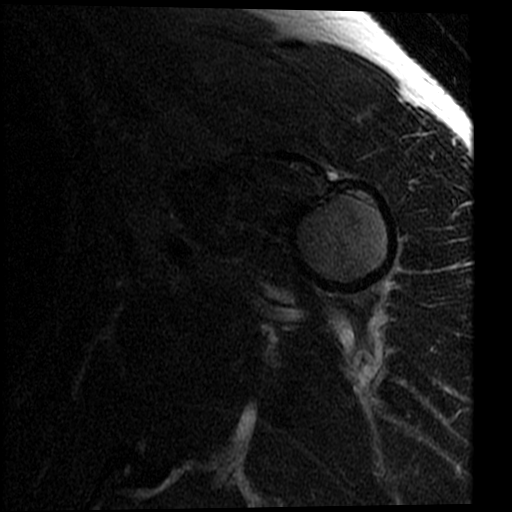
[im 7/23]
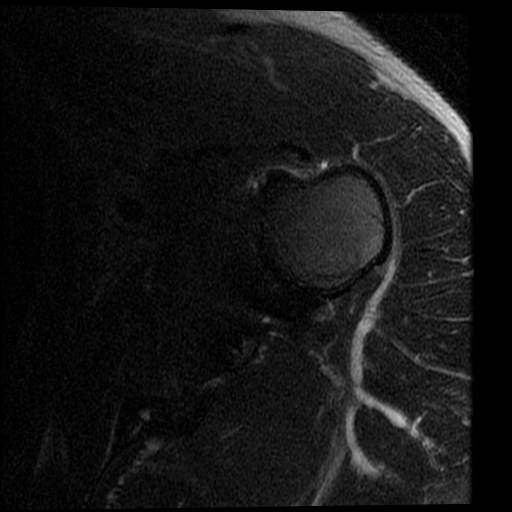
[im 13/23]
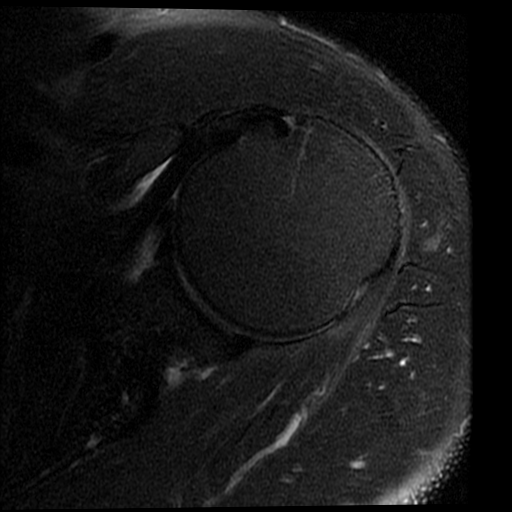
[im 19/23]
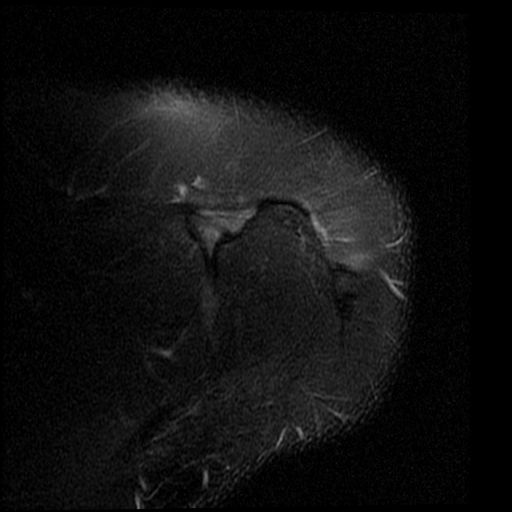

[Series 4: T2 fat-sat · oblique · 4.0mm · 0.29mm/px · 3 of 19 slices shown (2 of 3)]
[im 3/19]
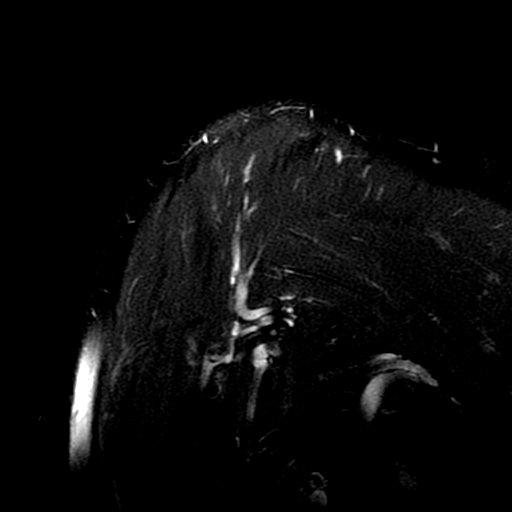
[im 11/19]
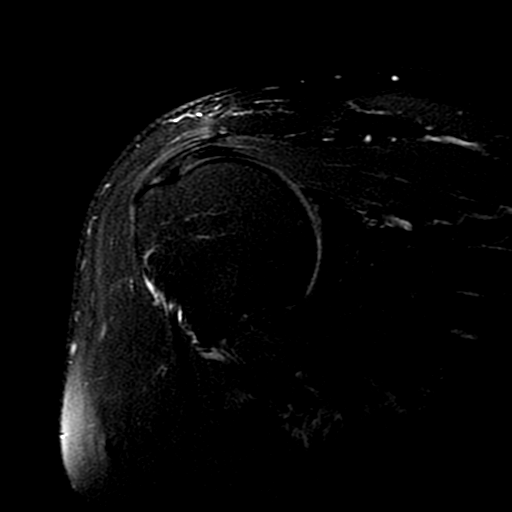
[im 16/19]
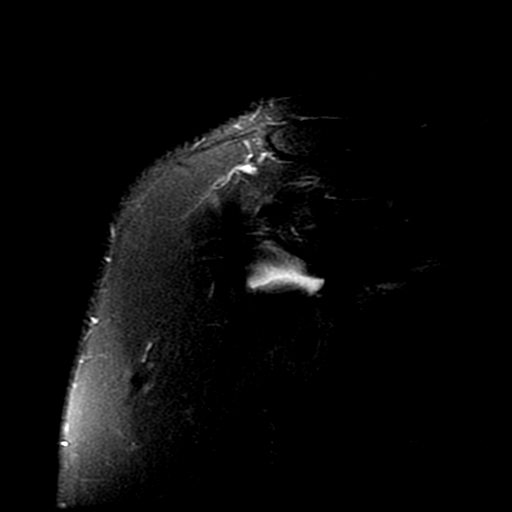

[Series 5: PD · oblique · 4.0mm · 0.29mm/px · 8 of 19 slices shown]
[im 1/19]
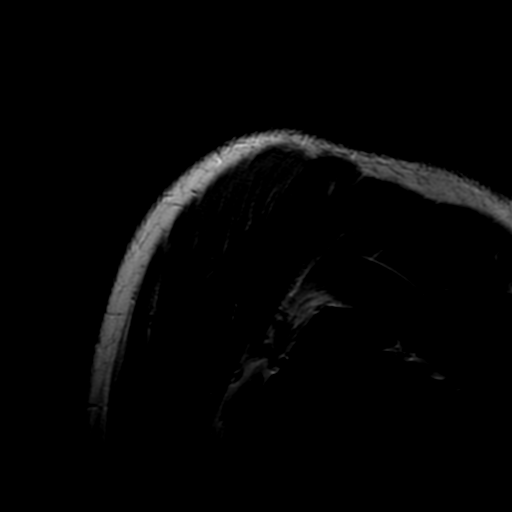
[im 3/19]
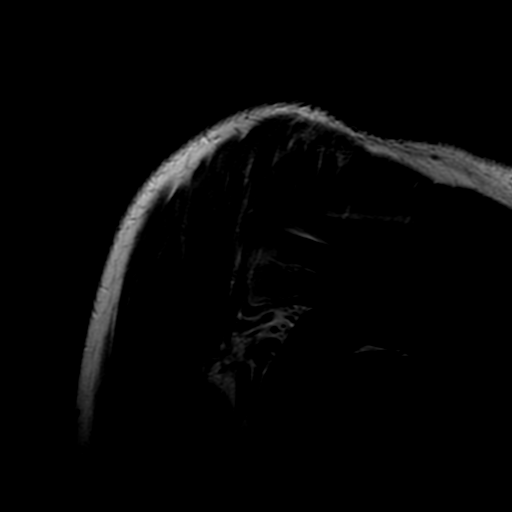
[im 6/19]
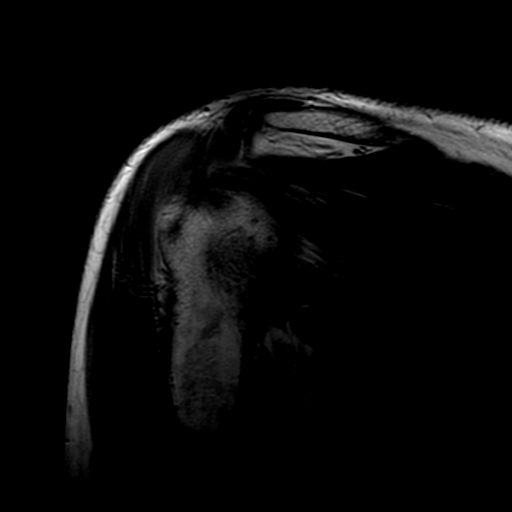
[im 8/19]
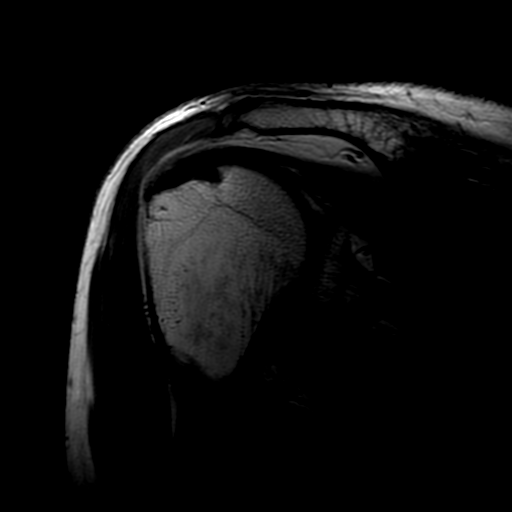
[im 11/19]
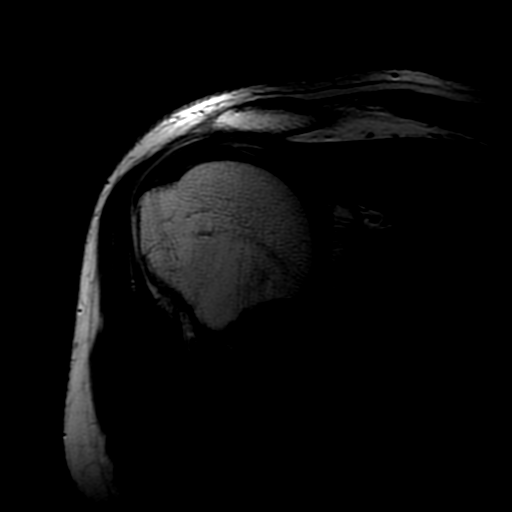
[im 13/19]
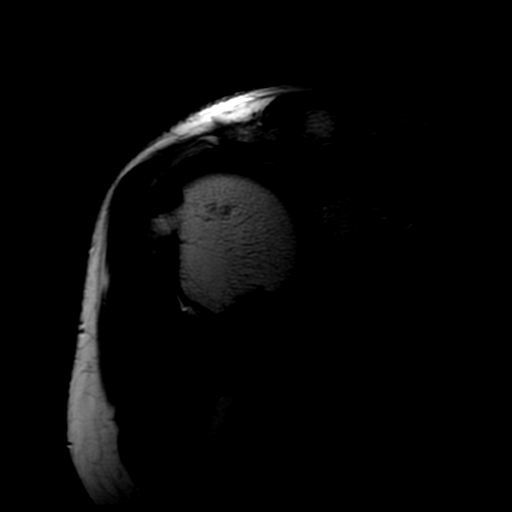
[im 16/19]
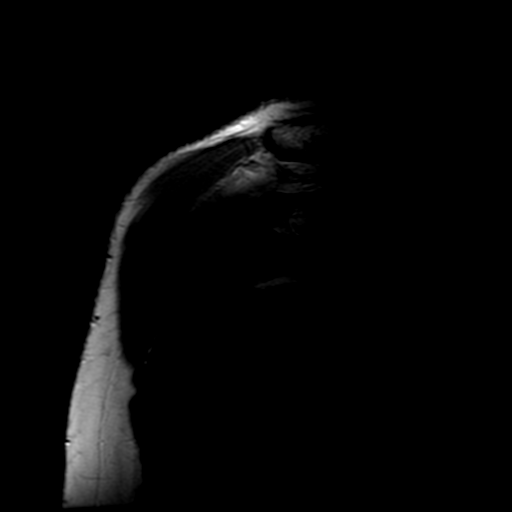
[im 19/19]
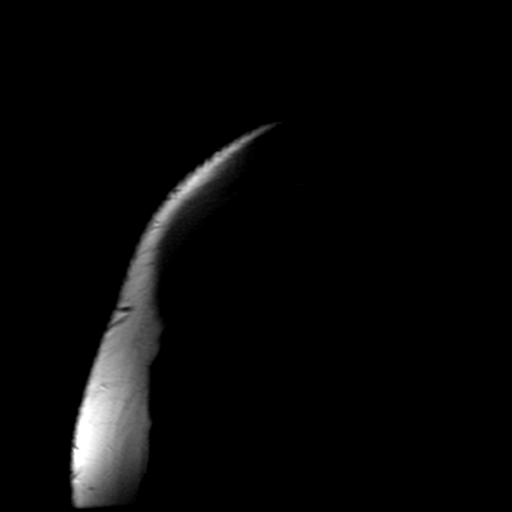

[Series 7: T2 fat-sat · oblique · 4.0mm · 0.29mm/px · 3 of 19 slices shown (3 of 3)]
[im 3/19]
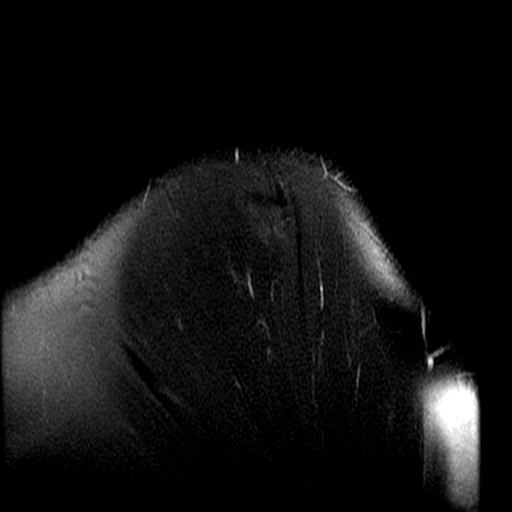
[im 11/19]
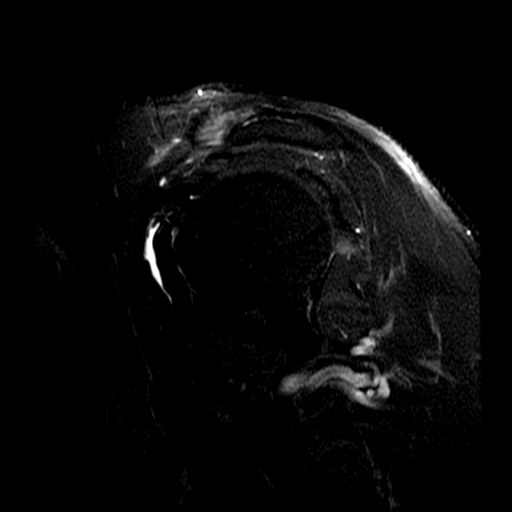
[im 16/19]
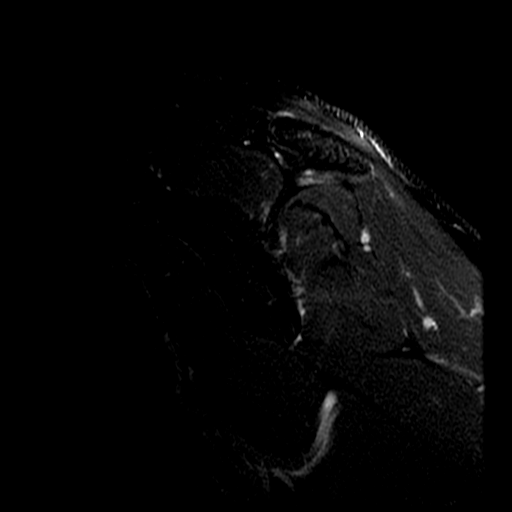

[19 of 40 positions shown; findings below may reference images not displayed]

FINDINGS: Rotator cuff: Mild supraspinatus, infraspinatus, and subscapularis
tendinopathy.

Muscles: Longitudinal band of edema in the inferior margin of the
infraspinatus muscle. There is also a longitudinal band of edema
tracking in the posterolateral deltoid muscle on images 5 through 13
series 3.

Biceps long head:  Unremarkable

Acromioclavicular Joint: No significant AC joint arthropathy.
Subacromial morphology is type 2 (curved). Mild subcoracoid
bursitis, image 9 series 7.

Glenohumeral Joint: Mild degenerative glenohumeral articular
cartilage thinning. No joint effusion.

Labrum: No labral tear identified. Faint degenerative signal in the
superior labrum.

Bones: No significant extra-articular osseous abnormalities
identified.
IMPRESSION: 1. Longitudinal bands of edema signal in the inferior margin of the
infraspinatus muscle and the posterolateral deltoid muscle, likely
reflecting prior muscle strains.
2. Mild supraspinatus, infraspinatus, and subscapularis tendinopathy
without rotator cuff tear identified.
3. Mild subcoracoid bursitis. In a significant percentage of cases,
the subcoracoid bursa communicates with the subacromial subdeltoid
bursa, but on today's exam there is no significant degree of fluid
in the subacromial subdeltoid bursa.
4. Mild degenerative glenohumeral articular cartilage thinning. AC
joint unremarkable.

## 2016-06-07 NOTE — Progress Notes (Signed)
[name] [address], [place] [address] Phone: [phone] Subjective:    I'm seeing this patient by the request  of:  Kathlene November, MD   CC: Neck pain  [id number]  Erik [name] is a 60 y.o. male coming in with complaint of neck pain. Patient's past medical history denies significant for cervical fusion. Patient does have known chronic back pain and is taking hydrocodone as prescribed by another provider. Patient states Having worsening discomfort. Having radiation down the arms bilaterally. Seems worse going all way to the pinky finger and the ring finger bilaterally. Patient denies any weakness at this time. Does feel fairly similar to what he had prior to his fusion of his neck greater than 20 years ago. Patient has tried ice and over-the-counter medications that do help but never take the pain completely away. Waking him up at night. Rates the severity of pain a 7 out of 10 and worsening.  Also complaining of some mild lower back pain. An states that it is more of a tightness. No radiation of pain or numbness. Patient rates the severity of pain a 5 out of 10. Patient to make sure he does not have very similar arthritic changes of the back. Past medical history significant also for osteopenia. Patient is on Actonel and testosterone.    Past Medical History:  Diagnosis Date  . 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 ALLERGIC RHINITIS 12/19/2009  . Back pain chronic    On hydrocodone-Neurontin  . Blepharitis    B, chronic  . BPH (benign prostatic hyperplasia)   . Chronic prostatitis     f/u by urology, has a prostate massage prn  . Groin injury   . HYPOGONADISM, MALE 08/11/2007   f/u by Dr Thomasene Mohair urology    . OSTEOPOROSIS, IDIOPATHIC 08/11/2007   f/u at [facility] , now improved to osteopenia  . Raynaud's disease /phenomenon 08/16/2012  . Rosacea   . Shingles 09-2014  . Varicose veins    Past Surgical History:  Procedure Laterality  Date  . BLEPHAROPLASTY     B blepharoplasty @ [facility] 08-2011  . CERVICAL FUSION  1994   C4-C5-C6 fusion  . IR GENERIC HISTORICAL  03/17/2016   IR EMBO VENOUS NOT HEMORR HEMANG  INC GUIDE ROADMAPPING 03/17/2016 Greggory Keen, MD [facility]TR[facility]  . TRANSURETHRAL RESECTION OF PROSTATE  07-2009   Social History   Social History  . Marital status: Married    Spouse name: N/A  . Number of children: 3  . Years of education: N/A   Occupational History  . software trainer      Social History Main Topics  . Smoking status: Never Smoker  . Smokeless tobacco: Never Used  . Alcohol use No  . Drug use: No  . Sexual activity: Not Asked   Other Topics Concern  . None   Social History Narrative   Lives with wife.  They have three children.   Daughter is an Ship broker     Allergies  Allergen Reactions  . Sulfa Drugs Cross Reactors Nausea And Vomiting   Family History  Problem Relation Age of Onset  . COPD Father     Died, 71  . Hypertension Father   . Stroke Father   . CAD Father   . Diabetes Mother   . Hypertension Mother   . CAD Mother        . Colon polyps Mother   . Cancer Mother  Ovarian cancer  . Hypertension Sister   . Fibromyalgia Sister   . Hyperlipidemia Sister   . Colon cancer Maternal Aunt   . Prostate cancer Neg Hx     Past medical history, social, surgical and family history all reviewed in electronic medical record.  No pertanent information unless stated regarding to the chief complaint.   Review of Systems:Review of systems updated and as accurate as of 06/08/16  No headache, visual changes, nausea, vomiting, diarrhea, constipation, dizziness, abdominal pain, skin rash, fevers, chills, night sweats, weight loss, swollen lymph nodes, body aches, joint swelling, muscle aches, chest pain, shortness of breath, mood changes.   Objective  Blood pressure 122/80, pulse 77, height 6' (1.829 m), weight 203 lb (92.1 kg), SpO2 98 %. Systems examined below as of  06/08/16   General: No apparent distress alert and oriented x3 mood and affect normal, dressed appropriately.  HEENT: Pupils equal, extraocular movements intact  Respiratory: Patient's speak in full sentences and does not appear short of breath  Cardiovascular: No lower extremity edema, non tender, no erythema  Skin: Warm dry intact with no signs of infection or rash on extremities or on axial skeleton.  Abdomen: Soft nontender  Neuro: Cranial nerves II through XII are intact, neurovascularly intact in all extremities with 2+ DTRs and 2+ pulses.  Lymph: No lymphadenopathy of posterior or anterior cervical chain or axillae bilaterally.  Gait normal with good balance and coordination.  MSK:  Non tender with full range of motion and good stability and symmetric strength and tone of shoulders, elbows, wrist, hip, knee and ankles bilaterally.  Neck: Inspection loss of lordosis No palpable stepoffs. Positive Spurling's maneuver. Radicular symptoms going in the C8 distribution bilaterally Limited range of motion lacking the last 10 of forward flexion in the last 15 of extension. Grip strength and sensation normal in bilateral hands Strength good C4 to T1 distribution No sensory change to C4 to T1 Negative Hoffman sign bilaterally Reflexes normal  Back Exam:  Inspection: Unremarkable  Motion: Flexion 40 deg, Extension 25 deg, Side Bending to 35 deg bilaterally,  Rotation to 35 deg bilaterally  SLR laying: Negative  XSLR laying: Negative  Palpable tenderness: Mild tenderness to palpation and appears palmar musculature L5-S1 bilaterally. FABER: negative. Tightness but no significant pain Sensory change: Gross sensation intact to all lumbar and sacral dermatomes.  Reflexes: 2+ at both patellar tendons, 2+ at achilles tendons, Babinski's downgoing.  Strength at foot  Plantar-flexion: 5/5 Dorsi-flexion: 5/5 Eversion: 5/5 Inversion: 5/5  Leg strength  Quad: 5/5 Hamstring: 5/5 Hip flexor: 5/5  Hip abductors: 4/5 but symmetric Gait unremarkable.  Osteopathic findings T5 extended rotated and side bent right L2 flexed rotated and side bent left Sacrum left on left  Procedure note 97110; 15 minutes spent for Therapeutic exercises as stated in above notes.  This included exercises focusing on stretching, strengthening, with significant focus on eccentric aspects.  Basic scapular stabilization to include adduction and depression of scapula Scaption, focusing on proper movement and good control Rows with theraband  Low back exercises that included:  Pelvic tilt/bracing instruction to focus on control of the pelvic girdle and lower abdominal muscles  Glute strengthening exercises, focusing on proper firing of the glutes without engaging the low back muscles Proper stretching techniques for maximum relief for the hamstrings, hip flexors, low back and some rotation where tolerated     Proper technique shown and discussed handout in great detail with ATC.  All questions were discussed and answered.  Impression and Recommendations:     This case required medical decision making of moderate complexity.      Note: This dictation was prepared with Dragon dictation along with smaller phrase technology. Any transcriptional errors that result from this process are unintentional.

## 2016-06-08 ENCOUNTER — Encounter: Payer: Self-pay | Admitting: Family Medicine

## 2016-06-08 ENCOUNTER — Ambulatory Visit (INDEPENDENT_AMBULATORY_CARE_PROVIDER_SITE_OTHER): Payer: 59 | Admitting: Family Medicine

## 2016-06-08 ENCOUNTER — Ambulatory Visit (INDEPENDENT_AMBULATORY_CARE_PROVIDER_SITE_OTHER)
Admission: RE | Admit: 2016-06-08 | Discharge: 2016-06-08 | Disposition: A | Discharge status: Home / Self Care | Payer: 59 | Source: Ambulatory Visit | Attending: Family Medicine | Admitting: Family Medicine

## 2016-06-08 VITALS — BP 122/80 | HR 77 | Ht 72.0 in | Wt 203.0 lb

## 2016-06-08 DIAGNOSIS — M999 Biomechanical lesion, unspecified: Secondary | ICD-10-CM

## 2016-06-08 DIAGNOSIS — M5416 Radiculopathy, lumbar region: Secondary | ICD-10-CM

## 2016-06-08 DIAGNOSIS — M5136 Other intervertebral disc degeneration, lumbar region: Secondary | ICD-10-CM | POA: Insufficient documentation

## 2016-06-08 DIAGNOSIS — M545 Low back pain: Secondary | ICD-10-CM | POA: Diagnosis not present

## 2016-06-08 DIAGNOSIS — M8580 Other specified disorders of bone density and structure, unspecified site: Secondary | ICD-10-CM

## 2016-06-08 DIAGNOSIS — M542 Cervicalgia: Secondary | ICD-10-CM | POA: Diagnosis not present

## 2016-06-08 DIAGNOSIS — M4322 Fusion of spine, cervical region: Secondary | ICD-10-CM | POA: Diagnosis not present

## 2016-06-08 DIAGNOSIS — M5030 Other cervical disc degeneration, unspecified cervical region: Secondary | ICD-10-CM | POA: Diagnosis not present

## 2016-06-08 MED ORDER — GABAPENTIN 100 MG PO CAPS: 200.0000 mg | ORAL_CAPSULE | Freq: Every day | ORAL | 3 refills | Status: DC

## 2016-06-08 MED ORDER — PREDNISONE 50 MG PO TABS: 50.0000 mg | ORAL_TABLET | Freq: Every day | ORAL | 0 refills | Status: DC

## 2016-06-08 MED ORDER — VITAMIN D (ERGOCALCIFEROL) 1.25 MG (50000 UNIT) PO CAPS: 50000.0000 [IU] | ORAL_CAPSULE | ORAL | 0 refills | Status: DC

## 2016-06-08 MED FILL — GABAPENTIN 100 MG CAPSULE: 100 | 30 days supply | Qty: 60 | Fill #0

## 2016-06-08 MED FILL — predniSONE 50 MG TABS: 50 | 5 days supply | Qty: 5 | Fill #0

## 2016-06-08 MED FILL — VIT D2 1.25 MG (50,000 UNIT: 1.25 MG | 84 days supply | Qty: 12 | Fill #0

## 2016-06-08 NOTE — Assessment & Plan Note (Signed)
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 X-rays are pending. We discussed red zone, gabapentin, once weekly vitamin D. Patient work with Product/process development scientist as well. Patient will continue to stay active. We discussed avoiding certain lifting mechanics. We also discussed avoiding manipulation of the neck. Patient will come back and see me again in 2 weeks.

## 2016-06-08 NOTE — Patient Instructions (Addendum)
Good to see you.  Ice 20 minutes 2 times daily. Usually after activity and before bed. Exercises 3 times a week.  pennsaid pinkie amount topically 2 times daily as needed.  Prednsione daily for 5 days Gabapentin 200mg  daily  Once weekly vitamin D for next 12 weeks Avoid heavy lifting if you can.  Keep hands within peripheral vision Xrays downstairs today  Attempted some manipulation of your back and lower back.  See me again in 2-3 weeks Happy holidays!

## 2016-06-08 NOTE — Assessment & Plan Note (Signed)
Started on once weekly vitamin D

## 2016-06-08 NOTE — Assessment & Plan Note (Signed)
Decision today to treat with OMT was based on Physical Exam  After verbal consent patient was treated with HVLA, ME techniques in thoracic, lumbar and sacral areas  Patient tolerated the procedure well with improvement in symptoms  Patient given exercises, stretches and lifestyle modifications  See medications in patient instructions if given  Patient will follow up in 3 weeks      

## 2016-06-08 NOTE — Assessment & Plan Note (Signed)
I do believe the patient's limited range of motion of the back and patient's history of cervical osteophytic changes patient will likely have arthritic changes of the lumbar spine. Pictures are pending. We discussed icing regimen and home exercises. We discussed which activities to do a which was to avoid. Patient work with Product/process development scientist. No radicular symptoms at this time. Follow-up again in 3 weeks.

## 2016-06-17 DIAGNOSIS — H524 Presbyopia: Secondary | ICD-10-CM | POA: Diagnosis not present

## 2016-06-17 DIAGNOSIS — H5213 Myopia, bilateral: Secondary | ICD-10-CM | POA: Diagnosis not present

## 2016-06-17 DIAGNOSIS — H353121 Nonexudative age-related macular degeneration, left eye, early dry stage: Secondary | ICD-10-CM | POA: Diagnosis not present

## 2016-06-17 DIAGNOSIS — H04123 Dry eye syndrome of bilateral lacrimal glands: Secondary | ICD-10-CM | POA: Diagnosis not present

## 2016-06-17 DIAGNOSIS — H52223 Regular astigmatism, bilateral: Secondary | ICD-10-CM | POA: Diagnosis not present

## 2016-06-17 DIAGNOSIS — H353112 Nonexudative age-related macular degeneration, right eye, intermediate dry stage: Secondary | ICD-10-CM | POA: Diagnosis not present

## 2016-06-17 DIAGNOSIS — H2513 Age-related nuclear cataract, bilateral: Secondary | ICD-10-CM | POA: Diagnosis not present

## 2016-06-19 DIAGNOSIS — N400 Enlarged prostate without lower urinary tract symptoms: Secondary | ICD-10-CM | POA: Diagnosis not present

## 2016-06-19 DIAGNOSIS — N411 Chronic prostatitis: Secondary | ICD-10-CM | POA: Diagnosis not present

## 2016-06-22 NOTE — Progress Notes (Signed)
[name] [address], [place] [address] Phone: [phone] Subjective:    I'm seeing this patient by the request  of:  Kathlene November, MD   CC: Neck pain f/u Low back pain follow-up  [id number]  Erik [name] is a 60 y.o. male coming in with complaint of neck pain. Patient's past medical history denies significant for cervical fusion. Patient does have known chronic back pain and is taking hydrocodone as prescribed by another provider.Radiation going down the arm. 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 Patient was started on prednisone, gabapentin and once weekly vitamin D. Patient states was doing much better on the prednisone but unfortunately right after he got off the prednisone thinking back. Radicular symptom seems to be more constant at this point. Still more in the C7 distribution. No weakness.  Also complaining of some mild lower back pain. Done have severe arthritic changes of the lower back. Patient states that he did have epidurals multiple years ago and did not have any significant benefit. Not doing well with the exercises. Gabapentin may have helped a little with sleep but not helping with any of the daytime discomfort.    Past Medical History:  Diagnosis Date  . 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 ALLERGIC RHINITIS 12/19/2009  . Back pain chronic    On hydrocodone-Neurontin  . Blepharitis    B, chronic  . BPH (benign prostatic hyperplasia)   . Chronic prostatitis     f/u by urology, has a prostate massage prn  . Groin injury   . HYPOGONADISM, MALE 08/11/2007   f/u by Dr Thomasene Mohair urology    . OSTEOPOROSIS, IDIOPATHIC 08/11/2007   f/u at [facility] , now improved to osteopenia  . Raynaud's disease /phenomenon 08/16/2012  . Rosacea   . Shingles 09-2014  . Varicose veins    Past Surgical History:  Procedure Laterality Date  . BLEPHAROPLASTY     B blepharoplasty @ [facility]  08-2011  . CERVICAL FUSION  1994   C4-C5-C6 fusion  . IR GENERIC HISTORICAL  03/17/2016   IR EMBO VENOUS NOT HEMORR HEMANG  INC GUIDE ROADMAPPING 03/17/2016 Greggory Keen, MD [facility]  . TRANSURETHRAL RESECTION OF PROSTATE  07-2009   Social History   Social History  . Marital status: Married    Spouse name: N/A  . Number of children: 3  . Years of education: N/A   Occupational History  . software trainer      Social History Main Topics  . Smoking status: Never Smoker  . Smokeless tobacco: Never Used  . Alcohol use No  . Drug use: No  . Sexual activity: Not Asked   Other Topics Concern  . None   Social History Narrative   Lives with wife.  They have three children.   Daughter is an Ship broker     Allergies  Allergen Reactions  . Sulfa Drugs Cross Reactors Nausea And Vomiting   Family History  Problem Relation Age of Onset  . COPD Father     Died, 71  . Hypertension Father   . Stroke Father   . CAD Father   . Diabetes Mother   . Hypertension Mother   . CAD Mother        . Colon polyps Mother   . Cancer Mother     Ovarian cancer  . Hypertension Sister   . Fibromyalgia Sister   . Hyperlipidemia Sister   .  Colon cancer Maternal Aunt   . Prostate cancer Neg Hx     Past medical history, social, surgical and family history all reviewed in electronic medical record.  No pertanent information unless stated regarding to the chief complaint.   Review of Systems: No headache, visual changes, nausea, vomiting, diarrhea, constipation, dizziness, abdominal pain, skin rash, fevers, chills, night sweats, weight loss, swollen lymph nodes, chest pain, shortness of breath, mood changes.    Objective  Blood pressure 130/86, pulse 87, height 6' (1.829 m), weight 207 lb (93.9 kg), SpO2 97 %.   Systems examined below as of 06/23/16 General: NAD A&O x3 mood, affect normal  HEENT: Pupils equal, extraocular movements intact no nystagmus Respiratory: not short of breath  at rest or with speaking Cardiovascular: No lower extremity edema, non tender Skin: Warm dry intact with no signs of infection or rash on extremities or on axial skeleton. Abdomen: Soft nontender, no masses Neuro: Cranial nerves  intact, neurovascularly intact in all extremities with 2+ DTRs and 2+ pulses. Lymph: No lymphadenopathy appreciated today  Gait normal with good balance and coordination.  MSK: Non tender with full range of motion and good stability and symmetric strength and tone of shoulders, elbows, wrist,  knee hips and ankles bilaterally.  Mild arthritic changes of multiple joints Neck: Inspection loss of lordosis No palpable stepoffs. Positive Spurling's maneuver. Radicular symptoms going in the C7-8 distribution bilaterally worse on the left side. Limited range of motion lacking the last 10 of forward flexion in the last 15 of extension. Stable from previous exam Grip strength and sensation normal in bilateral hands Mild weakness in the C8 distribution. No sensory change to C4 to T1 Negative Hoffman sign bilaterally Reflexes normal Worsening or improvement from previous exam  Back Exam:  Inspection: Unremarkable  Motion: Flexion 35 deg, Extension 25 deg, Side Bending to 35 deg bilaterally,  Rotation to 35 deg bilaterally mild increase in stiffness with flexion today SLR laying: Positive right side XSLR laying: Negative  Palpable tenderness: Worsening discomfort in the paraspinal musculature of the lumbar spine mostly from L4-S1 bilaterally FABER: negative. Tightness but no significant pain Sensory change: Gross sensation intact to all lumbar and sacral dermatomes.  Reflexes: 2+ at both patellar tendons, 2+ at achilles tendons, Babinski's downgoing.  Strength at foot  4 out of 5 strength with plantar flexion compared to the contralateral side.     Impression and Recommendations:     This case required medical decision making of moderate complexity.       Note: This dictation was prepared with Dragon dictation along with smaller phrase technology. Any transcriptional errors that result from this process are unintentional.

## 2016-06-23 ENCOUNTER — Encounter: Payer: Self-pay | Admitting: Family Medicine

## 2016-06-23 ENCOUNTER — Ambulatory Visit (INDEPENDENT_AMBULATORY_CARE_PROVIDER_SITE_OTHER): Payer: 59 | Admitting: Family Medicine

## 2016-06-23 VITALS — BP 130/86 | HR 87 | Ht 72.0 in | Wt 207.0 lb

## 2016-06-23 DIAGNOSIS — M5136 Other intervertebral disc degeneration, lumbar region: Secondary | ICD-10-CM

## 2016-06-23 DIAGNOSIS — M5030 Other cervical disc degeneration, unspecified cervical region: Secondary | ICD-10-CM

## 2016-06-23 MED ORDER — ALLOPURINOL 100 MG PO TABS: 100.0000 mg | ORAL_TABLET | Freq: Every day | ORAL | 6 refills | Status: DC

## 2016-06-23 MED ORDER — GABAPENTIN 300 MG PO CAPS: 300.0000 mg | ORAL_CAPSULE | Freq: Every day | ORAL | 3 refills | Status: DC

## 2016-06-23 MED FILL — GABAPENTIN 300 MG CAPSULE: 300 | 30 days supply | Qty: 30 | Fill #0

## 2016-06-23 MED FILL — ALLOPURINOL 100 MG TABLET: 100 | 30 days supply | Qty: 30 | Fill #0

## 2016-06-23 NOTE — Patient Instructions (Signed)
Go to see you  Allopurinol 100mg  daily to see if any crystal arthropathy is playing a role.  Increased gabapentin to 300mg  at night We will get MRI of neck and lumbar will be ordered and we will discuss Happy holidays!  We will talk and discuss follow up

## 2016-06-23 NOTE — Assessment & Plan Note (Signed)
Worsening symptoms. Severe arthritic changes. Mild weakness of the right lower extremity. Discussed possible advance imaging with patient having some mild weakness. We discussed which activities to do in which ones to avoid. Depending on imaging patient could be a candidate for epidurals or nerve root injections. Patient elected to avoid any type of surgical intervention but with the weakness I do feel that this is necessary. Increase gabapentin to 300 mg. Depending on imaging will discuss further treatment options.

## 2016-06-23 NOTE — Assessment & Plan Note (Signed)
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 Encourage patient to take the gabapentin and increased to 300 mg. Patient is having worsening symptoms at this time. Has been a long time symptoms advance imaging. MRI of the cervical neck as well as back ordered today for further evaluation. Patient will continue all other conservative therapy. Depending on findings on the MRI he could be a candidate for epidurals. We'll hold on osteopathic manipulation at this point.

## 2016-06-25 MED FILL — TESTOSTERONE CYP 200 MG/ML: 200 | 90 days supply | Qty: 6 | Fill #0

## 2016-06-27 ENCOUNTER — Other Ambulatory Visit: Payer: 59

## 2016-07-02 MED FILL — CYCLOBENZAPRINE 10 MG TAB: 10 | 30 days supply | Qty: 60 | Fill #5

## 2016-07-02 MED FILL — RISEDRONATE NA 150 MG TAB: 150 | 30 days supply | Qty: 1 | Fill #5

## 2016-07-02 MED FILL — OMEPRAZOLE DR 40 MG CAPSULE: 40 | 30 days supply | Qty: 30 | Fill #3

## 2016-07-07 ENCOUNTER — Encounter: Payer: Self-pay | Admitting: Family Medicine

## 2016-07-07 ENCOUNTER — Ambulatory Visit
Admission: RE | Admit: 2016-07-07 | Discharge: 2016-07-07 | Disposition: A | Discharge status: Home / Self Care | Payer: 59 | Source: Ambulatory Visit | Attending: Family Medicine | Admitting: Family Medicine

## 2016-07-07 DIAGNOSIS — M5136 Other intervertebral disc degeneration, lumbar region: Secondary | ICD-10-CM

## 2016-07-07 DIAGNOSIS — M5030 Other cervical disc degeneration, unspecified cervical region: Secondary | ICD-10-CM

## 2016-07-07 DIAGNOSIS — M5126 Other intervertebral disc displacement, lumbar region: Secondary | ICD-10-CM | POA: Diagnosis not present

## 2016-07-07 DIAGNOSIS — M4802 Spinal stenosis, cervical region: Secondary | ICD-10-CM | POA: Diagnosis not present

## 2016-07-20 ENCOUNTER — Ambulatory Visit: Payer: 59 | Attending: Family Medicine | Admitting: Physical Therapy

## 2016-07-20 DIAGNOSIS — M542 Cervicalgia: Secondary | ICD-10-CM | POA: Diagnosis not present

## 2016-07-20 DIAGNOSIS — R293 Abnormal posture: Secondary | ICD-10-CM | POA: Insufficient documentation

## 2016-07-20 NOTE — Patient Instructions (Signed)
Axial Extension (Chin Tuck)    Pull chin in and lengthen back of neck. Hold __5-10__ seconds while counting out loud. Repeat __15__ times. Do _2-3___ sessions per day.    Flexibility: Upper Trapezius Stretch    Gently grasp right side of head while reaching behind back with other hand. Tilt head away until a gentle stretch is felt. Hold __30__ seconds. Repeat __3__ times per set. Do ____ sets per session. Do __2__ sessions per day.     Levator Scapula Stretch, Sitting    Sit, one hand tucked under hip on side to be stretched, other hand over top of head. Turn head toward other side and look down. Use hand on head to gently stretch neck in that position. Hold _30__ seconds. Repeat __3_ times per session. Do _2__ sessions per day. (nose to pocket)    Scapular Retraction (Standing)    With arms at sides, pinch shoulder blades together. Repeat __15__ times per set. Do ____ sets per session. Do __2-3__ sessions per day.

## 2016-07-20 NOTE — Therapy (Signed)
[facility] [address]  [place], [place], [address] Phone: [phone]   Fax:  [phone]  Physical Therapy Evaluation  Patient Details  Name: Erik [name] MRN: [id number] Date of Birth: [birth year] Referring Provider: Dr. Hulan Saas  Encounter Date: 07/20/2016      PT End of Session - 07/20/16 0909    Visit Number 1   Number of Visits 12   Date for PT Re-Evaluation 08/31/16   PT Start Time 0803   PT Stop Time 0840   PT Time Calculation (min) 37 min   Activity Tolerance Patient tolerated treatment well   Behavior During Therapy [facility] for tasks assessed/performed      Past Medical History:  Diagnosis Date  . 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 ALLERGIC RHINITIS 12/19/2009  . Back pain chronic    On hydrocodone-Neurontin  . Blepharitis    B, chronic  . BPH (benign prostatic hyperplasia)   . Chronic prostatitis     f/u by urology, has a prostate massage prn  . Groin injury   . HYPOGONADISM, MALE 08/11/2007   f/u by Dr Thomasene Mohair urology    . OSTEOPOROSIS, IDIOPATHIC 08/11/2007   f/u at [facility] , now improved to osteopenia  . Raynaud's disease /phenomenon 08/16/2012  . Rosacea   . Shingles 09-2014  . Varicose veins     Past Surgical History:  Procedure Laterality Date  . BLEPHAROPLASTY     B blepharoplasty @ [facility] 08-2011  . CERVICAL FUSION  1994   C4-C5-C6 fusion  . IR GENERIC HISTORICAL  03/17/2016   IR EMBO VENOUS NOT HEMORR HEMANG  INC GUIDE ROADMAPPING 03/17/2016 Greggory Keen, MD [facility]TR[facility]  . TRANSURETHRAL RESECTION OF PROSTATE  07-2009    There were no vitals filed for this visit.       Subjective Assessment - 07/20/16 0804    Subjective Patient reporitng chronic neck/low back pain - MRI and Xray revealing arthritic changes. Limited ROM of cervical spine. Cervical fusion of C4-6 in 1995. Patient reporting MD thinking arthritic changes around C6-7. Intermittent N&T into UE -  has gone away since taking gabapentin. Has had injections in the past - wantint to remain conservative. Has most diffculty with head turns ot the left. Not currenlty in pain, max pain of 7/10, but has improved with gabapentin.    Diagnostic tests Xray & MRI - arthritic changes   Patient Stated Goals reduce pain, improve motion   Currently in Pain? No/denies   Multiple Pain Sites No            [facility] PT Assessment - 07/20/16 0808      Assessment   Medical Diagnosis Degenerative cervicval disc   Referring Provider Dr. Hulan Saas   Hand Dominance Right   Next MD Visit prn   Prior Therapy yes     Precautions   Precautions None     Balance Screen   Has the patient fallen in the past 6 months No   Has the patient had a decrease in activity level because of a fear of falling?  No   Is the patient reluctant to leave their home because of a fear of falling?  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  Leisure fishing     Cognition   Overall Cognitive Status Within Functional Limits for tasks assessed     Observation/Other Assessments   Focus on Therapeutic Outcomes (FOTO)  Neck: 54 (46% limited, predicted 37% limited)     ROM / Strength   AROM / PROM / Strength AROM;PROM;Strength     AROM   AROM Assessment Site Cervical   Cervical Flexion 20   Cervical Extension 46   Cervical - Right Side Bend 20   Cervical - Left Side Bend 18   Cervical - Right Rotation 54   Cervical - Left Rotation 28     PROM   Overall PROM Comments improved motion with B lateral flexion and rotation - however, guarded and reduced ability to fully relax for passive moveements     Strength   Overall Strength Within functional limits for tasks performed   Overall Strength Comments grossly 4/5 for B UE     Palpation    Palpation comment tenderness to palpation along B UT, B levator scap, B cervical paraspinals, as well as Bsuboccipital mm                           PT Education - 07/20/16 0909    Education provided Yes   Education Details exam findings, POC, initial HEP   Person(s) Educated Patient   Methods Explanation;Demonstration;Handout   Comprehension Verbalized understanding;Returned demonstration             PT Long Term Goals - 07/20/16 0935      PT LONG TERM GOAL #1   Title [name] to be independent with advanced HEP (08/31/16)   Status New     PT LONG TERM GOAL #2   Title Patient to improve cervical AROM, primarily lateral flexion and rotation, to symmetrical and WNL (08/31/16)   Status New     PT LONG TERM GOAL #3   Title Patient to demonstrate good postural alignment with ability to self correct for reduced stress placed on neck/upper back musculature (08/31/16)   Status New     PT LONG TERM GOAL #4   Title Patient to report greater ease with head turns during driving and other functional tasks with no increase in pain (08/31/16)   Status New     PT LONG TERM GOAL #5   Title Patient to report pain no greater than 1/10 along with no radicular symptoms for greater than 2 weeks (08/31/16)   Status New               Plan - 07/20/16 0910    Clinical Impression Statement Patient is a 61 y/o male presenting to [facility] today for low complexity initial evaluation regarding chronic neck pain. Patient with a history significant for cervical fusion of C4-6, as well as microdiscectomy of L4-5. Patient with recent MRI and xray of cervical spine with noted arthritic and age related changes per patient. Patient today with loss of AROM with pain limiting, as well as slight postural dysfunction which may be placing increased stressors on neck and upper back musculature. Patient with noted palpable tissue tightness with tenderness to palpation along B UT, B levator scap, B  cervical paraspinals and B suboccipital mm. Patient to benefit from PT to [facility] above listed deficits to allow for improved cervical ROM and pain  to promote improved function.    Rehab Potential Good   PT Frequency 2x / week   PT Duration 6 weeks  PT Treatment/Interventions ADLs/Self Care Home Management;Cryotherapy;Electrical Stimulation;Moist Heat;Ultrasound;Therapeutic exercise;Therapeutic activities;Patient/family education;Manual techniques;Passive range of motion;Taping;Dry needling   PT Next Visit Plan manual work on neck/shoulders, postural strengthening, cervical ROM   Consulted and Agree with Plan of Care Patient      Patient will benefit from skilled therapeutic intervention in order to improve the following deficits and impairments:  Decreased activity tolerance, Decreased range of motion, Increased muscle spasms, Pain  Visit Diagnosis: Cervicalgia - Plan: PT plan of care cert/re-cert  Abnormal posture - Plan: PT plan of care cert/re-cert     Problem List Patient Active Problem List   Diagnosis Date Noted  . Degenerative cervical disc 06/08/2016  . Degenerative lumbar disc 06/08/2016  . Nonallopathic lesion of lumbosacral region 06/08/2016  . Nonallopathic lesion of sacral region 06/08/2016  . Nonallopathic lesion of thoracic region 06/08/2016  . Right leg pain 08/07/2015  . PCP NOTES >>> 04/29/2015  . Rib pain on right side 11/08/2014  . Binocular vision disorder with diplopia 10/01/2014  . Open angle with borderline findings, high risk 10/01/2014  . 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 Herpes zoster-- 09-2014 09/05/2014  . Anal fissure 02/28/2014  . Chronic prostatitis 02/28/2014  . Abnormal prostate specific antigen 02/28/2014  . Acid reflux 02/28/2014  . Ganglion of tendon 02/28/2014  . Incomplete bladder emptying 02/28/2014  . FOM (frequency of micturition) 02/28/2014  . Osteopenia 02/28/2014  .  Sesamoiditis 02/28/2014  . Urgency of micturation 02/28/2014  . Rosacea blepharoconjunctivitis 02/12/2014  . Nonexudative age-related macular degeneration 02/12/2014  . Baker's cyst of knee 04/26/2013  . 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 Speech abnormality 02/13/2013  . Raynaud phenomenon 08/16/2012  . Glaucoma suspect 04/19/2012  . Blepharitis 04/19/2012  . Annual physical exam 10/21/2011  . Neck -- back pain- UDS 09/18/2011  . ALLERGIC RHINITIS 12/19/2009  . PELVIC PAIN, CHRONIC 04/03/2009  . Hypogonadism -- per urology 08/11/2007  . OSTEOPOROSIS, IDIOPATHIC 08/11/2007  . HEADACHE 08/11/2007     Lanney Gins, PT, DPT 07/20/16 9:41 AM    [facility] [address]  [place], [place], [address] Phone: [phone]   Fax:  [phone]  Name: ELADIO MEDA MRN: [id number] Date of Birth: [birth year]

## 2016-07-27 ENCOUNTER — Ambulatory Visit: Payer: 59 | Admitting: Physical Therapy

## 2016-07-27 DIAGNOSIS — M542 Cervicalgia: Secondary | ICD-10-CM | POA: Diagnosis not present

## 2016-07-27 DIAGNOSIS — R293 Abnormal posture: Secondary | ICD-10-CM | POA: Diagnosis not present

## 2016-07-27 NOTE — Therapy (Signed)
[facility] [address]  [place], [place], [address] Phone: [phone]   Fax:  [phone]  Physical Therapy Treatment  Patient Details  Name: Erik [name] MRN: [id number] Date of Birth: 1955/11/22 Referring Provider: Dr. Hulan Saas  Encounter Date: 07/27/2016      PT End of Session - 07/27/16 0808    Visit Number 2   Number of Visits 12   Date for PT Re-Evaluation 08/31/16   PT Start Time 0805   PT Stop Time 0846   PT Time Calculation (min) 41 min   Activity Tolerance Patient tolerated treatment well   Behavior During Therapy [facility] for tasks assessed/performed      Past Medical History:  Diagnosis Date  . 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 ALLERGIC RHINITIS 12/19/2009  . Back pain chronic    On hydrocodone-Neurontin  . Blepharitis    B, chronic  . BPH (benign prostatic hyperplasia)   . Chronic prostatitis     f/u by urology, has a prostate massage prn  . Groin injury   . HYPOGONADISM, MALE 08/11/2007   f/u by Dr Thomasene Mohair urology    . OSTEOPOROSIS, IDIOPATHIC 08/11/2007   f/u at [facility] , now improved to osteopenia  . Raynaud's disease /phenomenon 08/16/2012  . Rosacea   . Shingles 09-2014  . Varicose veins     Past Surgical History:  Procedure Laterality Date  . BLEPHAROPLASTY     B blepharoplasty @ [facility] 08-2011  . CERVICAL FUSION  1994   C4-C5-C6 fusion  . IR GENERIC HISTORICAL  03/17/2016   IR EMBO VENOUS NOT HEMORR HEMANG  INC GUIDE ROADMAPPING 03/17/2016 Greggory Keen, MD [facility]  . TRANSURETHRAL RESECTION OF PROSTATE  07-2009    There were no vitals filed for this visit.      Subjective Assessment - 07/27/16 0807    Subjective HEP is "tough" - feels like muscles dont want to loosen up   Diagnostic tests Xray & MRI - arthritic changes   Patient Stated Goals reduce pain, improve motion   Currently in Pain? Yes   Pain Score 3    Pain Location Neck   Pain  Orientation Left   Pain Descriptors / Indicators Aching;Dull                         [facility] Adult PT Treatment/Exercise - 07/27/16 0810      Exercises   Exercises Neck;Shoulder     Neck Exercises: Seated   Neck Retraction 10 reps;5 secs     Shoulder Exercises: Seated   Horizontal ABduction Strengthening;Both;15 reps;Theraband   Theraband Level (Shoulder Horizontal ABduction) Level 3 (Green)   Horizontal ABduction Limitations with scap squeeze      Shoulder Exercises: Standing   Row Strengthening;Both;15 reps;Theraband   Theraband Level (Shoulder Row) Level 3 (Green)   Row Limitations with scap squeeze     Manual Therapy   Manual Therapy Soft tissue mobilization;Myofascial release;Passive ROM   Soft tissue mobilization STM of B UT, B levator scap, B cervical paraspinals and B suboccipital mm.   Myofascial Release manual trigger point release of cervical paraspinals; suboccipital release 2 x 1 minute   Passive ROM PROM of cervical lateral flexion with slihgt over pressure for 2 x 1 minutes each side for UT stretch; cervical flexion/rotation 2 x 1 minute each for levator stretch     Neck Exercises: Stretches   Upper Trapezius Stretch  2 reps;60 seconds   Upper Trapezius Stretch Limitations bilateral - PT led   Levator Stretch 2 reps;60 seconds   Levator Stretch Limitations bilateral - PT led   Other Neck Stretches cervical rotation - 2 x 1 minute each side                     PT Long Term Goals - 07/27/16 0856      PT LONG TERM GOAL #1   Title [name] to be independent with advanced HEP (08/31/16)   Status On-going     PT LONG TERM GOAL #2   Title Patient to improve cervical AROM, primarily lateral flexion and rotation, to symmetrical and WNL (08/31/16)   Status On-going     PT LONG TERM GOAL #3   Title Patient to demonstrate good postural alignment with ability to self correct for reduced stress placed on neck/upper back musculature (08/31/16)    Status On-going     PT LONG TERM GOAL #4   Title Patient to report greater ease with head turns during driving and other functional tasks with no increase in pain (08/31/16)   Status On-going     PT LONG TERM GOAL #5   Title Patient to report pain no greater than 1/10 along with no radicular symptoms for greater than 2 weeks (08/31/16)   Status On-going               Plan - 07/27/16 0809    Clinical Impression Statement Patient today with continued subjective reports of neck pain with L>R. Patient reports good compliance with HEP, however, feels like muscles are still tight. Patient tolerating all passive manual therapy and stretching today with no increase in pain. Updated HEP to inlcude rows with scap squeeze with good understanding and demonstration.    PT Treatment/Interventions ADLs/Self Care Home Management;Cryotherapy;Electrical Stimulation;Moist Heat;Ultrasound;Therapeutic exercise;Therapeutic activities;Patient/family education;Manual techniques;Passive range of motion;Taping;Dry needling   PT Next Visit Plan manual work on neck/shoulders, postural strengthening, cervical ROM   Consulted and Agree with Plan of Care Patient      Patient will benefit from skilled therapeutic intervention in order to improve the following deficits and impairments:  Decreased activity tolerance, Decreased range of motion, Increased muscle spasms, Pain  Visit Diagnosis: Cervicalgia  Abnormal posture     Problem List Patient Active Problem List   Diagnosis Date Noted  . Degenerative cervical disc 06/08/2016  . Degenerative lumbar disc 06/08/2016  . Nonallopathic lesion of lumbosacral region 06/08/2016  . Nonallopathic lesion of sacral region 06/08/2016  . Nonallopathic lesion of thoracic region 06/08/2016  . Right leg pain 08/07/2015  . PCP NOTES >>> 04/29/2015  . Rib pain on right side 11/08/2014  . Binocular vision disorder with diplopia 10/01/2014  . Open angle with borderline  findings, high risk 10/01/2014  . 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 Herpes zoster-- 09-2014 09/05/2014  . Anal fissure 02/28/2014  . Chronic prostatitis 02/28/2014  . Abnormal prostate specific antigen 02/28/2014  . Acid reflux 02/28/2014  . Ganglion of tendon 02/28/2014  . Incomplete bladder emptying 02/28/2014  . FOM (frequency of micturition) 02/28/2014  . Osteopenia 02/28/2014  . Sesamoiditis 02/28/2014  . Urgency of micturation 02/28/2014  . Rosacea blepharoconjunctivitis 02/12/2014  . Nonexudative age-related macular degeneration 02/12/2014  . Baker's cyst of knee 04/26/2013  . 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 Speech abnormality 02/13/2013  . Raynaud phenomenon 08/16/2012  . Glaucoma suspect 04/19/2012  .  Blepharitis 04/19/2012  . Annual physical exam 10/21/2011  . Neck -- back pain- UDS 09/18/2011  . ALLERGIC RHINITIS 12/19/2009  . PELVIC PAIN, CHRONIC 04/03/2009  . Hypogonadism -- per urology 08/11/2007  . OSTEOPOROSIS, IDIOPATHIC 08/11/2007  . HEADACHE 08/11/2007    Lanney Gins, PT, DPT 07/27/16 9:00 AM   [facility] [address]  [place], [place], [address] Phone: [phone]   Fax:  [phone]  Name: Erik [name] MRN: [id number] Date of Birth: 1956-02-07

## 2016-08-03 ENCOUNTER — Ambulatory Visit: Payer: 59

## 2016-08-03 DIAGNOSIS — M542 Cervicalgia: Secondary | ICD-10-CM

## 2016-08-03 DIAGNOSIS — R293 Abnormal posture: Secondary | ICD-10-CM | POA: Diagnosis not present

## 2016-08-03 NOTE — Therapy (Signed)
[facility] [address]  [place], [place], [address] Phone: [phone]   Fax:  [phone]  Physical Therapy Treatment  Patient Details  Name: Erik [name] MRN: [id number] Date of Birth: 07-Jun-1956 Referring Provider: Dr. Hulan Saas  Encounter Date: 08/03/2016      PT End of Session - 08/03/16 0812    Visit Number 3   Number of Visits 12   Date for PT Re-Evaluation 08/31/16   PT Start Time 0809  Pt. arrived late    PT Stop Time 0853  Moist heat to end treatment    PT Time Calculation (min) 44 min   Activity Tolerance Patient tolerated treatment well   Behavior During Therapy [facility] for tasks assessed/performed      Past Medical History:  Diagnosis Date  . 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 ALLERGIC RHINITIS 12/19/2009  . Back pain chronic    On hydrocodone-Neurontin  . Blepharitis    B, chronic  . BPH (benign prostatic hyperplasia)   . Chronic prostatitis     f/u by urology, has a prostate massage prn  . Groin injury   . HYPOGONADISM, MALE 08/11/2007   f/u by Dr Thomasene Mohair urology    . OSTEOPOROSIS, IDIOPATHIC 08/11/2007   f/u at [facility] , now improved to osteopenia  . Raynaud's disease /phenomenon 08/16/2012  . Rosacea   . Shingles 09-2014  . Varicose veins     Past Surgical History:  Procedure Laterality Date  . BLEPHAROPLASTY     B blepharoplasty @ [facility] 08-2011  . CERVICAL FUSION  1994   C4-C5-C6 fusion  . IR GENERIC HISTORICAL  03/17/2016   IR EMBO VENOUS NOT HEMORR HEMANG  INC GUIDE ROADMAPPING 03/17/2016 Greggory Keen, MD [facility]  . TRANSURETHRAL RESECTION OF PROSTATE  07-2009    There were no vitals filed for this visit.      Subjective Assessment - 08/03/16 0814    Subjective Pt. reporting he has been able to perform HEP 1x/day without issue.     Patient Stated Goals reduce pain, improve motion   Currently in Pain? Yes   Pain Score 3    Pain Location Neck   Pain Orientation Right   Pain Descriptors / Indicators Aching;Sharp   Pain Type Chronic pain   Pain Radiating Towards n/a today   Pain Onset More than a month ago   Pain Frequency Intermittent   Aggravating Factors  turning head    Pain Relieving Factors changing positions   Effect of Pain on Daily Activities hard to check blind spot in car   Multiple Pain Sites No          [facility] Adult PT Treatment/Exercise - 08/03/16 0818      Neck Exercises: Machines for Strengthening   UBE (Upper Arm Bike) UBE: level 2.0, 3 min each way     Shoulder Exercises: Standing   Horizontal ABduction AROM;Strengthening;Both;10 reps   Theraband Level (Shoulder Horizontal ABduction) Level 3 (Green)   Horizontal ABduction Limitations 5" hold   External Rotation AROM;Strengthening;Both;10 reps;Theraband   Theraband Level (Shoulder External Rotation) Level 2 (Red)  5" hold; leaning on doorseal     Modalities   Modalities Moist Heat     Moist Heat Therapy   Number Minutes Moist Heat 10 Minutes   Moist Heat Location Cervical     Manual Therapy   Manual Therapy Passive ROM   Passive ROM PROM of cervical lateral flexion with slihgt  over pressure for 2 x 1 minutes each side for UT stretch; cervical flexion/rotation 2 x 1 minute each for levator stretch     Neck Exercises: Stretches   Upper Trapezius Stretch 2 reps;30 seconds   Upper Trapezius Stretch Limitations bilateral with off arm anchored    Levator Stretch 2 reps;30 seconds   Levator Stretch Limitations bilateral with off arm anchored            PT Long Term Goals - 07/27/16 0856      PT LONG TERM GOAL #1   Title [name] to be independent with advanced HEP (08/31/16)   Status On-going     PT LONG TERM GOAL #2   Title Patient to improve cervical AROM, primarily lateral flexion and rotation, to symmetrical and WNL (08/31/16)   Status On-going     PT LONG TERM GOAL #3   Title Patient to demonstrate good postural alignment with ability to  self correct for reduced stress placed on neck/upper back musculature (08/31/16)   Status On-going     PT LONG TERM GOAL #4   Title Patient to report greater ease with head turns during driving and other functional tasks with no increase in pain (08/31/16)   Status On-going     PT LONG TERM GOAL #5   Title Patient to report pain no greater than 1/10 along with no radicular symptoms for greater than 2 weeks (08/31/16)   Status On-going               Plan - 08/03/16 0813    Clinical Impression Statement Pt. arriving late to therapy today thus treatment shortened.  Today's treatment focused on manual and self-stretching to cervical musculature to improve ROM and continued scapular strengthening.  Pt. tolerating all activities well today in treatment.  Moist heat applied to cervical spine to end treatment today with good response from pt.  Significant time taken today educating pt. on proper sitting posture and need for neutral spine while sitting at desk.  Pt. verbalizing understanding of proper sitting posture today.  Will plan to continue education on proper posture and body mechanics with pt. in coming visits.     PT Treatment/Interventions ADLs/Self Care Home Management;Cryotherapy;Electrical Stimulation;Moist Heat;Ultrasound;Therapeutic exercise;Therapeutic activities;Patient/family education;Manual techniques;Passive range of motion;Taping;Dry needling   PT Next Visit Plan manual work on neck/shoulders, postural strengthening, cervical ROM      Patient will benefit from skilled therapeutic intervention in order to improve the following deficits and impairments:  Decreased activity tolerance, Decreased range of motion, Increased muscle spasms, Pain  Visit Diagnosis: Cervicalgia  Abnormal posture     Problem List Patient Active Problem List   Diagnosis Date Noted  . Degenerative cervical disc 06/08/2016  . Degenerative lumbar disc 06/08/2016  . Nonallopathic lesion of  lumbosacral region 06/08/2016  . Nonallopathic lesion of sacral region 06/08/2016  . Nonallopathic lesion of thoracic region 06/08/2016  . Right leg pain 08/07/2015  . PCP NOTES >>> 04/29/2015  . Rib pain on right side 11/08/2014  . Binocular vision disorder with diplopia 10/01/2014  . Open angle with borderline findings, high risk 10/01/2014  . 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 Herpes zoster-- 09-2014 09/05/2014  . Anal fissure 02/28/2014  . Chronic prostatitis 02/28/2014  . Abnormal prostate specific antigen 02/28/2014  . Acid reflux 02/28/2014  . Ganglion of tendon 02/28/2014  . Incomplete bladder emptying 02/28/2014  . FOM (frequency of micturition) 02/28/2014  . Osteopenia 02/28/2014  .  Sesamoiditis 02/28/2014  . Urgency of micturation 02/28/2014  . Rosacea blepharoconjunctivitis 02/12/2014  . Nonexudative age-related macular degeneration 02/12/2014  . Baker's cyst of knee 04/26/2013  . 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 Speech abnormality 02/13/2013  . Raynaud phenomenon 08/16/2012  . Glaucoma suspect 04/19/2012  . Blepharitis 04/19/2012  . Annual physical exam 10/21/2011  . Neck -- back pain- UDS 09/18/2011  . ALLERGIC RHINITIS 12/19/2009  . PELVIC PAIN, CHRONIC 04/03/2009  . Hypogonadism -- per urology 08/11/2007  . OSTEOPOROSIS, IDIOPATHIC 08/11/2007  . HEADACHE 08/11/2007    Bess Harvest, PTA 08/03/16 10:09 AM  [facility] [address]  [place], [place], [address] Phone: [phone]   Fax:  [phone]  Name: Erik [name] MRN: [id number] Date of Birth: [birth year]

## 2016-08-05 MED FILL — GABAPENTIN 300 MG CAPSULE: 300 | 30 days supply | Qty: 30 | Fill #1

## 2016-08-05 MED FILL — RISEDRONATE NA 150 MG TAB: 150 | 30 days supply | Qty: 1 | Fill #6

## 2016-08-10 ENCOUNTER — Ambulatory Visit: Payer: 59 | Admitting: Physical Therapy

## 2016-08-10 DIAGNOSIS — N411 Chronic prostatitis: Secondary | ICD-10-CM | POA: Diagnosis not present

## 2016-08-12 ENCOUNTER — Ambulatory Visit: Payer: 59 | Attending: Family Medicine | Admitting: Physical Therapy

## 2016-08-12 DIAGNOSIS — M542 Cervicalgia: Secondary | ICD-10-CM | POA: Diagnosis not present

## 2016-08-12 DIAGNOSIS — R293 Abnormal posture: Secondary | ICD-10-CM | POA: Diagnosis not present

## 2016-08-12 NOTE — Therapy (Signed)
[facility] [address]  [place], [place], [address] Phone: [phone]   Fax:  [phone]  Physical Therapy Treatment  Patient Details  Name: Erik [name] MRN: [id number] Date of Birth: [birth year] Referring Provider: Dr. Hulan Saas  Encounter Date: 08/12/2016      PT End of Session - 08/12/16 0810    Visit Number 4   Number of Visits 12   Date for PT Re-Evaluation 08/31/16   PT Start Time 0808  pt late   PT Stop Time 0847   PT Time Calculation (min) 39 min   Activity Tolerance Patient tolerated treatment well   Behavior During Therapy [facility] for tasks assessed/performed      Past Medical History:  Diagnosis Date  . 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 ALLERGIC RHINITIS 12/19/2009  . Back pain chronic    On hydrocodone-Neurontin  . Blepharitis    B, chronic  . BPH (benign prostatic hyperplasia)   . Chronic prostatitis     f/u by urology, has a prostate massage prn  . Groin injury   . HYPOGONADISM, MALE 08/11/2007   f/u by Dr Thomasene Mohair urology    . OSTEOPOROSIS, IDIOPATHIC 08/11/2007   f/u at [facility] , now improved to osteopenia  . Raynaud's disease /phenomenon 08/16/2012  . Rosacea   . Shingles 09-2014  . Varicose veins     Past Surgical History:  Procedure Laterality Date  . BLEPHAROPLASTY     B blepharoplasty @ [facility] 08-2011  . CERVICAL FUSION  1994   C4-C5-C6 fusion  . IR GENERIC HISTORICAL  03/17/2016   IR EMBO VENOUS NOT HEMORR HEMANG  INC GUIDE ROADMAPPING 03/17/2016 Greggory Keen, MD [facility] ULTRASOUND  . TRANSURETHRAL RESECTION OF PROSTATE  07-2009    There were no vitals filed for this visit.      Subjective Assessment - 08/12/16 0808    Subjective Neck is feeling well - after last session had an increase in pain, feeling better now   Diagnostic tests Xray & MRI - arthritic changes   Patient Stated Goals reduce pain, improve motion   Currently in Pain? Yes   Pain Score 1    Pain Location Neck   Pain Orientation Medial   Pain Descriptors / Indicators Aching;Sore   Pain Type Chronic pain   Pain Onset More than a month ago   Pain Frequency Intermittent   Aggravating Factors  turning head   Pain Relieving Factors changing positions                         [facility] Adult PT Treatment/Exercise - 08/12/16 0811      Neck Exercises: Machines for Strengthening   UBE (Upper Arm Bike) level 2.5 x 6 minutes (3/3)     Neck Exercises: Seated   Neck Retraction 10 reps     Shoulder Exercises: Standing   Horizontal ABduction Limitations HEP review   External Rotation Strengthening;Both;15 reps;Theraband   Theraband Level (Shoulder External Rotation) Level 3 (Green)  with scap squeeze   Extension Strengthening;Both;15 reps;Theraband   Theraband Level (Shoulder Extension) Level 3 (Green)   Extension Limitations with scap squeeze   Row Strengthening;Both;15 reps;Theraband   Theraband Level (Shoulder Row) Level 3 (Green)   Row Limitations 5" hold with scap squeeze     Manual Therapy   Manual Therapy Soft tissue mobilization;Myofascial release;Passive ROM   Soft tissue mobilization STM of B UT, B levator scap,  B cervical paraspinals and B suboccipital mm.   Myofascial Release manual trigger point release of B UT (R>L); suboccipital release 2 x 1 minute   Passive ROM PROM of cervical lateral flexion with slihgt over pressure for 2 x 1 minutes each side for UT stretch; cervical flexion/rotation 1 x 1 minute each for levator stretch                     PT Long Term Goals - 08/12/16 0909      PT LONG TERM GOAL #1   Title [name] to be independent with advanced HEP (08/31/16)   Status On-going     PT LONG TERM GOAL #2   Title Patient to improve cervical AROM, primarily lateral flexion and rotation, to symmetrical and WNL (08/31/16)   Status On-going     PT LONG TERM GOAL #3   Title Patient to demonstrate good postural alignment with ability  to self correct for reduced stress placed on neck/upper back musculature (08/31/16)   Status On-going     PT LONG TERM GOAL #4   Title Patient to report greater ease with head turns during driving and other functional tasks with no increase in pain (08/31/16)   Status On-going     PT LONG TERM GOAL #5   Title Patient to report pain no greater than 1/10 along with no radicular symptoms for greater than 2 weeks (08/31/16)   Status On-going               Plan - 08/12/16 [id number]    Clinical Impression Statement Erik [name] doing well today. Patient reporitng an increase in neck pain following last session - of which he does not know cause. Patient today with minimal pain with subjective reports of feeling better overall. Patient tolerating all therapeutic exericse as well as manaual therapy today with no increase in pain. Tissue quality seems to be improving with very few palpable bands noted in B UT and levator scap. Patient to continue to beneift from PT to maximize function.    PT Treatment/Interventions ADLs/Self Care Home Management;Cryotherapy;Electrical Stimulation;Moist Heat;Ultrasound;Therapeutic exercise;Therapeutic activities;Patient/family education;Manual techniques;Passive range of motion;Taping;Dry needling   PT Next Visit Plan manual work on neck/shoulders, postural strengthening, cervical ROM   Consulted and Agree with Plan of Care Patient      Patient will benefit from skilled therapeutic intervention in order to improve the following deficits and impairments:  Decreased activity tolerance, Decreased range of motion, Increased muscle spasms, Pain  Visit Diagnosis: Cervicalgia  Abnormal posture     Problem List Patient Active Problem List   Diagnosis Date Noted  . Degenerative cervical disc 06/08/2016  . Degenerative lumbar disc 06/08/2016  . Nonallopathic lesion of lumbosacral region 06/08/2016  . Nonallopathic lesion of sacral region 06/08/2016  . Nonallopathic  lesion of thoracic region 06/08/2016  . Right leg pain 08/07/2015  . PCP NOTES >>> 04/29/2015  . Rib pain on right side 11/08/2014  . Binocular vision disorder with diplopia 10/01/2014  . Open angle with borderline findings, high risk 10/01/2014  . 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 Herpes zoster-- 09-2014 09/05/2014  . Anal fissure 02/28/2014  . Chronic prostatitis 02/28/2014  . Abnormal prostate specific antigen 02/28/2014  . Acid reflux 02/28/2014  . Ganglion of tendon 02/28/2014  . Incomplete bladder emptying 02/28/2014  . FOM (frequency of micturition) 02/28/2014  . Osteopenia 02/28/2014  . Sesamoiditis 02/28/2014  . Urgency of micturation  02/28/2014  . Rosacea blepharoconjunctivitis 02/12/2014  . Nonexudative age-related macular degeneration 02/12/2014  . Baker's cyst of knee 04/26/2013  . 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 Speech abnormality 02/13/2013  . Raynaud phenomenon 08/16/2012  . Glaucoma suspect 04/19/2012  . Blepharitis 04/19/2012  . Annual physical exam 10/21/2011  . Neck -- back pain- UDS 09/18/2011  . ALLERGIC RHINITIS 12/19/2009  . PELVIC PAIN, CHRONIC 04/03/2009  . Hypogonadism -- per urology 08/11/2007  . OSTEOPOROSIS, IDIOPATHIC 08/11/2007  . HEADACHE 08/11/2007    Lanney Gins, PT, DPT 08/12/16 9:10 AM   [facility] [address]  [place], [place], [address] Phone: [phone]   Fax:  [phone]  Name: Erik [name] MRN: [id number] Date of Birth: September 23, 1955

## 2016-08-19 ENCOUNTER — Ambulatory Visit: Payer: 59 | Admitting: Physical Therapy

## 2016-08-21 ENCOUNTER — Ambulatory Visit: Payer: 59

## 2016-08-25 ENCOUNTER — Ambulatory Visit: Payer: 59 | Admitting: Physical Therapy

## 2016-08-25 DIAGNOSIS — M542 Cervicalgia: Secondary | ICD-10-CM | POA: Diagnosis not present

## 2016-08-25 DIAGNOSIS — R293 Abnormal posture: Secondary | ICD-10-CM

## 2016-08-25 NOTE — Therapy (Addendum)
[facility] [address]  [place], [place], [address] Phone: [phone]   Fax:  [phone]  Physical Therapy Treatment  Patient Details  Name: Erik [name] MRN: [id number] Date of Birth: Jul 08, 1955 Referring Provider: Dr. Hulan Saas  Encounter Date: 08/25/2016      PT End of Session - 08/25/16 0804    Visit Number 5   Number of Visits 12   Date for PT Re-Evaluation 08/31/16   PT Start Time 0801   PT Stop Time 0842   PT Time Calculation (min) 41 min   Activity Tolerance Patient tolerated treatment well   Behavior During Therapy [facility] for tasks assessed/performed      Past Medical History:  Diagnosis Date  . 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 ALLERGIC RHINITIS 12/19/2009  . Back pain chronic    On hydrocodone-Neurontin  . Blepharitis    B, chronic  . BPH (benign prostatic hyperplasia)   . Chronic prostatitis     f/u by urology, has a prostate massage prn  . Groin injury   . HYPOGONADISM, MALE 08/11/2007   f/u by Dr Thomasene Mohair urology    . OSTEOPOROSIS, IDIOPATHIC 08/11/2007   f/u at [facility] , now improved to osteopenia  . Raynaud's disease /phenomenon 08/16/2012  . Rosacea   . Shingles 09-2014  . Varicose veins     Past Surgical History:  Procedure Laterality Date  . BLEPHAROPLASTY     B blepharoplasty @ [facility] 08-2011  . CERVICAL FUSION  1994   C4-C5-C6 fusion  . IR GENERIC HISTORICAL  03/17/2016   IR EMBO VENOUS NOT HEMORR HEMANG  INC GUIDE ROADMAPPING 03/17/2016 Greggory Keen, MD [facility]TRASOUND  . TRANSURETHRAL RESECTION OF PROSTATE  07-2009    There were no vitals filed for this visit.      Subjective Assessment - 08/25/16 0803    Subjective feeling well - pain remains at 1-2/10 - feels like movement has improved   Diagnostic tests Xray & MRI - arthritic changes   Patient Stated Goals reduce pain, improve motion   Currently in Pain? Yes   Pain Score 1    Pain Location Neck   Pain Descriptors / Indicators Aching;Sore   Pain Type Chronic pain   Pain Onset More than a month ago   Pain Frequency Intermittent            [facility] PT Assessment - 08/25/16 0001      Assessment   Medical Diagnosis Degenerative cervicval disc   Referring Provider Dr. Hulan Saas     Observation/Other Assessments   Focus on Therapeutic Outcomes (FOTO)  Neck: 58 (42% limited)     AROM   AROM Assessment Site Cervical   Cervical Flexion 36   Cervical Extension 42   Cervical - Right Side Bend 30   Cervical - Left Side Bend 32   Cervical - Right Rotation 52   Cervical - Left Rotation 50                     [facility] Adult PT Treatment/Exercise - 08/25/16 0001      Neck Exercises: Machines for Strengthening   UBE (Upper Arm Bike) level 4.5 x 6 minutes (3 fwd/3 bwd)     Shoulder Exercises: Standing   Horizontal ABduction Strengthening;Both;15 reps;Theraband   Theraband Level (Shoulder Horizontal ABduction) Level 4 (Blue)   Horizontal ABduction Limitations with scap squeeze   External Rotation Strengthening;Both;15 reps;Theraband  Theraband Level (Shoulder External Rotation) Level 4 (Blue)  with scap squeeze   Extension Strengthening;Both;15 reps;Theraband   Theraband Level (Shoulder Extension) Level 4 (Blue)   Extension Limitations with scap squeeze   Row Strengthening;Both;15 reps;Theraband   Theraband Level (Shoulder Row) Level 4 (Blue)   Row Limitations with csap squeeze     Manual Therapy   Manual Therapy Soft tissue mobilization;Myofascial release;Passive ROM   Soft tissue mobilization STM of B UT, B levator scap, B cervical paraspinals and B suboccipital mm.   Myofascial Release manual trigger point release B UT   Passive ROM PROM of cervical lateral flexion with slihgt over pressure for 1 x 1 minutes each side for UT stretch     Neck Exercises: Stretches   Corner Stretch 3 reps;30 seconds  low/mid/high                PT Education -  08/25/16 1305    Education provided Yes   Education Details HEP review; blue tband given for HEP   Person(s) Educated Patient   Methods Explanation;Demonstration   Comprehension Verbalized understanding;Returned demonstration             PT Long Term Goals - 08/25/16 0805      PT LONG TERM GOAL #1   Title [name] to be independent with advanced HEP (08/31/16)   Status Achieved     PT LONG TERM GOAL #2   Title Patient to improve cervical AROM, primarily lateral flexion and rotation, to symmetrical and WNL (08/31/16)   Status Achieved     PT LONG TERM GOAL #3   Title Patient to demonstrate good postural alignment with ability to self correct for reduced stress placed on neck/upper back musculature (08/31/16)   Status Achieved     PT LONG TERM GOAL #4   Title Patient to report greater ease with head turns during driving and other functional tasks with no increase in pain (08/31/16)   Status Achieved     PT LONG TERM GOAL #5   Title Patient to report pain no greater than 1/10 along with no radicular symptoms for greater than 2 weeks (08/31/16)   Status Partially Met  radicular symptoms "time to time"               Plan - 08/25/16 0805    Clinical Impression Statement Patient doing very well today - reports pain symptoms have greatly improved as well as overall mobility of cervical spine. Patient able to tolerate all postural strengthening and scapular stabilization tasks today with no increase in pain. Patient reporting feeling as if he is able to transfer to HEP at home independently at this time with PT in agreeance as patient has achieved all but 1 goal at this time, with driving still presenting some difficulty with full head turns, which could be a result of prior cervical fusion. Patient to be placed on 30 day hold from PT, with patient verbally understanidng ability to return to PT as needed in that time frame.    PT Treatment/Interventions ADLs/Self Care Home  Management;Cryotherapy;Electrical Stimulation;Moist Heat;Ultrasound;Therapeutic exercise;Therapeutic activities;Patient/family education;Manual techniques;Passive range of motion;Taping;Dry needling   PT Next Visit Plan 30 day hold   Consulted and Agree with Plan of Care Patient      Patient will benefit from skilled therapeutic intervention in order to improve the following deficits and impairments:  Decreased activity tolerance, Decreased range of motion, Increased muscle spasms, Pain  Visit Diagnosis: Cervicalgia  Abnormal posture  Problem List Patient Active Problem List   Diagnosis Date Noted  . Degenerative cervical disc 06/08/2016  . Degenerative lumbar disc 06/08/2016  . Nonallopathic lesion of lumbosacral region 06/08/2016  . Nonallopathic lesion of sacral region 06/08/2016  . Nonallopathic lesion of thoracic region 06/08/2016  . Right leg pain 08/07/2015  . PCP NOTES >>> 04/29/2015  . Rib pain on right side 11/08/2014  . Binocular vision disorder with diplopia 10/01/2014  . Open angle with borderline findings, high risk 10/01/2014  . 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 Herpes zoster-- 09-2014 09/05/2014  . Anal fissure 02/28/2014  . Chronic prostatitis 02/28/2014  . Abnormal prostate specific antigen 02/28/2014  . Acid reflux 02/28/2014  . Ganglion of tendon 02/28/2014  . Incomplete bladder emptying 02/28/2014  . FOM (frequency of micturition) 02/28/2014  . Osteopenia 02/28/2014  . Sesamoiditis 02/28/2014  . Urgency of micturation 02/28/2014  . Rosacea blepharoconjunctivitis 02/12/2014  . Nonexudative age-related macular degeneration 02/12/2014  . Baker's cyst of knee 04/26/2013  . 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 Speech abnormality 02/13/2013  . Raynaud phenomenon 08/16/2012  . Glaucoma suspect 04/19/2012  . Blepharitis 04/19/2012  . Annual physical exam 10/21/2011  . Neck -- back pain-  UDS 09/18/2011  . ALLERGIC RHINITIS 12/19/2009  . PELVIC PAIN, CHRONIC 04/03/2009  . Hypogonadism -- per urology 08/11/2007  . OSTEOPOROSIS, IDIOPATHIC 08/11/2007  . HEADACHE 08/11/2007     Lanney Gins, PT, DPT 08/25/16 1:25 PM  PHYSICAL THERAPY DISCHARGE SUMMARY  Visits from Start of Care: 5  Current functional level related to goals / functional outcomes: See above   Remaining deficits: See above   Education / Equipment: HEP  Plan: Patient agrees to discharge.  Patient goals were met. Patient is being discharged due to meeting the stated rehab goals.  ?????     Lanney Gins, PT, DPT 10/09/16 8:00 AM   [facility] [address]  [place], [place], [address] Phone: [phone]   Fax:  [phone]  Name: Erik [name] MRN: [id number] Date of Birth: 04-29-1956

## 2016-09-02 DIAGNOSIS — H40013 Open angle with borderline findings, low risk, bilateral: Secondary | ICD-10-CM | POA: Diagnosis not present

## 2016-09-02 DIAGNOSIS — H01001 Unspecified blepharitis right upper eyelid: Secondary | ICD-10-CM | POA: Diagnosis not present

## 2016-09-02 DIAGNOSIS — H01004 Unspecified blepharitis left upper eyelid: Secondary | ICD-10-CM | POA: Diagnosis not present

## 2016-09-02 DIAGNOSIS — H04123 Dry eye syndrome of bilateral lacrimal glands: Secondary | ICD-10-CM | POA: Diagnosis not present

## 2016-09-04 MED FILL — GABAPENTIN 300 MG CAPSULE: 300 | 30 days supply | Qty: 30 | Fill #2

## 2016-09-04 MED FILL — RISEDRONATE NA 150 MG TAB: 150 | 30 days supply | Qty: 1 | Fill #7

## 2016-09-09 DIAGNOSIS — N419 Inflammatory disease of prostate, unspecified: Secondary | ICD-10-CM | POA: Diagnosis not present

## 2016-09-15 DIAGNOSIS — N410 Acute prostatitis: Secondary | ICD-10-CM | POA: Diagnosis not present

## 2016-09-24 ENCOUNTER — Other Ambulatory Visit: Payer: Self-pay | Admitting: Internal Medicine

## 2016-09-24 MED FILL — OMEPRAZOLE DR 40 MG CAPSULE: 40 | 90 days supply | Qty: 90 | Fill #0

## 2016-09-29 ENCOUNTER — Other Ambulatory Visit (HOSPITAL_COMMUNITY): Payer: Self-pay | Admitting: Interventional Radiology

## 2016-09-29 ENCOUNTER — Other Ambulatory Visit (HOSPITAL_COMMUNITY): Payer: Self-pay | Admitting: Diagnostic Radiology

## 2016-09-29 DIAGNOSIS — I83811 Varicose veins of right lower extremities with pain: Secondary | ICD-10-CM

## 2016-10-05 MED FILL — RISEDRONATE NA 150 MG TAB: 150 | 30 days supply | Qty: 1 | Fill #8

## 2016-10-06 ENCOUNTER — Encounter: Payer: Self-pay | Admitting: Internal Medicine

## 2016-10-06 ENCOUNTER — Other Ambulatory Visit: Payer: Self-pay | Admitting: Internal Medicine

## 2016-10-06 MED ORDER — SCOPOLAMINE 1 MG/3DAYS TD PT72: 1.0000 | MEDICATED_PATCH | TRANSDERMAL | 0 refills | Status: DC

## 2016-10-06 MED FILL — SCOPOLAMINE 1 MG/3 DAY PATC: 1 | 9 days supply | Qty: 3 | Fill #0

## 2016-10-08 MED FILL — TESTOSTERONE CYP 200 MG/ML: 200 | 84 days supply | Qty: 6 | Fill #0

## 2016-10-26 DIAGNOSIS — N419 Inflammatory disease of prostate, unspecified: Secondary | ICD-10-CM | POA: Diagnosis not present

## 2016-10-27 ENCOUNTER — Ambulatory Visit
Admission: RE | Admit: 2016-10-27 | Discharge: 2016-10-27 | Disposition: A | Discharge status: Home / Self Care | Payer: 59 | Source: Ambulatory Visit | Attending: Interventional Radiology | Admitting: Interventional Radiology

## 2016-10-27 DIAGNOSIS — I82811 Embolism and thrombosis of superficial veins of right lower extremities: Secondary | ICD-10-CM | POA: Diagnosis not present

## 2016-10-27 DIAGNOSIS — I83811 Varicose veins of right lower extremities with pain: Secondary | ICD-10-CM

## 2016-10-27 DIAGNOSIS — I872 Venous insufficiency (chronic) (peripheral): Secondary | ICD-10-CM | POA: Diagnosis not present

## 2016-10-27 NOTE — Progress Notes (Signed)
Patient ID: Erik [name], male   DOB: 1955/11/03, 61 y.o.   MRN: [id number]       Chief Complaint:  6 months follow up after right GSP transcatheter laser occlusion for venous insufficiency and leg pain  Referring Physician(s): Paz  History of Present Illness: Erik [name] is a 61 y.o. male who is 6 months status post right GSP transcatheter laser occlusion for venous insufficiency, leg pain, fatigue and symptomatic varicose veins. He continues to recover very well. He currently is asymptomatic. No residual pain, tenderness, or leg discomfort. He continues to be compliant with knee-high compression stockings and daily exercise/walking. No physical limitations. He continues to work full-time. He is fully recovered. He returns for outpatient follow-up and surveillance ultrasound.  Past Medical History:  Diagnosis Date  . 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 ALLERGIC RHINITIS 12/19/2009  . Back pain chronic    On hydrocodone-Neurontin  . Blepharitis    B, chronic  . BPH (benign prostatic hyperplasia)   . Chronic prostatitis     f/u by urology, has a prostate massage prn  . Groin injury   . HYPOGONADISM, MALE 08/11/2007   f/u by Dr Thomasene Mohair urology    . OSTEOPOROSIS, IDIOPATHIC 08/11/2007   f/u at [facility] , now improved to osteopenia  . Raynaud's disease /phenomenon 08/16/2012  . Rosacea   . Shingles 09-2014  . Varicose veins     Past Surgical History:  Procedure Laterality Date  . BLEPHAROPLASTY     B blepharoplasty @ [facility] 08-2011  . CERVICAL FUSION  1994   C4-C5-C6 fusion  . IR GENERIC HISTORICAL  03/17/2016   IR EMBO VENOUS NOT HEMORR HEMANG  INC GUIDE ROADMAPPING 03/17/2016 Greggory Keen, MD [facility] ULTRASOUND  . TRANSURETHRAL RESECTION OF PROSTATE  07-2009    Allergies: Sulfa drugs cross reactors  Medications: Prior to Admission medications   Medication Sig Start Date End Date Taking? Authorizing Provider  allopurinol (ZYLOPRIM) 100 MG tablet Take 1 tablet  (100 mg total) by mouth daily. 06/23/16   Lyndal Pulley, DO  amLODipine (NORVASC) 5 MG tablet Take 5 mg by mouth daily. Prn Reynaud's    Historical Provider, MD  azelastine (ASTELIN) 0.1 % nasal spray Place 2 sprays into both nostrils daily. 05/26/16   Colon Branch, MD  Fish Oil-Cholecalciferol (FISH OIL + D3) 1000-1000 MG-UNIT CAPS Take 1,000 mg by mouth.    Historical Provider, MD  gabapentin (NEURONTIN) 300 MG capsule Take 1 capsule (300 mg total) by mouth at bedtime. 06/23/16   Lyndal Pulley, DO  loratadine (CLARITIN) 10 MG tablet Take 10 mg by mouth daily.    Historical Provider, MD  Multiple Vitamin (MULTI-VITAMINS) TABS Take by mouth.    Historical Provider, MD  naproxen (NAPROSYN) 250 MG tablet Take by mouth 2 (two) times daily with a meal. Reported on 08/02/2015    Historical Provider, MD  omeprazole (PRILOSEC) 40 MG capsule Take 1 capsule (40 mg total) by mouth daily. 09/24/16   Colon Branch, MD  risedronate (ACTONEL) 150 MG tablet Take 150 mg by mouth every 30 (thirty) days.  01/23/14   Historical Provider, MD  scopolamine (TRANSDERM-SCOP, 1.5 MG,) 1 MG/3DAYS Place 1 patch (1.5 mg total) onto the skin every 3 (three) days. 10/06/16   Colon Branch, MD  tadalafil (CIALIS) 5 MG tablet Take 5 mg by mouth daily.     Historical Provider, MD  testosterone cypionate (DEPOTESTOTERONE CYPIONATE) 100 MG/ML injection Inject into the muscle every  14 (fourteen) days. For IM use only    Historical Provider, MD  Vitamin D, Ergocalciferol, (DRISDOL) 50000 units CAPS capsule Take 1 capsule (50,000 Units total) by mouth every 7 (seven) days. 06/08/16   Lyndal Pulley, DO     Family History  Problem Relation Age of Onset  . COPD Father     Died, 71  . Hypertension Father   . Stroke Father   . CAD Father   . Diabetes Mother   . Hypertension Mother   . CAD Mother        . Colon polyps Mother   . Cancer Mother     Ovarian cancer  . Hypertension Sister   . Fibromyalgia Sister   . Hyperlipidemia Sister     . Colon cancer Maternal Aunt   . Prostate cancer Neg Hx     Social History   Social History  . Marital status: Married    Spouse name: N/A  . Number of children: 3  . Years of education: N/A   Occupational History  . software trainer      Social History Main Topics  . Smoking status: Never Smoker  . Smokeless tobacco: Never Used  . Alcohol use No  . Drug use: No  . Sexual activity: Not on file   Other Topics Concern  . Not on file   Social History Narrative   Lives with wife.  They have three children.   Daughter is an Ship broker        Review of Systems: A 12 point ROS discussed and pertinent positives are indicated in the HPI above.  All other systems are negative.  Review of Systems  Vital Signs: There were no vitals taken for this visit.  Physical Exam  Constitutional: He appears well-developed and well-nourished. No distress.  Musculoskeletal: Normal range of motion. He exhibits no edema or tenderness.  No skin lesions or signs of cellulitis or phlebitis. Both lower extremity demonstrate minor residual calf dilated reticular veins and scattered spider veins but no significant varicosities.  Skin: He is not diaphoretic.     Imaging:  Today's ultrasound is negative for DVT. Right GSV treated segment remains occluded with evidence of retraction and scarring. Minor calf region dilated reticular veins without significant varicosity. No phlebitis.   Labs:  CBC: No results for input(s): WBC, HGB, HCT, PLT in the last 8760 hours.  COAGS: No results for input(s): INR, APTT in the last 8760 hours.  BMP:  Recent Labs  05/26/16 0839  NA 141  K 4.4  CL 107  CO2 29  GLUCOSE 107*  BUN 21  CALCIUM 8.9  CREATININE 0.82    LIVER FUNCTION TESTS: No results for input(s): BILITOT, AST, ALT, ALKPHOS, PROT, ALBUMIN in the last 8760 hours.   Assessment and Plan:  6 months status post right GS V transcatheter laser occlusion for venous insufficiency, leg  pain and varicose veins. Treated segment is occluded at 6 months. Overall he has recovered. Symptoms have resolved. No current leg pain or limitation.  Plan: Continue knee-high prescription strength compression stockings as much as possible. Daily walking and exercise. Follow-up as needed for any recurrent symptoms or developing varicosities. He is in agreement with this plan.     Electronically Signed: Greggory Keen 10/27/2016, 9:08 AM   I spent a total of    15 Minutes in face to face in clinical consultation, greater than 50% of which was counseling/coordinating care for this patient with venous insufficiency and varicose  veins.

## 2016-11-02 MED FILL — RISEDRONATE NA 150 MG TAB: 150 | 30 days supply | Qty: 1 | Fill #9

## 2016-11-12 ENCOUNTER — Encounter: Payer: Self-pay | Admitting: Family Medicine

## 2016-11-20 DIAGNOSIS — M9902 Segmental and somatic dysfunction of thoracic region: Secondary | ICD-10-CM | POA: Diagnosis not present

## 2016-11-20 DIAGNOSIS — M9903 Segmental and somatic dysfunction of lumbar region: Secondary | ICD-10-CM | POA: Diagnosis not present

## 2016-11-20 DIAGNOSIS — M9905 Segmental and somatic dysfunction of pelvic region: Secondary | ICD-10-CM | POA: Diagnosis not present

## 2016-11-20 DIAGNOSIS — M5432 Sciatica, left side: Secondary | ICD-10-CM | POA: Diagnosis not present

## 2016-11-21 DIAGNOSIS — M9902 Segmental and somatic dysfunction of thoracic region: Secondary | ICD-10-CM | POA: Diagnosis not present

## 2016-11-21 DIAGNOSIS — M9905 Segmental and somatic dysfunction of pelvic region: Secondary | ICD-10-CM | POA: Diagnosis not present

## 2016-11-21 DIAGNOSIS — M9903 Segmental and somatic dysfunction of lumbar region: Secondary | ICD-10-CM | POA: Diagnosis not present

## 2016-11-21 DIAGNOSIS — M5432 Sciatica, left side: Secondary | ICD-10-CM | POA: Diagnosis not present

## 2016-11-23 DIAGNOSIS — M9905 Segmental and somatic dysfunction of pelvic region: Secondary | ICD-10-CM | POA: Diagnosis not present

## 2016-11-23 DIAGNOSIS — M9902 Segmental and somatic dysfunction of thoracic region: Secondary | ICD-10-CM | POA: Diagnosis not present

## 2016-11-23 DIAGNOSIS — M5432 Sciatica, left side: Secondary | ICD-10-CM | POA: Diagnosis not present

## 2016-11-23 DIAGNOSIS — M9903 Segmental and somatic dysfunction of lumbar region: Secondary | ICD-10-CM | POA: Diagnosis not present

## 2016-11-26 ENCOUNTER — Ambulatory Visit (INDEPENDENT_AMBULATORY_CARE_PROVIDER_SITE_OTHER): Payer: 59 | Admitting: Family Medicine

## 2016-11-26 ENCOUNTER — Encounter: Payer: Self-pay | Admitting: Family Medicine

## 2016-11-26 VITALS — BP 126/82 | HR 80 | Ht 72.0 in | Wt 211.0 lb

## 2016-11-26 DIAGNOSIS — M9902 Segmental and somatic dysfunction of thoracic region: Secondary | ICD-10-CM | POA: Diagnosis not present

## 2016-11-26 DIAGNOSIS — M9903 Segmental and somatic dysfunction of lumbar region: Secondary | ICD-10-CM | POA: Diagnosis not present

## 2016-11-26 DIAGNOSIS — M5136 Other intervertebral disc degeneration, lumbar region: Secondary | ICD-10-CM | POA: Diagnosis not present

## 2016-11-26 DIAGNOSIS — M9905 Segmental and somatic dysfunction of pelvic region: Secondary | ICD-10-CM | POA: Diagnosis not present

## 2016-11-26 DIAGNOSIS — M5432 Sciatica, left side: Secondary | ICD-10-CM | POA: Diagnosis not present

## 2016-11-26 MED ORDER — CYCLOBENZAPRINE HCL 10 MG PO TABS: 10.0000 mg | ORAL_TABLET | Freq: Three times a day (TID) | ORAL | 0 refills | Status: DC | PRN

## 2016-11-26 MED ORDER — METHYLPREDNISOLONE ACETATE 80 MG/ML IJ SUSP
80.0000 mg | Freq: Once | INTRAMUSCULAR | Status: AC
  Administered 2016-11-26: 80 mg via INTRAMUSCULAR

## 2016-11-26 MED ORDER — GABAPENTIN 300 MG PO CAPS: 300.0000 mg | ORAL_CAPSULE | Freq: Every day | ORAL | 3 refills | Status: DC

## 2016-11-26 MED ORDER — KETOROLAC TROMETHAMINE 60 MG/2ML IM SOLN
60.0000 mg | Freq: Once | INTRAMUSCULAR | Status: AC
  Administered 2016-11-26: 60 mg via INTRAMUSCULAR

## 2016-11-26 MED ORDER — TRAMADOL HCL 50 MG PO TABS: 50.0000 mg | ORAL_TABLET | Freq: Three times a day (TID) | ORAL | 0 refills | Status: DC | PRN

## 2016-11-26 MED FILL — GABAPENTIN 300 MG CAPSULE: 300 | 30 days supply | Qty: 30 | Fill #0

## 2016-11-26 MED FILL — CYCLOBENZAPRINE 10 MG TAB: 10 | 10 days supply | Qty: 30 | Fill #0

## 2016-11-26 NOTE — Patient Instructions (Addendum)
Good to see you  2 injections today  Starting tomorrow take prednisone daily for 5 days.  Gabapentin and flexeril refilled.  Send me a message in 1 week and if not better we will need to consider epidurals.

## 2016-11-26 NOTE — Progress Notes (Signed)
[name] [address] [place], [place] [address] Phone: [phone] Subjective:    I'm seeing this patient by the request  of:  Colon Branch, MD   CC: Neck pain f/u Low back pain follow-up  [id number]  Erik [name] is a 61 y.o. male coming in with complaint of neck pain. Patient's past medical history denies significant for cervical fusion. Patient does have known chronic back pain and is taking hydrocodone as prescribed by another provider.Radiation going down the arm. 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 Patient was started on prednisone, gabapentin and once weekly vitamin D. Patient initially did better on the prednisone but then was having worsening pain. Was having some weakness of the upper extremity. Sent for an MRI of the cervical spine. Patient's cervical spine did show the patient had adjacent segment disease and C3 on C4 as well as C6-C7.States that the neck seems to be doing relatively well.  Patient was also having worsening lumbar radiculopathy and pain. Was sent for an MRI of the lower back. This isn't apparently visualized by me. Showed very mild progression of degenerative changes at L4-L5 and L5-S1.  patient had been going intermittently into physical therapy for one month in February. Patient states that this as well as the Flexeril has been seem to be the most improving. Patient ran out of this and unfortunately is now having radicular symptoms. Patient states the pain in the back as severe as well. Seems to be unrelenting. Now having constant radiation down the whole right leg. Mild weakness. Denies any bowel or bladder incontinence. Patient states even the chronic hydrocodone is not improving.  Past Medical History:  Diagnosis Date  . 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 ALLERGIC RHINITIS 12/19/2009  . Back pain chronic    On hydrocodone-Neurontin  . Blepharitis    B,  chronic  . BPH (benign prostatic hyperplasia)   . Chronic prostatitis     f/u by urology, has a prostate massage prn  . Groin injury   . HYPOGONADISM, MALE 08/11/2007   f/u by Dr Thomasene Mohair urology    . OSTEOPOROSIS, IDIOPATHIC 08/11/2007   f/u at [facility] , now improved to osteopenia  . Raynaud's disease /phenomenon 08/16/2012  . Rosacea   . Shingles 09-2014  . Varicose veins    Past Surgical History:  Procedure Laterality Date  . BLEPHAROPLASTY     B blepharoplasty @ [facility] 08-2011  . CERVICAL FUSION  1994   C4-C5-C6 fusion  . IR GENERIC HISTORICAL  03/17/2016   IR EMBO VENOUS NOT HEMORR HEMANG  INC GUIDE ROADMAPPING 03/17/2016 Greggory Keen, MD [facility]  . TRANSURETHRAL RESECTION OF PROSTATE  07-2009   Social History   Social History  . Marital status: Married    Spouse name: N/A  . Number of children: 3  . Years of education: N/A   Occupational History  . software trainer      Social History Main Topics  . Smoking status: Never Smoker  . Smokeless tobacco: Never Used  . Alcohol use No  . Drug use: No  . Sexual activity: Not Asked   Other Topics Concern  . None   Social History Narrative   Lives with wife.  They have three children.   Daughter is an Ship broker     Allergies  Allergen Reactions  . Sulfa Drugs Cross Reactors Nausea And Vomiting   Family History  Problem Relation  Age of Onset  . COPD Father        Died, 71  . Hypertension Father   . Stroke Father   . CAD Father   . Diabetes Mother   . Hypertension Mother   . CAD Mother           . Colon polyps Mother   . Cancer Mother        Ovarian cancer  . Hypertension Sister   . Fibromyalgia Sister   . Hyperlipidemia Sister   . Colon cancer Maternal Aunt   . Prostate cancer Neg Hx     Past medical history, social, surgical and family history all reviewed in electronic medical record.  No pertanent information unless stated regarding to the chief complaint.   Review of Systems: No headache,  visual changes, nausea, vomiting, diarrhea, constipation, dizziness, abdominal pain, skin rash, fevers, chills, night sweats, weight loss, swollen lymph nodes, body aches, joint swelling,  chest pain, shortness of breath, mood  Positive muscle aches    Objective  Blood pressure 126/82, pulse 80, height 6' (1.829 m), weight 211 lb (95.7 kg), SpO2 94 %.   Systems examined below as of 11/26/16 General: NAD A&O x3 mood, affect normal  HEENT: Pupils equal, extraocular movements intact no nystagmus Respiratory: not short of breath at rest or with speaking Cardiovascular: No lower extremity edema, non tender Skin: Warm dry intact with no signs of infection or rash on extremities or on axial skeleton. Abdomen: Soft nontender, no masses Neuro: Cranial nerves  intact, neurovascularly intact in all extremities with 2+ DTRs and 2+ pulses. Lymph: No lymphadenopathy appreciated today  Gait normal with good balance and coordination.  MSK: Non tender with full range of motion and good stability and symmetric strength and tone of shoulders, elbows, wrist,  knee hips and ankles bilaterally.   Neck: Inspection unremarkable. No palpable stepoffs. Negative Spurling's maneuver. Mild limitation in extension and flexion Grip strength and sensation normal in bilateral hands Strength good C4 to T1 distribution No sensory change to C4 to T1 Negative Hoffman sign bilaterally Reflexes normal  Back Exam:  Inspection: Unremarkable  Motion: Flexion 25 deg worsening pain. Extension 15 deg, Side Bending to 45 deg bilaterally,  Rotation to 45 deg bilaterally  SLR laying: Severely positive right side XSLR laying: Positive with radicular symptoms going down the right leg Palpable tenderness: Severely tender to palpation in the L5-S1 area of the lumbar spine.Marland Kitchen FABER: Increasing tightness on the right side. Sensory change: Gross sensation intact to all lumbar and sacral dermatomes.  Reflexes: 2+ at both patellar  tendons, 2+ at achilles tendons, Babinski's downgoing.  Strength at foot  4 out of 5 strength of the right lower extremity compared to the contralateral side dorsiflexion otherwise symmetric     Impression and Recommendations:     This case required medical decision making of moderate complexity.      Note: This dictation was prepared with Dragon dictation along with smaller phrase technology. Any transcriptional errors that result from this process are unintentional.

## 2016-11-26 NOTE — Assessment & Plan Note (Signed)
Worsening symptoms with patient having radicular symptoms as well. Mild weakness of the lower extremity. Patient will be given prednisone at this time. Refilled also muscle relaxer as well as gabapentin. No improvement in the next 96 hours patient can be referred for epidural. Patient is in agreement with the plan. Otherwise will follow-up with me again 1-2 weeks to start physical therapy.

## 2016-11-27 MED FILL — traMADol HCL 50 MG TABS: 50 | 10 days supply | Qty: 30 | Fill #0

## 2016-11-29 ENCOUNTER — Encounter: Payer: Self-pay | Admitting: Family Medicine

## 2016-12-01 ENCOUNTER — Encounter: Payer: Self-pay | Admitting: Internal Medicine

## 2016-12-02 MED FILL — RISEDRONATE NA 150 MG TAB: 150 | 30 days supply | Qty: 1 | Fill #10

## 2016-12-04 DIAGNOSIS — M9903 Segmental and somatic dysfunction of lumbar region: Secondary | ICD-10-CM | POA: Diagnosis not present

## 2016-12-04 DIAGNOSIS — M9905 Segmental and somatic dysfunction of pelvic region: Secondary | ICD-10-CM | POA: Diagnosis not present

## 2016-12-04 DIAGNOSIS — M5432 Sciatica, left side: Secondary | ICD-10-CM | POA: Diagnosis not present

## 2016-12-04 DIAGNOSIS — M9902 Segmental and somatic dysfunction of thoracic region: Secondary | ICD-10-CM | POA: Diagnosis not present

## 2016-12-07 DIAGNOSIS — N419 Inflammatory disease of prostate, unspecified: Secondary | ICD-10-CM | POA: Diagnosis not present

## 2016-12-25 MED FILL — TESTOSTERONE CYP 200 MG/ML: 200 | 84 days supply | Qty: 6 | Fill #1

## 2016-12-30 MED FILL — OMEPRAZOLE DR 40 MG CAPSULE: 40 | 90 days supply | Qty: 90 | Fill #1

## 2017-01-04 MED FILL — RISEDRONATE NA 150 MG TAB: 150 | 30 days supply | Qty: 1 | Fill #11

## 2017-01-04 MED FILL — GABAPENTIN 300 MG CAPSULE: 300 | 30 days supply | Qty: 30 | Fill #1

## 2017-01-11 DIAGNOSIS — H01001 Unspecified blepharitis right upper eyelid: Secondary | ICD-10-CM | POA: Diagnosis not present

## 2017-01-11 DIAGNOSIS — H353121 Nonexudative age-related macular degeneration, left eye, early dry stage: Secondary | ICD-10-CM | POA: Diagnosis not present

## 2017-01-11 DIAGNOSIS — H04123 Dry eye syndrome of bilateral lacrimal glands: Secondary | ICD-10-CM | POA: Diagnosis not present

## 2017-01-11 DIAGNOSIS — H2513 Age-related nuclear cataract, bilateral: Secondary | ICD-10-CM | POA: Diagnosis not present

## 2017-01-11 DIAGNOSIS — H353112 Nonexudative age-related macular degeneration, right eye, intermediate dry stage: Secondary | ICD-10-CM | POA: Diagnosis not present

## 2017-01-11 DIAGNOSIS — H40013 Open angle with borderline findings, low risk, bilateral: Secondary | ICD-10-CM | POA: Diagnosis not present

## 2017-01-11 DIAGNOSIS — H01004 Unspecified blepharitis left upper eyelid: Secondary | ICD-10-CM | POA: Diagnosis not present

## 2017-01-12 DIAGNOSIS — M5432 Sciatica, left side: Secondary | ICD-10-CM | POA: Diagnosis not present

## 2017-01-12 DIAGNOSIS — M9902 Segmental and somatic dysfunction of thoracic region: Secondary | ICD-10-CM | POA: Diagnosis not present

## 2017-01-12 DIAGNOSIS — M9905 Segmental and somatic dysfunction of pelvic region: Secondary | ICD-10-CM | POA: Diagnosis not present

## 2017-01-12 DIAGNOSIS — M9903 Segmental and somatic dysfunction of lumbar region: Secondary | ICD-10-CM | POA: Diagnosis not present

## 2017-01-15 DIAGNOSIS — M9902 Segmental and somatic dysfunction of thoracic region: Secondary | ICD-10-CM | POA: Diagnosis not present

## 2017-01-15 DIAGNOSIS — M9903 Segmental and somatic dysfunction of lumbar region: Secondary | ICD-10-CM | POA: Diagnosis not present

## 2017-01-15 DIAGNOSIS — M9905 Segmental and somatic dysfunction of pelvic region: Secondary | ICD-10-CM | POA: Diagnosis not present

## 2017-01-15 DIAGNOSIS — M5432 Sciatica, left side: Secondary | ICD-10-CM | POA: Diagnosis not present

## 2017-01-19 DIAGNOSIS — N419 Inflammatory disease of prostate, unspecified: Secondary | ICD-10-CM | POA: Diagnosis not present

## 2017-02-09 ENCOUNTER — Encounter: Payer: Self-pay | Admitting: Internal Medicine

## 2017-02-09 DIAGNOSIS — I7300 Raynaud's syndrome without gangrene: Secondary | ICD-10-CM

## 2017-02-09 DIAGNOSIS — R2242 Localized swelling, mass and lump, left lower limb: Secondary | ICD-10-CM

## 2017-02-10 NOTE — Telephone Encounter (Signed)
The patient's message  Received: Today  Message Contents  Colon Branch, MD  Damita Dunnings, [place]        Refer to rheumatology, Davenshwar, he has the records from Dr. Charlestine Night, dx Raynaud's .  Refer to [facility] orthopedics: DX foot mass.

## 2017-02-10 NOTE — Telephone Encounter (Signed)
Dr. Cy Blamer is located at [facility]ology, referral placed. Ortho referral placed for L foot mass.

## 2017-02-12 MED FILL — GABAPENTIN 300 MG CAPSULE: 300 | 30 days supply | Qty: 30 | Fill #2

## 2017-02-23 ENCOUNTER — Ambulatory Visit (INDEPENDENT_AMBULATORY_CARE_PROVIDER_SITE_OTHER): Payer: 59 | Admitting: Orthopaedic Surgery

## 2017-02-23 ENCOUNTER — Ambulatory Visit (INDEPENDENT_AMBULATORY_CARE_PROVIDER_SITE_OTHER): Payer: 59

## 2017-02-23 DIAGNOSIS — M79671 Pain in right foot: Secondary | ICD-10-CM | POA: Diagnosis not present

## 2017-02-23 DIAGNOSIS — M79672 Pain in left foot: Secondary | ICD-10-CM | POA: Diagnosis not present

## 2017-02-23 NOTE — Progress Notes (Signed)
Office Visit Note   Patient: Erik [name]           Date of Birth: [birth year]           MRN: [id number] Visit Date: 02/23/2017              Requested by: Colon Branch, [address] [place], [place] [address] PCP: Colon Branch, MD   Assessment & Plan: Visit Diagnoses:  1. Pain in left foot   2. Pain in right foot     Plan: MRI of bilateral feet to rule out stress fractures given history of osteoporosis and worsening with running. Follow-up after the MRI.  Follow-Up Instructions: Return in about 2 weeks (around 03/09/2017).   Orders:  Orders Placed This Encounter  Procedures  . XR Foot Complete Left  . MR Foot Left w/o contrast  . MR Foot Right w/o contrast   No orders of the defined types were placed in this encounter.     Procedures: No procedures performed   Clinical Data: No additional findings.   Subjective: Chief Complaint  Patient presents with  . Left Foot - Pain, Cyst    Patient is a 61 year old gentleman comes in with bilateral first ray and sesamoid pain for a year and a half. He previously had a "cyst" removed by a podiatrist in [facility] 2012. He is unsure of the formal diagnosis for the pathologic findings of the cyst that was removed. He denies any constitutional symptoms. He feels that the pain is correlated with increase activity. He denies any constitutional symptoms. He denies any injuries. He is trying to get back into running and he does have a history of osteoporosis and just finished a course of high-dose Actonel    Review of Systems  Constitutional: Negative.   All other systems reviewed and are negative.    Objective: Vital Signs: There were no vitals taken for this visit.  Physical Exam  Constitutional: He is oriented to person, place, and time. He appears well-developed and well-nourished.  HENT:  Head: Normocephalic and atraumatic.  Eyes: Pupils are equal, round, and reactive to light.  Neck: Neck supple.    Pulmonary/Chest: Effort normal.  Abdominal: Soft.  Musculoskeletal: Normal range of motion.  Neurological: He is alert and oriented to person, place, and time.  Skin: Skin is warm.  Psychiatric: He has a normal mood and affect. His behavior is normal. Judgment and thought content normal.  Nursing note and vitals reviewed.   Ortho Exam Bilateral foot exam is very tender over the first ray just proximal to the first metatarsal head. He is also very tender over the sesamoids. He has no pain with range of motion of the MTP joint. No signs of infection or swelling. Specialty Comments:  No specialty comments available.  Imaging: Xr Foot Complete Left  Result Date: 02/23/2017 No acute structural findings    PMFS History: Patient Active Problem List   Diagnosis Date Noted  . Degenerative cervical disc 06/08/2016  . Degenerative lumbar disc 06/08/2016  . Nonallopathic lesion of lumbosacral region 06/08/2016  . Nonallopathic lesion of sacral region 06/08/2016  . Nonallopathic lesion of thoracic region 06/08/2016  . Right leg pain 08/07/2015  . PCP NOTES >>> 04/29/2015  . Rib pain on right side 11/08/2014  . Binocular vision disorder with diplopia 10/01/2014  . Open angle with borderline findings, high risk 10/01/2014  . 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 Herpes zoster-- 09-2014 09/05/2014  . Anal fissure 02/28/2014  . Chronic prostatitis 02/28/2014  . Abnormal prostate specific antigen 02/28/2014  . Acid reflux 02/28/2014  . Ganglion of tendon 02/28/2014  . Incomplete bladder emptying 02/28/2014  . FOM (frequency of micturition) 02/28/2014  . Osteopenia 02/28/2014  . Sesamoiditis 02/28/2014  . Urgency of micturation 02/28/2014  . Rosacea blepharoconjunctivitis 02/12/2014  . Nonexudative age-related macular degeneration 02/12/2014  . Baker's cyst of knee 04/26/2013  . 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 Speech  abnormality 02/13/2013  . Raynaud phenomenon 08/16/2012  . Glaucoma suspect 04/19/2012  . Blepharitis 04/19/2012  . Annual physical exam 10/21/2011  . Neck -- back pain- UDS 09/18/2011  . ALLERGIC RHINITIS 12/19/2009  . PELVIC PAIN, CHRONIC 04/03/2009  . Hypogonadism -- per urology 08/11/2007  . OSTEOPOROSIS, IDIOPATHIC 08/11/2007  . HEADACHE 08/11/2007   Past Medical History:  Diagnosis Date  . 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 ALLERGIC RHINITIS 12/19/2009  . Back pain chronic    On hydrocodone-Neurontin  . Blepharitis    B, chronic  . BPH (benign prostatic hyperplasia)   . Chronic prostatitis     f/u by urology, has a prostate massage prn  . Groin injury   . HYPOGONADISM, MALE 08/11/2007   f/u by Dr Thomasene Mohair urology    . OSTEOPOROSIS, IDIOPATHIC 08/11/2007   f/u at [facility] , now improved to osteopenia  . Raynaud's disease /phenomenon 08/16/2012  . Rosacea   . Shingles 09-2014  . Varicose veins     Family History  Problem Relation Age of Onset  . COPD Father        Died, 71  . Hypertension Father   . Stroke Father   . CAD Father   . Diabetes Mother   . Hypertension Mother   . CAD Mother           . Colon polyps Mother   . Cancer Mother        Ovarian cancer  . Hypertension Sister   . Fibromyalgia Sister   . Hyperlipidemia Sister   . Colon cancer Maternal Aunt   . Prostate cancer Neg Hx     Past Surgical History:  Procedure Laterality Date  . BLEPHAROPLASTY     B blepharoplasty @ [facility] 08-2011  . CERVICAL FUSION  1994   C4-C5-C6 fusion  . IR GENERIC HISTORICAL  03/17/2016   IR EMBO VENOUS NOT HEMORR HEMANG  INC GUIDE ROADMAPPING 03/17/2016 Greggory Keen, MD [facility]  . TRANSURETHRAL RESECTION OF PROSTATE  07-2009   Social History   Occupational History  . software trainer      Social History Main Topics  . Smoking status: Never Smoker  . Smokeless tobacco: Never Used  . Alcohol use No  . Drug use: No  . Sexual activity: Not on file

## 2017-02-25 DIAGNOSIS — N419 Inflammatory disease of prostate, unspecified: Secondary | ICD-10-CM | POA: Diagnosis not present

## 2017-02-25 DIAGNOSIS — M818 Other osteoporosis without current pathological fracture: Secondary | ICD-10-CM | POA: Diagnosis not present

## 2017-02-25 MED FILL — CIPROFLOXACIN HCL 500 MG TA: 500 | 21 days supply | Qty: 42 | Fill #0

## 2017-02-26 DIAGNOSIS — M9905 Segmental and somatic dysfunction of pelvic region: Secondary | ICD-10-CM | POA: Diagnosis not present

## 2017-02-26 DIAGNOSIS — M9902 Segmental and somatic dysfunction of thoracic region: Secondary | ICD-10-CM | POA: Diagnosis not present

## 2017-02-26 DIAGNOSIS — M5432 Sciatica, left side: Secondary | ICD-10-CM | POA: Diagnosis not present

## 2017-02-26 DIAGNOSIS — M9903 Segmental and somatic dysfunction of lumbar region: Secondary | ICD-10-CM | POA: Diagnosis not present

## 2017-03-02 DIAGNOSIS — M9902 Segmental and somatic dysfunction of thoracic region: Secondary | ICD-10-CM | POA: Diagnosis not present

## 2017-03-02 DIAGNOSIS — M9905 Segmental and somatic dysfunction of pelvic region: Secondary | ICD-10-CM | POA: Diagnosis not present

## 2017-03-02 DIAGNOSIS — M9903 Segmental and somatic dysfunction of lumbar region: Secondary | ICD-10-CM | POA: Diagnosis not present

## 2017-03-02 DIAGNOSIS — M5432 Sciatica, left side: Secondary | ICD-10-CM | POA: Diagnosis not present

## 2017-03-09 ENCOUNTER — Ambulatory Visit
Admission: RE | Admit: 2017-03-09 | Discharge: 2017-03-09 | Disposition: A | Discharge status: Home / Self Care | Payer: 59 | Source: Ambulatory Visit | Attending: Orthopaedic Surgery | Admitting: Orthopaedic Surgery

## 2017-03-09 DIAGNOSIS — M79672 Pain in left foot: Secondary | ICD-10-CM

## 2017-03-09 DIAGNOSIS — M79671 Pain in right foot: Secondary | ICD-10-CM

## 2017-03-09 DIAGNOSIS — R600 Localized edema: Secondary | ICD-10-CM | POA: Diagnosis not present

## 2017-03-16 ENCOUNTER — Encounter (INDEPENDENT_AMBULATORY_CARE_PROVIDER_SITE_OTHER): Payer: Self-pay | Admitting: Orthopaedic Surgery

## 2017-03-16 ENCOUNTER — Ambulatory Visit (INDEPENDENT_AMBULATORY_CARE_PROVIDER_SITE_OTHER): Payer: 59 | Admitting: Orthopaedic Surgery

## 2017-03-16 DIAGNOSIS — M2580 Other specified joint disorders, unspecified joint: Secondary | ICD-10-CM | POA: Diagnosis not present

## 2017-03-16 MED FILL — GABAPENTIN 300 MG CAPSULE: 300 | 30 days supply | Qty: 30 | Fill #3

## 2017-03-16 MED FILL — ALLOPURINOL 100 MG TABLET: 100 | 30 days supply | Qty: 30 | Fill #1

## 2017-03-16 NOTE — Progress Notes (Signed)
Office Visit Note   Patient: Erik [name]           Date of Birth: [birth year]           MRN: [id number] Visit Date: 03/16/2017              Requested by: Colon Branch, [address] [place], [place] [address] PCP: Colon Branch, MD   Assessment & Plan: Visit Diagnoses:  1. Sesamoiditis     Plan: MRI of the left foot shows a stress fracture of the medial sesamoid and MRI of the right foot shows medial sesamoiditis.  I recommended relative rest and abstaining from running. Prescription to [facility] for orthotic possible metatarsal bar. Recommend low-impact activities such as swimming and bicycling. Questions encouraged and answered. Follow-up as needed.  Follow-Up Instructions: Return if symptoms worsen or fail to improve.   Orders:  No orders of the defined types were placed in this encounter.  No orders of the defined types were placed in this encounter.     Procedures: No procedures performed   Clinical Data: No additional findings.   Subjective: Chief Complaint  Patient presents with  . Results    mri RESULTS    Patient comes in today to review his MRI of both of his feet. He states he is relatively unchanged in terms of symptoms. Occasionally he has some stinging pain in his feet with prolonged standing and walking.    Review of Systems   Objective: Vital Signs: There were no vitals taken for this visit.  Physical Exam  Ortho Exam Exam is stable. Specialty Comments:  No specialty comments available.  Imaging: No results found.   PMFS History: Patient Active Problem List   Diagnosis Date Noted  . Degenerative cervical disc 06/08/2016  . Degenerative lumbar disc 06/08/2016  . Nonallopathic lesion of lumbosacral region 06/08/2016  . Nonallopathic lesion of sacral region 06/08/2016  . Nonallopathic lesion of thoracic region 06/08/2016  . Right leg pain 08/07/2015  . PCP NOTES >>> 04/29/2015  . Rib pain on right side 11/08/2014   . Binocular vision disorder with diplopia 10/01/2014  . Open angle with borderline findings, high risk 10/01/2014  . 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 Herpes zoster-- 09-2014 09/05/2014  . Anal fissure 02/28/2014  . Chronic prostatitis 02/28/2014  . Abnormal prostate specific antigen 02/28/2014  . Acid reflux 02/28/2014  . Ganglion of tendon 02/28/2014  . Incomplete bladder emptying 02/28/2014  . FOM (frequency of micturition) 02/28/2014  . Osteopenia 02/28/2014  . Sesamoiditis 02/28/2014  . Urgency of micturation 02/28/2014  . Rosacea blepharoconjunctivitis 02/12/2014  . Nonexudative age-related macular degeneration 02/12/2014  . Baker's cyst of knee 04/26/2013  . 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 Speech abnormality 02/13/2013  . Raynaud phenomenon 08/16/2012  . Glaucoma suspect 04/19/2012  . Blepharitis 04/19/2012  . Annual physical exam 10/21/2011  . Neck -- back pain- UDS 09/18/2011  . ALLERGIC RHINITIS 12/19/2009  . PELVIC PAIN, CHRONIC 04/03/2009  . Hypogonadism -- per urology 08/11/2007  . OSTEOPOROSIS, IDIOPATHIC 08/11/2007  . HEADACHE 08/11/2007   Past Medical History:  Diagnosis Date  . 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 ALLERGIC RHINITIS 12/19/2009  . Back pain chronic    On hydrocodone-Neurontin  . Blepharitis    B, chronic  . BPH (benign prostatic hyperplasia)   . Chronic prostatitis     f/u by urology,  has a prostate massage prn  . Groin injury   . HYPOGONADISM, MALE 08/11/2007   f/u by Dr Thomasene Mohair urology    . OSTEOPOROSIS, IDIOPATHIC 08/11/2007   f/u at [facility] , now improved to osteopenia  . Raynaud's disease /phenomenon 08/16/2012  . Rosacea   . Shingles 09-2014  . Varicose veins     Family History  Problem Relation Age of Onset  . COPD Father        Died, 71  . Hypertension Father   . Stroke Father   . CAD Father   . Diabetes Mother   . Hypertension Mother   . CAD  Mother           . Colon polyps Mother   . Cancer Mother        Ovarian cancer  . Hypertension Sister   . Fibromyalgia Sister   . Hyperlipidemia Sister   . Colon cancer Maternal Aunt   . Prostate cancer Neg Hx     Past Surgical History:  Procedure Laterality Date  . BLEPHAROPLASTY     B blepharoplasty @ [facility] 08-2011  . CERVICAL FUSION  1994   C4-C5-C6 fusion  . IR GENERIC HISTORICAL  03/17/2016   IR EMBO VENOUS NOT HEMORR HEMANG  INC GUIDE ROADMAPPING 03/17/2016 Greggory Keen, MD [facility]  . TRANSURETHRAL RESECTION OF PROSTATE  07-2009   Social History   Occupational History  . software trainer      Social History Main Topics  . Smoking status: Never Smoker  . Smokeless tobacco: Never Used  . Alcohol use No  . Drug use: No  . Sexual activity: Not on file

## 2017-03-23 MED FILL — TESTOSTERONE CYP 200 MG/ML: 200 | 84 days supply | Qty: 6 | Fill #0

## 2017-03-24 NOTE — Progress Notes (Signed)
[name] [place],  [address] Phone: [phone] Subjective:    I'm seeing this patient by the request  of:  Colon Branch, MD   CC: back pain   [id number]  Erik [name] is a 61 y.o. male coming in with complaint of  Legs and back. Patient has known degenerative disc disease and lumbar. Patient was found to have some progression on most recent MRI at L4-L5 and L5-S1. Patient was doing decent with muscle relaxer added to his chronic pain meds. Patient states Starting have increasing discomfort again. Patient states that is radiating down the legs. Waking him up at night. States that the severe overall. Has attempted osteopathic manipulation was very minimal benefit previously. Patient states that the pain is unrelenting. Starting to do less and less activity.     Past Medical History:  Diagnosis Date  . 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 ALLERGIC RHINITIS 12/19/2009  . Back pain chronic    On hydrocodone-Neurontin  . Blepharitis    B, chronic  . BPH (benign prostatic hyperplasia)   . Chronic prostatitis     f/u by urology, has a prostate massage prn  . Groin injury   . HYPOGONADISM, MALE 08/11/2007   f/u by Dr Thomasene Mohair urology    . OSTEOPOROSIS, IDIOPATHIC 08/11/2007   f/u at [facility] , now improved to osteopenia  . Raynaud's disease /phenomenon 08/16/2012  . Rosacea   . Shingles 09-2014  . Varicose veins    Past Surgical History:  Procedure Laterality Date  . BLEPHAROPLASTY     B blepharoplasty @ [facility] 08-2011  . CERVICAL FUSION  1994   C4-C5-C6 fusion  . IR GENERIC HISTORICAL  03/17/2016   IR EMBO VENOUS NOT HEMORR HEMANG  INC GUIDE ROADMAPPING 03/17/2016 Greggory Keen, MD [facility]  . TRANSURETHRAL RESECTION OF PROSTATE  07-2009   Social History   Social History  . Marital status: Married    Spouse name: N/A  . Number of children: 3  . Years of education: N/A   Occupational History  . software  trainer      Social History Main Topics  . Smoking status: Never Smoker  . Smokeless tobacco: Never Used  . Alcohol use No  . Drug use: No  . Sexual activity: Not Asked   Other Topics Concern  . None   Social History Narrative   Lives with wife.  They have three children.   Daughter is an Ship broker     Allergies  Allergen Reactions  . Sulfa Drugs Cross Reactors Nausea And Vomiting   Family History  Problem Relation Age of Onset  . COPD Father        Died, 71  . Hypertension Father   . Stroke Father   . CAD Father   . Diabetes Mother   . Hypertension Mother   . CAD Mother           . Colon polyps Mother   . Cancer Mother        Ovarian cancer  . Hypertension Sister   . Fibromyalgia Sister   . Hyperlipidemia Sister   . Colon cancer Maternal Aunt   . Prostate cancer Neg Hx      Past medical history, social, surgical and family history all reviewed in electronic medical record.  No pertanent information unless stated regarding to the chief complaint.   Review of Systems:Review of systems updated and as accurate as of 03/25/17  No headache, visual changes, nausea, vomiting, diarrhea, constipation, dizziness, abdominal pain, skin rash, fevers, chills, night sweats, weight loss, swollen lymph nodes, body aches, joint swelling, chest pain, shortness of breath, mood changes. Positive muscle aches  Objective  Blood pressure 120/82, pulse 71, height 6' (1.829 m), weight 215 lb (97.5 kg), SpO2 98 %. Systems examined below as of 03/25/17   General: No apparent distress alert and oriented x3 mood and affect normal, dressed appropriately.  HEENT: Pupils equal, extraocular movements intact  Respiratory: Patient's speak in full sentences and does not appear short of breath  Cardiovascular: No lower extremity edema, non tender, no erythema  Skin: Warm dry intact with no signs of infection or rash on extremities or on axial skeleton.  Abdomen: Soft nontender  Neuro: Cranial  nerves II through XII are intact, neurovascularly intact in all extremities with 2+ DTRs and 2+ pulses.  Lymph: No lymphadenopathy of posterior or anterior cervical chain or axillae bilaterally.  Gait Mild antalgic gait MSK:  Non tender with full range of motion and good stability and symmetric strength and tone of shoulders, elbows, wrist, hip, knee and ankles bilaterally.  Back Exam:  Inspection: Unremarkable  Motion: Flexion 25 deg with worsening pain and radicular symptoms down the right leg, Extension 15 deg, Side Bending to 35 deg bilaterally,  Rotation to 35 deg bilaterally  SLR laying: Positive right XSLR laying: Negative  Palpable tenderness: Tender to palpation and appears palmar suture on the right side. FABER: Severe tightness on the right. Sensory change: Gross sensation intact to all lumbar and sacral dermatomes.  Reflexes: 2+ at both patellar tendons, 2+ at achilles tendons, Babinski's downgoing.  Strength at foot  4 out of 5 strength on the right leg with dorsiflexion and plantar flexion compared to the contralateral side     Impression and Recommendations:     This case required medical decision making of moderate complexity.      Note: This dictation was prepared with Dragon dictation along with smaller phrase technology. Any transcriptional errors that result from this process are unintentional.

## 2017-03-25 ENCOUNTER — Encounter: Payer: Self-pay | Admitting: Family Medicine

## 2017-03-25 ENCOUNTER — Other Ambulatory Visit (INDEPENDENT_AMBULATORY_CARE_PROVIDER_SITE_OTHER): Payer: 59

## 2017-03-25 ENCOUNTER — Ambulatory Visit (INDEPENDENT_AMBULATORY_CARE_PROVIDER_SITE_OTHER): Payer: 59 | Admitting: Family Medicine

## 2017-03-25 VITALS — BP 120/82 | HR 71 | Ht 72.0 in | Wt 215.0 lb

## 2017-03-25 DIAGNOSIS — M2550 Pain in unspecified joint: Secondary | ICD-10-CM

## 2017-03-25 DIAGNOSIS — M5136 Other intervertebral disc degeneration, lumbar region: Secondary | ICD-10-CM

## 2017-03-25 DIAGNOSIS — M5416 Radiculopathy, lumbar region: Secondary | ICD-10-CM | POA: Diagnosis not present

## 2017-03-25 LAB — COMPREHENSIVE METABOLIC PANEL
ALK PHOS: 64 U/L (ref 39–117)
ALT: 35 U/L (ref 0–53)
AST: 26 U/L (ref 0–37)
Albumin: 4 g/dL (ref 3.5–5.2)
BUN: 18 mg/dL (ref 6–23)
CO2: 28 mEq/L (ref 19–32)
Calcium: 8.7 mg/dL (ref 8.4–10.5)
Chloride: 104 mEq/L (ref 96–112)
Creatinine, Ser: 0.85 mg/dL (ref 0.40–1.50)
GFR: 96.77 mL/min (ref >60.00)
GLUCOSE: 98 mg/dL (ref 70–99)
Potassium: 4.1 mEq/L (ref 3.5–5.1)
Sodium: 140 mEq/L (ref 135–145)
Total Bilirubin: 1.2 mg/dL (ref 0.2–1.2)
Total Protein: 6.2 g/dL (ref 6.0–8.3)

## 2017-03-25 LAB — IBC PANEL
IRON: 90 ug/dL (ref 42–165)
SATURATION RATIOS: 31.8 % (ref 20.0–50.0)
Transferrin: 202 mg/dL — ABNORMAL LOW (ref 212.0–360.0)

## 2017-03-25 LAB — CBC WITH DIFFERENTIAL/PLATELET
Basophils Absolute: 0 10*3/uL (ref 0.0–0.1)
Basophils Relative: 0.9 % (ref 0.0–3.0)
EOS PCT: 2 % (ref 0.0–5.0)
Eosinophils Absolute: 0.1 10*3/uL (ref 0.0–0.7)
HEMATOCRIT: 48 % (ref 39.0–52.0)
Hemoglobin: 16.4 g/dL (ref 13.0–17.0)
LYMPHS ABS: 1.4 10*3/uL (ref 0.7–4.0)
Lymphocytes Relative: 28.6 % (ref 12.0–46.0)
MCHC: 34.2 g/dL (ref 30.0–36.0)
MCV: 91.8 fl (ref 78.0–100.0)
MONOS PCT: 8.5 % (ref 3.0–12.0)
Monocytes Absolute: 0.4 10*3/uL (ref 0.1–1.0)
NEUTROS PCT: 60 % (ref 43.0–77.0)
Neutro Abs: 2.9 10*3/uL (ref 1.4–7.7)
Platelets: 208 10*3/uL (ref 150.0–400.0)
RBC: 5.24 Mil/uL (ref 4.22–5.81)
RDW: 13.1 % (ref 11.5–15.5)
WBC: 4.8 10*3/uL (ref 4.0–10.5)

## 2017-03-25 LAB — TSH: TSH: 2.17 u[IU]/mL (ref 0.35–4.50)

## 2017-03-25 LAB — TESTOSTERONE: Testosterone: 414.52 ng/dL (ref 300.00–890.00)

## 2017-03-25 LAB — SEDIMENTATION RATE: Sed Rate: 1 mm/hr (ref 0–20)

## 2017-03-25 LAB — MAGNESIUM: Magnesium: 2.1 mg/dL (ref 1.5–2.5)

## 2017-03-25 LAB — VITAMIN D 25 HYDROXY (VIT D DEFICIENCY, FRACTURES): VITD: 31.59 ng/mL (ref 30.00–100.00)

## 2017-03-25 LAB — C-REACTIVE PROTEIN: CRP: 0.9 mg/dL (ref 0.5–20.0)

## 2017-03-25 MED ORDER — PREDNISONE 50 MG PO TABS: 50.0000 mg | ORAL_TABLET | Freq: Every day | ORAL | 0 refills | Status: DC

## 2017-03-25 MED ORDER — METHYLPREDNISOLONE ACETATE 80 MG/ML IJ SUSP
80.0000 mg | Freq: Once | INTRAMUSCULAR | Status: AC
  Administered 2017-03-25: 80 mg via INTRAMUSCULAR

## 2017-03-25 MED ORDER — KETOROLAC TROMETHAMINE 60 MG/2ML IM SOLN
60.0000 mg | Freq: Once | INTRAMUSCULAR | Status: AC
  Administered 2017-03-25: 60 mg via INTRAMUSCULAR

## 2017-03-25 MED ORDER — TIZANIDINE HCL 4 MG PO TABS: 4.0000 mg | ORAL_TABLET | Freq: Four times a day (QID) | ORAL | 0 refills | Status: DC | PRN

## 2017-03-25 MED FILL — predniSONE 50 MG TABS: 50 | 5 days supply | Qty: 5 | Fill #0

## 2017-03-25 MED FILL — tiZANidine HCL 4 MG TABS: 4 | 8 days supply | Qty: 30 | Fill #0

## 2017-03-25 NOTE — Addendum Note (Signed)
Addended by: Lyndal Pulley on: 03/25/2017 04:13 PM   Modules accepted: Orders

## 2017-03-25 NOTE — Assessment & Plan Note (Signed)
Patient is having signs and symptoms of potential spinal stenosis especially at the L5-S1 area. Positive straight leg test. Pain worsening with extension as well as with flexion. Patient's no longer responding to over-the-counter medications and I do not feel that patient would respond well to osteopathic manipulation today. Given injections to help with some the pain and we'll do a 5 day course of prednisone. Could be a candidate for epidural steroid injections if needed. Patient is in agreement with plan. Follow-up again in 2 weeks

## 2017-03-25 NOTE — Patient Instructions (Signed)
Good to see you  2 injections today  Labs downstairs Prednisone daily 5 days Zanaflex up to 3 times a day for muscle spasm but especially take it at night Send a message in a week if not better we will do epidural  IF we do epidural then we will see you 2 weeks after the injection

## 2017-03-28 ENCOUNTER — Encounter (INDEPENDENT_AMBULATORY_CARE_PROVIDER_SITE_OTHER): Payer: Self-pay | Admitting: Orthopaedic Surgery

## 2017-03-29 LAB — ANGIOTENSIN CONVERTING ENZYME: Angiotensin-Converting Enzyme: 38 U/L (ref 9–67)

## 2017-03-29 LAB — PTH, INTACT AND CALCIUM
Calcium: 8.5 mg/dL — ABNORMAL LOW (ref 8.6–10.3)
PTH: 36 pg/mL (ref 14–64)

## 2017-03-29 LAB — CYCLIC CITRUL PEPTIDE ANTIBODY, IGG: Cyclic Citrullin Peptide Ab: <16 UNITS

## 2017-03-29 LAB — CALCIUM, IONIZED: CALCIUM ION: 4.9 mg/dL (ref 4.8–5.6)

## 2017-03-29 LAB — ANA: Anti Nuclear Antibody(ANA): NEGATIVE

## 2017-03-29 LAB — RHEUMATOID FACTOR: Rhuematoid fact SerPl-aCnc: <14 IU/mL (ref <14)

## 2017-03-30 MED FILL — OMEPRAZOLE DR 40 MG CAPSULE: 40 | 90 days supply | Qty: 90 | Fill #2

## 2017-04-02 ENCOUNTER — Encounter: Payer: Self-pay | Admitting: Family Medicine

## 2017-04-02 DIAGNOSIS — M5416 Radiculopathy, lumbar region: Secondary | ICD-10-CM

## 2017-04-06 DIAGNOSIS — N419 Inflammatory disease of prostate, unspecified: Secondary | ICD-10-CM | POA: Diagnosis not present

## 2017-04-12 DIAGNOSIS — M84375A Stress fracture, left foot, initial encounter for fracture: Secondary | ICD-10-CM | POA: Diagnosis not present

## 2017-04-12 DIAGNOSIS — M868X7 Other osteomyelitis, ankle and foot: Secondary | ICD-10-CM | POA: Diagnosis not present

## 2017-04-15 DIAGNOSIS — N400 Enlarged prostate without lower urinary tract symptoms: Secondary | ICD-10-CM | POA: Diagnosis not present

## 2017-04-15 DIAGNOSIS — N411 Chronic prostatitis: Secondary | ICD-10-CM | POA: Diagnosis not present

## 2017-04-15 DIAGNOSIS — E291 Testicular hypofunction: Secondary | ICD-10-CM | POA: Diagnosis not present

## 2017-04-15 MED FILL — GABAPENTIN 300 MG CAPSULE: 300 | 30 days supply | Qty: 30 | Fill #3

## 2017-04-22 ENCOUNTER — Ambulatory Visit: Payer: 59

## 2017-04-23 ENCOUNTER — Ambulatory Visit (INDEPENDENT_AMBULATORY_CARE_PROVIDER_SITE_OTHER): Payer: 59

## 2017-04-23 DIAGNOSIS — Z23 Encounter for immunization: Secondary | ICD-10-CM | POA: Diagnosis not present

## 2017-04-30 ENCOUNTER — Other Ambulatory Visit: Payer: Self-pay | Admitting: Family Medicine

## 2017-04-30 MED FILL — ALLOPURINOL 100 MG TABS: 100 | 30 days supply | Qty: 30 | Fill #2

## 2017-05-03 MED FILL — traMADol HCL 50 MG TABS: 50 | 10 days supply | Qty: 30 | Fill #0

## 2017-05-06 ENCOUNTER — Ambulatory Visit (INDEPENDENT_AMBULATORY_CARE_PROVIDER_SITE_OTHER): Payer: 59 | Admitting: Family Medicine

## 2017-05-06 ENCOUNTER — Encounter: Payer: Self-pay | Admitting: Family Medicine

## 2017-05-06 DIAGNOSIS — M5416 Radiculopathy, lumbar region: Secondary | ICD-10-CM | POA: Diagnosis not present

## 2017-05-06 NOTE — Patient Instructions (Signed)
Good to see you  Erik [name] is your friend.  Watch the sleep  Continue the tylenol  Call [phone] and schedule the epidural  Once you have the epidural see me again 2-3 weeks after

## 2017-05-06 NOTE — Progress Notes (Signed)
[name] [address], [place] [address] Phone: [phone] Subjective:     CC: Muscle pains, neck and back pain  DGL:[id number]  Erik [name] is a 61 y.o. male coming in with complaint of neck and back pain mostly but significant myalgias. Patient did have laboratory workup that was unremarkable. Patient was having exacerbation of his underlying arthritis. Patient does have a epidural ordered at this moment but does not appear to be scheduled. Patient states Neck pain seems very worsening. Radiation down the legs. States that 200 feet and walking causes severe amount of pain overall. Patient states that this is affecting daily activities significantly.     past surgical history includes an ACDF at C4-5 and C5-C6. Patient has had progression at C6-C7 and C3-C4. Past medical history is also significant for right laminectomy at L3-L4 with mild narrowing at L4-L5 and L5-S1 on the left seen on MRI 07/07/2016 these were independently visualized by me  Past Medical History:  Diagnosis Date  . 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 ALLERGIC RHINITIS 12/19/2009  . Back pain chronic    On hydrocodone-Neurontin  . Blepharitis    B, chronic  . BPH (benign prostatic hyperplasia)   . Chronic prostatitis     f/u by urology, has a prostate massage prn  . Groin injury   . HYPOGONADISM, MALE 08/11/2007   f/u by Dr Thomasene Mohair urology    . OSTEOPOROSIS, IDIOPATHIC 08/11/2007   f/u at [facility] , now improved to osteopenia  . Raynaud's disease /phenomenon 08/16/2012  . Rosacea   . Shingles 09-2014  . Varicose veins    Past Surgical History:  Procedure Laterality Date  . BLEPHAROPLASTY     B blepharoplasty @ [facility] 08-2011  . CERVICAL FUSION  1994   C4-C5-C6 fusion  . IR GENERIC HISTORICAL  03/17/2016   IR EMBO VENOUS NOT HEMORR HEMANG  INC GUIDE ROADMAPPING 03/17/2016 Greggory Keen, MD [facility]  . TRANSURETHRAL RESECTION OF PROSTATE  07-2009    Social History   Social History  . Marital status: Married    Spouse name: N/A  . Number of children: 3  . Years of education: N/A   Occupational History  . software trainer      Social History Main Topics  . Smoking status: Never Smoker  . Smokeless tobacco: Never Used  . Alcohol use No  . Drug use: No  . Sexual activity: Not on file   Other Topics Concern  . Not on file   Social History Narrative   Lives with wife.  They have three children.   Daughter is an Ship broker     Allergies  Allergen Reactions  . Sulfa Drugs Cross Reactors Nausea And Vomiting   Family History  Problem Relation Age of Onset  . COPD Father        Died, 71  . Hypertension Father   . Stroke Father   . CAD Father   . Diabetes Mother   . Hypertension Mother   . CAD Mother           . Colon polyps Mother   . Cancer Mother        Ovarian cancer  . Hypertension Sister   . Fibromyalgia Sister   . Hyperlipidemia Sister   . Colon cancer Maternal Aunt   . Prostate cancer Neg Hx      Past medical history, social, surgical and family history all reviewed in electronic medical record.  No pertanent information unless stated regarding to the chief complaint.   Review of Systems:Review of systems updated and as accurate as of 05/06/17  No headache, visual changes, nausea, vomiting, diarrhea, constipation, dizziness, abdominal pain, skin rash, fevers, chills, night sweats, weight loss, swollen lymph nodes, body aches, joint swelling,chest pain, shortness of breath, mood changes. Positive muscle aches  Objective  There were no vitals taken for this visit. Systems examined below as of 05/06/17   General: No apparent distress alert and oriented x3 mood and affect normal, dressed appropriately.  HEENT: Pupils equal, extraocular movements intact  Respiratory: Patient's speak in full sentences and does not appear short of breath  Cardiovascular: No lower extremity edema, non tender, no erythema   Skin: Warm dry intact with no signs of infection or rash on extremities or on axial skeleton.  Abdomen: Soft nontender  Neuro: Cranial nerves II through XII are intact, neurovascularly intact in all extremities with 2+ DTRs and 2+ pulses.  Lymph: No lymphadenopathy of posterior or anterior cervical chain or axillae bilaterally.  Gait normal with good balance and coordination.  MSK:  Non tender with full range of motion and good stability and symmetric strength and tone of shoulders, elbows, wrist, hip, knee and ankles bilaterally.  Back Exam:  Inspection: Loss of lordosis with some degenerative scoliosis Motion: Flexion 45 deg, Extension 15 deg, Side Bending to 35 deg bilaterally,  Rotation to 45 deg bilaterally  SLR laying: Mild positive right which is new XSLR laying: Negative  Palpable tenderness: Tender to palpation of the paraspinal muscular trauma lumbar spine. FABER: Positive tightness bilaterally. Sensory change: Gross sensation intact to all lumbar and sacral dermatomes.  Reflexes: 2+ at both patellar tendons, 2+ at achilles tendons, Babinski's downgoing.  Strength at foot  4-5 but symmetric.    Impression and Recommendations:     This case required medical decision making of moderate complexity.      Note: This dictation was prepared with Dragon dictation along with smaller phrase technology. Any transcriptional errors that result from this process are unintentional.

## 2017-05-06 NOTE — Assessment & Plan Note (Signed)
Patient is having signs and symptoms that is consistent more with a spinal stenosis with radicular symptoms. We discussed with patient at great length about different treatment options and patient with not responding to the supine manipulation can benefit from a possible epidural. This was ordered today. We discussed worsening symptoms and possible surgical intervention may be necessary with patient having a history of laminectomy adjacent segment disease is within the differential as well. Patient continue all other medications at this point. Follow-up with me again in 2 weeks after the epidural to see how patient is responding.

## 2017-05-13 ENCOUNTER — Ambulatory Visit
Admission: RE | Admit: 2017-05-13 | Discharge: 2017-05-13 | Disposition: A | Discharge status: Home / Self Care | Payer: 59 | Source: Ambulatory Visit | Attending: Family Medicine | Admitting: Family Medicine

## 2017-05-13 DIAGNOSIS — M4727 Other spondylosis with radiculopathy, lumbosacral region: Secondary | ICD-10-CM | POA: Diagnosis not present

## 2017-05-13 DIAGNOSIS — M5416 Radiculopathy, lumbar region: Secondary | ICD-10-CM

## 2017-05-13 MED ORDER — METHYLPREDNISOLONE ACETATE 40 MG/ML INJ SUSP (RADIOLOG
120.0000 mg | Freq: Once | INTRAMUSCULAR | Status: AC
  Administered 2017-05-13: 120 mg via EPIDURAL

## 2017-05-13 MED ORDER — IOPAMIDOL (ISOVUE-M 200) INJECTION 41%
1.0000 mL | Freq: Once | INTRAMUSCULAR | Status: AC
  Administered 2017-05-13: 1 mL via EPIDURAL

## 2017-05-13 NOTE — Discharge Instructions (Signed)

## 2017-05-24 DIAGNOSIS — H40023 Open angle with borderline findings, high risk, bilateral: Secondary | ICD-10-CM | POA: Diagnosis not present

## 2017-05-24 DIAGNOSIS — H353131 Nonexudative age-related macular degeneration, bilateral, early dry stage: Secondary | ICD-10-CM | POA: Diagnosis not present

## 2017-05-24 DIAGNOSIS — H25013 Cortical age-related cataract, bilateral: Secondary | ICD-10-CM | POA: Diagnosis not present

## 2017-05-24 DIAGNOSIS — H2513 Age-related nuclear cataract, bilateral: Secondary | ICD-10-CM | POA: Diagnosis not present

## 2017-06-01 DIAGNOSIS — N411 Chronic prostatitis: Secondary | ICD-10-CM | POA: Diagnosis not present

## 2017-06-03 ENCOUNTER — Other Ambulatory Visit: Payer: Self-pay | Admitting: Family Medicine

## 2017-06-03 MED FILL — GABAPENTIN 300 MG CAPSULE: 300 | 30 days supply | Qty: 30 | Fill #0

## 2017-06-09 ENCOUNTER — Ambulatory Visit (INDEPENDENT_AMBULATORY_CARE_PROVIDER_SITE_OTHER): Payer: 59 | Admitting: Internal Medicine

## 2017-06-09 ENCOUNTER — Encounter: Payer: Self-pay | Admitting: Internal Medicine

## 2017-06-09 VITALS — BP 128/78 | HR 75 | Temp 98.2°F | Resp 14 | Ht 72.0 in | Wt 193.0 lb

## 2017-06-09 DIAGNOSIS — Z0000 Encounter for general adult medical examination without abnormal findings: Secondary | ICD-10-CM

## 2017-06-09 DIAGNOSIS — E559 Vitamin D deficiency, unspecified: Secondary | ICD-10-CM | POA: Diagnosis not present

## 2017-06-09 LAB — LIPID PANEL
CHOLESTEROL: 186 mg/dL (ref 0–200)
HDL: 45.4 mg/dL (ref >39.00)
LDL Cholesterol: 130 mg/dL — ABNORMAL HIGH (ref 0–99)
NonHDL: 140.14
TRIGLYCERIDES: 52 mg/dL (ref 0.0–149.0)
Total CHOL/HDL Ratio: 4
VLDL: 10.4 mg/dL (ref 0.0–40.0)

## 2017-06-09 MED ORDER — AZELASTINE HCL 0.1 % NA SOLN: 2.0000 | Freq: Every evening | NASAL | 3 refills | Status: DC | PRN

## 2017-06-09 MED FILL — AZELASTINE HCL 137 MCG SPRY: 0.1 | 30 days supply | Qty: 30 | Fill #0

## 2017-06-09 NOTE — Assessment & Plan Note (Addendum)
-  Td 2013; Zostavax : 2014; shingrex discussed;  had a flu shot -CCS:  Colonoscopy Dr Waverly Ferrari, first [place] 2004 (polyp) , then 01/02/2008 (tubular adenoma), then 10-2015:1 polyp, 5 years  -PSA per urology -Diet, exercise:  Discussed.  Used to be a runner, had stress fracture left foot recently, not running much, recommend alternative exercise such as bicycling. -Labs: Reviewed, will check a FLP and vitamin D

## 2017-06-09 NOTE — Patient Instructions (Addendum)
GO TO THE LAB : Get the blood work     GO TO THE FRONT DESK Schedule your next appointment for a  Physical exam in 1 year   IBUPROFEN (Advil or Motrin) 200 mg 2 tablets at night  as needed for pain.  Always take it with food because may cause gastritis and ulcers.  If you notice nausea, stomach pain, change in the color of stools --->  Stop the medicine and let us know  Astelin at night    HEALTHY SLEEP Sleep hygiene: Basic rules for a good night's sleep  Sleep only as much as you need to feel rested and then get out of bed  Keep a regular sleep schedule  Avoid forcing sleep  Exercise regularly for at least 20 minutes, preferably 4 to 5 hours before bedtime  Avoid caffeinated beverages after lunch  Avoid alcohol near bedtime: no "night cap"  Avoid smoking, especially in the evening  Do not go to bed hungry  Adjust bedroom environment  Avoid prolonged use of light-emitting screens before bedtime   Deal with your worries before bedtime

## 2017-06-09 NOTE — Progress Notes (Signed)
Subjective:    Patient ID: Erik [name], male    DOB: 1955-10-05, 61 y.o.   MRN: [id number]  DOS:  06/09/2017 Type of visit - description : cpx Interval history: Has few concerns   Review of Systems  Nocturnal L shoulder pain for 2 months, no injury, no neck pain per se.  Has a leftover tramadol which does not help much.  During the daytime, he only has left shoulder pain with certain movements. Insomnia for a couple of months, difficulty started actually before the shoulder pain.  Taking melatonin for a few days, has not helped so far Postnasal drip persistently for the last 2 months.  Denies any sneezing, sore throat, cough, eyes itching.   Other than above, a 14 point review of systems is negative     Past Medical History:  Diagnosis Date  . 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 ALLERGIC RHINITIS 12/19/2009  . Back pain chronic    [name] hydrocodone-Neurontin  . Blepharitis    B, chronic  . BPH (benign prostatic hyperplasia)   . Chronic prostatitis     f/u by urology, has a prostate massage prn  . Groin injury   . HYPOGONADISM, MALE 08/11/2007   f/u by Dr Thomasene Mohair urology    . OSTEOPOROSIS, IDIOPATHIC 08/11/2007   f/u at [facility] , now improved to osteopenia  . Raynaud's disease /phenomenon 08/16/2012  . Rosacea   . Shingles 09-2014  . Varicose veins     Past Surgical History:  Procedure Laterality Date  . BLEPHAROPLASTY     B blepharoplasty @ [facility] 08-2011  . CERVICAL FUSION  1994   C4-C5-C6 fusion  . IR GENERIC HISTORICAL  03/17/2016   IR EMBO VENOUS NOT HEMORR HEMANG  INC GUIDE ROADMAPPING 03/17/2016 Greggory Keen, MD [facility]  . TRANSURETHRAL RESECTION OF PROSTATE  07-2009    Social History   Socioeconomic History  . Marital status: Married    Spouse name: Not [name] file  . Number of children: 3  . Years of education: Not [name] file  . Highest education level: Not [name] file  Social Needs  . Financial resource strain: Not [name] file  . Food insecurity -  worry: Not [name] file  . Food insecurity - inability: Not [name] file  . Transportation needs - medical: Not [name] file  . Transportation needs - non-medical: Not [name] file  Occupational History  . Occupation: [employer]   Tobacco Use  . Smoking status: Never Smoker  . Smokeless tobacco: Never Used  Substance and Sexual Activity  . Alcohol use: No    Alcohol/week: 0.0 oz  . Drug use: No  . Sexual activity: Not [name] file  Other Topics Concern  . Not [name] file  Social History Narrative   Lives with wife.  They have three children.   Daughter is an Ship broker       Family History  Problem Relation Age of Onset  . COPD Father        Died, 71  . Hypertension Father   . Stroke Father   . CAD Father   . Diabetes Mother   . Hypertension Mother   . CAD Mother           . Colon polyps Mother   . Cancer Mother        Ovarian cancer  . Hypertension Sister   . Fibromyalgia Sister   . Hyperlipidemia Sister   . Colon cancer Maternal Aunt   . Prostate  cancer Neg Hx      Allergies as of 06/09/2017      Reactions   Sulfa Drugs Cross Reactors Nausea And Vomiting      Medication List        Accurate as of 06/09/17  4:59 PM. Always use your most recent med list.          azelastine 0.1 % nasal spray Commonly known as:  ASTELIN Place 2 sprays into both nostrils at bedtime as needed for rhinitis. Use in each nostril as directed   gabapentin 300 MG capsule Commonly known as:  NEURONTIN TAKE 1 CAPSULE (300 MG TOTAL) BY MOUTH AT BEDTIME.   loratadine 10 MG tablet Commonly known as:  CLARITIN Take 10 mg by mouth daily.   MULTI-VITAMINS Tabs Take by mouth.   omeprazole 40 MG capsule Commonly known as:  PRILOSEC Take 1 capsule (40 mg total) by mouth daily.   testosterone cypionate 100 MG/ML injection Commonly known as:  DEPOTESTOTERONE CYPIONATE Inject into the muscle every 14 (fourteen) days. For IM use only   traMADol 50 MG tablet Commonly known as:  ULTRAM TAKE 1 TABLET BY MOUTH  EVERY 8 HOURS AS NEEDED          Objective:   Physical Exam BP 128/78 (BP Location: Left Arm, Patient Position: Sitting, Cuff Size: Small)   Pulse 75   Temp 98.2 F (36.8 C) (Oral)   Resp 14   Ht 6' (1.829 m)   Wt 193 lb (87.5 kg)   SpO2 97%   BMI 26.18 kg/m  General:   Well developed, well nourished . NAD.  Neck: No  thyromegaly  HEENT:  Normocephalic . Face symmetric, atraumatic Lungs:  CTA B Normal respiratory effort, no intercostal retractions, no accessory muscle use. Heart: RRR,  no murmur.  No pretibial edema bilaterally  Abdomen:  Not distended, soft, non-tender. No rebound or rigidity.   MSK Shoulders symmetric, range of motion normal bilaterally with some pain with left arm elevation Skin: Exposed areas without rash. Not pale. Not jaundice Neurologic:  alert & oriented X3.  Speech normal, gait appropriate for age and unassisted Strength symmetric and appropriate for age.  Psych: Cognition and judgment appear intact.  Cooperative with normal attention span and concentration.  Behavior appropriate. No anxious or depressed appearing.     Assessment & Plan:   Assessment   Osteoporosis, idiopathic, follow-up at [facility]. Raynaud phenomena 2013--Dr Truslow, amlodipine prn MS[facility] -- s/p Cervical fusion --Chronic back pain: used to be [name] Hydrocodone rx by pcp;  [name] gabapentin ) -- L shoulder adhesive capsulitis -- stress fracture L sesamoid (foot) dx 2018) GU: Dr. Thomasene Mohair, urology --BPH, TURP 2011 --Chronic prostatitis --Hypogonadism, per urology Rosacea Varicose veins treatment @ [facility] [facility] OPHT: Glaucoma -Chronic blepharitis- ocular rosacea- Mac degeneration H/o Shingles 09-2014   PLAN Allergic rhinitis: Having a lot of postnasal dripping, recommend Astelin.  Call if not better Nocturnal left shoulder pain: H/o left shoulder capsulitis, recommend Motrin at night, plans to see Dr. Tamala Julian (sports medicine) in few weeks.  Recommend to discuss with him. Vitamin  D: Recently had ergocalciferol, now [name] a multivitamin daily.  Checking levels Insomnia: Started before shoulder pain, is trying melatonin, healthy sleep habits discussed, call if not improving. Chronic medical problems managed elsewhere. RTC one year

## 2017-06-09 NOTE — Progress Notes (Signed)
Pre visit review using our clinic review tool, if applicable. No additional management support is needed unless otherwise documented below in the visit note. 

## 2017-06-09 NOTE — Assessment & Plan Note (Signed)
Allergic rhinitis: Having a lot of postnasal dripping, recommend Astelin.  Call if not better Nocturnal left shoulder pain: H/o left shoulder capsulitis, recommend Motrin at night, plans to see Dr. Tamala Julian (sports medicine) in few weeks.  Recommend to discuss with him. Vitamin D: Recently had ergocalciferol, now on a multivitamin daily.  Checking levels Insomnia: Started before shoulder pain, is trying melatonin, healthy sleep habits discussed, call if not improving. Chronic medical problems managed elsewhere. RTC one year

## 2017-06-11 LAB — VITAMIN D 1,25 DIHYDROXY
Vitamin D 1, 25 (OH)2 Total: 43 pg/mL (ref 18–72)
Vitamin D2 1, 25 (OH)2: <8 pg/mL
Vitamin D3 1, 25 (OH)2: 43 pg/mL

## 2017-06-15 ENCOUNTER — Ambulatory Visit: Payer: 59 | Admitting: Family Medicine

## 2017-06-16 ENCOUNTER — Encounter (INDEPENDENT_AMBULATORY_CARE_PROVIDER_SITE_OTHER): Payer: Self-pay | Admitting: Orthopaedic Surgery

## 2017-06-16 ENCOUNTER — Encounter: Payer: Self-pay | Admitting: Family Medicine

## 2017-06-16 DIAGNOSIS — M5416 Radiculopathy, lumbar region: Secondary | ICD-10-CM

## 2017-06-17 ENCOUNTER — Telehealth: Payer: Self-pay | Admitting: Internal Medicine

## 2017-06-17 MED ORDER — ZOSTER VAC RECOMB ADJUVANTED 50 MCG/0.5ML IM SUSR: 0.5000 mL | Freq: Once | INTRAMUSCULAR | 1 refills | Status: AC

## 2017-06-17 MED FILL — TESTOSTERONE CYPIONATE 200: 200 | 84 days supply | Qty: 6 | Fill #1

## 2017-06-17 NOTE — Telephone Encounter (Signed)
Copied from [place]. Topic: Quick Communication - See Telephone Encounter >> Jun 17, 2017 10:30 AM Hewitt Shorts wrote: CRM for notification. See Telephone encounter for:  Pt is calling stating that the pharmacy at [facility] [place] has the new shingles shot all they need is an order and he needs this faxed to the pharmacy   Best number [phone] 06/17/17.

## 2017-06-17 NOTE — Telephone Encounter (Signed)
Pt informed that order has been sent downstairs to [facility]- recommended he call and see if appt is needed or if they take walk-ins. Pt verbalized understanding.

## 2017-06-24 MED FILL — SHINGRIX 50 MCG SUS: 50 | 1 days supply | Qty: 1 | Fill #0

## 2017-06-25 ENCOUNTER — Inpatient Hospital Stay
Admission: RE | Admit: 2017-06-25 | Discharge: 2017-06-25 | Disposition: A | Discharge status: Home / Self Care | Payer: 59 | Source: Ambulatory Visit | Attending: Family Medicine | Admitting: Family Medicine

## 2017-06-25 ENCOUNTER — Ambulatory Visit (INDEPENDENT_AMBULATORY_CARE_PROVIDER_SITE_OTHER): Payer: 59

## 2017-06-25 DIAGNOSIS — Z23 Encounter for immunization: Secondary | ICD-10-CM

## 2017-07-02 ENCOUNTER — Ambulatory Visit (INDEPENDENT_AMBULATORY_CARE_PROVIDER_SITE_OTHER): Payer: 59 | Admitting: Family Medicine

## 2017-07-02 ENCOUNTER — Encounter: Payer: Self-pay | Admitting: Family Medicine

## 2017-07-02 DIAGNOSIS — M51369 Other intervertebral disc degeneration, lumbar region without mention of lumbar back pain or lower extremity pain: Secondary | ICD-10-CM

## 2017-07-02 DIAGNOSIS — M5136 Other intervertebral disc degeneration, lumbar region: Secondary | ICD-10-CM

## 2017-07-02 MED ORDER — VITAMIN D (ERGOCALCIFEROL) 1.25 MG (50000 UNIT) PO CAPS: 50000.0000 [IU] | ORAL_CAPSULE | ORAL | 0 refills | Status: DC

## 2017-07-02 MED ORDER — GABAPENTIN 300 MG PO CAPS: 300.0000 mg | ORAL_CAPSULE | Freq: Every day | ORAL | 3 refills | Status: DC

## 2017-07-02 MED FILL — VIT D2 1.25 MG (50,000 UNIT: 1.25 MG | 84 days supply | Qty: 12 | Fill #0

## 2017-07-02 MED FILL — GABAPENTIN 300 MG CAPSULE: 300 | 90 days supply | Qty: 90 | Fill #0

## 2017-07-02 NOTE — Patient Instructions (Signed)
I am glad you are happy  I would move the injection back if you continue to feel so good Refilled the gabapentin  Lets do once weekly vitamin D for 12 weeks to help muscle strength and help the stress fractures See me again when you need me  Happy New Year!

## 2017-07-02 NOTE — Progress Notes (Signed)
[name] [address], [place] [address] Phone: [phone] Subjective:    I'm seeing this patient by the request  of:    CC: Low back pain follow-up  WCH:ENIDPOEUMP  Erik [name] is a 61 y.o. male coming in with complaint of low back pain.  Found to have severe arthritis of the neck as well as degenerative disc disease of the lumbar spine.  Did fantastic with the first epidural in the back.  Patient states that he does not feel like he will need the second 1 that has been prescribed at this time.  Patient is doing well with the over-the-counter medications and the prescriptions that we have done with no significant side effects.  Been increasing his activity and is feeling good overall.  Patient denies any numbness, any radiation of the leg, and the pain is not stopping him from any daily activities either.     Past Medical History:  Diagnosis Date  . 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 ALLERGIC RHINITIS 12/19/2009  . Back pain chronic    On hydrocodone-Neurontin  . Blepharitis    B, chronic  . BPH (benign prostatic hyperplasia)   . Chronic prostatitis     f/u by urology, has a prostate massage prn  . Groin injury   . HYPOGONADISM, MALE 08/11/2007   f/u by Dr Thomasene Mohair urology    . OSTEOPOROSIS, IDIOPATHIC 08/11/2007   f/u at [facility] , now improved to osteopenia  . Raynaud's disease /phenomenon 08/16/2012  . Rosacea   . Shingles 09-2014  . Varicose veins    Past Surgical History:  Procedure Laterality Date  . BLEPHAROPLASTY     B blepharoplasty @ [facility] 08-2011  . CERVICAL FUSION  1994   C4-C5-C6 fusion  . IR GENERIC HISTORICAL  03/17/2016   IR EMBO VENOUS NOT HEMORR HEMANG  INC GUIDE ROADMAPPING 03/17/2016 Greggory Keen, MD [facility]  . TRANSURETHRAL RESECTION OF PROSTATE  07-2009   Social History   Socioeconomic History  . Marital status: Married    Spouse name: None  . Number of children: 3  . Years of  education: None  . Highest education level: None  Social Needs  . Financial resource strain: None  . Food insecurity - worry: None  . Food insecurity - inability: None  . Transportation needs - medical: None  . Transportation needs - non-medical: None  Occupational History  . Occupation: [employer]   Tobacco Use  . Smoking status: Never Smoker  . Smokeless tobacco: Never Used  Substance and Sexual Activity  . Alcohol use: No    Alcohol/week: 0.0 oz  . Drug use: No  . Sexual activity: None  Other Topics Concern  . None  Social History Narrative   Lives with wife.  They have three children.   Daughter is an Ship broker     Allergies  Allergen Reactions  . Sulfa Drugs Cross Reactors Nausea And Vomiting   Family History  Problem Relation Age of Onset  . COPD Father        Died, 71  . Hypertension Father   . Stroke Father   . CAD Father   . Diabetes Mother   . Hypertension Mother   . CAD Mother           . Colon polyps Mother   . Cancer Mother        Ovarian cancer  . Hypertension Sister   . Fibromyalgia Sister   .  Hyperlipidemia Sister   . Colon cancer Maternal Aunt   . Prostate cancer Neg Hx      Past medical history, social, surgical and family history all reviewed in electronic medical record.  No pertanent information unless stated regarding to the chief complaint.   Review of Systems:Review of systems updated and as accurate as of 07/02/17  No headache, visual changes, nausea, vomiting, diarrhea, constipation, dizziness, abdominal pain, skin rash, fevers, chills, night sweats, weight loss, swollen lymph nodes, body aches, joint swelling, muscle aches, chest pain, shortness of breath, mood changes.   Objective  Blood pressure 110/64, pulse 79, height 6' (1.829 m), weight 198 lb (89.8 kg), SpO2 97 %. Systems examined below as of 07/02/17   General: No apparent distress alert and oriented x3 mood and affect normal, dressed appropriately.  HEENT: Pupils equal,  extraocular movements intact  Respiratory: Patient's speak in full sentences and does not appear short of breath  Cardiovascular: No lower extremity edema, non tender, no erythema  Skin: Warm dry intact with no signs of infection or rash on extremities or on axial skeleton.  Abdomen: Soft nontender  Neuro: Cranial nerves II through XII are intact, neurovascularly intact in all extremities with 2+ DTRs and 2+ pulses.  Lymph: No lymphadenopathy of posterior or anterior cervical chain or axillae bilaterally.  Gait normal with good balance and coordination.  MSK:  Non tender with full range of motion and good stability and symmetric strength and tone of shoulders, elbows, wrist, hip, knee and ankles bilaterally.  Back exam shows the patient does have some mild improvement in range of motion.  Patient still lacking the last 10 degrees of extension.  Lacks last 2-5 degrees of sidebending bilaterally.  Tightness of the [facility] test bilaterally.  Negative straight leg test.  Good strength of the lower extremities.    Impression and Recommendations:     This case required medical decision making of moderate complexity.      Note: This dictation was prepared with Dragon dictation along with smaller phrase technology. Any transcriptional errors that result from this process are unintentional.

## 2017-07-02 NOTE — Assessment & Plan Note (Signed)
Stable at this time with no radicular symptoms.  We discussed icing regimen and home exercises.  As long as patient is well he can follow-up on an as needed basis.  We discussed we can always repeat injections if necessary.  Same thing with patient cervical spine.  Patient is in agreement with plan follow-up as needed

## 2017-07-05 ENCOUNTER — Encounter (INDEPENDENT_AMBULATORY_CARE_PROVIDER_SITE_OTHER): Payer: Self-pay | Admitting: Orthopedic Surgery

## 2017-07-05 ENCOUNTER — Ambulatory Visit (INDEPENDENT_AMBULATORY_CARE_PROVIDER_SITE_OTHER): Payer: 59 | Admitting: Orthopedic Surgery

## 2017-07-05 DIAGNOSIS — M2022 Hallux rigidus, left foot: Secondary | ICD-10-CM | POA: Diagnosis not present

## 2017-07-05 DIAGNOSIS — M2580 Other specified joint disorders, unspecified joint: Secondary | ICD-10-CM

## 2017-07-05 NOTE — Progress Notes (Signed)
Office Visit Note   Patient: Erik [name]           Date of Birth: [birth year]           MRN: [id number] Visit Date: 07/05/2017              Requested by: Colon Branch, [address] [place], [place] [address] PCP: Colon Branch, MD  Chief Complaint  Patient presents with  . Right Foot - Fracture  . Left Foot - Fracture      HPI: Patient is a 61 year old gentleman who has had several year history of sesamoid pain bilaterally.  He complains of pain on the right foot on the medial sesamoid on the left foot on the lateral sesamoid.  Patient is currently wearing Hoka shoes has custom orthotics he states that his pain is improving with the shoe wear.  Assessment & Plan: Visit Diagnoses:  1. Hallux rigidus, left foot   2. Sesamoiditis     Plan: Recommended a Trail running Hoka shoe this will decrease pressure over the sesamoids and will decrease dorsiflexion stress for the left foot.  Discussed that when his custom orthotics wear out he could use a pair of sole orthotics.  Follow-up as needed.  Follow-Up Instructions: Return if symptoms worsen or fail to improve.   Ortho Exam  Patient is alert, oriented, no adenopathy, well-dressed, normal affect, normal respiratory effort. Examination patient has good pulses bilaterally good range of motion the ankle and subtalar joint he has some mild hallux rigidus on the left with dorsiflexion only of about 45 degrees and pain to palpation dorsally over some bone spurs of the MTP joint.  Left foot he also has some tenderness to palpation over the fibular sesamoid and reviewing the MRI scan the left great toe has increased signal over the tibial sesamoid.  There is no signs of any stress fractures.  Examination the right foot he has excellent dorsiflexion of the great toe MTP joint he has good pulses.  Patient is point tender to palpation over the tibial sesamoid on the right MRI scan shows increased signal over the fibular sesamoid  on the right.  Imaging: No results found. No images are attached to the encounter.  Labs: Lab Results  Component Value Date   ESRSEDRATE 1 03/25/2017   ESRSEDRATE 8 02/13/2014   ESRSEDRATE 4 08/16/2012   CRP 0.9 03/25/2017   LABURIC 4.0 02/13/2014    @LABSALLVALUES (HGBA1)@  There is no height or weight on file to calculate BMI.  Orders:  No orders of the defined types were placed in this encounter.  No orders of the defined types were placed in this encounter.    Procedures: No procedures performed  Clinical Data: No additional findings.  ROS:  All other systems negative, except as noted in the HPI. Review of Systems  Objective: Vital Signs: There were no vitals taken for this visit.  Specialty Comments:  No specialty comments available.  PMFS History: Patient Active Problem List   Diagnosis Date Noted  . Lumbar radiculopathy 03/25/2017  . Degenerative cervical disc 06/08/2016  . Degenerative lumbar disc 06/08/2016  . PCP NOTES >>> 04/29/2015  . Binocular vision disorder with diplopia 10/01/2014  . 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 Herpes zoster-- 09-2014 09/05/2014  . Anal fissure 02/28/2014  . Chronic prostatitis 02/28/2014  . Abnormal prostate specific antigen 02/28/2014  . Acid reflux 02/28/2014  .  Ganglion of tendon 02/28/2014  . Incomplete bladder emptying 02/28/2014  . FOM (frequency of micturition) 02/28/2014  . Nonexudative age-related macular degeneration 02/12/2014  . 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 Speech abnormality 02/13/2013  . Raynaud phenomenon 08/16/2012  . Annual physical exam 10/21/2011  . Neck -- back pain- UDS 09/18/2011  . ALLERGIC RHINITIS 12/19/2009  . PELVIC PAIN, CHRONIC 04/03/2009  . Hypogonadism -- per urology 08/11/2007  . OSTEOPOROSIS, IDIOPATHIC 08/11/2007  . HEADACHE 08/11/2007   Past Medical History:  Diagnosis Date  . Adhesive capsulitis of  left shoulder 05-2015  . ALLERGIC RHINITIS 12/19/2009  . Back pain chronic    On hydrocodone-Neurontin  . Blepharitis    B, chronic  . BPH (benign prostatic hyperplasia)   . Chronic prostatitis     f/u by urology, has a prostate massage prn  . Groin injury   . HYPOGONADISM, MALE 08/11/2007   f/u by Dr Thomasene Mohair urology    . OSTEOPOROSIS, IDIOPATHIC 08/11/2007   f/u at [facility] , now improved to osteopenia  . Raynaud's disease /phenomenon 08/16/2012  . Rosacea   . Shingles 09-2014  . Varicose veins     Family History  Problem Relation Age of Onset  . COPD Father        Died, 71  . Hypertension Father   . Stroke Father   . CAD Father   . Diabetes Mother   . Hypertension Mother   . CAD Mother           . Colon polyps Mother   . Cancer Mother        Ovarian cancer  . Hypertension Sister   . Fibromyalgia Sister   . Hyperlipidemia Sister   . Colon cancer Maternal Aunt   . Prostate cancer Neg Hx     Past Surgical History:  Procedure Laterality Date  . BLEPHAROPLASTY     B blepharoplasty @ [facility] 08-2011  . CERVICAL FUSION  1994   C4-C5-C6 fusion  . IR GENERIC HISTORICAL  03/17/2016   IR EMBO VENOUS NOT HEMORR HEMANG  INC GUIDE ROADMAPPING 03/17/2016 Greggory Keen, MD [facility] ULTRASOUND  . TRANSURETHRAL RESECTION OF PROSTATE  07-2009   Social History   Occupational History  . Occupation: real state   Tobacco Use  . Smoking status: Never Smoker  . Smokeless tobacco: Never Used  Substance and Sexual Activity  . Alcohol use: No    Alcohol/week: 0.0 oz  . Drug use: No  . Sexual activity: Not on file

## 2017-07-12 ENCOUNTER — Inpatient Hospital Stay
Admission: RE | Admit: 2017-07-12 | Discharge: 2017-07-12 | Disposition: A | Discharge status: Home / Self Care | Payer: 59 | Source: Ambulatory Visit | Attending: Family Medicine | Admitting: Family Medicine

## 2017-07-26 ENCOUNTER — Inpatient Hospital Stay
Admission: RE | Admit: 2017-07-26 | Discharge: 2017-07-26 | Disposition: A | Discharge status: Home / Self Care | Payer: 59 | Source: Ambulatory Visit | Attending: Family Medicine | Admitting: Family Medicine

## 2017-07-28 DIAGNOSIS — N411 Chronic prostatitis: Secondary | ICD-10-CM | POA: Diagnosis not present

## 2017-07-28 DIAGNOSIS — N401 Enlarged prostate with lower urinary tract symptoms: Secondary | ICD-10-CM | POA: Diagnosis not present

## 2017-08-09 DIAGNOSIS — H01001 Unspecified blepharitis right upper eyelid: Secondary | ICD-10-CM | POA: Diagnosis not present

## 2017-08-09 DIAGNOSIS — H353121 Nonexudative age-related macular degeneration, left eye, early dry stage: Secondary | ICD-10-CM | POA: Diagnosis not present

## 2017-08-09 DIAGNOSIS — H353112 Nonexudative age-related macular degeneration, right eye, intermediate dry stage: Secondary | ICD-10-CM | POA: Diagnosis not present

## 2017-08-09 DIAGNOSIS — H52223 Regular astigmatism, bilateral: Secondary | ICD-10-CM | POA: Diagnosis not present

## 2017-08-09 DIAGNOSIS — H524 Presbyopia: Secondary | ICD-10-CM | POA: Diagnosis not present

## 2017-08-09 DIAGNOSIS — H04123 Dry eye syndrome of bilateral lacrimal glands: Secondary | ICD-10-CM | POA: Diagnosis not present

## 2017-08-09 DIAGNOSIS — H5213 Myopia, bilateral: Secondary | ICD-10-CM | POA: Diagnosis not present

## 2017-08-09 DIAGNOSIS — H40013 Open angle with borderline findings, low risk, bilateral: Secondary | ICD-10-CM | POA: Diagnosis not present

## 2017-08-09 DIAGNOSIS — H01004 Unspecified blepharitis left upper eyelid: Secondary | ICD-10-CM | POA: Diagnosis not present

## 2017-08-19 ENCOUNTER — Ambulatory Visit: Payer: 59 | Admitting: Rheumatology

## 2017-08-27 NOTE — Progress Notes (Signed)
Office Visit Note  Patient: Erik [name]             Date of Birth: [birth year]           MRN: [id number]             PCP: Colon Branch, MD Referring: Colon Branch, MD Visit Date: 09/06/2017 Occupation: Media planner    Subjective:  Erik [name].   History of Present Illness: Erik [name] is a 62 y.o. male with history of Erik [name], DDD and osteoporosis.  According to patient his symptoms started about 15-20 years ago with the Erik [name].  He states the symptoms gradually got worse.  In 2014 he recalls that his fingers turn white.  At the time he was seen by his PCP and then was referred to Dr. Charlestine Night..  He had extensive lab work which was within normal limits.  He was placed on amlodipine which he was taking on as needed basis as it caused fatigue.  He also used warm clothing outdoors and indoors.  He continues to have significant Erik [name]. He also gives history of disc disease of C-spine and lumbar spine for years.  He had C4-5 and C5-6 fusion in 1994 and lumbar spine microdiscectomy at L4-5 level in 1998.  He states he had poor healing after the microdiscectomy and he was referred to [facility] endocrinology where he was diagnosed with osteoporosis.  He was treated with testosterone, vitamin D and bisphosphonates.  He recalls being on Fosamax and Actonel and also some drug studies.  His last DEXA scan showed he was in osteopenia range.  He has been going to endocrinology once a year now.   He recalls in activities of Daily Living:  Patient reports morning stiffness for 0 minute.   Patient Denies nocturnal pain.  Difficulty dressing/grooming: Denies Difficulty climbing stairs: Denies Difficulty getting out of chair: Denies Difficulty using hands for taps, buttons, cutlery, and/or writing: Denies   Review of Systems  Constitutional: Negative for fatigue, night sweats and weakness ( ).  HENT: Negative for mouth sores, mouth dryness and  nose dryness.   Eyes: Positive for dryness. Negative for redness.  Respiratory: Negative for shortness of breath and difficulty breathing.   Cardiovascular: Negative for chest pain, palpitations, hypertension, irregular heartbeat and swelling in legs/feet.  Gastrointestinal: Negative for constipation and diarrhea.  Endocrine: Negative for increased urination.  Musculoskeletal: Negative for arthralgias, joint pain, joint swelling, myalgias, muscle weakness, morning stiffness, muscle tenderness and myalgias.  Skin: Positive for color change. Negative for rash, hair loss, nodules/bumps, skin tightness, ulcers and sensitivity to sunlight.  Allergic/Immunologic: Negative for susceptible to infections.  Neurological: Negative for dizziness, fainting, memory loss and night sweats.  Hematological: Negative for swollen glands.  Psychiatric/Behavioral: Negative for depressed mood and sleep disturbance. The patient is not nervous/anxious.     PMFS History:  Patient Active Problem List   Diagnosis Date Noted  . Lumbar radiculopathy 03/25/2017  . Degenerative cervical disc 06/08/2016  . Degenerative lumbar disc 06/08/2016  . PCP NOTES >>> 04/29/2015  . Binocular vision disorder with diplopia 10/01/2014  . 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 Herpes zoster-- 09-2014 09/05/2014  . Anal fissure 02/28/2014  . Chronic prostatitis 02/28/2014  . Abnormal prostate specific antigen 02/28/2014  . Acid reflux 02/28/2014  . Ganglion of tendon 02/28/2014  . Incomplete bladder emptying 02/28/2014  . FOM (frequency of micturition) 02/28/2014  . Nonexudative age-related  macular degeneration 02/12/2014  . 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 Speech abnormality 02/13/2013  . Raynaud [name] 08/16/2012  . Annual physical exam 10/21/2011  . Neck -- back pain- UDS 09/18/2011  . ALLERGIC RHINITIS 12/19/2009  . PELVIC PAIN, CHRONIC 04/03/2009  .  Hypogonadism -- per urology 08/11/2007  . OSTEOPOROSIS, IDIOPATHIC 08/11/2007  . HEADACHE 08/11/2007    Past Medical History:  Diagnosis Date  . 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 ALLERGIC RHINITIS 12/19/2009  . Back pain chronic    On hydrocodone-Neurontin  . Blepharitis    B, chronic  . BPH (benign prostatic hyperplasia)   . Chronic prostatitis     f/u by urology, has a prostate massage prn  . Groin injury   . HYPOGONADISM, MALE 08/11/2007   f/u by Dr Thomasene Mohair urology    . OSTEOPOROSIS, IDIOPATHIC 08/11/2007   f/u at [facility] , now improved to osteopenia  . Erik disease /[name] 08/16/2012  . Rosacea   . Shingles 09-2014  . Varicose veins     Family History  Problem Relation Age of Onset  . COPD Father        Died, 71  . Hypertension Father   . Stroke Father   . CAD Father   . Diabetes Mother   . Hypertension Mother   . CAD Mother           . Colon polyps Mother   . Cancer Mother        Ovarian cancer  . Hypertension Sister   . High Cholesterol Sister   . Colon cancer Maternal Aunt   . Prostate cancer Neg Hx    Past Surgical History:  Procedure Laterality Date  . BLEPHAROPLASTY     B blepharoplasty @ [facility] 08-2011  . CERVICAL DISCECTOMY  1998  . CERVICAL FUSION  1994   C4-C5-C6 fusion  . EYE SURGERY     eyelid surgery on both eyes  . HERNIA REPAIR Left 1979  . IR GENERIC HISTORICAL  03/17/2016   IR EMBO VENOUS NOT HEMORR HEMANG  INC GUIDE ROADMAPPING 03/17/2016 Greggory Keen, MD [facility]TRASOUND  . KNEE ARTHROSCOPY    . SHOULDER SURGERY Right    early 2000s  . TRANSURETHRAL RESECTION OF PROSTATE  07-2009   Social History   Social History Narrative   Lives with wife.  They have three children.   Daughter is an Ship broker       Objective: Vital Signs: BP 103/69 (BP Location: Left Arm, Patient Position: Sitting, Cuff Size: Normal)   Pulse 67   Resp 15   Ht 6' (1.829 m)   Wt 191 lb (86.6 kg)   BMI 25.90 kg/m    Physical Exam    Constitutional: He is oriented to person, place, and time. He appears well-developed and well-nourished.  HENT:  Head: Normocephalic and atraumatic.  Eyes: Conjunctivae and EOM are normal. Pupils are equal, round, and reactive to light.  Neck: Normal range of motion. Neck supple.  Cardiovascular: Normal rate, regular rhythm and normal heart sounds.  Pulmonary/Chest: Effort normal and breath sounds normal.  Abdominal: Soft. Bowel sounds are normal.  Neurological: He is alert and oriented to person, place, and time.  Skin: Skin is warm and dry. Capillary refill takes 2 to 3 seconds.  Psychiatric: He has a normal mood and affect. His behavior is normal.  Nursing note and vitals reviewed.    Musculoskeletal Exam: C-spine thoracic and lumbar spine limited range of motion with some  discomfort.  Shoulder joints elbow joints wrist joint MCPs PIPs DIPs are good range of motion with no synovitis.  Hip joints knee joints ankles MTPs PIPs DIPs are good range of motion with no synovitis.  CDAI Exam: No CDAI exam completed.    Investigation: Findings:  Patient brought films of his bilateral hands from May 21, 2014 obtained by Dr. Charlestine Night which showed minimal PIP narrowing no MCP changes no intercarpal or radiocarpal joint space narrowing no erosive changes were noted.  Mild CMC spurring was noted.  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   Component     Latest Ref Rng & Units 03/25/2017  Iron     42 - 165 ug/dL 90  Transferrin     212.0 - 360.0 mg/dL 202.0 (L)  Saturation Ratios     20.0 - 50.0 % 31.8  Sed Rate     0 - 20 mm/hr 1  CRP     0.5 - 20.0 mg/dL 0.9  VITD     30.00 - 100.00 ng/mL 31.59  TSH     0.35 - 4.50 uIU/mL 2.17  Anit Nuclear Antibody(ANA)     NEGATIVE NEGATIVE  RA Latex Turbid.     <14 IU/mL <14  Angiotensin-Converting Enzyme     9 - 67 U/L 38  Cyclic Citrullin Peptide Ab     UNITS <16   CBC Latest Ref Rng & Units  03/25/2017 06/06/2015 04/24/2013  WBC 4.0 - 10.5 K/uL 4.8 6.7 4.1(L)  Hemoglobin 13.0 - 17.0 g/dL 16.4 15.5 16.2  Hematocrit 39.0 - 52.0 % 48.0 46.3 46.8  Platelets 150.0 - 400.0 K/uL 208.0 228.0 196.0   CMP Latest Ref Rng & Units 03/25/2017 03/25/2017 05/26/2016  Glucose 70 - 99 mg/dL - 98 107(H)  BUN 6 - 23 mg/dL - 18 21  Creatinine 0.40 - 1.50 mg/dL - 0.85 0.82  Sodium 135 - 145 mEq/L - 140 141  Potassium 3.5 - 5.1 mEq/L - 4.1 4.4  Chloride 96 - 112 mEq/L - 104 107  CO2 19 - 32 mEq/L - 28 29  Calcium 8.6 - 10.3 mg/dL 8.5(L) 8.7 8.9  Total Protein 6.0 - 8.3 g/dL - 6.2 -  Total Bilirubin 0.2 - 1.2 mg/dL - 1.2 -  Alkaline Phos 39 - 117 U/L - 64 -  AST 0 - 37 U/L - 26 -  ALT 0 - 53 U/L - 35 -    Imaging: No results found.  Speciality Comments: No specialty comments available.    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  He does have decreased capillary refill on examination.  His hands and feet were cold to touch.  No digital ulcers were noted.  To complete the workup I will obtain following labs.  I have encouraged the use of amlodipine as needed.  He does not like to take amlodipine on a regular basis due to fatigue.  Keeping core temperature warm was discussed.  Avoidance of caffeine was discussed.  Plan: CK, Cryoglobulin, Pan-ANCA, Beta-2 glycoprotein antibodies, Cardiolipin antibodies, IgG, IgM, IgA, Lupus Anticoagulant Eval w/Reflex, Anti-DNA antibody, double-stranded, RNP Antibody, Anti-Smith antibody, Anti-scleroderma antibody, Hepatitis B core antibody, IgM, Hepatitis B surface antigen, Hepatitis C antibody  DDD (degenerative disc disease), cervical - Status post C4-5 and C5-6 fusion.  He has limited range of motion without much discomfort.  DDD (degenerative disc disease), lumbar - Status post L4-5 discectomy  1998.  He has chronic pain.  Paralysis of right common peroneal nerve: He experiences some weakness in his right lower extremity.  Adhesive capsulitis of left shoulder: He has good range of motion.  Age-related osteoporosis without current pathological fracture - Diagnosed in 1998 and treated with testosterone, vitamin D and bisphosphonates.  DXA narrowing osteopenia range per patient.  His last vitamin D was low normal.  I have advised him to continue with the supplements and exercise.  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 CK  . Cryoglobulin  . Pan-ANCA  . Beta-2 glycoprotein antibodies  . Cardiolipin antibodies, IgG, IgM, IgA  . Lupus Anticoagulant Eval w/Reflex  . Anti-DNA antibody, double-stranded  . RNP Antibody  . Anti-Smith antibody  . Anti-scleroderma antibody  . Hepatitis B core antibody, IgM  . Hepatitis B surface antigen  . Hepatitis C antibody   No orders of the defined types were placed in this encounter.   Face-to-face time spent with patient was 45 minutes.  Greater than 50% of time was spent in counseling and coordination of care.  Follow-Up Instructions: Return for Raynauds, DJD, osteoporosis.   Bo Merino, MD  Note - This record has been created using Editor, commissioning.  Chart creation errors have been sought, but may not always  have been located. Such creation errors do not reflect on  the standard of medical care.

## 2017-08-30 MED FILL — SHINGRIX 50 MCG SUS: 50 | 1 days supply | Qty: 1 | Fill #1

## 2017-09-03 ENCOUNTER — Ambulatory Visit (INDEPENDENT_AMBULATORY_CARE_PROVIDER_SITE_OTHER): Payer: 59

## 2017-09-03 DIAGNOSIS — Z23 Encounter for immunization: Secondary | ICD-10-CM

## 2017-09-03 NOTE — Progress Notes (Signed)
Pre visit review using our clinic review tool, if applicable. No additional management support is needed unless otherwise documented below in the visit note.  Pt here today for Shingrix #2. He brings w/ him 2 vials: 1 containing suspension component, and 1 containing lyophilized gE Antigen component. 0.45mL after reconstitution injected into R deltoid. Pt tolerated injection well. Instructed Pt to look out of pain, rash, redness of injection site. Pt verbalized understanding.

## 2017-09-05 ENCOUNTER — Encounter: Payer: Self-pay | Admitting: Internal Medicine

## 2017-09-06 ENCOUNTER — Encounter: Payer: Self-pay | Admitting: Rheumatology

## 2017-09-06 ENCOUNTER — Ambulatory Visit: Payer: 59 | Admitting: Rheumatology

## 2017-09-06 VITALS — BP 103/69 | HR 67 | Resp 15 | Ht 72.0 in | Wt 191.0 lb

## 2017-09-06 DIAGNOSIS — M7502 Adhesive capsulitis of left shoulder: Secondary | ICD-10-CM

## 2017-09-06 DIAGNOSIS — I7300 Raynaud's syndrome without gangrene: Secondary | ICD-10-CM | POA: Diagnosis not present

## 2017-09-06 DIAGNOSIS — M5136 Other intervertebral disc degeneration, lumbar region: Secondary | ICD-10-CM

## 2017-09-06 DIAGNOSIS — M810 Age-related osteoporosis without current pathological fracture: Secondary | ICD-10-CM | POA: Diagnosis not present

## 2017-09-06 DIAGNOSIS — H353190 Nonexudative age-related macular degeneration, unspecified eye, stage unspecified: Secondary | ICD-10-CM

## 2017-09-06 DIAGNOSIS — G5731 Lesion of lateral popliteal nerve, right lower limb: Secondary | ICD-10-CM | POA: Diagnosis not present

## 2017-09-06 DIAGNOSIS — E559 Vitamin D deficiency, unspecified: Secondary | ICD-10-CM | POA: Diagnosis not present

## 2017-09-06 DIAGNOSIS — Z872 Personal history of diseases of the skin and subcutaneous tissue: Secondary | ICD-10-CM | POA: Diagnosis not present

## 2017-09-06 DIAGNOSIS — H40023 Open angle with borderline findings, high risk, bilateral: Secondary | ICD-10-CM | POA: Diagnosis not present

## 2017-09-06 DIAGNOSIS — M5030 Other cervical disc degeneration, unspecified cervical region: Secondary | ICD-10-CM

## 2017-09-06 DIAGNOSIS — N419 Inflammatory disease of prostate, unspecified: Secondary | ICD-10-CM | POA: Diagnosis not present

## 2017-09-10 NOTE — Progress Notes (Signed)
We will discuss labs at the follow-up visit.

## 2017-09-12 LAB — BETA-2 GLYCOPROTEIN ANTIBODIES

## 2017-09-12 LAB — PAN-ANCA: ANCA Screen: POSITIVE — AB

## 2017-09-12 LAB — CK: CK TOTAL: 53 U/L (ref 44–196)

## 2017-09-12 LAB — ANTI-SCLERODERMA ANTIBODY: SCLERODERMA (SCL-70) (ENA) ANTIBODY, IGG: NEGATIVE AI

## 2017-09-12 LAB — RNP ANTIBODY: RIBONUCLEIC PROTEIN(ENA) ANTIBODY, IGG: NEGATIVE AI

## 2017-09-12 LAB — LUPUS ANTICOAGULANT EVAL W/ REFLEX
PTT LA SCREEN: 39 s (ref <=40)
dRVVT Screen: 48 s — ABNORMAL HIGH (ref <=45)

## 2017-09-12 LAB — HEPATITIS C ANTIBODY
Hepatitis C Ab: NONREACTIVE
SIGNAL TO CUT-OFF: 0.01 (ref <1.00)

## 2017-09-12 LAB — CARDIOLIPIN ANTIBODIES, IGG, IGM, IGA: Anticardiolipin IgA: <11 [APL'U]

## 2017-09-12 LAB — HEPATITIS B SURFACE ANTIGEN: Hepatitis B Surface Ag: NONREACTIVE

## 2017-09-12 LAB — RFX DRVVT 1:1 MIX

## 2017-09-12 LAB — CRYOGLOBULIN: Cryoglobulin, Qualitative Analysis: NOT DETECTED

## 2017-09-12 LAB — HEPATITIS B CORE ANTIBODY, IGM: Hep B C IgM: NONREACTIVE

## 2017-09-12 LAB — ANTI-DNA ANTIBODY, DOUBLE-STRANDED

## 2017-09-12 LAB — RFLX DRVVT CONFRIM: DRVVT CONFIRM: POSITIVE — AB

## 2017-09-12 LAB — ANTI-SMITH ANTIBODY: ENA SM AB SER-ACNC: NEGATIVE AI

## 2017-09-13 DIAGNOSIS — N411 Chronic prostatitis: Secondary | ICD-10-CM | POA: Diagnosis not present

## 2017-09-20 ENCOUNTER — Other Ambulatory Visit (INDEPENDENT_AMBULATORY_CARE_PROVIDER_SITE_OTHER): Payer: 59

## 2017-09-20 ENCOUNTER — Ambulatory Visit: Payer: 59 | Admitting: Physician Assistant

## 2017-09-20 ENCOUNTER — Encounter: Payer: Self-pay | Admitting: Physician Assistant

## 2017-09-20 VITALS — BP 110/60 | HR 76 | Ht 70.75 in | Wt 194.2 lb

## 2017-09-20 DIAGNOSIS — K6289 Other specified diseases of anus and rectum: Secondary | ICD-10-CM

## 2017-09-20 DIAGNOSIS — K648 Other hemorrhoids: Secondary | ICD-10-CM

## 2017-09-20 LAB — COMPREHENSIVE METABOLIC PANEL
ALK PHOS: 64 U/L (ref 39–117)
ALT: 19 U/L (ref 0–53)
AST: 18 U/L (ref 0–37)
Albumin: 4.1 g/dL (ref 3.5–5.2)
BUN: 24 mg/dL — ABNORMAL HIGH (ref 6–23)
CALCIUM: 9.2 mg/dL (ref 8.4–10.5)
CO2: 29 mEq/L (ref 19–32)
Chloride: 102 mEq/L (ref 96–112)
Creatinine, Ser: 0.77 mg/dL (ref 0.40–1.50)
GFR: 108.29 mL/min (ref >60.00)
Glucose, Bld: 102 mg/dL — ABNORMAL HIGH (ref 70–99)
POTASSIUM: 3.8 meq/L (ref 3.5–5.1)
Sodium: 139 mEq/L (ref 135–145)
TOTAL PROTEIN: 6.4 g/dL (ref 6.0–8.3)
Total Bilirubin: 1.4 mg/dL — ABNORMAL HIGH (ref 0.2–1.2)

## 2017-09-20 LAB — CBC WITH DIFFERENTIAL/PLATELET
BASOS ABS: 0 10*3/uL (ref 0.0–0.1)
Basophils Relative: 1 % (ref 0.0–3.0)
EOS PCT: 1.2 % (ref 0.0–5.0)
Eosinophils Absolute: 0 10*3/uL (ref 0.0–0.7)
HCT: 43.6 % (ref 39.0–52.0)
HEMOGLOBIN: 15.2 g/dL (ref 13.0–17.0)
Lymphocytes Relative: 33.2 % (ref 12.0–46.0)
Lymphs Abs: 1.2 10*3/uL (ref 0.7–4.0)
MCHC: 35 g/dL (ref 30.0–36.0)
MCV: 89.4 fl (ref 78.0–100.0)
MONOS PCT: 8.7 % (ref 3.0–12.0)
Monocytes Absolute: 0.3 10*3/uL (ref 0.1–1.0)
Neutro Abs: 2 10*3/uL (ref 1.4–7.7)
Neutrophils Relative %: 55.9 % (ref 43.0–77.0)
Platelets: 184 10*3/uL (ref 150.0–400.0)
RBC: 4.88 Mil/uL (ref 4.22–5.81)
RDW: 13.1 % (ref 11.5–15.5)
WBC: 3.7 10*3/uL — ABNORMAL LOW (ref 4.0–10.5)

## 2017-09-20 MED ORDER — LIDOCAINE 5 % EX OINT: 1.0000 "application " | TOPICAL_OINTMENT | CUTANEOUS | 0 refills | Status: DC | PRN

## 2017-09-20 MED ORDER — CIPROFLOXACIN HCL 500 MG PO TABS: 500.0000 mg | ORAL_TABLET | Freq: Two times a day (BID) | ORAL | 0 refills | Status: DC

## 2017-09-20 MED ORDER — METRONIDAZOLE 500 MG PO TABS: 500.0000 mg | ORAL_TABLET | Freq: Three times a day (TID) | ORAL | 0 refills | Status: AC

## 2017-09-20 MED FILL — CIPROFLOXACIN HCL 500 MG TA: 500 | 7 days supply | Qty: 14 | Fill #0

## 2017-09-20 MED FILL — metroNIDAZOLE 500 MG TABS: 500 | 7 days supply | Qty: 21 | Fill #0

## 2017-09-20 NOTE — Progress Notes (Signed)
Chief Complaint: Rectal pain  HPI:    [name] is a 62 year old Caucasian male, who follows with Dr. Ardis Hughs, with history of chronic prostatitis and need for prostate massage as well as vague history of fissures and fistulas per the patient in the past, who was referred to me by Colon Branch, MD for a complaint of rectal pain.      Colonoscopy 10/28/15 for personal history of colon polyps with finding of one 4 mm polyp in the descending colon and otherwise normal exam.  Pathology tubular adenoma.  Recall recommended in 5 years.    Today, explains he has a history of fissures and fistulas treated by [facility] in the past, none in the past 2 years.  Over the past 4 months, has developed rectal pain which he feels worse on the left side of the inside of his rectum and describes this as a dull ache which hurts worse when he is sitting.  Rated as a 6-7/10.  Worse at night and keeps him from sleeping.  He describes seeing his urologist recently who performed his scheduled prostate massage as he thought this may be related to the pain, but there was no change in pain after this visit at the beginning of the month.  Patient denies seeing any bright red blood or any discharge from his rectum.  Thinks this feels somewhat similar to fistulas in the past.  Has not tried any over-the-counter medications for this.  Describes daily soft solid bowel movements.    Denies fever, chills, weight loss, nausea, vomiting or abdominal pain.  Past Medical History:  Diagnosis Date  . 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 ALLERGIC RHINITIS 12/19/2009  . Back pain chronic    On hydrocodone-Neurontin  . Blepharitis    B, chronic  . BPH (benign prostatic hyperplasia)   . Chronic prostatitis     f/u by urology, has a prostate massage prn  . Groin injury   . HYPOGONADISM, MALE 08/11/2007   f/u by Dr Thomasene Mohair urology    . OSTEOPOROSIS, IDIOPATHIC 08/11/2007   f/u at [facility] , now improved to  osteopenia  . Raynaud's disease /phenomenon 08/16/2012  . Rosacea   . Shingles 09-2014  . Varicose veins     Past Surgical History:  Procedure Laterality Date  . BLEPHAROPLASTY     B blepharoplasty @ [facility] 08-2011  . CERVICAL DISCECTOMY  1998  . CERVICAL FUSION  1994   C4-C5-C6 fusion  . EYE SURGERY     eyelid surgery on both eyes  . HERNIA REPAIR Left 1979  . IR GENERIC HISTORICAL  03/17/2016   IR EMBO VENOUS NOT HEMORR HEMANG  INC GUIDE ROADMAPPING 03/17/2016 Greggory Keen, MD [facility] ULTRASOUND  . KNEE ARTHROSCOPY    . SHOULDER SURGERY Right    early 2000s  . TRANSURETHRAL RESECTION OF PROSTATE  07-2009    Current Outpatient Medications  Medication Sig Dispense Refill  . azelastine (ASTELIN) 0.1 % nasal spray Place 2 sprays into both nostrils at bedtime as needed for rhinitis. Use in each nostril as directed 30 mL 3  . gabapentin (NEURONTIN) 300 MG capsule Take 1 capsule (300 mg total) by mouth at bedtime. 90 capsule 3  . loratadine (CLARITIN) 10 MG tablet Take 10 mg by mouth daily.    . Multiple Vitamin (MULTI-VITAMINS) TABS Take by mouth.    Marland Kitchen omeprazole (PRILOSEC) 40 MG capsule Take 1 capsule (40 mg total) by mouth daily. (Patient taking differently: Take  40 mg by mouth as needed. ) 90 capsule 3  . testosterone cypionate (DEPOTESTOTERONE CYPIONATE) 100 MG/ML injection Inject into the muscle every 14 (fourteen) days. For IM use only    . traMADol (ULTRAM) 50 MG tablet TAKE 1 TABLET BY MOUTH EVERY 8 HOURS AS NEEDED 30 tablet 0  . Vitamin D, Ergocalciferol, (DRISDOL) 50000 units CAPS capsule Take 1 capsule (50,000 Units total) by mouth every 7 (seven) days. 12 capsule 0  . ciprofloxacin (CIPRO) 500 MG tablet Take 1 tablet (500 mg total) by mouth 2 (two) times daily. 14 tablet 0  . lidocaine (XYLOCAINE) 5 % ointment Apply 1 application topically as needed. 35.44 g 0  . metroNIDAZOLE (FLAGYL) 500 MG tablet Take 1 tablet (500 mg total) by mouth 3 (three) times daily for 7 days. 21 tablet  0   No current facility-administered medications for this visit.     Allergies as of 09/20/2017 - Review Complete 09/20/2017  Allergen Reaction Noted  . Sulfa drugs cross reactors Nausea And Vomiting 10/21/2011    Family History  Problem Relation Age of Onset  . COPD Father        Died, 71  . Hypertension Father   . Stroke Father   . CAD Father   . Diabetes Mother   . Hypertension Mother   . CAD Mother           . Colon polyps Mother   . Cancer Mother        Ovarian cancer  . Hypertension Sister   . High Cholesterol Sister   . Colon cancer Maternal Aunt   . Prostate cancer Neg Hx     Social History   Socioeconomic History  . Marital status: Married    Spouse name: Not on file  . Number of children: 3  . Years of education: Not on file  . Highest education level: Not on file  Social Needs  . Financial resource strain: Not on file  . Food insecurity - worry: Not on file  . Food insecurity - inability: Not on file  . Transportation needs - medical: Not on file  . Transportation needs - non-medical: Not on file  Occupational History  . Occupation: [employer]   Tobacco Use  . Smoking status: Never Smoker  . Smokeless tobacco: Never Used  Substance and Sexual Activity  . Alcohol use: No    Alcohol/week: 0.0 oz  . Drug use: No  . Sexual activity: Not on file  Other Topics Concern  . Not on file  Social History Narrative   Lives with wife.  They have three children.   Daughter is an Ship broker      Review of Systems:    Constitutional: No weight loss, fever or chills Skin: No rash Cardiovascular: No chest pain Respiratory: No SOB  Gastrointestinal: See HPI and otherwise negative   Physical Exam:  Vital signs: BP 110/60 (BP Location: Left Arm, Patient Position: Sitting, Cuff Size: Normal)   Pulse 76   Ht 5' 10.75" (1.797 m) Comment: height measured without shoes  Wt 194 lb 4 oz (88.1 kg)   BMI 27.28 kg/m   Constitutional:   Pleasant Caucasian male  appears to be in NAD, Well developed, Well nourished, alert and cooperative Respiratory: Respirations even and unlabored. Lungs clear to auscultation bilaterally.   No wheezes, crackles, or rhonchi.  Cardiovascular: Normal S1, S2. No MRG. Regular rate and rhythm. No peripheral edema, cyanosis or pallor.  Gastrointestinal:  Soft, nondistended, nontender. No  rebound or guarding. Normal bowel sounds. No appreciable masses or hepatomegaly. Rectal:  External: hemorrhoid tag; Internal: TTP on left side; Anoscopy: Grade II internal hemorrhoids, some visualized purulent material, tender Psychiatric: Demonstrates good judgement and reason without abnormal affect or behaviors.  No recent labs.  Assessment: 1.  Rectal pain: Suspect possible abscess versus fistulous tract 2.  Internal hemorrhoids  Plan: 1.  Patient was seen by myself and Dr. Henrene Pastor at the time of his appointment.  Dr. Henrene Pastor did perform anoscopy as well. 2.  Ordered MRI of the pelvis with and without contrast for further evaluation of any abscess or fistula. 3.  Prescribed Ciprofloxacin 500 mg twice daily times 7 days #14.  Prescribed Metronidazole 500 mg 3 times daily times 7 days #21. 4.  Prescribed Lidocaine ointment to be applied internally for some relief of pain. 5. Ordered CBC/CMP 6.  Patient to follow in clinic per recommendations after imaging above with Dr. Ardis Hughs or myself.  Ellouise Newer, PA-C [facility] 09/20/2017, 10:35 AM  Cc: Colon Branch, MD

## 2017-09-20 NOTE — Progress Notes (Signed)
I agree with the above note, plan 

## 2017-09-20 NOTE — Patient Instructions (Signed)
If you are age 64 or older, your body mass index should be between 23-30. Your Body mass index is 27.28 kg/m. If this is out of the aforementioned range listed, please consider follow up with your Primary Care Provider.  If you are age 41 or younger, your body mass index should be between 19-25. Your Body mass index is 27.28 kg/m. If this is out of the aformentioned range listed, please consider follow up with your Primary Care Provider.   Your physician has requested that you go to the basement for  lab work before leaving today.  We have sent the following medications to your pharmacy for you to pick up at your convenience:  Lidocaine Cream as needed for pain, insert into rectum/ tip of finger. Cipro 500mg  twice daily x7 days. Flagyl 500mg  three times daily x7 days.   You have been scheduled for an MRI at [facility] on 09/24/17. Your appointment time is 12:00pm. Please arrive 15 minutes prior to your appointment time for registration purposes. Please make certain not to have anything to eat or drink 6 hours prior to your test. In addition, if you have any metal in your body, have a pacemaker or defibrillator, please be sure to let your ordering physician know. This test typically takes 45 minutes to 1 hour to complete. Should you need to reschedule, please call [phone] to do so.

## 2017-09-21 ENCOUNTER — Ambulatory Visit: Payer: 59 | Admitting: Rheumatology

## 2017-09-22 MED FILL — TESTOSTERONE CYP 200 MG/ML: 200 | 84 days supply | Qty: 6 | Fill #0

## 2017-09-23 MED FILL — LIDOCAINE 3% CREAM: 3 | 30 days supply | Qty: 85 | Fill #0

## 2017-09-24 ENCOUNTER — Ambulatory Visit (HOSPITAL_COMMUNITY)
Admission: RE | Admit: 2017-09-24 | Discharge: 2017-09-24 | Disposition: A | Discharge status: Home / Self Care | Payer: 59 | Source: Ambulatory Visit | Attending: Physician Assistant | Admitting: Physician Assistant

## 2017-09-24 DIAGNOSIS — K6289 Other specified diseases of anus and rectum: Secondary | ICD-10-CM | POA: Diagnosis not present

## 2017-09-24 DIAGNOSIS — K5000 Crohn's disease of small intestine without complications: Secondary | ICD-10-CM | POA: Diagnosis not present

## 2017-09-24 DIAGNOSIS — K648 Other hemorrhoids: Secondary | ICD-10-CM | POA: Insufficient documentation

## 2017-09-24 DIAGNOSIS — Z9079 Acquired absence of other genital organ(s): Secondary | ICD-10-CM | POA: Diagnosis not present

## 2017-09-24 MED ORDER — GADOBENATE DIMEGLUMINE 529 MG/ML IV SOLN
20.0000 mL | Freq: Once | INTRAVENOUS | Status: AC | PRN
  Administered 2017-09-24: 19 mL via INTRAVENOUS

## 2017-09-26 NOTE — Progress Notes (Signed)
[name] [address], [place] [address] Phone: [phone] Subjective:    I'm seeing this patient by the request  of:    CC: Arm pain and neck pain follow-up  [id number]  Erik [name] is a 62 y.o. male coming in with complaint of bilateral arm pain. His pain is about the same as last visit and has been keeping him awake at night.  Patient is concerned because it seems to be worsening.  Denies any weakness in the hand but feels like sometimes it is difficult to grip things.  Patient states every night severe amount of pain but sometimes he does not sleep in the bed anymore.  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 Recent laboratory workup was fairly unremarkable.  Seen rheumatology as well with no significant findings.   MRI on July 07, 2016 of the cervical spine independently visualized by me.  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  Past Medical History:  Diagnosis Date  . 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 ALLERGIC RHINITIS 12/19/2009  . Back pain chronic    On hydrocodone-Neurontin  . Blepharitis    B, chronic  . BPH (benign prostatic hyperplasia)   . Chronic prostatitis     f/u by urology, has a prostate massage prn  . Groin injury   . HYPOGONADISM, MALE 08/11/2007   f/u by Dr Thomasene Mohair urology    . OSTEOPOROSIS, IDIOPATHIC 08/11/2007   f/u at [facility]: [facility] , now improved to osteopenia  . Raynaud's disease /phenomenon 08/16/2012  . Rosacea   . Shingles 09-2014  . Varicose veins    Past Surgical History:  Procedure Laterality Date  . BLEPHAROPLASTY     B blepharoplasty @ [facility] 08-2011  . CERVICAL DISCECTOMY  1998  . CERVICAL FUSION  1994   C4-C5-C6 fusion  . EYE SURGERY     eyelid surgery on both eyes  . HERNIA REPAIR Left 1979  . IR GENERIC HISTORICAL  03/17/2016   IR EMBO VENOUS  NOT HEMORR HEMANG  INC GUIDE ROADMAPPING 03/17/2016 Greggory Keen, MD [facility] ULTRASOUND  . KNEE ARTHROSCOPY    . SHOULDER SURGERY Right    early 2000s  . TRANSURETHRAL RESECTION OF PROSTATE  07-2009   Social History   Socioeconomic History  . Marital status: Married    Spouse name: Not on file  . Number of children: 3  . Years of education: Not on file  . Highest education level: Not on file  Occupational History  . Occupation: [employer]   Social Needs  . Financial resource strain: Not on file  . Food insecurity:    Worry: Not on file    Inability: Not on file  . Transportation needs:    Medical: Not on file    Non-medical: Not on file  Tobacco Use  . Smoking status: Never Smoker  . Smokeless tobacco: Never Used  Substance and Sexual Activity  . Alcohol use: No    Alcohol/week: 0.0 oz  . Drug use: No  . Sexual activity: Not on file  Lifestyle  . Physical activity:    Days per week: Not on file    Minutes per session: Not on file  . Stress: Not on file  Relationships  . Social connections:    Talks on phone: Not on file    Gets together: Not  on file    Attends religious service: Not on file    Active member of club or organization: Not on file    Attends meetings of clubs or organizations: Not on file    Relationship status: Not on file  Other Topics Concern  . Not on file  Social History Narrative   Lives with wife.  They have three children.   Daughter is an Ship broker     Allergies  Allergen Reactions  . Sulfa Drugs Cross Reactors Nausea And Vomiting   Family History  Problem Relation Age of Onset  . COPD Father        Died, 71  . Hypertension Father   . Stroke Father   . CAD Father   . Diabetes Mother   . Hypertension Mother   . CAD Mother           . Colon polyps Mother   . Cancer Mother        Ovarian cancer  . Hypertension Sister   . High Cholesterol Sister   . Colon cancer Maternal Aunt   . Prostate cancer Neg Hx      Past medical  history, social, surgical and family history all reviewed in electronic medical record.  No pertanent information unless stated regarding to the chief complaint.   Review of Systems:Review of systems updated and as accurate as of 09/26/17  No headache, visual changes, nausea, vomiting, diarrhea, constipation, dizziness, abdominal pain, skin rash, fevers, chills, night sweats, weight loss, swollen lymph nodes, body aches, joint swelling,  chest pain, shortness of breath, mood changes.  Positive muscle aches  Objective  There were no vitals taken for this visit. Systems examined below as of 09/26/17   General: No apparent distress alert and oriented x3 mood and affect normal, dressed appropriately.  HEENT: Pupils equal, extraocular movements intact  Respiratory: Patient's speak in full sentences and does not appear short of breath  Cardiovascular: No lower extremity edema, non tender, no erythema  Skin: Warm dry intact with no signs of infection or rash on extremities or on axial skeleton.  Abdomen: Soft nontender  Neuro: Cranial nerves II through XII are intact, neurovascularly intact in all extremities with 2+ DTRs and 2+ pulses.  Lymph: No lymphadenopathy of posterior or anterior cervical chain or axillae bilaterally.  Gait normal with good balance and coordination.  MSK:  Non tender with full range of motion and good stability and symmetric strength and tone of shoulders, elbows, wrist, hip, knee and ankles bilaterally.  Early arthritic changes of multiple joints Neck exam shows mild limited range of motion in all planes especially with extension of 10 degrees.  Positive Spurling's.  Mild weakness in the C8 distribution right greater than left good grip strength seems to be intact.  Deep tendon reflexes intact.    Impression and Recommendations:     This case required medical decision making of moderate complexity.      Note: This dictation was prepared with Dragon dictation along  with smaller phrase technology. Any transcriptional errors that result from this process are unintentional.

## 2017-09-27 ENCOUNTER — Ambulatory Visit: Payer: 59 | Admitting: Family Medicine

## 2017-09-27 ENCOUNTER — Ambulatory Visit: Payer: Self-pay | Admitting: Family Medicine

## 2017-09-27 ENCOUNTER — Ambulatory Visit: Payer: Self-pay

## 2017-09-27 ENCOUNTER — Encounter: Payer: Self-pay | Admitting: Family Medicine

## 2017-09-27 VITALS — BP 104/74 | HR 61 | Ht 70.75 in | Wt 194.0 lb

## 2017-09-27 DIAGNOSIS — M79601 Pain in right arm: Secondary | ICD-10-CM | POA: Diagnosis not present

## 2017-09-27 DIAGNOSIS — M5030 Other cervical disc degeneration, unspecified cervical region: Secondary | ICD-10-CM

## 2017-09-27 DIAGNOSIS — M79602 Pain in left arm: Principal | ICD-10-CM

## 2017-09-27 NOTE — Assessment & Plan Note (Signed)
Worsening discomfort and pain.  Discussed icing regimen and home exercises.  Discussed which activities to doing which wants to avoid.  Increase activity as tolerated.  Follow-up with me again in 4 weeks patient will have an epidural done secondary to the increasing discomfort and pain.

## 2017-09-27 NOTE — Patient Instructions (Signed)
Good to see you  Knee compression sleeve on the right (look at [facility]) daily for 2 weeks.  Exercises 3 times a week.  We will get epidural for the neck.  See me again in 4 weeks

## 2017-10-01 MED FILL — GABAPENTIN 300 MG CAPSULE: 300 | 90 days supply | Qty: 90 | Fill #1

## 2017-10-08 DIAGNOSIS — M7502 Adhesive capsulitis of left shoulder: Secondary | ICD-10-CM | POA: Insufficient documentation

## 2017-10-08 DIAGNOSIS — I7300 Raynaud's syndrome without gangrene: Secondary | ICD-10-CM | POA: Insufficient documentation

## 2017-10-08 NOTE — Progress Notes (Signed)
Office Visit Note  Patient: Erik [name]             Date of Birth: 05-31-1956           MRN: [id number]             PCP: Colon Branch, MD Referring: Colon Branch, MD Visit Date: 10/20/2017 Occupation: @GUAROCC @    Subjective:  Raynaud's    History of Present Illness: Erik [name] is a 62 y.o. male history of Raynauds phenomenon and DDD.  He states that he continues to have some symptoms of Raynauds.  Especially with cold weather and recently with even warm weather he has noticed that his right index and middle finger turns white.  He denies any joint swelling.  He states last year he had some swelling in his left wrist joint and was told that he may have gout.  He continues to have some stiffness in his C-spine and lumbar spine.  He states that the left shoulder pain is manageable.  Activities of Daily Living:  Patient reports morning stiffness for 10 minutes.   Patient Denies nocturnal pain.  Difficulty dressing/grooming: Denies Difficulty climbing stairs: Denies Difficulty getting out of chair: Denies Difficulty using hands for taps, buttons, cutlery, and/or writing: Denies   Review of Systems  Constitutional: Positive for fatigue. Negative for night sweats.  HENT: Negative for mouth sores, mouth dryness and nose dryness.   Eyes: Negative for redness and dryness.  Respiratory: Negative for shortness of breath and difficulty breathing.   Cardiovascular: Negative for chest pain, palpitations, hypertension, irregular heartbeat and swelling in legs/feet.  Gastrointestinal: Negative for constipation and diarrhea.  Endocrine: Negative for increased urination.  Musculoskeletal: Negative for arthralgias, joint pain, joint swelling, myalgias, muscle weakness, morning stiffness, muscle tenderness and myalgias.  Skin: Positive for color change. Negative for rash, hair loss, nodules/bumps, skin tightness, ulcers and sensitivity to sunlight.  Allergic/Immunologic: Negative for  susceptible to infections.  Neurological: Negative for dizziness, fainting, memory loss, night sweats and weakness ( ).  Hematological: Negative for swollen glands.  Psychiatric/Behavioral: Positive for sleep disturbance. Negative for depressed mood. The patient is not nervous/anxious.     PMFS History:  Patient Active Problem List   Diagnosis Date Noted  . Raynaud's disease without gangrene 10/08/2017  . 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 Lumbar radiculopathy 03/25/2017  . Degenerative cervical disc 06/08/2016  . Degenerative lumbar disc 06/08/2016  . PCP NOTES >>> 04/29/2015  . Binocular vision disorder with diplopia 10/01/2014  . 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 Herpes zoster-- 09-2014 09/05/2014  . Anal fissure 02/28/2014  . Chronic prostatitis 02/28/2014  . Abnormal prostate specific antigen 02/28/2014  . Acid reflux 02/28/2014  . Ganglion of tendon 02/28/2014  . Incomplete bladder emptying 02/28/2014  . FOM (frequency of micturition) 02/28/2014  . Nonexudative age-related macular degeneration 02/12/2014  . 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 Speech abnormality 02/13/2013  . Annual physical exam 10/21/2011  . Neck -- back pain- UDS 09/18/2011  . ALLERGIC RHINITIS 12/19/2009  . PELVIC PAIN, CHRONIC 04/03/2009  . Hypogonadism -- per urology 08/11/2007  . OSTEOPOROSIS, IDIOPATHIC 08/11/2007  . HEADACHE 08/11/2007    Past Medical History:  Diagnosis Date  . 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 ALLERGIC RHINITIS 12/19/2009  . Back pain chronic    On hydrocodone-Neurontin  . Blepharitis    B, chronic  . BPH (benign  prostatic hyperplasia)   . Chronic prostatitis     f/u by urology, has a prostate massage prn  . Groin injury   . HYPOGONADISM, MALE 08/11/2007   f/u by Dr Thomasene Mohair urology    . OSTEOPOROSIS, IDIOPATHIC 08/11/2007   f/u at [facility] , now improved to osteopenia  . Raynaud's  disease /phenomenon 08/16/2012  . Rosacea   . Shingles 09-2014  . Varicose veins     Family History  Problem Relation Age of Onset  . COPD Father        Died, 71  . Hypertension Father   . Stroke Father   . CAD Father   . Diabetes Mother   . Hypertension Mother   . CAD Mother           . Colon polyps Mother   . Cancer Mother        Ovarian cancer  . Hypertension Sister   . High Cholesterol Sister   . Colon cancer Maternal Aunt   . Prostate cancer Neg Hx    Past Surgical History:  Procedure Laterality Date  . BLEPHAROPLASTY     B blepharoplasty @ [facility] 08-2011  . CERVICAL DISCECTOMY  1998  . CERVICAL FUSION  1994   C4-C5-C6 fusion  . EYE SURGERY     eyelid surgery on both eyes  . HERNIA REPAIR Left 1979  . IR GENERIC HISTORICAL  03/17/2016   IR EMBO VENOUS NOT HEMORR HEMANG  INC GUIDE ROADMAPPING 03/17/2016 Greggory Keen, MD [facility] ULTRASOUND  . KNEE ARTHROSCOPY    . SHOULDER SURGERY Right    early 2000s  . TRANSURETHRAL RESECTION OF PROSTATE  07-2009   Social History   Social History Narrative   Lives with wife.  They have three children.   Daughter is an Ship broker       Objective: Vital Signs: BP 108/64 (BP Location: Left Arm, Patient Position: Sitting, Cuff Size: Normal)   Pulse 68   Resp 14   Ht 6' (1.829 m)   Wt 192 lb 8 oz (87.3 kg)   BMI 26.11 kg/m    Physical Exam  Constitutional: He is oriented to person, place, and time. He appears well-developed and well-nourished.  HENT:  Head: Normocephalic and atraumatic.  Eyes: Pupils are equal, round, and reactive to light. Conjunctivae and EOM are normal.  Neck: Normal range of motion. Neck supple.  Cardiovascular: Normal rate, regular rhythm and normal heart sounds.  Pulmonary/Chest: Effort normal and breath sounds normal.  Abdominal: Soft. Bowel sounds are normal.  Neurological: He is alert and oriented to person, place, and time.  Skin: Skin is warm and dry. Capillary refill takes 2 to 3 seconds.    Psychiatric: He has a normal mood and affect. His behavior is normal.  Nursing note and vitals reviewed.    Musculoskeletal Exam: He has limited range of motion of his C-spine and lumbar spine.  Shoulder joints are good range of motion without much discomfort.  He has no DIP PIP thickening in his hands or feet.  Hip joints knee joints are good range of motion.  CDAI Exam: No CDAI exam completed.    Investigation: No additional findings. September 06, 2017 p-ANCA 1: 20, lupus anticoagulant negative, beta-2 negative, anticardiolipin negative, hepatitis C negative, hepatitis B-, ENA negative, cryoglobulins negative, CK normal CBC Latest Ref Rng & Units 09/20/2017 03/25/2017 06/06/2015  WBC 4.0 - 10.5 K/uL 3.7(L) 4.8 6.7  Hemoglobin 13.0 - 17.0 g/dL 15.2 16.4 15.5  Hematocrit 39.0 - 52.0 %  43.6 48.0 46.3  Platelets 150.0 - 400.0 K/uL 184.0 208.0 228.0   CMP     Component Value Date/Time   NA 139 09/20/2017 1038   K 3.8 09/20/2017 1038   CL 102 09/20/2017 1038   CO2 29 09/20/2017 1038   GLUCOSE 102 (H) 09/20/2017 1038   BUN 24 (H) 09/20/2017 1038   CREATININE 0.77 09/20/2017 1038   CALCIUM 9.2 09/20/2017 1038   PROT 6.4 09/20/2017 1038   ALBUMIN 4.1 09/20/2017 1038   AST 18 09/20/2017 1038   ALT 19 09/20/2017 1038   ALKPHOS 64 09/20/2017 1038   BILITOT 1.4 (H) 09/20/2017 1038   GFRNONAA 125 04/12/2008 0000   GFRAA 151 04/12/2008 0000    Imaging: Mr Pelvis W Wo Contrast  Result Date: 09/24/2017 CLINICAL DATA:  History of proctitis, questionable perianal abscess on left, Crohn's disease suspected EXAM: MRI PELVIS WITHOUT AND WITH CONTRAST TECHNIQUE: Multiplanar multisequence MR imaging of the pelvis was performed both before and after administration of intravenous contrast. CONTRAST:  51mL MULTIHANCE GADOBENATE DIMEGLUMINE 529 MG/ML IV SOLN COMPARISON:  None. FINDINGS: Urinary Tract:  Within normal limits. Bowel: Visualized bowel is unremarkable. No rectal wall thickening or  inflammatory changes. No evidence of perianal abscess. Vascular/Lymphatic: No evidence of aneurysm. No suspicious pelvic lymphadenopathy. Reproductive: Prostate is notable for postsurgical changes related to prior TURP. Other:  No pelvic ascites. Musculoskeletal: No focal osseous lesions. IMPRESSION: No evidence of perianal abscess. No rectal wall thickening or inflammatory changes on MRI. Postsurgical changes related to prior TURP. Electronically Signed   By: Julian Hy M.D.   On: 09/24/2017 16:29   [place] Limited Joint Space Structures Up Right  Result Date: 10/01/2017 No images saved    Speciality Comments: No specialty comments available.    Procedures:  No procedures performed Allergies: Sulfa drugs cross reactors   Assessment / Plan:     Visit Diagnoses: Raynaud's disease without gangrene - All autoimmune labs are negative, p-ANCA 1: 20 which is nonspecific.  We had detailed discussion regarding his labs.  We discussed use of Norvasc.  He was in agreement.  Prescription for Norvasc 2.5 mg p.o. daily 90-day supply with 1 refill was given.  If he develops any new symptoms he is supposed to notify me.  Degenerative cervical disc - Status post C4-5 and C5-6 fusion.  He has limited range of motion.  Degenerative lumbar disc - Status post L4-5 discectomy 1998.  He has no discomfort currently.  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  Chronic discomfort  Vitamin D deficiency-he had been taking vitamin D supplement.  Age-related osteoporosis without current pathological fracture -  Diagnosed in 1998 and treated with testosterone, vitamin D and bisphosphonates.  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  Meds ordered this encounter  Medications  . amLODipine (NORVASC) 2.5 MG tablet    Sig: Take 1 tablet (2.5 mg total) by mouth daily.    Dispense:  90 tablet    Refill:  1    Face-to-face time spent with patient was 30 minutes. > 50% of time was spent in counseling and coordination of care.  Follow-Up Instructions: Return in about 1 year (around 10/21/2018) for Raynauds phenomenon.  Bo Merino, MD  Note - This record has been created using Editor, commissioning.  Chart creation errors have been sought, but may not always  have been located. Such creation errors do not reflect on  the standard of medical care.

## 2017-10-12 ENCOUNTER — Encounter: Payer: Self-pay | Admitting: Family Medicine

## 2017-10-20 ENCOUNTER — Encounter: Payer: Self-pay | Admitting: Rheumatology

## 2017-10-20 ENCOUNTER — Ambulatory Visit: Payer: 59 | Admitting: Rheumatology

## 2017-10-20 VITALS — BP 108/64 | HR 68 | Resp 14 | Ht 72.0 in | Wt 192.5 lb

## 2017-10-20 DIAGNOSIS — M5030 Other cervical disc degeneration, unspecified cervical region: Secondary | ICD-10-CM

## 2017-10-20 DIAGNOSIS — E559 Vitamin D deficiency, unspecified: Secondary | ICD-10-CM | POA: Diagnosis not present

## 2017-10-20 DIAGNOSIS — M810 Age-related osteoporosis without current pathological fracture: Secondary | ICD-10-CM

## 2017-10-20 DIAGNOSIS — H353190 Nonexudative age-related macular degeneration, unspecified eye, stage unspecified: Secondary | ICD-10-CM

## 2017-10-20 DIAGNOSIS — Z872 Personal history of diseases of the skin and subcutaneous tissue: Secondary | ICD-10-CM

## 2017-10-20 DIAGNOSIS — M5136 Other intervertebral disc degeneration, lumbar region: Secondary | ICD-10-CM

## 2017-10-20 DIAGNOSIS — M7502 Adhesive capsulitis of left shoulder: Secondary | ICD-10-CM | POA: Diagnosis not present

## 2017-10-20 DIAGNOSIS — I7300 Raynaud's syndrome without gangrene: Secondary | ICD-10-CM

## 2017-10-20 DIAGNOSIS — H40023 Open angle with borderline findings, high risk, bilateral: Secondary | ICD-10-CM

## 2017-10-20 DIAGNOSIS — G5731 Lesion of lateral popliteal nerve, right lower limb: Secondary | ICD-10-CM

## 2017-10-20 DIAGNOSIS — M51369 Other intervertebral disc degeneration, lumbar region without mention of lumbar back pain or lower extremity pain: Secondary | ICD-10-CM

## 2017-10-20 MED ORDER — AMLODIPINE BESYLATE 2.5 MG PO TABS: 2.5000 mg | ORAL_TABLET | Freq: Every day | ORAL | 1 refills | Status: DC

## 2017-10-20 MED FILL — AMLODIPINE 2.5 MG TABLET: 2.5 | 90 days supply | Qty: 90 | Fill #0

## 2017-10-25 ENCOUNTER — Ambulatory Visit (AMBULATORY_SURGERY_CENTER): Payer: Self-pay | Admitting: *Deleted

## 2017-10-25 ENCOUNTER — Other Ambulatory Visit: Payer: Self-pay

## 2017-10-25 VITALS — Ht 72.0 in | Wt 193.2 lb

## 2017-10-25 DIAGNOSIS — K6289 Other specified diseases of anus and rectum: Secondary | ICD-10-CM

## 2017-10-25 NOTE — Progress Notes (Signed)
No egg or soy allergy known to patient  No issues with past sedation with any surgeries  or procedures, no intubation problems  No diet pills per patient No home 02 use per patient  No blood thinners per patient  Pt denies issues with constipation  No A fib or A flutter  EMMI video sent to pt's e mail  

## 2017-10-29 ENCOUNTER — Encounter: Payer: Self-pay | Admitting: Gastroenterology

## 2017-11-01 MED FILL — AZELASTINE HCL 137 MCG/SPRA: 137 | 30 days supply | Qty: 30 | Fill #1

## 2017-11-12 ENCOUNTER — Other Ambulatory Visit: Payer: Self-pay

## 2017-11-12 ENCOUNTER — Encounter: Payer: Self-pay | Admitting: Gastroenterology

## 2017-11-12 ENCOUNTER — Ambulatory Visit (AMBULATORY_SURGERY_CENTER): Payer: 59 | Admitting: Gastroenterology

## 2017-11-12 ENCOUNTER — Other Ambulatory Visit: Payer: 59 | Admitting: Gastroenterology

## 2017-11-12 VITALS — BP 99/53 | HR 60 | Temp 98.9°F | Resp 16 | Ht 72.0 in | Wt 193.0 lb

## 2017-11-12 DIAGNOSIS — K648 Other hemorrhoids: Secondary | ICD-10-CM | POA: Diagnosis not present

## 2017-11-12 DIAGNOSIS — K219 Gastro-esophageal reflux disease without esophagitis: Secondary | ICD-10-CM | POA: Diagnosis not present

## 2017-11-12 DIAGNOSIS — K6289 Other specified diseases of anus and rectum: Secondary | ICD-10-CM

## 2017-11-12 MED ORDER — SODIUM CHLORIDE 0.9 % IV SOLN: 500.0000 mL | Freq: Once | INTRAVENOUS | Status: DC

## 2017-11-12 NOTE — Progress Notes (Signed)
Called to room to assist during endoscopic procedure.  Patient ID and intended procedure confirmed with present staff. Received instructions for my participation in the procedure from the performing physician.  

## 2017-11-12 NOTE — Progress Notes (Signed)
Report given to PACU, vss 

## 2017-11-12 NOTE — Patient Instructions (Addendum)

## 2017-11-12 NOTE — Op Note (Signed)
[place] Patient Name: Erik [name] Procedure Date: 11/12/2017 2:09 PM MRN: [id number] Endoscopist: Milus Banister , MD Age: 62 Referring MD:  Date of Birth: 06-09-1956 Gender: Male Account #: [id number] Procedure:                Flexible Sigmoidoscopy Indications:              Rectal pain (fullness, discomfort), pelvic MRI                            normal, known chronic prostatitis Medicines:                Monitored Anesthesia Care Procedure:                Pre-Anesthesia Assessment:                           - Prior to the procedure, a History and Physical                            was performed, and patient medications and                            allergies were reviewed. The patient's tolerance of                            previous anesthesia was also reviewed. The risks                            and benefits of the procedure and the sedation                            options and risks were discussed with the patient.                            All questions were answered, and informed consent                            was obtained. Prior Anticoagulants: The patient has                            taken no previous anticoagulant or antiplatelet                            agents. ASA Grade Assessment: II - A patient with                            mild systemic disease. After reviewing the risks                            and benefits, the patient was deemed in                            satisfactory condition to undergo the procedure.  After obtaining informed consent, the scope was                            passed under direct vision. The Colonoscope was                            introduced through the anus and advanced to the the                            splenic flexure. The flexible sigmoidoscopy was                            accomplished without difficulty. The patient                            tolerated the procedure  well. The quality of the                            bowel preparation was good. Scope In: 2:48:23 PM Scope Out: 2:53:16 PM Total Procedure Duration: 0 hours 4 minutes 53 seconds  Findings:                 The mucosa in the distal rectum was slightly                            erythematous. Biopsies were taken with a cold                            forceps for histology.                           Small external hemorrhoids.                           The exam was otherwise without abnormality. Complications:            No immediate complications. Estimated blood loss:                            None. Estimated Blood Loss:     Estimated blood loss: none. Impression:               - Slightly irregular rectal mucosa, biopsied. This                            may be "prep effect"                           - Small external hemorrhoids.                           - The examination was otherwise normal. Recommendation:           - Patient has a contact number available for                            emergencies. The signs and symptoms  of potential                            delayed complications were discussed with the                            patient. Return to normal activities tomorrow.                            Written discharge instructions were provided to the                            patient.                           - Resume regular diet. Milus Banister, MD 11/12/2017 2:57:19 PM This report has been signed electronically.

## 2017-11-15 ENCOUNTER — Telehealth: Payer: Self-pay | Admitting: *Deleted

## 2017-11-15 NOTE — Telephone Encounter (Signed)
  Follow up Call-  Call back number 11/12/2017 10/28/2015  Post procedure Call Back phone  # [phone]  Permission to leave phone message Yes Yes  Some recent data might be hidden     Patient questions:  Do you have a fever, pain , or abdominal swelling? No. Pain Score  0 *  Have you tolerated food without any problems? Yes.    Have you been able to return to your normal activities? Yes.    Do you have any questions about your discharge instructions: Diet   No. Medications  No. Follow up visit  No.  Do you have questions or concerns about your Care? No.  Actions: * If pain score is 4 or above: No action needed, pain <4.

## 2017-11-18 ENCOUNTER — Encounter: Payer: Self-pay | Admitting: Gastroenterology

## 2017-11-25 ENCOUNTER — Ambulatory Visit (INDEPENDENT_AMBULATORY_CARE_PROVIDER_SITE_OTHER): Payer: 59 | Admitting: Orthopedic Surgery

## 2017-12-08 DIAGNOSIS — L739 Follicular disorder, unspecified: Secondary | ICD-10-CM | POA: Diagnosis not present

## 2017-12-08 DIAGNOSIS — D229 Melanocytic nevi, unspecified: Secondary | ICD-10-CM | POA: Diagnosis not present

## 2017-12-09 ENCOUNTER — Ambulatory Visit (INDEPENDENT_AMBULATORY_CARE_PROVIDER_SITE_OTHER): Payer: 59 | Admitting: Orthopedic Surgery

## 2017-12-09 ENCOUNTER — Encounter (INDEPENDENT_AMBULATORY_CARE_PROVIDER_SITE_OTHER): Payer: Self-pay | Admitting: Orthopedic Surgery

## 2017-12-09 VITALS — Ht 72.0 in | Wt 193.0 lb

## 2017-12-09 DIAGNOSIS — M2022 Hallux rigidus, left foot: Secondary | ICD-10-CM

## 2017-12-09 NOTE — Progress Notes (Signed)
Office Visit Note   Patient: Erik [name]           Date of Birth: 1955/10/10           MRN: [id number] Visit Date: 12/09/2017              Requested by: Colon Branch, [address] [place], [place] [address] PCP: Colon Branch, MD  Chief Complaint  Patient presents with  . Left Foot - Pain  . Right Foot - Pain      HPI: Patient is a 62 year old gentleman with hallux rigidus worse on the left than the right he has some [address] running shoes and custom cork orthotics which have provided him approximately 30% relief.  Assessment & Plan: Visit Diagnoses:  1. Hallux rigidus, left foot     Plan: Continue conservative care discussed that he has persistent symptoms we can consider a cheilectomy of the left great toe MTP joint.  Discussed that this could improve his symptoms about 50 to 75%.  Discussed the risks of surgery including infection.  Patient states he did have an infection when he had a cyst removed from the great toe years ago.  Patient will call or follow-up as needed.  Follow-Up Instructions: Return if symptoms worsen or fail to improve.   Ortho Exam  Patient is alert, oriented, no adenopathy, well-dressed, normal affect, normal respiratory effort. Examination patient has good pulses he has good dorsiflexion of the ankle he only has about 40 degrees dorsiflexion of the left great toe MTP joint.  He is point tender to palpation over the dorsal osteophytic bone spurs on the left.  He has better range of motion of the right with dorsiflexion about 70 degrees.  Imaging: No results found. No images are attached to the encounter.  Labs: Lab Results  Component Value Date   ESRSEDRATE 1 03/25/2017   ESRSEDRATE 8 02/13/2014   ESRSEDRATE 4 08/16/2012   CRP 0.9 03/25/2017   LABURIC 4.0 02/13/2014     Lab Results  Component Value Date   ALBUMIN 4.1 09/20/2017   ALBUMIN 4.0 03/25/2017   ALBUMIN 4.1 06/06/2015   LABURIC 4.0 02/13/2014    Body  mass index is 26.18 kg/m.  Orders:  No orders of the defined types were placed in this encounter.  No orders of the defined types were placed in this encounter.    Procedures: No procedures performed  Clinical Data: No additional findings.  ROS:  All other systems negative, except as noted in the HPI. Review of Systems  Objective: Vital Signs: Ht 6' (1.829 m)   Wt 193 lb (87.5 kg)   BMI 26.18 kg/m   Specialty Comments:  No specialty comments available.  PMFS History: Patient Active Problem List   Diagnosis Date Noted  . Raynaud's disease without gangrene 10/08/2017  . 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 Lumbar radiculopathy 03/25/2017  . Degenerative cervical disc 06/08/2016  . Degenerative lumbar disc 06/08/2016  . PCP NOTES >>> 04/29/2015  . Binocular vision disorder with diplopia 10/01/2014  . 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 Herpes zoster-- 09-2014 09/05/2014  . Anal fissure 02/28/2014  . Chronic prostatitis 02/28/2014  . Abnormal prostate specific antigen 02/28/2014  . Acid reflux 02/28/2014  . Ganglion of tendon 02/28/2014  . Incomplete bladder emptying 02/28/2014  . FOM (frequency of micturition) 02/28/2014  . Nonexudative age-related macular degeneration 02/12/2014  .  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 Speech abnormality 02/13/2013  . Annual physical exam 10/21/2011  . Neck -- back pain- UDS 09/18/2011  . ALLERGIC RHINITIS 12/19/2009  . PELVIC PAIN, CHRONIC 04/03/2009  . Hypogonadism -- per urology 08/11/2007  . OSTEOPOROSIS, IDIOPATHIC 08/11/2007  . HEADACHE 08/11/2007   Past Medical History:  Diagnosis Date  . ALLERGIC RHINITIS 12/19/2009  . Allergy   . Back pain chronic    On hydrocodone-Neurontin  . Blepharitis    B, chronic  . BPH (benign prostatic hyperplasia)   . Chronic prostatitis     f/u by urology, has a prostate massage prn  . GERD (gastroesophageal  reflux disease)   . Groin injury   . HYPOGONADISM, MALE 08/11/2007   f/u by Dr Thomasene Mohair urology    . OSTEOPOROSIS, IDIOPATHIC 08/11/2007   f/u at [facility] , now improved to osteopenia  . Raynaud's disease /phenomenon 08/16/2012  . Rosacea   . Shingles 09-2014  . Varicose veins     Family History  Problem Relation Age of Onset  . COPD Father        Died, 71  . Hypertension Father   . Stroke Father   . CAD Father   . Diabetes Mother   . Hypertension Mother   . CAD Mother           . Colon polyps Mother   . Cancer Mother        Ovarian cancer  . Hypertension Sister   . High Cholesterol Sister   . Colon cancer Maternal Aunt   . Prostate cancer Neg Hx   . Esophageal cancer Neg Hx   . Rectal cancer Neg Hx   . Stomach cancer Neg Hx   . Pancreatic cancer Neg Hx     Past Surgical History:  Procedure Laterality Date  . BLEPHAROPLASTY     B blepharoplasty @ [facility] 08-2011  . CERVICAL DISCECTOMY  1998  . CERVICAL FUSION  1994   C4-C5-C6 fusion  . COLONOSCOPY    . EYE SURGERY     eyelid surgery on both eyes  . HERNIA REPAIR Left 1979  . IR GENERIC HISTORICAL  03/17/2016   IR EMBO VENOUS NOT HEMORR HEMANG  INC GUIDE ROADMAPPING 03/17/2016 Greggory Keen, MD [facility] ULTRASOUND  . KNEE ARTHROSCOPY    . POLYPECTOMY    . SHOULDER SURGERY Right    early 2000s  . SIGMOIDOSCOPY    . TRANSURETHRAL RESECTION OF PROSTATE  07-2009   Social History   Occupational History  . Occupation: [employer]   Tobacco Use  . Smoking status: Never Smoker  . Smokeless tobacco: Never Used  Substance and Sexual Activity  . Alcohol use: No    Alcohol/week: 0.0 oz  . Drug use: No  . Sexual activity: Not on file

## 2017-12-15 MED FILL — GABAPENTIN 300 MG CAPSULE: 300 | 90 days supply | Qty: 90 | Fill #2

## 2017-12-20 DIAGNOSIS — N411 Chronic prostatitis: Secondary | ICD-10-CM | POA: Diagnosis not present

## 2017-12-20 DIAGNOSIS — E291 Testicular hypofunction: Secondary | ICD-10-CM | POA: Diagnosis not present

## 2017-12-29 MED FILL — TESTOSTERONE CYP 200 MG/ML: 200 | 84 days supply | Qty: 6 | Fill #1

## 2018-02-18 DIAGNOSIS — N419 Inflammatory disease of prostate, unspecified: Secondary | ICD-10-CM | POA: Diagnosis not present

## 2018-02-21 DIAGNOSIS — H353121 Nonexudative age-related macular degeneration, left eye, early dry stage: Secondary | ICD-10-CM | POA: Diagnosis not present

## 2018-02-21 DIAGNOSIS — H40013 Open angle with borderline findings, low risk, bilateral: Secondary | ICD-10-CM | POA: Diagnosis not present

## 2018-02-21 DIAGNOSIS — H04123 Dry eye syndrome of bilateral lacrimal glands: Secondary | ICD-10-CM | POA: Diagnosis not present

## 2018-02-21 DIAGNOSIS — H353112 Nonexudative age-related macular degeneration, right eye, intermediate dry stage: Secondary | ICD-10-CM | POA: Diagnosis not present

## 2018-02-24 DIAGNOSIS — J309 Allergic rhinitis, unspecified: Secondary | ICD-10-CM | POA: Diagnosis not present

## 2018-02-24 DIAGNOSIS — E291 Testicular hypofunction: Secondary | ICD-10-CM | POA: Diagnosis not present

## 2018-02-24 DIAGNOSIS — I7300 Raynaud's syndrome without gangrene: Secondary | ICD-10-CM | POA: Diagnosis not present

## 2018-02-24 DIAGNOSIS — Z86010 Personal history of colonic polyps: Secondary | ICD-10-CM | POA: Diagnosis not present

## 2018-02-25 DIAGNOSIS — Z87438 Personal history of other diseases of male genital organs: Secondary | ICD-10-CM | POA: Diagnosis not present

## 2018-02-25 DIAGNOSIS — N400 Enlarged prostate without lower urinary tract symptoms: Secondary | ICD-10-CM | POA: Diagnosis not present

## 2018-02-25 DIAGNOSIS — N529 Male erectile dysfunction, unspecified: Secondary | ICD-10-CM | POA: Diagnosis not present

## 2018-02-25 DIAGNOSIS — N411 Chronic prostatitis: Secondary | ICD-10-CM | POA: Diagnosis not present

## 2018-02-25 DIAGNOSIS — Z79899 Other long term (current) drug therapy: Secondary | ICD-10-CM | POA: Diagnosis not present

## 2018-02-25 DIAGNOSIS — N419 Inflammatory disease of prostate, unspecified: Secondary | ICD-10-CM | POA: Diagnosis not present

## 2018-03-21 ENCOUNTER — Other Ambulatory Visit (HOSPITAL_COMMUNITY): Payer: Self-pay | Admitting: Interventional Radiology

## 2018-03-21 DIAGNOSIS — M79604 Pain in right leg: Secondary | ICD-10-CM

## 2018-03-21 DIAGNOSIS — M79605 Pain in left leg: Principal | ICD-10-CM

## 2018-03-21 MED FILL — TESTOSTERONE CYP 200 MG/ML: 200 | 84 days supply | Qty: 6 | Fill #2

## 2018-03-21 MED FILL — GABAPENTIN 300 MG CAPSULE: 300 | 90 days supply | Qty: 90 | Fill #3

## 2018-03-23 ENCOUNTER — Ambulatory Visit: Payer: 59 | Admitting: Internal Medicine

## 2018-03-23 ENCOUNTER — Encounter: Payer: Self-pay | Admitting: Internal Medicine

## 2018-03-23 ENCOUNTER — Ambulatory Visit (HOSPITAL_BASED_OUTPATIENT_CLINIC_OR_DEPARTMENT_OTHER)
Admission: RE | Admit: 2018-03-23 | Discharge: 2018-03-23 | Disposition: A | Discharge status: Home / Self Care | Payer: 59 | Source: Ambulatory Visit | Attending: Internal Medicine | Admitting: Internal Medicine

## 2018-03-23 VITALS — BP 102/68 | HR 61 | Temp 98.0°F | Resp 16 | Ht 72.0 in | Wt 196.4 lb

## 2018-03-23 DIAGNOSIS — X58XXXA Exposure to other specified factors, initial encounter: Secondary | ICD-10-CM | POA: Diagnosis not present

## 2018-03-23 DIAGNOSIS — M7732 Calcaneal spur, left foot: Secondary | ICD-10-CM | POA: Diagnosis not present

## 2018-03-23 DIAGNOSIS — S9782XA Crushing injury of left foot, initial encounter: Secondary | ICD-10-CM

## 2018-03-23 NOTE — Patient Instructions (Signed)
  STOP BY THE FIRST FLOOR:  get the XR     

## 2018-03-23 NOTE — Progress Notes (Signed)
Subjective:    Patient ID: Erik [name], male    DOB: [birth year], 62 y.o.   MRN: [id number]  DOS:  03/23/2018 Type of visit - description : acute Interval history: About 5 weeks ago, he was walking barefooted in a dark room, he injured the LEFT  fourth and fifth toes. There was no bleeding but swelling and ecchymosis. Although pain is somewhat better he is still experiencing it, the pain radiates proximally close to the ankle.   Review of Systems  Denies other injuries.  Past Medical History:  Diagnosis Date  . ALLERGIC RHINITIS 12/19/2009  . Allergy   . Back pain chronic    On hydrocodone-Neurontin  . Blepharitis    B, chronic  . BPH (benign prostatic hyperplasia)   . Chronic prostatitis     f/u by urology, has a prostate massage prn  . GERD (gastroesophageal reflux disease)   . Groin injury   . HYPOGONADISM, MALE 08/11/2007   f/u by Dr Thomasene Mohair urology    . OSTEOPOROSIS, IDIOPATHIC 08/11/2007   f/u at [facility] , now improved to osteopenia  . Raynaud's disease /phenomenon 08/16/2012  . Rosacea   . Shingles 09-2014  . Varicose veins     Past Surgical History:  Procedure Laterality Date  . BLEPHAROPLASTY     B blepharoplasty @ [facility] 08-2011  . CERVICAL DISCECTOMY  1998  . CERVICAL FUSION  1994   C4-C5-C6 fusion  . COLONOSCOPY    . EYE SURGERY     eyelid surgery on both eyes  . HERNIA REPAIR Left 1979  . IR GENERIC HISTORICAL  03/17/2016   IR EMBO VENOUS NOT HEMORR HEMANG  INC GUIDE ROADMAPPING 03/17/2016 Greggory Keen, MD [facility] ULTRASOUND  . KNEE ARTHROSCOPY    . POLYPECTOMY    . SHOULDER SURGERY Right    early 2000s  . SIGMOIDOSCOPY    . TRANSURETHRAL RESECTION OF PROSTATE  07-2009    Social History   Socioeconomic History  . Marital status: Married    Spouse name: Not on file  . Number of children: 3  . Years of education: Not on file  . Highest education level: Not on file  Occupational History  . Occupation: [employer]   Social Needs  . Financial  resource strain: Not on file  . Food insecurity:    Worry: Not on file    Inability: Not on file  . Transportation needs:    Medical: Not on file    Non-medical: Not on file  Tobacco Use  . Smoking status: Never Smoker  . Smokeless tobacco: Never Used  Substance and Sexual Activity  . Alcohol use: No    Alcohol/week: 0.0 standard drinks  . Drug use: No  . Sexual activity: Not on file  Lifestyle  . Physical activity:    Days per week: Not on file    Minutes per session: Not on file  . Stress: Not on file  Relationships  . Social connections:    Talks on phone: Not on file    Gets together: Not on file    Attends religious service: Not on file    Active member of club or organization: Not on file    Attends meetings of clubs or organizations: Not on file    Relationship status: Not on file  . Intimate partner violence:    Fear of current or ex partner: Not on file    Emotionally abused: Not on file    Physically abused: Not on  file    Forced sexual activity: Not on file  Other Topics Concern  . Not on file  Social History Narrative   Lives with wife.  They have three children.   Daughter is an Ship broker        Allergies as of 03/23/2018      Reactions   Sulfa Drugs Cross Reactors Nausea And Vomiting      Medication List        Accurate as of 03/23/18 10:02 AM. Always use your most recent med list.          amLODipine 2.5 MG tablet Commonly known as:  NORVASC Take 1 tablet (2.5 mg total) by mouth daily.   azelastine 0.1 % nasal spray Commonly known as:  ASTELIN Place 2 sprays into both nostrils at bedtime as needed for rhinitis. Use in each nostril as directed   Fish Oil 1000 MG Caps Take 1 capsule by mouth 2 (two) times daily.   gabapentin 300 MG capsule Commonly known as:  NEURONTIN Take 1 capsule (300 mg total) by mouth at bedtime.   lidocaine 5 % ointment Commonly known as:  XYLOCAINE Apply 1 application topically as needed.   loratadine 10 MG  tablet Commonly known as:  CLARITIN Take 10 mg by mouth daily.   MULTI-VITAMINS Tabs Take by mouth.   omeprazole 40 MG capsule Commonly known as:  PRILOSEC Take 1 capsule (40 mg total) by mouth daily.   OVER THE COUNTER MEDICATION Take 1 tablet by mouth 2 (two) times daily. AREDS 2   testosterone cypionate 100 MG/ML injection Commonly known as:  DEPOTESTOTERONE CYPIONATE Inject into the muscle every 14 (fourteen) days. For IM use only   traMADol 50 MG tablet Commonly known as:  ULTRAM TAKE 1 TABLET BY MOUTH EVERY 8 HOURS AS NEEDED          Objective:   Physical Exam BP 102/68 (BP Location: Right Arm, Patient Position: Sitting, Cuff Size: Normal)   Pulse 61   Temp 98 F (36.7 C) (Oral)   Resp 16   Ht 6' (1.829 m)   Wt 196 lb 6 oz (89.1 kg)   SpO2 96%   BMI 26.63 kg/m  General:   Well developed, NAD, see BMI.  HEENT:  Normocephalic . Face symmetric, atraumatic MSK Left foot: No obvious deformities, no swelling, no ecchymosis, good pulses. The fourth toe is easily mobilized without pain. Fifth toe elicited pain with passive movement. Skin: Not pale. Not jaundice Neurologic:  alert & oriented X3.  Speech normal, gait appropriate for age and unassisted Psych--  Cognition and judgment appear intact.  Cooperative with normal attention span and concentration.  Behavior appropriate. No anxious or depressed appearing.      Assessment & Plan:   Assessment   Osteoporosis, idiopathic, follow-up at [facility] Raynaud phenomena 2013--Dr Truslow, amlodipine prn MSK -- s/p Cervical fusion --Chronic back pain: used to be on Hydrocodone rx by pcp;  on gabapentin ) -- L shoulder adhesive capsulitis -- stress fracture L sesamoid (foot) dx 2018) GU: Dr. Thomasene Mohair, urology --BPH, TURP 2011 --Chronic prostatitis --Hypogonadism, per urology Rosacea Varicose veins treatment @ [facility] [facility] OP[facility]: Glaucoma -Chronic blepharitis- ocular rosacea- Mac degeneration H/o Shingles 09-2014     PLAN Left foot injury: Injury happened 5 weeks ago, still hurting but not as much.  Currently taking Tylenol and Motrin OTC (GI precautions discussed). We will get a x-ray taking into account that he had the fifth toe operated before (per pt). Continue conservative  treatments, further advised with results.

## 2018-03-23 NOTE — Assessment & Plan Note (Signed)
Left foot injury: Injury happened 5 weeks ago, still hurting but not as much.  Currently taking Tylenol and Motrin OTC (GI precautions discussed). We will get a x-ray taking into account that he had the fifth toe operated before (per pt). Continue conservative treatments, further advised with results.

## 2018-03-23 NOTE — Progress Notes (Signed)
Pre visit review using our clinic review tool, if applicable. No additional management support is needed unless otherwise documented below in the visit note. 

## 2018-03-31 DIAGNOSIS — H00024 Hordeolum internum left upper eyelid: Secondary | ICD-10-CM | POA: Diagnosis not present

## 2018-03-31 DIAGNOSIS — H00014 Hordeolum externum left upper eyelid: Secondary | ICD-10-CM | POA: Diagnosis not present

## 2018-03-31 MED FILL — DOXYCYCLINE HYCLATE 100 MG: 100 | 10 days supply | Qty: 20 | Fill #0

## 2018-04-05 DIAGNOSIS — G43819 Other migraine, intractable, without status migrainosus: Secondary | ICD-10-CM | POA: Diagnosis not present

## 2018-04-05 DIAGNOSIS — H04123 Dry eye syndrome of bilateral lacrimal glands: Secondary | ICD-10-CM | POA: Diagnosis not present

## 2018-04-05 DIAGNOSIS — H00024 Hordeolum internum left upper eyelid: Secondary | ICD-10-CM | POA: Diagnosis not present

## 2018-04-05 DIAGNOSIS — H00014 Hordeolum externum left upper eyelid: Secondary | ICD-10-CM | POA: Diagnosis not present

## 2018-04-15 ENCOUNTER — Ambulatory Visit (INDEPENDENT_AMBULATORY_CARE_PROVIDER_SITE_OTHER): Payer: 59

## 2018-04-15 DIAGNOSIS — Z23 Encounter for immunization: Secondary | ICD-10-CM | POA: Diagnosis not present

## 2018-04-19 ENCOUNTER — Ambulatory Visit
Admission: RE | Admit: 2018-04-19 | Discharge: 2018-04-19 | Disposition: A | Discharge status: Home / Self Care | Payer: 59 | Source: Ambulatory Visit | Attending: Interventional Radiology | Admitting: Interventional Radiology

## 2018-04-19 DIAGNOSIS — I8391 Asymptomatic varicose veins of right lower extremity: Secondary | ICD-10-CM | POA: Diagnosis not present

## 2018-04-19 DIAGNOSIS — I83893 Varicose veins of bilateral lower extremities with other complications: Secondary | ICD-10-CM | POA: Diagnosis not present

## 2018-04-19 DIAGNOSIS — M79605 Pain in left leg: Principal | ICD-10-CM

## 2018-04-19 DIAGNOSIS — M79604 Pain in right leg: Secondary | ICD-10-CM

## 2018-04-19 DIAGNOSIS — I739 Peripheral vascular disease, unspecified: Secondary | ICD-10-CM | POA: Diagnosis not present

## 2018-04-19 NOTE — Progress Notes (Signed)
Patient ID: Erik [name], male   DOB: 1956-01-30, 62 y.o.   MRN: [id number]       Chief Complaint:  Bilateral varicose veins, progressive bilateral calf pain and enlarging peripheral varicosities.  History of bilateral venous insufficiency and laser ablations.  Referring Physician(s): Shaquayla Klimas  History of Present Illness: Erik [name] is a 62 y.o. male with a prior history of bilateral GSV transcatheter laser occlusions and bilateral calf varicosities.  He was treated several years ago and has not been seen since 2016.  He presents for reevaluation of recurrent right greater than left calf varicosities with associated peripheral edema and progressive leg pain despite conservative management.  He has been compliant with compression stockings, daily walking, and weight management.  No interval active bleeding.  No ulcerations.  No fevers.  Past Medical History:  Diagnosis Date  . ALLERGIC RHINITIS 12/19/2009  . Allergy   . Back pain chronic    On hydrocodone-Neurontin  . Blepharitis    B, chronic  . BPH (benign prostatic hyperplasia)   . Chronic prostatitis     f/u by urology, has a prostate massage prn  . GERD (gastroesophageal reflux disease)   . Groin injury   . HYPOGONADISM, MALE 08/11/2007   f/u by Dr Thomasene Mohair urology    . OSTEOPOROSIS, IDIOPATHIC 08/11/2007   f/u at [facility] , now improved to osteopenia  . Raynaud's disease /phenomenon 08/16/2012  . Rosacea   . Shingles 09-2014  . Varicose veins     Past Surgical History:  Procedure Laterality Date  . BLEPHAROPLASTY     B blepharoplasty @ [facility] 08-2011  . CERVICAL DISCECTOMY  1998  . CERVICAL FUSION  1994   C4-C5-C6 fusion  . COLONOSCOPY    . EYE SURGERY     eyelid surgery on both eyes  . HERNIA REPAIR Left 1979  . IR GENERIC HISTORICAL  03/17/2016   IR EMBO VENOUS NOT HEMORR HEMANG  INC GUIDE ROADMAPPING 03/17/2016 Greggory Keen, MD [facility] ULTRASOUND  . KNEE ARTHROSCOPY    . POLYPECTOMY    . SHOULDER SURGERY  Right    early 2000s  . SIGMOIDOSCOPY    . TRANSURETHRAL RESECTION OF PROSTATE  07-2009    Allergies: Sulfa drugs cross reactors  Medications: Prior to Admission medications   Medication Sig Start Date End Date Taking? Authorizing Provider  amLODipine (NORVASC) 2.5 MG tablet Take 1 tablet (2.5 mg total) by mouth daily. 10/20/17   Bo Merino, MD  azelastine (ASTELIN) 0.1 % nasal spray Place 2 sprays into both nostrils at bedtime as needed for rhinitis. Use in each nostril as directed 06/09/17   Colon Branch, MD  gabapentin (NEURONTIN) 300 MG capsule Take 1 capsule (300 mg total) by mouth at bedtime. 07/02/17   Lyndal Pulley, DO  lidocaine (XYLOCAINE) 5 % ointment Apply 1 application topically as needed. 09/20/17   Levin Erp, PA  Multiple Vitamin (MULTI-VITAMINS) TABS Take by mouth.    [provider]  Omega-3 Fatty Acids (FISH OIL) 1000 MG CAPS Take 1 capsule by mouth 2 (two) times daily.    [provider]  OVER THE COUNTER MEDICATION Take 1 tablet by mouth 2 (two) times daily. AREDS 2    [provider]  testosterone cypionate (DEPOTESTOTERONE CYPIONATE) 100 MG/ML injection Inject into the muscle every 14 (fourteen) days. For IM use only    [provider]  traMADol (ULTRAM) 50 MG tablet TAKE 1 TABLET BY MOUTH EVERY 8 HOURS AS NEEDED Patient  not taking: Reported on 03/23/2018 05/03/17   Lyndal Pulley, DO     Family History  Problem Relation Age of Onset  . COPD Father        Died, 71  . Hypertension Father   . Stroke Father   . CAD Father   . Diabetes Mother   . Hypertension Mother   . CAD Mother           . Colon polyps Mother   . Cancer Mother        Ovarian cancer  . Hypertension Sister   . High Cholesterol Sister   . Colon cancer Maternal Aunt   . Prostate cancer Neg Hx   . Esophageal cancer Neg Hx   . Rectal cancer Neg Hx   . Stomach cancer Neg Hx   . Pancreatic cancer Neg Hx     Social History    Socioeconomic History  . Marital status: Married    Spouse name: Not on file  . Number of children: 3  . Years of education: Not on file  . Highest education level: Not on file  Occupational History  . Occupation: [employer]   Social Needs  . Financial resource strain: Not on file  . Food insecurity:    Worry: Not on file    Inability: Not on file  . Transportation needs:    Medical: Not on file    Non-medical: Not on file  Tobacco Use  . Smoking status: Never Smoker  . Smokeless tobacco: Never Used  Substance and Sexual Activity  . Alcohol use: No    Alcohol/week: 0.0 standard drinks  . Drug use: No  . Sexual activity: Not on file  Lifestyle  . Physical activity:    Days per week: Not on file    Minutes per session: Not on file  . Stress: Not on file  Relationships  . Social connections:    Talks on phone: Not on file    Gets together: Not on file    Attends religious service: Not on file    Active member of club or organization: Not on file    Attends meetings of clubs or organizations: Not on file    Relationship status: Not on file  Other Topics Concern  . Not on file  Social History Narrative   Lives with wife.  They have three children.   Daughter is an Ship broker      Review of Systems: A 12 point ROS discussed and pertinent positives are indicated in the HPI above.  All other systems are negative.  Review of Systems  Vital Signs: BP (!) 102/59   Pulse 65   Temp 97.8 F (36.6 C) (Oral)   Resp 15   Ht 6' (1.829 m)   Wt 86.2 kg   SpO2 99%   BMI 25.77 kg/m   Physical Exam  Constitutional: He appears well-developed and well-nourished. No distress.  Musculoskeletal: He exhibits edema and tenderness.  Right greater than left superficial calf varicosities noted.  Peripheral calf and ankle +2 edema evident, worse on the right as well.  No ulcerations.  Skin intact.  Scattered spider veins.  No evidence of active or acute thrombophlebitis.  Skin: He  is not diaphoretic.    Imaging: US Venous Img Lower Bilateral  Result Date: 04/19/2018 CLINICAL DATA:  Recurrent right greater than left calf varicosities. History of varicose vein venous insufficiency and bilateral lower extremity transcatheter laser ablation as well as injection sclerotherapy EXAM: BILATERAL  LOWER EXTREMITY VENOUS DOPPLER ULTRASOUND TECHNIQUE: Gray-scale sonography with graded compression, as well as color Doppler and duplex ultrasound were performed to evaluate the lower extremity deep venous systems from the level of the common femoral vein and including the common femoral, femoral, profunda femoral, popliteal and calf veins including the posterior tibial, peroneal and gastrocnemius veins when visible. The superficial great saphenous vein was also interrogated. Spectral Doppler was utilized to evaluate flow at rest and with distal augmentation maneuvers in the common femoral, femoral and popliteal veins. COMPARISON:  None. FINDINGS: RIGHT LOWER EXTREMITY Common Femoral Vein: No evidence of thrombus. Normal compressibility, respiratory phasicity and response to augmentation. Saphenofemoral Junction: No evidence of thrombus. Normal compressibility and flow on color Doppler imaging. Profunda Femoral Vein: No evidence of thrombus. Normal compressibility and flow on color Doppler imaging. Femoral Vein: No evidence of thrombus. Normal compressibility, respiratory phasicity and response to augmentation. Popliteal Vein: No evidence of thrombus. Normal compressibility, respiratory phasicity and response to augmentation. Calf Veins: No evidence of thrombus. Normal compressibility and flow on color Doppler imaging. Superficial Great Saphenous Vein: Previous main right GSV is occluded from prior treatment. There is an anterolateral right saphenous branch which is patent in the proximal thigh but does not begin to have venous insufficiency/reflux to the distal thigh level and across the knee. Reflux  in the anterolateral saphenous vein extends into the calf region where there branching varicosities. No significant aneurysmal segment of tortuosity. The peripheral right anterolateral saphenous branch is amenable to transcatheter laser occlusion. Right small saphenous vein: Mildly dilated. 8 mm diameter. Mild diffuse for venous insufficiency/reflux noted with branching varicosities extending to the ankle. Right small saphenous vein is also amenable to transcatheter laser treatment. Venous Reflux:  Positive as above Other Findings:  None. LEFT LOWER EXTREMITY Common Femoral Vein: No evidence of thrombus. Normal compressibility, respiratory phasicity and response to augmentation. Saphenofemoral Junction: No evidence of thrombus. Normal compressibility and flow on color Doppler imaging. Profunda Femoral Vein: No evidence of thrombus. Normal compressibility and flow on color Doppler imaging. Femoral Vein: No evidence of thrombus. Normal compressibility, respiratory phasicity and response to augmentation. Popliteal Vein: No evidence of thrombus. Normal compressibility, respiratory phasicity and response to augmentation. Calf Veins: No evidence of thrombus. Normal compressibility and flow on color Doppler imaging. Superficial Great Saphenous Vein: Left GSV treated segment is occluded from prior treatment. No significant recanalization in the thigh region. However, below the knee there are portions of the left distal GSV which have recanalized and have branching varicosities as well as incompetent perforators contributing flow to the varicosities. Left small saphenous vein: Minimally dilated with branching varicosities. No significant reflux. Venous Reflux:  As above Other Findings:  None. IMPRESSION: Negative for DVT in either extremity. Segments of the right anterolateral saphenous and the right small saphenous vein as above are amenable to transcatheter laser occlusion. These areas demonstrate venous  insufficiency/reflux and contribute flow to enlarging branching symptomatic varicosities in the right calf and ankle region. Minor left distal GSV and SSV venous insufficiency in the calf region with early developing branching varicosities and adjacent incompetent perforators. Electronically Signed   By: Jerilynn Mages.  Marlin Brys M.D.   On: 04/19/2018 13:02   [place] [facility]  Result Date: 04/19/2018 Please refer to "Notes" to see consult details.  Dg Foot Complete Left  Result Date: 03/23/2018 CLINICAL DATA:  Left lateral foot pain. EXAM: LEFT FOOT - COMPLETE 3+ VIEW COMPARISON:  None. FINDINGS: There is no evidence of fracture or dislocation. There  is minimal osteoarthritis of the first MTP joint. There is a tiny plantar calcaneal spur. Soft tissues are unremarkable. IMPRESSION: No acute osseous injury of the left foot. Electronically Signed   By: Kathreen Devoid   On: 03/23/2018 15:32    Labs:  CBC: Recent Labs    09/20/17 1038  WBC 3.7*  HGB 15.2  HCT 43.6  PLT 184.0    COAGS: No results for input(s): INR, APTT in the last 8760 hours.  BMP: Recent Labs    09/20/17 1038  NA 139  K 3.8  CL 102  CO2 29  GLUCOSE 102*  BUN 24*  CALCIUM 9.2  CREATININE 0.77    LIVER FUNCTION TESTS: Recent Labs    09/20/17 1038  BILITOT 1.4*  AST 18  ALT 19  ALKPHOS 64  PROT 6.4  ALBUMIN 4.1    TUMOR MARKERS: No results for input(s): AFPTM, CEA, CA199, CHROMGRNA in the last 8760 hours.  Assessment and Plan:  Repeat ultrasound today confirms evidence of venous insufficiency/reflux in the right anterolateral saphenous vein and the right small saphenous vein resulting in the symptomatic branching right calf varicosities.  These pathways are both amenable to transcatheter laser occlusion.  The procedure, risk, benefits and alternatives were reviewed.  The expected outcomes, goals and recovery also reviewed.  Patient has a clear understanding because he has had the procedure on both legs before.   He also may benefit from injection sclerotherapy of residual calf varicosities following the treatment.  Currently his right leg is worse.  Plan: Submit information for insurance evaluation for right anterolateral saphenous and right small saphenous transcatheter laser occlusion and right calf varicosity sclerotherapy.  Thank you for this interesting consult.  I greatly enjoyed meeting ASHLEIGH ARYA and look forward to participating in their care.  A copy of this report was sent to the requesting provider on this date.  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

## 2018-04-28 ENCOUNTER — Other Ambulatory Visit (HOSPITAL_COMMUNITY): Payer: Self-pay | Admitting: Interventional Radiology

## 2018-04-28 MED FILL — CLOBETASOL PROP 0.05% FOAM: 0.05 | 30 days supply | Qty: 100 | Fill #0

## 2018-05-02 DIAGNOSIS — N411 Chronic prostatitis: Secondary | ICD-10-CM | POA: Diagnosis not present

## 2018-05-06 DIAGNOSIS — H00024 Hordeolum internum left upper eyelid: Secondary | ICD-10-CM | POA: Diagnosis not present

## 2018-05-06 DIAGNOSIS — H01001 Unspecified blepharitis right upper eyelid: Secondary | ICD-10-CM | POA: Diagnosis not present

## 2018-05-06 DIAGNOSIS — H01002 Unspecified blepharitis right lower eyelid: Secondary | ICD-10-CM | POA: Diagnosis not present

## 2018-05-06 DIAGNOSIS — H04123 Dry eye syndrome of bilateral lacrimal glands: Secondary | ICD-10-CM | POA: Diagnosis not present

## 2018-05-11 DIAGNOSIS — N411 Chronic prostatitis: Secondary | ICD-10-CM | POA: Diagnosis not present

## 2018-05-11 DIAGNOSIS — T50905A Adverse effect of unspecified drugs, medicaments and biological substances, initial encounter: Secondary | ICD-10-CM | POA: Diagnosis not present

## 2018-05-17 DIAGNOSIS — N411 Chronic prostatitis: Secondary | ICD-10-CM | POA: Diagnosis not present

## 2018-05-17 DIAGNOSIS — E291 Testicular hypofunction: Secondary | ICD-10-CM | POA: Diagnosis not present

## 2018-05-23 DIAGNOSIS — H353131 Nonexudative age-related macular degeneration, bilateral, early dry stage: Secondary | ICD-10-CM | POA: Diagnosis not present

## 2018-05-23 DIAGNOSIS — H40003 Preglaucoma, unspecified, bilateral: Secondary | ICD-10-CM | POA: Diagnosis not present

## 2018-05-23 DIAGNOSIS — H40023 Open angle with borderline findings, high risk, bilateral: Secondary | ICD-10-CM | POA: Diagnosis not present

## 2018-05-23 DIAGNOSIS — H02889 Meibomian gland dysfunction of unspecified eye, unspecified eyelid: Secondary | ICD-10-CM | POA: Diagnosis not present

## 2018-05-24 ENCOUNTER — Other Ambulatory Visit (HOSPITAL_COMMUNITY): Payer: Self-pay | Admitting: Interventional Radiology

## 2018-05-24 DIAGNOSIS — I8311 Varicose veins of right lower extremity with inflammation: Secondary | ICD-10-CM

## 2018-06-06 ENCOUNTER — Other Ambulatory Visit: Payer: Self-pay | Admitting: Family Medicine

## 2018-06-06 MED FILL — GABAPENTIN 300 MG CAPSULE: 300 | 90 days supply | Qty: 90 | Fill #0

## 2018-06-06 NOTE — Telephone Encounter (Signed)
Refill done.  

## 2018-06-08 MED FILL — TESTOSTERONE CYP 200 MG/ML: 200 | 84 days supply | Qty: 6 | Fill #0

## 2018-06-10 ENCOUNTER — Ambulatory Visit (INDEPENDENT_AMBULATORY_CARE_PROVIDER_SITE_OTHER): Payer: 59 | Admitting: Internal Medicine

## 2018-06-10 ENCOUNTER — Encounter: Payer: Self-pay | Admitting: Internal Medicine

## 2018-06-10 VITALS — BP 124/68 | HR 66 | Temp 98.1°F | Resp 16 | Ht 72.0 in | Wt 187.4 lb

## 2018-06-10 DIAGNOSIS — Z0000 Encounter for general adult medical examination without abnormal findings: Secondary | ICD-10-CM | POA: Diagnosis not present

## 2018-06-10 LAB — COMPREHENSIVE METABOLIC PANEL
ALK PHOS: 60 U/L (ref 39–117)
ALT: 20 U/L (ref 0–53)
AST: 18 U/L (ref 0–37)
Albumin: 4.4 g/dL (ref 3.5–5.2)
BILIRUBIN TOTAL: 1.3 mg/dL — AB (ref 0.2–1.2)
BUN: 25 mg/dL — ABNORMAL HIGH (ref 6–23)
CO2: 31 meq/L (ref 19–32)
CREATININE: 0.84 mg/dL (ref 0.40–1.50)
Calcium: 9.3 mg/dL (ref 8.4–10.5)
Chloride: 103 mEq/L (ref 96–112)
GFR: 97.72 mL/min (ref >60.00)
GLUCOSE: 99 mg/dL (ref 70–99)
Potassium: 4.5 mEq/L (ref 3.5–5.1)
Sodium: 140 mEq/L (ref 135–145)
TOTAL PROTEIN: 6.2 g/dL (ref 6.0–8.3)

## 2018-06-10 LAB — LIPID PANEL
CHOL/HDL RATIO: 4
Cholesterol: 211 mg/dL — ABNORMAL HIGH (ref 0–200)
HDL: 47.6 mg/dL (ref >39.00)
LDL Cholesterol: 146 mg/dL — ABNORMAL HIGH (ref 0–99)
NONHDL: 163.62
Triglycerides: 87 mg/dL (ref 0.0–149.0)
VLDL: 17.4 mg/dL (ref 0.0–40.0)

## 2018-06-10 LAB — HEMOGLOBIN A1C: Hgb A1c MFr Bld: 5.6 % (ref 4.6–6.5)

## 2018-06-10 MED ORDER — CROMOLYN SODIUM 5.2 MG/ACT NA AERS: 1.0000 | INHALATION_SPRAY | Freq: Four times a day (QID) | NASAL | 12 refills | Status: DC

## 2018-06-10 MED FILL — CROMOLYN SODIUM NASAL SPRAY: 5.2 | 30 days supply | Qty: 26 | Fill #0

## 2018-06-10 NOTE — Progress Notes (Signed)
Subjective:    Patient ID: Erik [name], male    DOB: 12-25-1955, 62 y.o.   MRN: [id number]  DOS:  06/10/2018 Type of visit - description : cpx In general feeling well, he does have some concerns  Wt Readings from Last 3 Encounters:  06/10/18 187 lb 6 oz (85 kg)  04/19/18 190 lb (86.2 kg)  03/23/18 196 lb 6 oz (89.1 kg)     Review of Systems Pain at the base of the left thumb, mostly when he uses the finger. He has year-round postnasal dripping, sometimes severe and constant, taking Claritin and Astelin. Sometimes it is worse when he eats. Foot pain is still an issue, is better but he is not running as he used to.  Other than above, a 14 point review of systems is negative    Past Medical History:  Diagnosis Date  . ALLERGIC RHINITIS 12/19/2009  . Allergy   . Back pain chronic    On hydrocodone-Neurontin  . Blepharitis    B, chronic  . BPH (benign prostatic hyperplasia)   . Chronic prostatitis     f/u by urology, has a prostate massage prn  . GERD (gastroesophageal reflux disease)   . Groin injury   . HYPOGONADISM, MALE 08/11/2007   f/u by Dr Thomasene Mohair urology    . OSTEOPOROSIS, IDIOPATHIC 08/11/2007   f/u at [facility] , now improved to osteopenia  . Raynaud's disease /phenomenon 08/16/2012  . Rosacea   . Shingles 09-2014  . Varicose veins     Past Surgical History:  Procedure Laterality Date  . BLEPHAROPLASTY     B blepharoplasty @ [facility] 08-2011  . CERVICAL DISCECTOMY  1998  . CERVICAL FUSION  1994   C4-C5-C6 fusion  . COLONOSCOPY    . EYE SURGERY     eyelid surgery on both eyes  . HERNIA REPAIR Left 1979  . IR GENERIC HISTORICAL  03/17/2016   IR EMBO VENOUS NOT HEMORR HEMANG  INC GUIDE ROADMAPPING 03/17/2016 Greggory Keen, MD [facility] ULTRASOUND  . KNEE ARTHROSCOPY    . POLYPECTOMY    . SHOULDER SURGERY Right    early 2000s  . SIGMOIDOSCOPY    . TRANSURETHRAL RESECTION OF PROSTATE  07-2009    Social History   Socioeconomic History  . Marital status: Married     Spouse name: Not on file  . Number of children: 3  . Years of education: Not on file  . Highest education level: Not on file  Occupational History  . Occupation: [employer]   Social Needs  . Financial resource strain: Not on file  . Food insecurity:    Worry: Not on file    Inability: Not on file  . Transportation needs:    Medical: Not on file    Non-medical: Not on file  Tobacco Use  . Smoking status: Never Smoker  . Smokeless tobacco: Never Used  Substance and Sexual Activity  . Alcohol use: No    Alcohol/week: 0.0 standard drinks  . Drug use: No  . Sexual activity: Not on file  Lifestyle  . Physical activity:    Days per week: Not on file    Minutes per session: Not on file  . Stress: Not on file  Relationships  . Social connections:    Talks on phone: Not on file    Gets together: Not on file    Attends religious service: Not on file    Active member of club or organization: Not on file  Attends meetings of clubs or organizations: Not on file    Relationship status: Not on file  . Intimate partner violence:    Fear of current or ex partner: Not on file    Emotionally abused: Not on file    Physically abused: Not on file    Forced sexual activity: Not on file  Other Topics Concern  . Not on file  Social History Narrative   Lives with wife.  They have three children.   Daughter is an Ship broker     Family History  Problem Relation Age of Onset  . COPD Father        Died, 71  . Hypertension Father   . Stroke Father   . CAD Father   . Diabetes Mother   . Hypertension Mother   . CAD Mother           . Colon polyps Mother   . Cancer Mother        Ovarian cancer  . Hypertension Sister   . High Cholesterol Sister   . Colon cancer Maternal Aunt   . Prostate cancer Neg Hx   . Esophageal cancer Neg Hx   . Rectal cancer Neg Hx   . Stomach cancer Neg Hx   . Pancreatic cancer Neg Hx      Allergies as of 06/10/2018      Reactions   Sulfa Drugs  Cross Reactors Nausea And Vomiting      Medication List        Accurate as of 06/10/18 11:59 PM. Always use your most recent med list.          amLODipine 2.5 MG tablet Commonly known as:  NORVASC Take 1 tablet (2.5 mg total) by mouth daily.   azelastine 0.1 % nasal spray Commonly known as:  ASTELIN Place 2 sprays into both nostrils at bedtime as needed for rhinitis. Use in each nostril as directed   cromolyn 5.2 MG/ACT nasal spray Commonly known as:  NASALCROM Place 1 spray into both nostrils 4 (four) times daily.   Fish Oil 1000 MG Caps Take 1 capsule by mouth 2 (two) times daily.   gabapentin 300 MG capsule Commonly known as:  NEURONTIN Take 1 capsule (300 mg total) by mouth at bedtime. NEEDS OFFICE VISIT FOR FUTURE REFILLS   lidocaine 5 % ointment Commonly known as:  XYLOCAINE Apply 1 application topically as needed.   MULTI-VITAMINS Tabs Take by mouth.   OVER THE COUNTER MEDICATION Take 1 tablet by mouth 2 (two) times daily. AREDS 2   tadalafil 5 MG tablet Commonly known as:  CIALIS Take 5 mg by mouth daily.   testosterone cypionate 100 MG/ML injection Commonly known as:  DEPOTESTOTERONE CYPIONATE Inject into the muscle every 14 (fourteen) days. For IM use only           Objective:   Physical Exam  Musculoskeletal:       Arms:  BP 124/68 (BP Location: Left Arm, Patient Position: Sitting, Cuff Size: Small)   Pulse 66   Temp 98.1 F (36.7 C) (Oral)   Resp 16   Ht 6' (1.829 m)   Wt 187 lb 6 oz (85 kg)   SpO2 98%   BMI 25.41 kg/m  General: Well developed, NAD, BMI noted Neck: No  thyromegaly.  He did have moderate TTP left from the thyroid gland, the area was free of any mass or lymph node. HEENT:  Normocephalic . Face symmetric, atraumatic Lungs:  CTA B Normal  respiratory effort, no intercostal retractions, no accessory muscle use. Heart: RRR,  no murmur.  No pretibial edema bilaterally  Abdomen:  Not distended, soft, non-tender. No  rebound or rigidity.   Skin: Exposed areas without rash. Not pale. Not jaundice Neurologic:  alert & oriented X3.  Speech normal, gait appropriate for age and unassisted Strength symmetric and appropriate for age.  Psych: Cognition and judgment appear intact.  Cooperative with normal attention span and concentration.  Behavior appropriate. No anxious or depressed appearing.     Assessment & Plan:     Assessment   Osteoporosis, idiopathic, follow-up at [facility]. Raynaud phenomena 2013--Dr Truslow, amlodipine prn. Now sees another rheumatologist [facility] -- s/p Cervical fusion --Chronic back pain: used to be on Hydrocodone rx by pcp;  on gabapentin ) -- L shoulder adhesive capsulitis -- stress fracture L sesamoid (foot) dx 2018) GU: Dr. 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  Recommend to add Flonase OTC, prescription for cromolyn sent.  If not better patient will call for a referral.  ENT ? Allergist?. Left foot injury, reports the dx of stress fracture: Pain has decreased, still unable to run as much as he like it to. Slightly tender, left from the thyroid: Exam is actually benign. Recommend to monitor the area and call me if there is any problem Multiple other medical problems: Managed elsewhere. RTC 1 year

## 2018-06-10 NOTE — Progress Notes (Signed)
Pre visit review using our clinic review tool, if applicable. No additional management support is needed unless otherwise documented below in the visit note. 

## 2018-06-10 NOTE — Patient Instructions (Signed)
GO TO THE LAB : Get the blood work     GO TO THE FRONT DESK Schedule your next appointment for a physical exam in 1 year  Please see your orthopedic doctor for hand pain  Rhinitis: Continue Astelin Restart Flonase daily Start cromolyn 3-4 times a day If that is not helping, please call for a allergist or ENT referral

## 2018-06-10 NOTE — Assessment & Plan Note (Addendum)
-  Td 2013; Zostavax : 2014; s/p shingrex x 2;  had a flu shot -CCS:  Colonoscopy Dr Waverly Ferrari, first [place] 2004 (polyp) , then 01/02/2008 (tubular adenoma), then 10-2015:1 polyp, 5 years  -PSA per urology -Diet, exercise:  Discussed. Doing great w/ diet, not exercising much, + intentional wt loss  -labs CMP, FLP, A1c

## 2018-06-12 NOTE — Assessment & Plan Note (Signed)
Pain, base of the left thumb: Tendinitis versus OA, recommend to see his orthopedic doctor Perennial rhinitis: Currently on Claritin and Astelin.  Recommend to add Flonase OTC, prescription for cromolyn sent.  If not better patient will call for a referral.  ENT ? Allergist?. Left foot injury, reports the dx of stress fracture: Pain has decreased, still unable to run as much as he like it to. Slightly tender, left from the thyroid: Exam is actually benign. Recommend to monitor the area and call me if there is any problem Multiple other medical problems: Managed elsewhere. RTC 1 year

## 2018-06-14 ENCOUNTER — Other Ambulatory Visit (HOSPITAL_COMMUNITY): Payer: Self-pay | Admitting: Interventional Radiology

## 2018-06-14 ENCOUNTER — Other Ambulatory Visit: Payer: Self-pay | Admitting: Interventional Radiology

## 2018-06-14 DIAGNOSIS — I8311 Varicose veins of right lower extremity with inflammation: Secondary | ICD-10-CM

## 2018-06-18 ENCOUNTER — Encounter: Payer: Self-pay | Admitting: Emergency Medicine

## 2018-06-18 ENCOUNTER — Emergency Department
Admission: EM | Admit: 2018-06-18 | Discharge: 2018-06-18 | Disposition: A | Discharge status: Home / Self Care | Payer: 59 | Source: Home / Self Care

## 2018-06-18 DIAGNOSIS — J029 Acute pharyngitis, unspecified: Secondary | ICD-10-CM | POA: Diagnosis not present

## 2018-06-18 NOTE — Discharge Instructions (Signed)
  You may take 500mg acetaminophen every 4-6 hours or in combination with ibuprofen 400-600mg every 6-8 hours as needed for pain, inflammation, and fever.  Be sure to well hydrated with clear liquids and get at least 8 hours of sleep at night, preferably more while sick.   Please follow up with family medicine in 1 week if needed.   

## 2018-06-18 NOTE — ED Provider Notes (Signed)
[name]    CSN: [id number] Arrival date & time: 06/18/18  1125     History   Chief Complaint Chief Complaint  Patient presents with  . Sore Throat    HPI Erik [name] is a 62 y.o. male.   HPI Erik [name] is a 62 y.o. male presenting to UC with c/o sudden onset sore throat that started yesterday and worsened last night.  He also started a new nasal spray for rhinitis and is concerned it may have contributed to his sore throat. Mild nasal congestion. Mild cough from post-nasal drip.  Throat pain has improved today compared to last night. Denies ear pain or headache. Denies fever, chills, n/v/d.   Past Medical History:  Diagnosis Date  . ALLERGIC RHINITIS 12/19/2009  . Allergy   . Back pain chronic    On hydrocodone-Neurontin  . Blepharitis    B, chronic  . BPH (benign prostatic hyperplasia)   . Chronic prostatitis     f/u by urology, has a prostate massage prn  . GERD (gastroesophageal reflux disease)   . Groin injury   . HYPOGONADISM, MALE 08/11/2007   f/u by Dr Thomasene Mohair urology    . OSTEOPOROSIS, IDIOPATHIC 08/11/2007   f/u at [facility] , now improved to osteopenia  . Raynaud's disease /phenomenon 08/16/2012  . Rosacea   . Shingles 09-2014  . Varicose veins     Patient Active Problem List   Diagnosis Date Noted  . Raynaud's disease without gangrene 10/08/2017  . 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 Lumbar radiculopathy 03/25/2017  . Degenerative cervical disc 06/08/2016  . Degenerative lumbar disc 06/08/2016  . PCP NOTES >>> 04/29/2015  . Binocular vision disorder with diplopia 10/01/2014  . 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 Herpes zoster-- 09-2014 09/05/2014  . Anal fissure 02/28/2014  . Chronic prostatitis 02/28/2014  . Abnormal prostate specific antigen 02/28/2014  . Acid reflux 02/28/2014  . Ganglion of tendon 02/28/2014  . Incomplete bladder emptying 02/28/2014  . FOM  (frequency of micturition) 02/28/2014  . Nonexudative age-related macular degeneration 02/12/2014  . 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 Annual physical exam 10/21/2011  . Neck -- back pain- UDS 09/18/2011  . ALLERGIC RHINITIS 12/19/2009  . PELVIC PAIN, CHRONIC 04/03/2009  . Hypogonadism -- per urology 08/11/2007  . OSTEOPOROSIS, IDIOPATHIC 08/11/2007  . HEADACHE 08/11/2007    Past Surgical History:  Procedure Laterality Date  . BLEPHAROPLASTY     B blepharoplasty @ [facility] 08-2011  . CERVICAL DISCECTOMY  1998  . CERVICAL FUSION  1994   C4-C5-C6 fusion  . COLONOSCOPY    . EYE SURGERY     eyelid surgery on both eyes  . HERNIA REPAIR Left 1979  . IR GENERIC HISTORICAL  03/17/2016   IR EMBO VENOUS NOT HEMORR HEMANG  INC GUIDE ROADMAPPING 03/17/2016 Greggory Keen, MD [facility] ULTRASOUND  . KNEE ARTHROSCOPY    . POLYPECTOMY    . SHOULDER SURGERY Right    early 2000s  . SIGMOIDOSCOPY    . TRANSURETHRAL RESECTION OF PROSTATE  07-2009       Home Medications    Prior to Admission medications   Medication Sig Start Date End Date Taking? Authorizing Provider  amLODipine (NORVASC) 2.5 MG tablet Take 1 tablet (2.5 mg total) by mouth daily. 10/20/17   Bo Merino, MD  azelastine (ASTELIN) 0.1 % nasal spray Place 2 sprays into both nostrils at bedtime as  needed for rhinitis. Use in each nostril as directed 06/09/17   Colon Branch, MD  cromolyn (NASALCROM) 5.2 MG/ACT nasal spray Place 1 spray into both nostrils 4 (four) times daily. 06/10/18   Colon Branch, MD  gabapentin (NEURONTIN) 300 MG capsule Take 1 capsule (300 mg total) by mouth at bedtime. NEEDS OFFICE VISIT FOR FUTURE REFILLS 06/06/18   Lyndal Pulley, DO  lidocaine (XYLOCAINE) 5 % ointment Apply 1 application topically as needed. 09/20/17   Levin Erp, PA  Multiple Vitamin (MULTI-VITAMINS) TABS Take by mouth.    [provider]  Omega-3 Fatty Acids (FISH OIL) 1000 MG CAPS Take 1 capsule by mouth 2  (two) times daily.    [provider]  OVER THE COUNTER MEDICATION Take 1 tablet by mouth 2 (two) times daily. AREDS 2    [provider]  tadalafil (CIALIS) 5 MG tablet Take 5 mg by mouth daily.    [provider]  testosterone cypionate (DEPOTESTOTERONE CYPIONATE) 100 MG/ML injection Inject into the muscle every 14 (fourteen) days. For IM use only    [provider]    Family History Family History  Problem Relation Age of Onset  . COPD Father        Died, 71  . Hypertension Father   . Stroke Father   . CAD Father   . Diabetes Mother   . Hypertension Mother   . CAD Mother           . Colon polyps Mother   . Cancer Mother        Ovarian cancer  . Hypertension Sister   . High Cholesterol Sister   . Colon cancer Maternal Aunt   . Prostate cancer Neg Hx   . Esophageal cancer Neg Hx   . Rectal cancer Neg Hx   . Stomach cancer Neg Hx   . Pancreatic cancer Neg Hx     Social History Social History   Tobacco Use  . Smoking status: Never Smoker  . Smokeless tobacco: Never Used  Substance Use Topics  . Alcohol use: No    Alcohol/week: 0.0 standard drinks  . Drug use: No     Allergies   Sulfa drugs cross reactors   Review of Systems Review of Systems  Constitutional: Negative for chills and fever.  HENT: Positive for congestion, postnasal drip and sore throat. Negative for ear pain, trouble swallowing and voice change.   Respiratory: Positive for cough ( minimal). Negative for shortness of breath.   Cardiovascular: Negative for chest pain and palpitations.  Gastrointestinal: Negative for abdominal pain, diarrhea, nausea and vomiting.  Musculoskeletal: Negative for arthralgias, back pain and myalgias.  Skin: Negative for rash.     Physical Exam Triage Vital Signs ED Triage Vitals [06/18/18 1154]  Enc Vitals Group     BP 114/79     Pulse Rate 77     Resp      Temp 98.2 F (36.8 C)     Temp Source Oral     SpO2 97 %      Weight 194 lb 8 oz (88.2 kg)     Height 6' (1.829 m)     Head Circumference      Peak Flow      Pain Score 0     Pain Loc      Pain Edu?      Excl. in [place]?    No data found.  Updated Vital Signs BP 114/79 (BP Location:  Right Arm)   Pulse 77   Temp 98.2 F (36.8 C) (Oral)   Ht 6' (1.829 m)   Wt 194 lb 8 oz (88.2 kg)   SpO2 97%   BMI 26.38 kg/m   Visual Acuity Right Eye Distance:   Left Eye Distance:   Bilateral Distance:    Right Eye Near:   Left Eye Near:    Bilateral Near:     Physical Exam Vitals signs and nursing note reviewed.  Constitutional:      Appearance: He is well-developed.  HENT:     Head: Normocephalic and atraumatic.     Right Ear: Tympanic membrane normal.     Left Ear: Tympanic membrane normal.     Nose: Nose normal.     Mouth/Throat:     Lips: Pink.     Mouth: Mucous membranes are moist.     Pharynx: Oropharynx is clear. Uvula midline. Posterior oropharyngeal erythema present. No pharyngeal swelling, oropharyngeal exudate or uvula swelling.     Tonsils: No tonsillar exudate or tonsillar abscesses.  Neck:     Musculoskeletal: Normal range of motion and neck supple.  Cardiovascular:     Rate and Rhythm: Normal rate and regular rhythm.  Pulmonary:     Effort: Pulmonary effort is normal. No respiratory distress.     Breath sounds: Normal breath sounds. No stridor. No wheezing or rhonchi.  Musculoskeletal: Normal range of motion.  Lymphadenopathy:     Cervical: Cervical adenopathy present.  Skin:    General: Skin is warm and dry.  Neurological:     Mental Status: He is alert and oriented to person, place, and time.  Psychiatric:        Behavior: Behavior normal.      UC Treatments / Results  Labs (all labs ordered are listed, but only abnormal results are displayed) Labs Reviewed  STREP A DNA PROBE  POCT RAPID STREP A (OFFICE)    EKG None  Radiology No results found.  Procedures Procedures (including critical care  time)  Medications Ordered in UC Medications - No data to display  Initial Impression / Assessment and Plan / UC Course  I have reviewed the triage vital signs and the nursing notes.  Pertinent labs & imaging results that were available during my care of the patient were reviewed by me and considered in my medical decision making (see chart for details).     Rapid strep: NEGATIVE Culture sent Symptoms likely viral in nature, recommended pt stop new medication while having sore throat, may restart when feeling better to see if it triggers another sore throat. Home care info provided.  Final Clinical Impressions(s) / UC Diagnoses   Final diagnoses:  Sore throat     Discharge Instructions      You may take 500mg  acetaminophen every 4-6 hours or in combination with ibuprofen 400-600mg  every 6-8 hours as needed for pain, inflammation, and fever.  Be sure to well hydrated with clear liquids and get at least 8 hours of sleep at night, preferably more while sick.   Please follow up with family medicine in 1 week if needed.     ED Prescriptions    None     Controlled Substance Prescriptions [place] Controlled Substance Registry consulted? Not Applicable   Tyrell Antonio 06/18/18 1312

## 2018-06-18 NOTE — ED Triage Notes (Signed)
Patient c/o sore throat started yesterday, patient did start a new nasal spray and wasn't for sure if it's related or not.  Some congestion, no other sxs.

## 2018-06-20 ENCOUNTER — Telehealth: Payer: Self-pay | Admitting: *Deleted

## 2018-06-20 LAB — STREP A DNA PROBE: Group A Strep Probe: NOT DETECTED

## 2018-06-20 NOTE — Telephone Encounter (Signed)
Spoke to pt given Tcx results. He reports that he still has congestion, but he is feeling some better. Advised him to call back if he has any questions or concerns.

## 2018-06-23 DIAGNOSIS — H2513 Age-related nuclear cataract, bilateral: Secondary | ICD-10-CM | POA: Diagnosis not present

## 2018-06-23 DIAGNOSIS — H353133 Nonexudative age-related macular degeneration, bilateral, advanced atrophic without subfoveal involvement: Secondary | ICD-10-CM | POA: Diagnosis not present

## 2018-06-27 DIAGNOSIS — N411 Chronic prostatitis: Secondary | ICD-10-CM | POA: Diagnosis not present

## 2018-06-27 DIAGNOSIS — E291 Testicular hypofunction: Secondary | ICD-10-CM | POA: Diagnosis not present

## 2018-07-05 ENCOUNTER — Encounter: Payer: Self-pay | Admitting: Internal Medicine

## 2018-07-05 DIAGNOSIS — J310 Chronic rhinitis: Secondary | ICD-10-CM

## 2018-07-12 ENCOUNTER — Other Ambulatory Visit (HOSPITAL_COMMUNITY): Payer: Self-pay | Admitting: Interventional Radiology

## 2018-07-12 ENCOUNTER — Ambulatory Visit
Admission: RE | Admit: 2018-07-12 | Discharge: 2018-07-12 | Disposition: A | Discharge status: Home / Self Care | Payer: 59 | Source: Ambulatory Visit | Attending: Interventional Radiology | Admitting: Interventional Radiology

## 2018-07-12 ENCOUNTER — Other Ambulatory Visit: Payer: Self-pay | Admitting: Radiology

## 2018-07-12 DIAGNOSIS — I8311 Varicose veins of right lower extremity with inflammation: Secondary | ICD-10-CM

## 2018-07-12 MED ORDER — DIAZEPAM 10 MG PO TABS: 10.0000 mg | ORAL_TABLET | Freq: Once | ORAL | 0 refills | Status: AC

## 2018-07-12 NOTE — Progress Notes (Signed)
Patient ID: Erik [name], male   DOB: 20-Feb-1956, 63 y.o.   MRN: [id number]       Chief Complaint:   Chronic bilateral lower extremity varicose veins, progressive right greater than left calf pain with enlarging peripheral varicosities.  History of bilateral venous insufficiency and laser ablations.  Referring Physician(s): Charlottie Peragine  History of Present Illness: Erik [name] is a 63 y.o. male with a prior history of bilateral GSV transcatheter laser occlusions and bilateral calf varicosities.  He was treated several years ago and has not been seen since 2016.  He re-presented in October 2019 for evaluation of recurrent right greater than left calf varicosities with edema and leg pain despite conservative management.  He has been compliant with compression stockings, walking and weight management.  No interval spontaneous bleeding or ulceration.  No fevers.  No significant skin lesions.  He presents today for right anterolateral saphenous and small saphenous possible laser occlusion and right calf sclerotherapy.  Past Medical History:  Diagnosis Date  . ALLERGIC RHINITIS 12/19/2009  . Allergy   . Back pain chronic    On hydrocodone-Neurontin  . Blepharitis    B, chronic  . BPH (benign prostatic hyperplasia)   . Chronic prostatitis     f/u by urology, has a prostate massage prn  . GERD (gastroesophageal reflux disease)   . Groin injury   . HYPOGONADISM, MALE 08/11/2007   f/u by Dr Thomasene Mohair urology    . OSTEOPOROSIS, IDIOPATHIC 08/11/2007   f/u at [facility] , now improved to osteopenia  . Raynaud's disease /phenomenon 08/16/2012  . Rosacea   . Shingles 09-2014  . Varicose veins     Past Surgical History:  Procedure Laterality Date  . BLEPHAROPLASTY     B blepharoplasty @ [facility] 08-2011  . CERVICAL DISCECTOMY  1998  . CERVICAL FUSION  1994   C4-C5-C6 fusion  . COLONOSCOPY    . EYE SURGERY     eyelid surgery on both eyes  . HERNIA REPAIR Left 1979  . IR GENERIC HISTORICAL   03/17/2016   IR EMBO VENOUS NOT HEMORR HEMANG  INC GUIDE ROADMAPPING 03/17/2016 Greggory Keen, MD [facility] ULTRASOUND  . KNEE ARTHROSCOPY    . POLYPECTOMY    . SHOULDER SURGERY Right    early 2000s  . SIGMOIDOSCOPY    . TRANSURETHRAL RESECTION OF PROSTATE  07-2009    Allergies: Sulfa drugs cross reactors  Medications: Prior to Admission medications   Medication Sig Start Date End Date Taking? Authorizing Provider  amLODipine (NORVASC) 2.5 MG tablet Take 1 tablet (2.5 mg total) by mouth daily. 10/20/17   Bo Merino, MD  azelastine (ASTELIN) 0.1 % nasal spray Place 2 sprays into both nostrils at bedtime as needed for rhinitis. Use in each nostril as directed 06/09/17   Colon Branch, MD  cromolyn (NASALCROM) 5.2 MG/ACT nasal spray Place 1 spray into both nostrils 4 (four) times daily. 06/10/18   Colon Branch, MD  diazepam (VALIUM) 10 MG tablet Take 1 tablet (10 mg total) by mouth once for 1 dose. 07/12/18 07/12/18  Greggory Keen, MD  gabapentin (NEURONTIN) 300 MG capsule Take 1 capsule (300 mg total) by mouth at bedtime. NEEDS OFFICE VISIT FOR FUTURE REFILLS 06/06/18   Lyndal Pulley, DO  lidocaine (XYLOCAINE) 5 % ointment Apply 1 application topically as needed. 09/20/17   Levin Erp, PA  Multiple Vitamin (MULTI-VITAMINS) TABS Take by mouth.    [provider]  Omega-3 Fatty Acids (FISH OIL) 1000  MG CAPS Take 1 capsule by mouth 2 (two) times daily.    [provider]  OVER THE COUNTER MEDICATION Take 1 tablet by mouth 2 (two) times daily. AREDS 2    [provider]  tadalafil (CIALIS) 5 MG tablet Take 5 mg by mouth daily.    [provider]  testosterone cypionate (DEPOTESTOTERONE CYPIONATE) 100 MG/ML injection Inject into the muscle every 14 (fourteen) days. For IM use only    [provider]     Family History  Problem Relation Age of Onset  . COPD Father        Died, 71  . Hypertension Father   . Stroke Father   . CAD Father   .  Diabetes Mother   . Hypertension Mother   . CAD Mother           . Colon polyps Mother   . Cancer Mother        Ovarian cancer  . Hypertension Sister   . High Cholesterol Sister   . Colon cancer Maternal Aunt   . Prostate cancer Neg Hx   . Esophageal cancer Neg Hx   . Rectal cancer Neg Hx   . Stomach cancer Neg Hx   . Pancreatic cancer Neg Hx     Social History   Socioeconomic History  . Marital status: Married    Spouse name: Not on file  . Number of children: 3  . Years of education: Not on file  . Highest education level: Not on file  Occupational History  . Occupation: [employer]   Social Needs  . Financial resource strain: Not on file  . Food insecurity:    Worry: Not on file    Inability: Not on file  . Transportation needs:    Medical: Not on file    Non-medical: Not on file  Tobacco Use  . Smoking status: Never Smoker  . Smokeless tobacco: Never Used  Substance and Sexual Activity  . Alcohol use: No    Alcohol/week: 0.0 standard drinks  . Drug use: No  . Sexual activity: Not on file  Lifestyle  . Physical activity:    Days per week: Not on file    Minutes per session: Not on file  . Stress: Not on file  Relationships  . Social connections:    Talks on phone: Not on file    Gets together: Not on file    Attends religious service: Not on file    Active member of club or organization: Not on file    Attends meetings of clubs or organizations: Not on file    Relationship status: Not on file  Other Topics Concern  . Not on file  Social History Narrative   Lives with wife.  They have three children.   Daughter is an Ship broker       Review of Systems: A 12 point ROS discussed and pertinent positives are indicated in the HPI above.  All other systems are negative.  Review of Systems  Vital Signs: BP 131/65   Pulse 75   Temp 98.5 F (36.9 C) (Oral)   Resp 14   SpO2 100%   Physical Exam Constitutional:      Appearance: Normal appearance. He  is normal weight. He is not diaphoretic.  Musculoskeletal: Normal range of motion.        General: No swelling or tenderness.     Right lower leg: Edema present.     Left lower leg:  No edema.     Comments: Persistent right distal thigh and calf varicosities upon standing.  No skin lesions or ulcerations.  Minor right lower extremity peripheral edema.  Skin:    General: Skin is warm and dry.     Coloration: Skin is not jaundiced.     Findings: No erythema or lesion.  Neurological:     Mental Status: He is alert.      Imaging: [place] Injec Sclerotherapy Mult  Result Date: 07/12/2018 INDICATION: Symptomatic right lower extremity varicose veins with thigh and calf pain. History of saphenous venous insufficiency and prior transcatheter laser occlusions. EXAM: ULTRASOUND INJECTION OF SCLEROSANT; MULTIPLE INCOMPETENT VEINS (SAME LEG) MEDICATIONS: None. ANESTHESIA/SEDATION: None. COMPLICATIONS: None immediate. PROCEDURE: Informed written consent was obtained from the patient after a thorough discussion of the procedural risks, benefits and alternatives. All questions were addressed. Maximal Sterile Barrier Technique was utilized including caps, mask, sterile gowns, sterile gloves, sterile drape, hand hygiene and skin antiseptic. A timeout was performed prior to the initiation of the procedure. Preliminary vein mapping performed of the right lower extremity thigh and calf region. Patient has undergone previous right GSV transcatheter laser occlusion remotely. He has a small anterolateral saphenous branch and a calf region GSV. These segments demonstrate wall thickening and are small in caliber but do connect to the developing thigh and calf as well as ankle varicosities. Under sterile conditions and local anesthesia, tips remain to access the recanalized calf GSV segment however this was unsuccessful in passing a guidewire. A similar fashion the tips remain to access the lower thigh region recanalized  anterolateral saphenous branch. A guidewire would not pass. Therefore transcatheter laser occlusion could not be performed. Varicosities were localized in the lower thigh, proximal, mid and distal calf regions. These areas were marked. Ultrasound sclerotherapy performed at 4 sites of residual sub surface varicosities. A total of 2 cc foamed 1% Polidocanol all was injected with good this bursal throughout the varicosities. Images obtained for documentation. FINDINGS: Persistent incompetent symptomatic varicose veins located thigh and calf regions in the right lower extremity. Injection of 2 mL sclerosant. IMPRESSION: Technically successful foam sclerotherapy of residual symptomatic right lower extremity varicose veins. Electronically Signed   By: Jerilynn Mages.  Chala Gul M.D.   On: 07/12/2018 10:49   [place] Rad Eval And Mgmt  Result Date: 07/12/2018 Please refer to "Notes" to see consult details.   Labs:  CBC: Recent Labs    09/20/17 1038  WBC 3.7*  HGB 15.2  HCT 43.6  PLT 184.0    COAGS: No results for input(s): INR, APTT in the last 8760 hours.  BMP: Recent Labs    09/20/17 1038 06/10/18 0914  NA 139 140  K 3.8 4.5  CL 102 103  CO2 29 31  GLUCOSE 102* 99  BUN 24* 25*  CALCIUM 9.2 9.3  CREATININE 0.77 0.84    LIVER FUNCTION TESTS: Recent Labs    09/20/17 1038 06/10/18 0914  BILITOT 1.4* 1.3*  AST 18 18  ALT 19 20  ALKPHOS 64 60  PROT 6.4 6.2  ALBUMIN 4.1 4.4    TUMOR MARKERS: No results for input(s): AFPTM, CEA, CA199, CHROMGRNA in the last 8760 hours.  Assessment and Plan:  Preliminary venous mapping today demonstrates a smaller caliber anterolateral saphenous vein and infrapopliteal calf great saphenous vein.  There are branching varicosities.  Area was marked and mapped for attempted transcatheter laser occlusion and sclerotherapy.  Unfortunately, several attempts at access of the residual calf GSV and the lower thigh  anterolateral saphenous vein were unsuccessful.  Multiple  passes were made with a micropuncture needle at both sites.  Guidewires would not advance any significant distance to perform any laser treatment.  Following this, survey demonstrates residual patent subsurface varicosities in the thigh and calf.  Ultrasound foam sclerotherapy performed throughout the thigh and calf residual varicosity successfully with 4 punctures made.  2 cc foam sclerotherapy with 1% polidocanol administered throughout the thigh and calf varicosity successfully.  Image obtained for documentation.  Patient tolerated the procedure well.  No medial complication.  Plan: Continue daily compression stockings, walking, and outpatient follow-up in 1 month.  Thank you for this interesting consult.  I greatly enjoyed meeting KASEY EWINGS and look forward to participating in their care.  A copy of this report was sent to the requesting provider on this date.  Electronically Signed: Greggory Keen 07/12/2018, 2:06 PM   I spent a total of    40 Minutes in face to face in clinical consultation, greater than 50% of which was counseling/coordinating care for this patient with venous insufficiency and varicose veins.

## 2018-07-12 NOTE — Progress Notes (Signed)
3500 Informed consent obtained.  9381 Valium 10 mg po prior to procedure (per Dr Annamaria Boots).  0815  22 gauge x 1" extension started IV Right antecubital (x 1 attempt) with saline lock and 7" extension.  Flushed with 10 mL NSS without difficulty.  0940  Saline lock discontinued, catheter intact.  Site unremarkable.  0950  Patient given discharge instructions and patient states that he understands.  Patient's son will provide transportation to home.  Follow up appointment 08/04/2018 at 8 am.    Reece Levy, RN 07/12/2018 10:15 AM

## 2018-07-19 ENCOUNTER — Other Ambulatory Visit: Payer: 59

## 2018-07-26 ENCOUNTER — Encounter: Payer: Self-pay | Admitting: Pediatrics

## 2018-07-26 ENCOUNTER — Ambulatory Visit: Payer: 59 | Admitting: Pediatrics

## 2018-07-26 VITALS — BP 116/68 | HR 68 | Temp 97.8°F | Resp 20 | Ht 72.0 in | Wt 200.0 lb

## 2018-07-26 DIAGNOSIS — J3089 Other allergic rhinitis: Secondary | ICD-10-CM | POA: Diagnosis not present

## 2018-07-26 DIAGNOSIS — K219 Gastro-esophageal reflux disease without esophagitis: Secondary | ICD-10-CM

## 2018-07-26 DIAGNOSIS — H409 Unspecified glaucoma: Secondary | ICD-10-CM | POA: Diagnosis not present

## 2018-07-26 DIAGNOSIS — N400 Enlarged prostate without lower urinary tract symptoms: Secondary | ICD-10-CM | POA: Diagnosis not present

## 2018-07-26 MED ORDER — OMEPRAZOLE 40 MG PO CPDR: DELAYED_RELEASE_CAPSULE | ORAL | 5 refills | Status: DC

## 2018-07-26 MED ORDER — AMOXICILLIN-POT CLAVULANATE 875-125 MG PO TABS: 1.0000 | ORAL_TABLET | Freq: Two times a day (BID) | ORAL | 0 refills | Status: AC

## 2018-07-26 MED ORDER — IPRATROPIUM BROMIDE 0.03 % NA SOLN: NASAL | 5 refills | Status: DC

## 2018-07-26 MED FILL — IPRATROPIUM 0.03% SPRAY: 0.03 | 29 days supply | Qty: 30 | Fill #0

## 2018-07-26 MED FILL — OMEPRAZOLE 40 MG CPDR: 40 | 30 days supply | Qty: 60 | Fill #0

## 2018-07-26 MED FILL — AMOX-CLAV 875-125 MG TABLET: 875-125 | 10 days supply | Qty: 20 | Fill #0

## 2018-07-26 NOTE — Progress Notes (Addendum)
[place] [address] Dept: [phone]  New Patient Note  Patient ID: Erik [name], male    DOB: 1956-03-31  Age: 63 y.o. MRN: [id number] Date of Office Visit: 07/26/2018 Referring provider: Colon Branch, MD [address] [place], [name] [address]    Chief Complaint: Allergic Rhinitis  (alot of post nasal drainage.  only little nasal congestion.)  HPI Erik [name] presents for evaluation of a postnasal drainage for about 4 years.  His symptoms have been worse over the past year.  His postnasal drainage is worse after eating.  At times he has had some sneezing and a runny and a stuffy nose.  He has aggravation of his symptoms on exposure to dust, cigarette smoke, perfumes and colognes.  He has tried fluticasone, azelastine  and Nasalcrom nasal sprays with no improvement in his symptoms.  He has gastroesophageal reflux but does not take medications on a daily basis.  He has had osteoporosis in the past and has been on several studies at [facility].  He now has osteopenia.  He is on biweekly injections of testosterone cypionate.  He is being followed for the early development of glaucoma but does not need medications.  He has the early beginnings of macular degeneration.  He has never had asthmatic symptoms, eczema or chronic urticaria.  Review of Systems  Constitutional: Negative.   HENT:       Postnasal drainage for 3 or 4 years.  Sneezing and nasal congestion during this time.  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   Cardiovascular:       Hypertension at times  Gastrointestinal:       Heartburn at times  Genitourinary:       Enlarged prostate and history of chronic prostatitis  Musculoskeletal:       Right inguinal hernia.  Bone spurs removed.  Removal of ganglion cysts  Skin: Negative.   Neurological:       Ocular migraines.  Fusion of C4-5 and 6.  Micro discectomy of L4-L5  Endo/Heme/Allergies:        No diabetes or thyroid disease.  History of osteoporosis but now has osteopenia only  Psychiatric/Behavioral: Negative.     Outpatient Encounter Medications as of 07/26/2018  Medication Sig  . amLODipine (NORVASC) 2.5 MG tablet Take 1 tablet (2.5 mg total) by mouth daily.  Marland Kitchen azelastine (ASTELIN) 0.1 % nasal spray Place 2 sprays into both nostrils at bedtime as needed for rhinitis. Use in each nostril as directed  . cromolyn (NASALCROM) 5.2 MG/ACT nasal spray Place 1 spray into both nostrils 4 (four) times daily.  Marland Kitchen gabapentin (NEURONTIN) 300 MG capsule Take 1 capsule (300 mg total) by mouth at bedtime. NEEDS OFFICE VISIT FOR FUTURE REFILLS  . lidocaine (XYLOCAINE) 5 % ointment Apply 1 application topically as needed.  . Multiple Vitamin (MULTI-VITAMINS) TABS Take by mouth.  . Omega-3 Fatty Acids (FISH OIL) 1000 MG CAPS Take 1 capsule by mouth 2 (two) times daily.  Marland Kitchen omeprazole (PRILOSEC) 40 MG capsule Use 1 capsule once or twice a day for heartburn and reflux  . OVER THE COUNTER MEDICATION Take 1 tablet by mouth 2 (two) times daily. AREDS 2  . sildenafil (REVATIO) 20 MG tablet TAKE 1 TABLET (20 MG TOTAL) BY MOUTH AS NEEDED FOR UP TO 30 DAYS.  Marland Kitchen tadalafil (CIALIS) 5 MG tablet Take 5 mg by mouth daily.  Marland Kitchen  testosterone cypionate (DEPOTESTOTERONE CYPIONATE) 100 MG/ML injection Inject into the muscle every 14 (fourteen) days. For IM use only  . [DISCONTINUED] Multiple Vitamins-Minerals (MULTIVITAMIN PO) Take by mouth.  . [DISCONTINUED] omeprazole (PRILOSEC) 40 MG capsule Take 40 mg by mouth daily as needed.  Marland Kitchen amoxicillin-clavulanate (AUGMENTIN) 875-125 MG tablet Take 1 tablet by mouth 2 (two) times daily for 10 days.  Marland Kitchen ipratropium (ATROVENT) 0.03 % nasal spray Use 2 sprays per nostril 20 minutes before eating. May use 2 sprays per nostril 3 times a day.  . loratadine (CLARITIN) 10 MG tablet Take 10 mg by mouth daily as needed for allergies.   No facility-administered encounter medications  on file as of 07/26/2018.      Drug Allergies:  Allergies  Allergen Reactions  . Sulfa Drugs Cross Reactors Nausea And Vomiting    Family History: Erik [name]'s family history includes Allergic rhinitis in his father; CAD in his father and mother; COPD in his father; Cancer in his mother; Colon cancer in his maternal aunt; Colon polyps in his mother; Diabetes in his mother; High Cholesterol in his sister; Hypertension in his father, mother, and sister; Stroke in his father..  Family history is negative for asthma, hayfever, angioedema, eczema, hives food allergies, recurrent infections, chronic bronchitis.  Social and environmental.  There are no pets in the home.  He is not exposed to cigarette smoking.  He has not smoked cigarettes in the past.  He is a Cabin crew  Physical Exam: BP 116/68 (BP Location: Right Arm, Patient Position: Sitting, Cuff Size: Normal)   Pulse 68   Temp 97.8 F (36.6 C) (Oral)   Resp 20   Ht 6' (1.829 m)   Wt 200 lb (90.7 kg)   SpO2 96%   BMI 27.12 kg/m    Physical Exam Vitals signs reviewed.  Constitutional:      Appearance: Normal appearance. He is normal weight.  HENT:     Head:     Comments: Eyes normal.  Ears normal.  Nose normal.  Pharynx normal. Neck:     Musculoskeletal: Neck supple.     Comments: No thyromegaly Cardiovascular:     Rate and Rhythm: Normal rate and regular rhythm.     Comments: S1-S2 normal no murmurs Pulmonary:     Comments: Clear to percussion and auscultation Abdominal:     Palpations: Abdomen is soft.     Tenderness: There is no abdominal tenderness.     Comments: No hepatosplenomegaly  Lymphadenopathy:     Cervical: No cervical adenopathy.  Skin:    Comments: Clear  Neurological:     General: No focal deficit present.     Mental Status: He is alert and oriented to person, place, and time.  Psychiatric:        Mood and Affect: Mood normal.        Behavior: Behavior normal.        Thought Content: Thought content  normal.        Judgment: Judgment normal.     Diagnostics: Allergy skin test were positive to weed pollen, dust mites and mold   Assessment  Assessment and Plan: 1. Other allergic rhinitis   2. Gastroesophageal reflux disease without esophagitis   3. Early stage glaucoma   4. Enlarged prostate     Meds ordered this encounter  Medications  . amoxicillin-clavulanate (AUGMENTIN) 875-125 MG tablet    Sig: Take 1 tablet by mouth 2 (two) times daily for 10 days.    Dispense:  20  tablet    Refill:  0  . ipratropium (ATROVENT) 0.03 % nasal spray    Sig: Use 2 sprays per nostril 20 minutes before eating. May use 2 sprays per nostril 3 times a day.    Dispense:  30 mL    Refill:  5  . omeprazole (PRILOSEC) 40 MG capsule    Sig: Use 1 capsule once or twice a day for heartburn and reflux    Dispense:  60 capsule    Refill:  5    Patient Instructions  Environmental control of dust mite  and mold  Zyrtec 10 mg-take 1 tablet in the morning for runny nose drainage.  Benadryl 25 mg capsule-take 1 capsule at night for drainage Nasal saline irrigations at night followed by fluticasone 2 sprays per nostril at night Ipratropium 0.03% nasal spray- 2 sprays per nostril 20 minutes before eating.  You may use 2 sprays per nostril 3 times a day Augmentin 875 mg-take 1 tablet every 12 hours for 10 days for  Infection to see if the postnasal drainage improves Add prednisone 20 mg twice a day for 3 days, 20 mg on the fourth day, 10 mg on the fifth day to see if we can help the drainage Continue on your other medications Call us if you are not doing well on this treatment plan Omeprazole 20 mg-take 1 capsule once or twice a day for heartburn and reflux If you use omeprazole, use calcium citrate instead of calcium carbonate   Return in about 4 weeks (around 08/23/2018).   Thank you for the opportunity to care for this patient.  Please do not hesitate to contact me with questions.  Penne Lash,  M.D.  [facility] [address] [place], [place] [address] [phone]

## 2018-07-26 NOTE — Patient Instructions (Addendum)
Environmental control of dust mite  and mold  Zyrtec 10 mg-take 1 tablet in the morning for runny nose drainage.  Benadryl 25 mg capsule-take 1 capsule at night for drainage Nasal saline irrigations at night followed by fluticasone 2 sprays per nostril at night Ipratropium 0.03% nasal spray- 2 sprays per nostril 20 minutes before eating.  You may use 2 sprays per nostril 3 times a day Augmentin 875 mg-take 1 tablet every 12 hours for 10 days for  Infection to see if the postnasal drainage improves Add prednisone 20 mg twice a day for 3 days, 20 mg on the fourth day, 10 mg on the fifth day to see if we can help the drainage Continue on your other medications Call us if you are not doing well on this treatment plan Omeprazole 20 mg-take 1 capsule once or twice a day for heartburn and reflux If you use omeprazole, use calcium citrate instead of calcium carbonate

## 2018-08-01 ENCOUNTER — Encounter: Payer: Self-pay | Admitting: Family Medicine

## 2018-08-01 ENCOUNTER — Ambulatory Visit: Payer: 59 | Admitting: Family Medicine

## 2018-08-01 ENCOUNTER — Ambulatory Visit: Payer: Self-pay

## 2018-08-01 ENCOUNTER — Ambulatory Visit (INDEPENDENT_AMBULATORY_CARE_PROVIDER_SITE_OTHER)
Admission: RE | Admit: 2018-08-01 | Discharge: 2018-08-01 | Disposition: A | Discharge status: Home / Self Care | Payer: 59 | Source: Ambulatory Visit | Attending: Family Medicine | Admitting: Family Medicine

## 2018-08-01 VITALS — BP 102/68 | Ht 72.0 in

## 2018-08-01 DIAGNOSIS — M545 Low back pain, unspecified: Secondary | ICD-10-CM

## 2018-08-01 DIAGNOSIS — M5030 Other cervical disc degeneration, unspecified cervical region: Secondary | ICD-10-CM | POA: Diagnosis not present

## 2018-08-01 DIAGNOSIS — M48061 Spinal stenosis, lumbar region without neurogenic claudication: Secondary | ICD-10-CM | POA: Diagnosis not present

## 2018-08-01 DIAGNOSIS — M79642 Pain in left hand: Secondary | ICD-10-CM

## 2018-08-01 DIAGNOSIS — M5416 Radiculopathy, lumbar region: Secondary | ICD-10-CM | POA: Diagnosis not present

## 2018-08-01 NOTE — Progress Notes (Signed)
[name] [place], [place] [address] Phone: [phone] Subjective:   Erik [name], am serving as a scribe for Dr. Hulan [name].  CC: Back pain and neck pain follow-up  UDT:[id number]  Erik [name] is a 63 y.o. male coming in with complaint of back pain for the past 2 days that is so bad that he contemplated going into the ER. Has been on prednisone for nasal pressure. Has been having numbness for 6 months. Pain is worse in the morning when he arises. Is using Bayer for pain.  Patient states that it is unfortunately having worsening discomfort from time to time.  Patient denies any weakness  Does still have neck pain with radicular symptoms in the 4th and 5th fingers bilaterally. Also complains of left hand weakness in the hyperthenar eminence. Thinks he may have injured it is September handling his daughters dog.  Denies any associated neck pain with it.      Past Medical History:  Diagnosis Date  . ALLERGIC RHINITIS 12/19/2009  . Allergy   . Back pain chronic    On hydrocodone-Neurontin  . Blepharitis    B, chronic  . BPH (benign prostatic hyperplasia)   . Chronic prostatitis     f/u by urology, has a prostate massage prn  . GERD (gastroesophageal reflux disease)   . Groin injury   . HYPOGONADISM, MALE 08/11/2007   f/u by Dr Thomasene Mohair urology    . OSTEOPOROSIS, IDIOPATHIC 08/11/2007   f/u at [facility] , now improved to osteopenia  . Raynaud's disease /phenomenon 08/16/2012  . Rosacea   . Shingles 09-2014  . Varicose veins    Past Surgical History:  Procedure Laterality Date  . BLEPHAROPLASTY     B blepharoplasty @ [facility] 08-2011  . CERVICAL DISCECTOMY  1998  . CERVICAL FUSION  1994   C4-C5-C6 fusion  . COLONOSCOPY    . EYE SURGERY     eyelid surgery on both eyes  . HERNIA REPAIR Left 1979  . IR GENERIC HISTORICAL  03/17/2016   IR EMBO VENOUS NOT HEMORR HEMANG  INC GUIDE ROADMAPPING 03/17/2016 Greggory Keen, MD [facility] ULTRASOUND   . KNEE ARTHROSCOPY    . POLYPECTOMY    . SHOULDER SURGERY Right    early 2000s  . SIGMOIDOSCOPY    . TRANSURETHRAL RESECTION OF PROSTATE  07-2009   Social History   Socioeconomic History  . Marital status: Married    Spouse name: Not on file  . Number of children: 3  . Years of education: Not on file  . Highest education level: Not on file  Occupational History  . Occupation: [employer]   Social Needs  . Financial resource strain: Not on file  . Food insecurity:    Worry: Not on file    Inability: Not on file  . Transportation needs:    Medical: Not on file    Non-medical: Not on file  Tobacco Use  . Smoking status: Never Smoker  . Smokeless tobacco: Never Used  Substance and Sexual Activity  . Alcohol use: [name]    Alcohol/week: 0.0 standard drinks  . Drug use: [name]  . Sexual activity: Not on file  Lifestyle  . Physical activity:    Days per week: Not on file    Minutes per session: Not on file  . Stress: Not on file  Relationships  . Social connections:    Talks on phone: Not on file    Gets  together: Not on file    Attends religious service: Not on file    Active member of club or organization: Not on file    Attends meetings of clubs or organizations: Not on file    Relationship status: Not on file  Other Topics Concern  . Not on file  Social History Narrative   Lives with wife.  They have three children.   Daughter is an Ship broker     Allergies  Allergen Reactions  . Sulfa Drugs Cross Reactors Nausea And Vomiting   Family History  Problem Relation Age of Onset  . COPD Father        Died, 71  . Hypertension Father   . Stroke Father   . CAD Father   . Allergic rhinitis Father   . Diabetes Mother   . Hypertension Mother   . CAD Mother           . Colon polyps Mother   . Cancer Mother        Ovarian cancer  . Hypertension Sister   . High Cholesterol Sister   . Colon cancer Maternal Aunt   . Prostate cancer Neg Hx   . Esophageal cancer Neg Hx     . Rectal cancer Neg Hx   . Stomach cancer Neg Hx   . Pancreatic cancer Neg Hx   . Angioedema Neg Hx   . Asthma Neg Hx   . Eczema Neg Hx   . Immunodeficiency Neg Hx   . Urticaria Neg Hx     Current Outpatient Medications (Endocrine & Metabolic):  .  testosterone cypionate (DEPOTESTOTERONE CYPIONATE) 100 MG/ML injection, Inject into the muscle every 14 (fourteen) days. For IM use only  Current Outpatient Medications (Cardiovascular):  .  amLODipine (NORVASC) 2.5 MG tablet, Take 1 tablet (2.5 mg total) by mouth daily. .  sildenafil (REVATIO) 20 MG tablet, TAKE 1 TABLET (20 MG TOTAL) BY MOUTH AS NEEDED FOR UP TO 30 DAYS. Marland Kitchen  tadalafil (CIALIS) 5 MG tablet, Take 5 mg by mouth daily.  Current Outpatient Medications (Respiratory):  .  azelastine (ASTELIN) 0.1 % nasal spray, Place 2 sprays into both nostrils at bedtime as needed for rhinitis. Use in each nostril as directed .  cromolyn (NASALCROM) 5.2 MG/ACT nasal spray, Place 1 spray into both nostrils 4 (four) times daily. Marland Kitchen  ipratropium (ATROVENT) 0.03 % nasal spray, Use 2 sprays per nostril 20 minutes before eating. May use 2 sprays per nostril 3 times a day. .  loratadine (CLARITIN) 10 MG tablet, Take 10 mg by mouth daily as needed for allergies.    Current Outpatient Medications (Other):  .  amoxicillin-clavulanate (AUGMENTIN) 875-125 MG tablet, Take 1 tablet by mouth 2 (two) times daily for 10 days. Marland Kitchen  gabapentin (NEURONTIN) 300 MG capsule, Take 1 capsule (300 mg total) by mouth at bedtime. NEEDS OFFICE VISIT FOR FUTURE REFILLS .  lidocaine (XYLOCAINE) 5 % ointment, Apply 1 application topically as needed. .  Multiple Vitamin (MULTI-VITAMINS) TABS, Take by mouth. .  Omega-3 Fatty Acids (FISH OIL) 1000 MG CAPS, Take 1 capsule by mouth 2 (two) times daily. Marland Kitchen  omeprazole (PRILOSEC) 40 MG capsule, Use 1 capsule once or twice a day for heartburn and reflux .  OVER THE COUNTER MEDICATION, Take 1 tablet by mouth 2 (two) times daily. AREDS  2    Past medical history, social, surgical and family history all reviewed in electronic medical record.  [name] pertanent information unless stated regarding to the chief complaint.  Review of Systems:  [name] headache, visual changes, nausea, vomiting, diarrhea, constipation, dizziness, abdominal pain, skin rash, fevers, chills, night sweats, weight loss, swollen lymph nodes,  joint swelling, chest pain, shortness of breath, mood changes.  Positive muscle aches, body aches  Objective  Blood pressure 102/68, height 6' (1.829 m).    General: [name] apparent distress alert and oriented x3 mood and affect normal, dressed appropriately.  HEENT: Pupils equal, extraocular movements intact  Respiratory: Patient's speak in full sentences and does not appear short of breath  Cardiovascular: [name] lower extremity edema, non tender, [name] erythema  Skin: Warm dry intact with [name] signs of infection or rash on extremities or on axial skeleton.  Abdomen: Soft nontender  Neuro: Cranial nerves II through XII are intact, neurovascularly intact in all extremities with 2+ DTRs and 2+ pulses.  Lymph: [name] lymphadenopathy of posterior or anterior cervical chain or axillae bilaterally.  Gait normal with good balance and coordination.  MSK:  tender with full range of motion and good stability and symmetric strength and tone of shoulders, elbows, wrist, hip, knee and ankles bilaterally.  Neck exam does have some mild loss of lordosis.  Patient does have Mild positive Spurling's on the left upper extremity.  Patient does have weakness in the C6 and C7 distribution on the left.  Left CMC does have a positive grind.  Patient not does have weakness though noted.  Does have some thenar eminence wasting noted  Low back exam does have some mild loss of lordosis.  Nontender on exam, full range of motion.  Mild tightness with Corky Sox.  Negative straight leg test.  Patient does complain of numbness in the large toes but full strength noted.   Patient does withdrawal from pinching  Limited musculoskeletal ultrasound was performed and interpreted by Lyndal Pulley  Limited ultrasound shows the patient does have some mild CMC arthritis but [name] significant bony abnormality.  [name] synovitis noted.  [name] signs of any type of tendinitis.   Impression and Recommendations:     This case required medical decision making of moderate complexity. The above documentation has been reviewed and is accurate and complete Lyndal Pulley, DO       Note: This dictation was prepared with Dragon dictation along with smaller phrase technology. Any transcriptional errors that result from this process are unintentional.

## 2018-08-01 NOTE — Assessment & Plan Note (Signed)
Patient does have known radiculopathy previously.  Having more of an S1 nerve root she patient states.  No weakness no noted.  Discussed with patient in great length.  Patient has been put on multiple different medications recently.  We discussed with him that some of these could be contributing to some of the worsening discomfort and pain.  Recently was on prednisone and some acute adrenal insufficiency could also be contributing.  Discussed icing regimen and home exercise.  Discussed which activities to do which was to avoid.  Patient will follow-up with me again in 2 weeks to make sure improving appropriately.

## 2018-08-01 NOTE — Patient Instructions (Addendum)
Good to see you  I am sorry you feel the way you do  Lets give it time and get some of this other stuff out of your system DHEA 50 mg daily for 4 weeks may help if adrenal insufficiency form the prednisone  CoQ10 200mg  daily for muscle pain B12 1059mcg daily for nerve health with 200mg  of B6  Continue the gabapentin  I think the thumb is from the neck and if the weakness gets worse we need to do the epidural  See me again in 2 weeks but if ot better by Thursday call and we will consider doxy and flagyl if stomach worsens.

## 2018-08-01 NOTE — Assessment & Plan Note (Addendum)
Known arthritic changes with nerve impingement.  Discussed epidural.  Patient does not want to have an epidural at this point.  Refilled gabapentin.  Is doing well with this.  We will need to monitor with the weakness in the left hand.  Discussed labs at this point with patient declined as well.  May do that at follow-up as needed.

## 2018-08-02 DIAGNOSIS — L918 Other hypertrophic disorders of the skin: Secondary | ICD-10-CM | POA: Diagnosis not present

## 2018-08-02 DIAGNOSIS — L739 Follicular disorder, unspecified: Secondary | ICD-10-CM | POA: Diagnosis not present

## 2018-08-04 ENCOUNTER — Ambulatory Visit
Admission: RE | Admit: 2018-08-04 | Discharge: 2018-08-04 | Disposition: A | Discharge status: Home / Self Care | Payer: 59 | Source: Ambulatory Visit | Attending: Interventional Radiology | Admitting: Interventional Radiology

## 2018-08-04 DIAGNOSIS — I8311 Varicose veins of right lower extremity with inflammation: Secondary | ICD-10-CM | POA: Diagnosis not present

## 2018-08-04 DIAGNOSIS — I739 Peripheral vascular disease, unspecified: Secondary | ICD-10-CM | POA: Diagnosis not present

## 2018-08-04 DIAGNOSIS — Z9889 Other specified postprocedural states: Secondary | ICD-10-CM | POA: Diagnosis not present

## 2018-08-04 DIAGNOSIS — I8390 Asymptomatic varicose veins of unspecified lower extremity: Secondary | ICD-10-CM | POA: Diagnosis not present

## 2018-08-04 NOTE — Progress Notes (Signed)
Patient ID: Erik [name], male   DOB: [birth year], 63 y.o.   MRN: [id number]       Chief Complaint:   Chronic bilateral lower extremity varicose veins, worse on the right.  History of bilateral venous insufficiency and prior laser ablations.  Referring Physician(s): Paz  History of Present Illness: Erik [name] is a 63 y.o. male with a longstanding history of bilateral GSV venous insufficiency.  He has had previous bilateral GSV transcatheter laser occlusions and calf varicosity sclerotherapy.  These treatments were all done in 2016.  He represented in 2019 with recurrent symptomatic right calf varicosities, edema and leg pain despite conservative management.  He remains compliant with compression stockings, walking and weight management.  He is now 1 month status post right distal thigh and diffuse calf ultrasound foam sclerotherapy for residual varicosities.  Currently is asymptomatic.  No physical limitations.  His right lower extremity symptoms are improving.  Past Medical History:  Diagnosis Date  . ALLERGIC RHINITIS 12/19/2009  . Allergy   . Back pain chronic    On hydrocodone-Neurontin  . Blepharitis    B, chronic  . BPH (benign prostatic hyperplasia)   . Chronic prostatitis     f/u by urology, has a prostate massage prn  . GERD (gastroesophageal reflux disease)   . Groin injury   . HYPOGONADISM, MALE 08/11/2007   f/u by Dr Thomasene Mohair urology    . OSTEOPOROSIS, IDIOPATHIC 08/11/2007   f/u at [facility] , now improved to osteopenia  . Raynaud's disease /phenomenon 08/16/2012  . Rosacea   . Shingles 09-2014  . Varicose veins     Past Surgical History:  Procedure Laterality Date  . BLEPHAROPLASTY     B blepharoplasty @ [facility] 08-2011  . CERVICAL DISCECTOMY  1998  . CERVICAL FUSION  1994   C4-C5-C6 fusion  . COLONOSCOPY    . EYE SURGERY     eyelid surgery on both eyes  . HERNIA REPAIR Left 1979  . IR GENERIC HISTORICAL  03/17/2016   IR EMBO VENOUS NOT HEMORR HEMANG  INC  GUIDE ROADMAPPING 03/17/2016 Greggory Keen, MD [facility] ULTRASOUND  . KNEE ARTHROSCOPY    . POLYPECTOMY    . SHOULDER SURGERY Right    early 2000s  . SIGMOIDOSCOPY    . TRANSURETHRAL RESECTION OF PROSTATE  07-2009    Allergies: Sulfa drugs cross reactors  Medications: Prior to Admission medications   Medication Sig Start Date End Date Taking? Authorizing Provider  amLODipine (NORVASC) 2.5 MG tablet Take 1 tablet (2.5 mg total) by mouth daily. 10/20/17   Bo Merino, MD  amoxicillin-clavulanate (AUGMENTIN) 875-125 MG tablet Take 1 tablet by mouth 2 (two) times daily for 10 days. 07/26/18 08/05/18  Charlies Silvers, MD  azelastine (ASTELIN) 0.1 % nasal spray Place 2 sprays into both nostrils at bedtime as needed for rhinitis. Use in each nostril as directed 06/09/17   Colon Branch, MD  cromolyn (NASALCROM) 5.2 MG/ACT nasal spray Place 1 spray into both nostrils 4 (four) times daily. 06/10/18   Colon Branch, MD  gabapentin (NEURONTIN) 300 MG capsule Take 1 capsule (300 mg total) by mouth at bedtime. NEEDS OFFICE VISIT FOR FUTURE REFILLS 06/06/18   Lyndal Pulley, DO  ipratropium (ATROVENT) 0.03 % nasal spray Use 2 sprays per nostril 20 minutes before eating. May use 2 sprays per nostril 3 times a day. 07/26/18   Charlies Silvers, MD  lidocaine (XYLOCAINE) 5 % ointment Apply 1 application topically as needed. 09/20/17  Levin Erp, PA  loratadine (CLARITIN) 10 MG tablet Take 10 mg by mouth daily as needed for allergies.    [provider]  Multiple Vitamin (MULTI-VITAMINS) TABS Take by mouth.    [provider]  Omega-3 Fatty Acids (FISH OIL) 1000 MG CAPS Take 1 capsule by mouth 2 (two) times daily.    [provider]  omeprazole (PRILOSEC) 40 MG capsule Use 1 capsule once or twice a day for heartburn and reflux 07/26/18   Charlies Silvers, MD  OVER THE COUNTER MEDICATION Take 1 tablet by mouth 2 (two) times daily. AREDS 2    [provider]  sildenafil  (REVATIO) 20 MG tablet TAKE 1 TABLET (20 MG TOTAL) BY MOUTH AS NEEDED FOR UP TO 30 DAYS. 06/27/18   [provider]  tadalafil (CIALIS) 5 MG tablet Take 5 mg by mouth daily.    [provider]  testosterone cypionate (DEPOTESTOTERONE CYPIONATE) 100 MG/ML injection Inject into the muscle every 14 (fourteen) days. For IM use only    [provider]     Family History  Problem Relation Age of Onset  . COPD Father        Died, 71  . Hypertension Father   . Stroke Father   . CAD Father   . Allergic rhinitis Father   . Diabetes Mother   . Hypertension Mother   . CAD Mother           . Colon polyps Mother   . Cancer Mother        Ovarian cancer  . Hypertension Sister   . High Cholesterol Sister   . Colon cancer Maternal Aunt   . Prostate cancer Neg Hx   . Esophageal cancer Neg Hx   . Rectal cancer Neg Hx   . Stomach cancer Neg Hx   . Pancreatic cancer Neg Hx   . Angioedema Neg Hx   . Asthma Neg Hx   . Eczema Neg Hx   . Immunodeficiency Neg Hx   . Urticaria Neg Hx     Social History   Socioeconomic History  . Marital status: Married    Spouse name: Not on file  . Number of children: 3  . Years of education: Not on file  . Highest education level: Not on file  Occupational History  . Occupation: [employer]   Social Needs  . Financial resource strain: Not on file  . Food insecurity:    Worry: Not on file    Inability: Not on file  . Transportation needs:    Medical: Not on file    Non-medical: Not on file  Tobacco Use  . Smoking status: Never Smoker  . Smokeless tobacco: Never Used  Substance and Sexual Activity  . Alcohol use: No    Alcohol/week: 0.0 standard drinks  . Drug use: No  . Sexual activity: Not on file  Lifestyle  . Physical activity:    Days per week: Not on file    Minutes per session: Not on file  . Stress: Not on file  Relationships  . Social connections:    Talks on phone: Not on file    Gets together: Not on file      Attends religious service: Not on file    Active member of club or organization: Not on file    Attends meetings of clubs or organizations: Not on file    Relationship status: Not on file  Other Topics Concern  .  Not on file  Social History Narrative   Lives with wife.  They have three children.   Daughter is an Ship broker       Review of Systems: A 12 point ROS discussed and pertinent positives are indicated in the HPI above.  All other systems are negative.  Review of Systems  Vital Signs: There were no vitals taken for this visit.  Physical Exam Constitutional:      General: He is not in acute distress.    Appearance: Normal appearance. He is normal weight.  Musculoskeletal: Normal range of motion.        General: No swelling or tenderness.     Left lower leg: No edema.     Comments: Injection sites in the lower thigh and diffuse calf region have healed.  No signs of thrombophlebitis or cellulitis.  Skin intact.  Negative for edema.  Neurological:     Mental Status: He is alert.      Imaging: Dg Lumbar Spine 2-3 Views  Result Date: 08/01/2018 CLINICAL DATA:  63 year old male lower back pain for 4 days. Bilateral foot numbness. No known injury. Initial encounter. EXAM: LUMBAR SPINE - 2-3 VIEW COMPARISON:  07/07/2016 MR. FINDINGS: Scoliosis lumbar spine convex left.  No acute compression fracture. Minimal disc space narrowing T10-11 and T12-L1. L2-3 mild to slightly moderate disc space narrowing with minimal retrolisthesis L2. L3-4 marked right-sided and moderate left-sided disc space narrowing with osteophyte greater right lateral position. Minimal retrolisthesis L3. Minimal left-sided L4-5 disc space narrowing. IMPRESSION: 1. Scoliosis lumbar spine convex left. 2. L2-3 mild to slightly moderate disc space narrowing with minimal retrolisthesis L2. 3. L3-4 marked right-sided and moderate left-sided disc space narrowing with osteophyte greater right lateral position. Minimal  retrolisthesis L3. Electronically Signed   By: Genia Del M.D.   On: 08/01/2018 19:15   US Venous Img Lower Unilateral Right  Result Date: 08/04/2018 CLINICAL DATA:  Three weeks status post right lower extremity sclerotherapy EXAM: RIGHT LOWER EXTREMITY VENOUS DOPPLER ULTRASOUND TECHNIQUE: Gray-scale sonography with graded compression, as well as color Doppler and duplex ultrasound were performed to evaluate the lower extremity deep venous systems from the level of the common femoral vein and including the common femoral, femoral, profunda femoral, popliteal and calf veins including the posterior tibial, peroneal and gastrocnemius veins when visible. The superficial great saphenous vein was also interrogated. Spectral Doppler was utilized to evaluate flow at rest and with distal augmentation maneuvers in the common femoral, femoral and popliteal veins. COMPARISON:  None. FINDINGS: Contralateral Common Femoral Vein: Respiratory phasicity is normal and symmetric with the symptomatic side. No evidence of thrombus. Normal compressibility. Common Femoral Vein: No evidence of thrombus. Normal compressibility, respiratory phasicity and response to augmentation. Saphenofemoral Junction: No evidence of thrombus. Normal compressibility and flow on color Doppler imaging. Profunda Femoral Vein: No evidence of thrombus. Normal compressibility and flow on color Doppler imaging. Femoral Vein: No evidence of thrombus. Normal compressibility, respiratory phasicity and response to augmentation. Popliteal Vein: No evidence of thrombus. Normal compressibility, respiratory phasicity and response to augmentation. Calf Veins: No evidence of thrombus. Normal compressibility and flow on color Doppler imaging. Superficial Great Saphenous Vein: Not visualized, previously treated Venous Reflux:  None. Other Findings: Small lower thigh and calf sub surface varicosities are thrombosis following injection sclerotherapy. No significant  residual right lower extremity sub surface varicosities. IMPRESSION: Negative for right lower extremity DVT. Lower thigh and calf sub surface varicosities are thrombosed following sclerotherapy. Electronically Signed   By: Jerilynn Mages.  Hamna Asa M.D.   On: 08/04/2018 09:20   [place] Injec Sclerotherapy Mult  Result Date: 07/12/2018 INDICATION: Symptomatic right lower extremity varicose veins with thigh and calf pain. History of saphenous venous insufficiency and prior transcatheter laser occlusions. EXAM: ULTRASOUND INJECTION OF SCLEROSANT; MULTIPLE INCOMPETENT VEINS (SAME LEG) MEDICATIONS: None. ANESTHESIA/SEDATION: None. COMPLICATIONS: None immediate. PROCEDURE: Informed written consent was obtained from the patient after a thorough discussion of the procedural risks, benefits and alternatives. All questions were addressed. Maximal Sterile Barrier Technique was utilized including caps, mask, sterile gowns, sterile gloves, sterile drape, hand hygiene and skin antiseptic. A timeout was performed prior to the initiation of the procedure. Preliminary vein mapping performed of the right lower extremity thigh and calf region. Patient has undergone previous right GSV transcatheter laser occlusion remotely. He has a small anterolateral saphenous branch and a calf region GSV. These segments demonstrate wall thickening and are small in caliber but do connect to the developing thigh and calf as well as ankle varicosities. Under sterile conditions and local anesthesia, tips remain to access the recanalized calf GSV segment however this was unsuccessful in passing a guidewire. A similar fashion the tips remain to access the lower thigh region recanalized anterolateral saphenous branch. A guidewire would not pass. Therefore transcatheter laser occlusion could not be performed. Varicosities were localized in the lower thigh, proximal, mid and distal calf regions. These areas were marked. Ultrasound sclerotherapy performed at 4 sites of  residual sub surface varicosities. A total of 2 cc foamed 1% Polidocanol all was injected with good this bursal throughout the varicosities. Images obtained for documentation. FINDINGS: Persistent incompetent symptomatic varicose veins located thigh and calf regions in the right lower extremity. Injection of 2 mL sclerosant. IMPRESSION: Technically successful foam sclerotherapy of residual symptomatic right lower extremity varicose veins. Electronically Signed   By: Jerilynn Mages.  Samayra Hebel M.D.   On: 07/12/2018 10:49   [place] Rad Eval And Mgmt  Result Date: 08/04/2018 Please refer to "Notes" to see consult details.  [place] Rad Eval And Mgmt  Result Date: 07/12/2018 Please refer to "Notes" to see consult details.   Labs:  CBC: Recent Labs    09/20/17 1038  WBC 3.7*  HGB 15.2  HCT 43.6  PLT 184.0    COAGS: No results for input(s): INR, APTT in the last 8760 hours.  BMP: Recent Labs    09/20/17 1038 06/10/18 0914  NA 139 140  K 3.8 4.5  CL 102 103  CO2 29 31  GLUCOSE 102* 99  BUN 24* 25*  CALCIUM 9.2 9.3  CREATININE 0.77 0.84    LIVER FUNCTION TESTS: Recent Labs    09/20/17 1038 06/10/18 0914  BILITOT 1.4* 1.3*  AST 18 18  ALT 19 20  ALKPHOS 64 60  PROT 6.4 6.2  ALBUMIN 4.1 4.4    TUMOR MARKERS: No results for input(s): AFPTM, CEA, CA199, CHROMGRNA in the last 8760 hours.  Assessment and Plan:  1 month status post ultrasound foam sclerotherapy of small symptomatic lower thigh and diffuse calf residual varicosities.  No signs of cellulitis or phlebitis.  Ultrasound confirms thrombosis of the varicosities following injections.  He reports improvement in his right lower extremity symptoms.  No current leg pain or edema.  Overall he has pleased with the result.  Plan: Continue daily compression stockings, walking, and exercise.  Follow-up as needed for any further developing symptomatic varicosities.  Electronically Signed: Greggory Keen 08/04/2018, 9:25 AM   I spent a total of     15 Minutes  in face to face in clinical consultation, greater than 50% of which was counseling/coordinating care for this patient with venous insufficiency and varicose veins

## 2018-08-12 ENCOUNTER — Encounter: Payer: Self-pay | Admitting: Family Medicine

## 2018-08-17 DIAGNOSIS — H04123 Dry eye syndrome of bilateral lacrimal glands: Secondary | ICD-10-CM | POA: Diagnosis not present

## 2018-08-17 DIAGNOSIS — H353112 Nonexudative age-related macular degeneration, right eye, intermediate dry stage: Secondary | ICD-10-CM | POA: Diagnosis not present

## 2018-08-17 DIAGNOSIS — H524 Presbyopia: Secondary | ICD-10-CM | POA: Diagnosis not present

## 2018-08-17 DIAGNOSIS — H5213 Myopia, bilateral: Secondary | ICD-10-CM | POA: Diagnosis not present

## 2018-08-17 DIAGNOSIS — H353121 Nonexudative age-related macular degeneration, left eye, early dry stage: Secondary | ICD-10-CM | POA: Diagnosis not present

## 2018-08-17 DIAGNOSIS — H52223 Regular astigmatism, bilateral: Secondary | ICD-10-CM | POA: Diagnosis not present

## 2018-08-17 DIAGNOSIS — G43819 Other migraine, intractable, without status migrainosus: Secondary | ICD-10-CM | POA: Diagnosis not present

## 2018-08-23 DIAGNOSIS — M818 Other osteoporosis without current pathological fracture: Secondary | ICD-10-CM | POA: Diagnosis not present

## 2018-08-24 DIAGNOSIS — M9902 Segmental and somatic dysfunction of thoracic region: Secondary | ICD-10-CM | POA: Diagnosis not present

## 2018-08-24 DIAGNOSIS — M5432 Sciatica, left side: Secondary | ICD-10-CM | POA: Diagnosis not present

## 2018-08-24 DIAGNOSIS — M9905 Segmental and somatic dysfunction of pelvic region: Secondary | ICD-10-CM | POA: Diagnosis not present

## 2018-08-24 DIAGNOSIS — M9903 Segmental and somatic dysfunction of lumbar region: Secondary | ICD-10-CM | POA: Diagnosis not present

## 2018-08-25 ENCOUNTER — Telehealth: Payer: Self-pay | Admitting: *Deleted

## 2018-08-25 DIAGNOSIS — E291 Testicular hypofunction: Secondary | ICD-10-CM | POA: Diagnosis not present

## 2018-08-25 MED ORDER — PREDNISONE 50 MG PO TABS: 50.0000 mg | ORAL_TABLET | Freq: Every day | ORAL | 0 refills | Status: DC

## 2018-08-25 MED FILL — predniSONE 50 MG TABS: 50 | 5 days supply | Qty: 5 | Fill #0

## 2018-08-25 NOTE — Telephone Encounter (Signed)
Called patient send in prednisone to help with any inflammation that could have been secondary to the chiropractor.

## 2018-08-25 NOTE — Telephone Encounter (Signed)
Copied from [place] [id number]. Topic: Appointment Scheduling - Scheduling Inquiry for Clinic >> Aug 25, 2018  9:37 AM Sheran Luz wrote: Patient called inquiring if he can be worked in with Dr. Tamala Julian today, advised patient that he would be out of office- Patient then inquired if Dr. Thompson Caul nurse could return his call regarding some pain in his left arm. Patient states that he had chiropractor appointment yesterday and the pain started shortly after; chiropractor is closed today.

## 2018-08-26 NOTE — Telephone Encounter (Signed)
Pt states he is returning a call from Dr. Thompson Caul nurse.  States he can be reached at [phone]

## 2018-08-29 ENCOUNTER — Ambulatory Visit: Payer: 59 | Admitting: Pediatrics

## 2018-08-29 NOTE — Telephone Encounter (Signed)
Spoke with pt, he did pick up prednisone from pharmacy & he feels like it is helping.

## 2018-08-30 DIAGNOSIS — N529 Male erectile dysfunction, unspecified: Secondary | ICD-10-CM | POA: Diagnosis not present

## 2018-08-30 DIAGNOSIS — Z0189 Encounter for other specified special examinations: Secondary | ICD-10-CM | POA: Diagnosis not present

## 2018-08-30 DIAGNOSIS — Z1159 Encounter for screening for other viral diseases: Secondary | ICD-10-CM | POA: Diagnosis not present

## 2018-08-30 DIAGNOSIS — E291 Testicular hypofunction: Secondary | ICD-10-CM | POA: Diagnosis not present

## 2018-08-31 ENCOUNTER — Encounter: Payer: Self-pay | Admitting: Family Medicine

## 2018-09-02 ENCOUNTER — Other Ambulatory Visit: Payer: Self-pay | Admitting: Family Medicine

## 2018-09-02 MED FILL — GABAPENTIN 300 MG CAPSULE: 300 | 90 days supply | Qty: 90 | Fill #0

## 2018-09-02 NOTE — Telephone Encounter (Signed)
Refill done.  

## 2018-09-05 ENCOUNTER — Ambulatory Visit: Payer: 59 | Admitting: Pediatrics

## 2018-09-06 MED FILL — BD 3 ML SYRINGE WITH NEEDLE: 23G X 1" | 84 days supply | Qty: 6 | Fill #0

## 2018-09-06 MED FILL — TESTOSTERONE CYPIONATE 200: 200 | 84 days supply | Qty: 6 | Fill #0

## 2018-09-17 ENCOUNTER — Other Ambulatory Visit: Payer: Self-pay

## 2018-09-17 ENCOUNTER — Emergency Department
Admission: EM | Admit: 2018-09-17 | Discharge: 2018-09-17 | Disposition: A | Discharge status: Home / Self Care | Payer: 59 | Source: Home / Self Care | Attending: Family Medicine | Admitting: Family Medicine

## 2018-09-17 ENCOUNTER — Emergency Department (INDEPENDENT_AMBULATORY_CARE_PROVIDER_SITE_OTHER): Payer: 59

## 2018-09-17 DIAGNOSIS — J302 Other seasonal allergic rhinitis: Secondary | ICD-10-CM

## 2018-09-17 DIAGNOSIS — J069 Acute upper respiratory infection, unspecified: Secondary | ICD-10-CM | POA: Diagnosis not present

## 2018-09-17 DIAGNOSIS — B9789 Other viral agents as the cause of diseases classified elsewhere: Secondary | ICD-10-CM | POA: Diagnosis not present

## 2018-09-17 DIAGNOSIS — R05 Cough: Secondary | ICD-10-CM

## 2018-09-17 MED ORDER — PREDNISONE 20 MG PO TABS: ORAL_TABLET | ORAL | 0 refills | Status: DC

## 2018-09-17 MED ORDER — AMOXICILLIN 875 MG PO TABS: 875.0000 mg | ORAL_TABLET | Freq: Two times a day (BID) | ORAL | 0 refills | Status: DC

## 2018-09-17 NOTE — Discharge Instructions (Addendum)
Take plain guaifenesin (1200mg  extended release tabs such as Mucinex) twice daily, with plenty of water, for cough and congestion.  Get adequate rest.   May use Afrin nasal spray (or generic oxymetazoline) each morning for about 5 days and then discontinue.  Also recommend using saline nasal spray several times daily and saline nasal irrigation (AYR is a common brand).  Use Flonase nasal spray each morning after using Afrin nasal spray and saline nasal irrigation. Try warm salt water gargles for sore throat.  Stop all antihistamines (Claritin, etc) for now, and other non-prescription cough/cold preparations. May take Delsym Cough Suppressant at bedtime for nighttime cough.  Begin Amoxicillin if not improving about one week or if persistent fever develops.

## 2018-09-17 NOTE — ED Triage Notes (Signed)
Pt c/o sinus issues since Monday. Suffers from seasonal allergies. Productive cough, sinus pressure and feels like its settling into his lungs. Taking Flonase and loratadine prn.

## 2018-09-17 NOTE — ED Provider Notes (Signed)
[name]    CSN: [id number] Arrival date & time: 09/17/18  1011     History   Chief Complaint Chief Complaint  Patient presents with  . Facial Pain    Sinus infection    HPI Erik [name] is a 63 y.o. male.   Five days ago patient developed sinus congestion and sinus pressure, followed by a cough the next day.  He has seasonal allergic rhinitis but his symptoms have not improved with Flonase and Claritin.  He denies fevers, chills, and sweats, myalgias, and fatigue.  He has had a pressure-like sensation under his sternum and left chest.  The history is provided by the patient.    Past Medical History:  Diagnosis Date  . ALLERGIC RHINITIS 12/19/2009  . Allergy   . Back pain chronic    On hydrocodone-Neurontin  . Blepharitis    B, chronic  . BPH (benign prostatic hyperplasia)   . Chronic prostatitis     f/u by urology, has a prostate massage prn  . GERD (gastroesophageal reflux disease)   . Groin injury   . HYPOGONADISM, MALE 08/11/2007   f/u by Dr Thomasene Mohair urology    . OSTEOPOROSIS, IDIOPATHIC 08/11/2007   f/u at [facility] , now improved to osteopenia  . Raynaud's disease /phenomenon 08/16/2012  . Rosacea   . Shingles 09-2014  . Varicose veins     Patient Active Problem List   Diagnosis Date Noted  . Early stage glaucoma 07/26/2018  . Enlarged prostate 07/26/2018  . Raynaud's disease without gangrene 10/08/2017  . 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 Lumbar radiculopathy 03/25/2017  . Degenerative cervical disc 06/08/2016  . Degenerative lumbar disc 06/08/2016  . PCP NOTES >>> 04/29/2015  . Binocular vision disorder with diplopia 10/01/2014  . 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 Herpes zoster-- 09-2014 09/05/2014  . Anal fissure 02/28/2014  . Chronic prostatitis 02/28/2014  . Abnormal prostate specific antigen 02/28/2014  . Gastroesophageal reflux disease without esophagitis  02/28/2014  . Ganglion of tendon 02/28/2014  . Incomplete bladder emptying 02/28/2014  . FOM (frequency of micturition) 02/28/2014  . Nonexudative age-related macular degeneration 02/12/2014  . 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 Annual physical exam 10/21/2011  . Neck -- back pain- UDS 09/18/2011  . Other allergic rhinitis 12/19/2009  . PELVIC PAIN, CHRONIC 04/03/2009  . Hypogonadism -- per urology 08/11/2007  . OSTEOPOROSIS, IDIOPATHIC 08/11/2007  . HEADACHE 08/11/2007    Past Surgical History:  Procedure Laterality Date  . BLEPHAROPLASTY     B blepharoplasty @ [facility] 08-2011  . CERVICAL DISCECTOMY  1998  . CERVICAL FUSION  1994   C4-C5-C6 fusion  . COLONOSCOPY    . EYE SURGERY     eyelid surgery on both eyes  . HERNIA REPAIR Left 1979  . IR GENERIC HISTORICAL  03/17/2016   IR EMBO VENOUS NOT HEMORR HEMANG  INC GUIDE ROADMAPPING 03/17/2016 Greggory Keen, MD [facility] ULTRASOUND  . KNEE ARTHROSCOPY    . POLYPECTOMY    . SHOULDER SURGERY Right    early 2000s  . SIGMOIDOSCOPY    . TRANSURETHRAL RESECTION OF PROSTATE  07-2009       Home Medications    Prior to Admission medications   Medication Sig Start Date End Date Taking? Authorizing Provider  amLODipine (NORVASC) 2.5 MG tablet Take 1 tablet (2.5 mg total) by mouth daily. 10/20/17   Bo Merino, MD  amoxicillin (AMOXIL) 875  MG tablet Take 1 tablet (875 mg total) by mouth 2 (two) times daily. (Rx void after 09/25/18) 09/17/18   Kandra Nicolas, MD  azelastine (ASTELIN) 0.1 % nasal spray Place 2 sprays into both nostrils at bedtime as needed for rhinitis. Use in each nostril as directed 06/09/17   Colon Branch, MD  cromolyn (NASALCROM) 5.2 MG/ACT nasal spray Place 1 spray into both nostrils 4 (four) times daily. 06/10/18   Colon Branch, MD  gabapentin (NEURONTIN) 300 MG capsule Take 1 capsule (300 mg total) by mouth at bedtime. 09/02/18   Lyndal Pulley, DO  ipratropium (ATROVENT) 0.03 % nasal spray Use 2 sprays  per nostril 20 minutes before eating. May use 2 sprays per nostril 3 times a day. 07/26/18   Charlies Silvers, MD  lidocaine (XYLOCAINE) 5 % ointment Apply 1 application topically as needed. 09/20/17   Levin Erp, PA  loratadine (CLARITIN) 10 MG tablet Take 10 mg by mouth daily as needed for allergies.    [provider]  Multiple Vitamin (MULTI-VITAMINS) TABS Take by mouth.    [provider]  Omega-3 Fatty Acids (FISH OIL) 1000 MG CAPS Take 1 capsule by mouth 2 (two) times daily.    [provider]  omeprazole (PRILOSEC) 40 MG capsule Use 1 capsule once or twice a day for heartburn and reflux 07/26/18   Charlies Silvers, MD  OVER THE COUNTER MEDICATION Take 1 tablet by mouth 2 (two) times daily. AREDS 2    [provider]  predniSONE (DELTASONE) 20 MG tablet Take one tab by mouth twice daily for 4 days, then one daily. Take with food. 09/17/18   Kandra Nicolas, MD  sildenafil (REVATIO) 20 MG tablet TAKE 1 TABLET (20 MG TOTAL) BY MOUTH AS NEEDED FOR UP TO 30 DAYS. 06/27/18   [provider]  tadalafil (CIALIS) 5 MG tablet Take 5 mg by mouth daily.    [provider]  testosterone cypionate (DEPOTESTOTERONE CYPIONATE) 100 MG/ML injection Inject into the muscle every 14 (fourteen) days. For IM use only    [provider]    Family History Family History  Problem Relation Age of Onset  . COPD Father        Died, 71  . Hypertension Father   . Stroke Father   . CAD Father   . Allergic rhinitis Father   . Diabetes Mother   . Hypertension Mother   . CAD Mother           . Colon polyps Mother   . Cancer Mother        Ovarian cancer  . Hypertension Sister   . High Cholesterol Sister   . Colon cancer Maternal Aunt   . Prostate cancer Neg Hx   . Esophageal cancer Neg Hx   . Rectal cancer Neg Hx   . Stomach cancer Neg Hx   . Pancreatic cancer Neg Hx   . Angioedema Neg Hx   . Asthma Neg Hx   . Eczema Neg Hx   .  Immunodeficiency Neg Hx   . Urticaria Neg Hx     Social History Social History   Tobacco Use  . Smoking status: Never Smoker  . Smokeless tobacco: Never Used  Substance Use Topics  . Alcohol use: No    Alcohol/week: 0.0 standard drinks  . Drug use: No     Allergies   Sulfa drugs cross reactors   Review of Systems Review of Systems  No sore throat + cough No pleuritic pain, but has tightness in anterior left chest No wheezing + nasal congestion + post-nasal drainage + sinus pain/pressure No itchy/red eyes No earache No hemoptysis No SOB No fever/chills No nausea No vomiting No abdominal pain No diarrhea No urinary symptoms No skin rash + fatigue No myalgias No headache Used OTC meds without relief   Physical Exam Triage Vital Signs ED Triage Vitals [09/17/18 1138]  Enc Vitals Group     BP 128/79     Pulse Rate 71     Resp 18     Temp 98.3 F (36.8 C)     Temp Source Oral     SpO2 100 %     Weight 203 lb (92.1 kg)     Height 6' (1.829 m)     Head Circumference      Peak Flow      Pain Score 0     Pain Loc      Pain Edu?      Excl. in [place]?    No data found.  Updated Vital Signs BP 128/79 (BP Location: Right Arm)   Pulse 71   Temp 98.3 F (36.8 C) (Oral)   Resp 18   Ht 6' (1.829 m)   Wt 92.1 kg   SpO2 100%   BMI 27.53 kg/m   Visual Acuity Right Eye Distance:   Left Eye Distance:   Bilateral Distance:    Right Eye Near:   Left Eye Near:    Bilateral Near:     Physical Exam Chest:     Nursing notes and Vital Signs reviewed. Appearance:  Patient appears stated age, and in no acute distress Eyes:  Pupils are equal, round, and reactive to light and accomodation.  Extraocular movement is intact.  Conjunctivae are not inflamed  Ears:  Canals normal.  Tympanic membranes normal.  Nose:  Mildly congested turbinates.  No sinus tenderness.  Mild frontal sinus tenderness is present.  Pharynx:  Normal Neck:  Supple.  Enlarged  posterior/lateral nodes are palpated bilaterally, tender to palpation on the left.   Lungs:  Clear to auscultation.  Breath sounds are equal.  Moving air well.  There is anterior sternum and left chest tenderness to palpation as noted on diagram. Heart:  Regular rate and rhythm without murmurs, rubs, or gallops.  Abdomen:  Nontender without masses or hepatosplenomegaly.  Bowel sounds are present.  No CVA or flank tenderness.  Extremities:  No edema.  Skin:  No rash present.     UC Treatments / Results  Labs (all labs ordered are listed, but only abnormal results are displayed) Labs Reviewed - No data to display  EKG None  Radiology Dg Chest 2 View  Result Date: 09/17/2018 CLINICAL DATA:  Cough and congestion EXAM: CHEST - 2 VIEW COMPARISON:  Nov 08, 2014. FINDINGS: There is no edema or consolidation. Heart size and pulmonary vascularity are normal. No adenopathy. No pneumothorax. No bone lesions. IMPRESSION: No edema or consolidation. Electronically Signed   By: Lowella Grip III M.D.   On: 09/17/2018 13:07    Procedures Procedures (including critical care time)  Medications Ordered in UC Medications - No data to display  Initial Impression / Assessment and Plan / UC Course  I have reviewed the triage vital signs and the nursing notes.  Pertinent labs & imaging results that were available during my care of the patient were reviewed by me and considered in my medical decision making (see  chart for details).    There is no evidence of bacterial infection today.  Treat symptomatically for now  Begin prednisone burst/taper. Followup with Family Doctor if not improved in about 10 to 14 days.   Final Clinical Impressions(s) / UC Diagnoses   Final diagnoses:  Viral URI with cough  Seasonal allergic rhinitis, unspecified trigger     Discharge Instructions     Take plain guaifenesin (1200mg  extended release tabs such as Mucinex) twice daily, with plenty of water, for cough  and congestion.  Get adequate rest.   May use Afrin nasal spray (or generic oxymetazoline) each morning for about 5 days and then discontinue.  Also recommend using saline nasal spray several times daily and saline nasal irrigation (AYR is a common brand).  Use Flonase nasal spray each morning after using Afrin nasal spray and saline nasal irrigation. Try warm salt water gargles for sore throat.  Stop all antihistamines (Claritin, etc) for now, and other non-prescription cough/cold preparations. May take Delsym Cough Suppressant at bedtime for nighttime cough.  Begin Amoxicillin if not improving about one week or if persistent fever develops.(Given a prescription to hold, with an expiration date)        ED Prescriptions    Medication Sig Dispense Auth. Provider   predniSONE (DELTASONE) 20 MG tablet Take one tab by mouth twice daily for 4 days, then one daily. Take with food. 12 tablet Kandra Nicolas, MD   amoxicillin (AMOXIL) 875 MG tablet Take 1 tablet (875 mg total) by mouth 2 (two) times daily. (Rx void after 09/25/18) 20 tablet Kandra Nicolas, MD        Kandra Nicolas, MD 09/20/18 [phone]

## 2018-09-26 ENCOUNTER — Encounter: Payer: Self-pay | Admitting: Internal Medicine

## 2018-10-03 DIAGNOSIS — E291 Testicular hypofunction: Secondary | ICD-10-CM | POA: Diagnosis not present

## 2018-10-06 DIAGNOSIS — N411 Chronic prostatitis: Secondary | ICD-10-CM | POA: Diagnosis not present

## 2018-10-07 ENCOUNTER — Other Ambulatory Visit: Payer: Self-pay

## 2018-10-07 ENCOUNTER — Ambulatory Visit (INDEPENDENT_AMBULATORY_CARE_PROVIDER_SITE_OTHER): Payer: 59 | Admitting: Internal Medicine

## 2018-10-07 ENCOUNTER — Encounter: Payer: Self-pay | Admitting: Internal Medicine

## 2018-10-07 ENCOUNTER — Ambulatory Visit: Payer: Self-pay | Admitting: *Deleted

## 2018-10-07 DIAGNOSIS — R079 Chest pain, unspecified: Secondary | ICD-10-CM | POA: Diagnosis not present

## 2018-10-07 NOTE — Telephone Encounter (Signed)
Please advise 

## 2018-10-07 NOTE — Progress Notes (Signed)
Subjective:    Patient ID: Erik [name], male    DOB: [birth year], 63 y.o.   MRN: [id number]  DOS:  10/07/2018 Type of visit - description: Virtual Visit via Video Note  I connected with@ on 10/10/18 at 11:20 AM EDT by a video enabled telemedicine application and verified that I am speaking with the correct person using two identifiers.   THIS ENCOUNTER IS A VIRTUAL VISIT DUE TO COVID-19 - PATIENT WAS NOT SEEN IN THE OFFICE. PATIENT HAS CONSENTED TO VIRTUAL VISIT / TELEMEDICINE VISIT   Location of patient: home  Location of provider: office  I discussed the limitations of evaluation and management by telemedicine and the availability of in person appointments. The patient expressed understanding and agreed to proceed.  History of Present Illness: Acute visit Symptoms started 2 or 3 months ago: Has constant discomfort/"sensation" at the left side of the chest. Not associated with nausea or sweats. No worse with exertion No worse by moving his arms or torso.  Went to our urgent care in [facility], a chest x-ray was done, no pneumonia, he was Rx empirically amoxicillin and prednisone which he already finished.  Also, had a lot of cough, went to see his allergist, symptoms felt to be due to acid reflux and he was prescribed PPIs.  He did go to [place] in early March but as noted above, symptoms preceded this trip.  No other PE risk factors.   Review of Systems Denies fever chills No difficulty breathing. No edema in the lower extremities, no palpitation. No nausea vomiting No chest rash No calves pain or swelling  Past Medical History:  Diagnosis Date  . ALLERGIC RHINITIS 12/19/2009  . Allergy   . Back pain chronic    On hydrocodone-Neurontin  . Blepharitis    B, chronic  . BPH (benign prostatic hyperplasia)   . Chronic prostatitis     f/u by urology, has a prostate massage prn  . GERD (gastroesophageal reflux disease)   . Groin injury   .  HYPOGONADISM, MALE 08/11/2007   f/u by Dr Thomasene Mohair urology    . OSTEOPOROSIS, IDIOPATHIC 08/11/2007   f/u at [facility] , now improved to osteopenia  . Raynaud's disease /phenomenon 08/16/2012  . Rosacea   . Shingles 09-2014  . Varicose veins     Past Surgical History:  Procedure Laterality Date  . BLEPHAROPLASTY     B blepharoplasty @ [facility] 08-2011  . CERVICAL DISCECTOMY  1998  . CERVICAL FUSION  1994   C4-C5-C6 fusion  . COLONOSCOPY    . EYE SURGERY     eyelid surgery on both eyes  . HERNIA REPAIR Left 1979  . IR GENERIC HISTORICAL  03/17/2016   IR EMBO VENOUS NOT HEMORR HEMANG  INC GUIDE ROADMAPPING 03/17/2016 Greggory Keen, MD [facility] ULTRASOUND  . KNEE ARTHROSCOPY    . POLYPECTOMY    . SHOULDER SURGERY Right    early 2000s  . SIGMOIDOSCOPY    . TRANSURETHRAL RESECTION OF PROSTATE  07-2009    Social History   Socioeconomic History  . Marital status: Married    Spouse name: Not on file  . Number of children: 3  . Years of education: Not on file  . Highest education level: Not on file  Occupational History  . Occupation: [employer]   Social Needs  . Financial resource strain: Not on file  . Food insecurity:    Worry: Not on file    Inability: Not on file  .  Transportation needs:    Medical: Not on file    Non-medical: Not on file  Tobacco Use  . Smoking status: Never Smoker  . Smokeless tobacco: Never Used  Substance and Sexual Activity  . Alcohol use: No    Alcohol/week: 0.0 standard drinks  . Drug use: No  . Sexual activity: Not on file  Lifestyle  . Physical activity:    Days per week: Not on file    Minutes per session: Not on file  . Stress: Not on file  Relationships  . Social connections:    Talks on phone: Not on file    Gets together: Not on file    Attends religious service: Not on file    Active member of club or organization: Not on file    Attends meetings of clubs or organizations: Not on file    Relationship status: Not on file  . Intimate partner  violence:    Fear of current or ex partner: Not on file    Emotionally abused: Not on file    Physically abused: Not on file    Forced sexual activity: Not on file  Other Topics Concern  . Not on file  Social History Narrative   Lives with wife.  They have three children.   Daughter is an Ship broker        Allergies as of 10/07/2018      Reactions   Sulfa Drugs Cross Reactors Nausea And Vomiting      Medication List       Accurate as of October 07, 2018 11:59 PM. Always use your most recent med list.        amLODipine 2.5 MG tablet Commonly known as:  Norvasc Take 1 tablet (2.5 mg total) by mouth daily.   azelastine 0.1 % nasal spray Commonly known as:  ASTELIN Place 2 sprays into both nostrils at bedtime as needed for rhinitis. Use in each nostril as directed   cromolyn 5.2 MG/ACT nasal spray Commonly known as:  NASALCROM Place 1 spray into both nostrils 4 (four) times daily.   Fish Oil 1000 MG Caps Take 1 capsule by mouth 2 (two) times daily.   gabapentin 300 MG capsule Commonly known as:  NEURONTIN Take 1 capsule (300 mg total) by mouth at bedtime.   ipratropium 0.03 % nasal spray Commonly known as:  ATROVENT Use 2 sprays per nostril 20 minutes before eating. May use 2 sprays per nostril 3 times a day.   lidocaine 5 % ointment Commonly known as:  XYLOCAINE Apply 1 application topically as needed.   loratadine 10 MG tablet Commonly known as:  CLARITIN Take 10 mg by mouth daily as needed for allergies.   Multi-Vitamins Tabs Take by mouth.   omeprazole 40 MG capsule Commonly known as:  PRILOSEC Use 1 capsule once or twice a day for heartburn and reflux   OVER THE COUNTER MEDICATION Take 1 tablet by mouth 2 (two) times daily. AREDS 2   sildenafil 20 MG tablet Commonly known as:  REVATIO TAKE 1 TABLET (20 MG TOTAL) BY MOUTH AS NEEDED FOR UP TO 30 DAYS.   tadalafil 5 MG tablet Commonly known as:  CIALIS Take 5 mg by mouth daily.   testosterone  cypionate 100 MG/ML injection Commonly known as:  DEPOTESTOTERONE CYPIONATE Inject into the muscle every 14 (fourteen) days. For IM use only           Objective:   Physical Exam There were no vitals taken for  this visit. This is a video conference, he was alert and oriented 3 times, in no distress, he was able to point to the left chest, the area were he is having this discomfort, the area was a slightly tender to self palpation.    Assessment      Assessment   Osteoporosis, idiopathic, follow[facility]up at [facility]. Raynaud phenomena 2013--Dr Truslow, amlodipine prn. Now sees another rheumatologist [facility] -- s/p Cervical fusion --Chronic back pain: used to be on Hydrocodone rx by pcp;  on gabapentin ) -- L shoulder adhesive capsulitis -- stress fracture L sesamoid (foot) dx 2018) GU: Dr. Thomasene Mohair, urology --BPH, TURP 2011 --Chronic prostatitis --Hypogonadism, per urology Rosacea Varicose veins treatment @ [facility] OPHT: Glaucoma -Chronic blepharitis- ocular rosacea- Mac degeneration H/o Shingles 09-2014   PLAN Video visit  Chest pain: Quite atypical, going on for 2 or 3 months, he did went to [place] 3 weeks ago but no other PE risk factors.  Given essentially negative review of symptoms, is unlikely that this pain represent something serious like CAD or PE. Chest x-ray at the urgent care did not show pneumonia. Based on this information and understanding the imitations of a video visit, we agreed on observation for now. He will monitor his symptoms, call in 2 weeks if no better, okay to take OTC Tylenol or ibuprofen, call or go to the ER if severe or different chest pain.  We will send a message with my advise.  Today, I spent more than 25   min with the patient: >50% of the time counseling regards CP, listening to his detailed description of the pain as well as the timeframe of symptoms and recent trips. I also reviewed the chart, discussed with the patient why I am not considering serious  underlying condition. Multiple questions answered.  I discussed the assessment and treatment plan with the patient. The patient was provided an opportunity to ask questions and all were answered. The patient agreed with the plan and demonstrated an understanding of the instructions.   The patient was advised to call back or seek an in-person evaluation if the symptoms worsen or if the condition fails to improve as anticipated.

## 2018-10-07 NOTE — Telephone Encounter (Signed)
Arrange a video visit please as early as possible today

## 2018-10-07 NOTE — Telephone Encounter (Signed)
Pt called stating that he is having chest pain and constant pinching for several weeks; he states that it is under the nipple area on the left chest; he was given antibiotics on 09/17/2018 and just finished that regimen;the pt says that he had a chest xray at that; the pt states that he is having the same level of discomfort;  BP 116/75 on 10/06/2018; the pt says that he did go to [place] 09/09/2018; recommendations made per nurse triage protocol; the pt normally sees Dr Larose Kells, [facility], and is concerned about his ongoing  discomfort; he would like to see Dr Larose Kells, and requests a virtual visit; his email is  [email or web address]; the pt can also be contacted at [phone], and a message can be left on voicemail; will route to office for final dispostion.  Reason for Disposition . [1] MODERATE longstanding difficulty breathing (e.g., speaks in phrases, SOB even at rest, pulse 100-120) AND [2] SAME as normal  Answer Assessment - Initial Assessment Questions 1. RESPIRATORY STATUS: "Describe your breathing?" (e.g., wheezing, shortness of breath, unable to speak, severe coughing)      Pinching sensation; like someone pushing on chest 2. ONSET: "When did this breathing problem begin?"     09/17/2018 3. PATTERN "Does the difficult breathing come and go, or has it been constant since it started?"      constant 4. SEVERITY: "How bad is your breathing?" (e.g., mild, moderate, severe)    - MILD: No SOB at rest, mild SOB with walking, speaks normally in sentences, can lay down, no retractions, pulse < 100.    - MODERATE: SOB at rest, SOB with minimal exertion and prefers to sit, cannot lie down flat, speaks in phrases, mild retractions, audible wheezing, pulse 100-120.    - SEVERE: Very SOB at rest, speaks in single words, struggling to breathe, sitting hunched forward, retractions, pulse > 120      moderate 5. RECURRENT SYMPTOM: "Have you had difficulty breathing before?" If so, ask: "When was the last time?"  and "What happened that time?"    Ongoing since 09/17/2018 but he is still coughing 6. CARDIAC HISTORY: "Do you have any history of heart disease?" (e.g., heart attack, angina, bypass surgery, angioplasty)      no 7. LUNG HISTORY: "Do you have any history of lung disease?"  (e.g., pulmonary embolus, asthma, emphysema)    History of pneumonia 8. CAUSE: "What do you think is causing the breathing problem?"      Maybe coughing a lot, ?stress 9. OTHER SYMPTOMS: "Do you have any other symptoms? (e.g., dizziness, runny nose, cough, chest pain, fever)     Cough, runny nose due to allergies 10. PREGNANCY: "Is there any chance you are pregnant?" "When was your last menstrual period?"       n/a 11. TRAVEL: "Have you traveled out of the country in the last month?" (e.g., travel history, exposures)       no  Protocols used: BREATHING DIFFICULTY-A-AH

## 2018-10-07 NOTE — Telephone Encounter (Signed)
Visit scheduled °

## 2018-10-10 ENCOUNTER — Encounter: Payer: Self-pay | Admitting: Allergy & Immunology

## 2018-10-10 ENCOUNTER — Telehealth: Payer: Self-pay

## 2018-10-10 ENCOUNTER — Ambulatory Visit: Payer: 59 | Admitting: Allergy & Immunology

## 2018-10-10 NOTE — Assessment & Plan Note (Signed)
Video visit  Chest pain: Quite atypical, going on for 2 or 3 months, he did went to [place] 3 weeks ago but no other PE risk factors.  Given essentially negative review of symptoms, is unlikely that this pain represent something serious like CAD or PE. Chest x-ray at the urgent care did not show pneumonia. Based on this information and understanding the imitations of a video visit, we agreed on observation for now. He will monitor his symptoms, call in 2 weeks if no better, okay to take OTC Tylenol or ibuprofen, call or go to the ER if severe or different chest pain.  We will send a message with my advise.

## 2018-10-10 NOTE — Telephone Encounter (Signed)
I am sorry to cancel today's appointment. I called the office but only got the voicemail.  I had to take my wife to the doctor unexpectedly for what we suspect is vertigo.  Thanks     appt has been canceled via [place] [facility].

## 2018-10-12 MED FILL — OMEPRAZOLE 40 MG CPDR: 40 | 30 days supply | Qty: 60 | Fill #1

## 2018-10-12 NOTE — Progress Notes (Addendum)
Virtual Visit via Video Note  I connected with Erik [name] on 10/12/18 at  9:15 AM EDT by a video enabled telemedicine application and verified that I am speaking with the correct person using two identifiers.   I discussed the limitations of evaluation and management by telemedicine and the availability of in person appointments. The patient expressed understanding and agreed to proceed.  CC: Raynaud's   History of Present Illness: Patient is a 63 year old male with a past medical history of raynaud's syndrome, DDD, and osteoporosis. He continues to have interimittent symptoms of Raynaud's.  He reports his symptoms are temperature dependent.  He states he has very mild symptoms in warmer weather.  He has no digital ulcerations or signs of gangrene. He takes Norvasc 2.5 mg po PRN for management of Raynaud's.  If he develops symptoms of raynaud's he takes Norvasc for several consective days, which resolve his symptoms.   He has chronic eye dryness and is treated by his optometrist.   He denies any joint pain or joint swelling at this time.  He denies any other features of autoimmune disease at this time.     Review of Systems  Constitutional: Negative for fever and malaise/fatigue.  HENT:       Denies mouth dryness  Eyes: Negative for photophobia, pain, discharge and redness.       +eye dryness  Respiratory: Negative for cough, shortness of breath and wheezing.   Cardiovascular: Negative for chest pain and palpitations.  Gastrointestinal: Negative for blood in stool, constipation and diarrhea.  Genitourinary: Negative for dysuria.  Musculoskeletal: Negative for back pain, joint pain, myalgias and neck pain.  Skin: Negative for rash.       +Raynaud's intermittently   Neurological: Negative for dizziness and headaches.  Psychiatric/Behavioral: Negative for depression. The patient is not nervous/anxious and does not have insomnia.    Observations/Objective: Physical Exam   Constitutional: He is oriented to person, place, and time and well-developed, well-nourished, and in no distress.  HENT:  Head: Normocephalic and atraumatic.  Eyes: Conjunctivae are normal.  Pulmonary/Chest: Effort normal.  Neurological: He is alert and oriented to person, place, and time.  Psychiatric: Mood, memory, affect and judgment normal.   Patient reports morning stiffness for 0 minutes.   Patient denies nocturnal pain.  Difficulty dressing/grooming: Denies Difficulty climbing stairs: Denies Difficulty getting out of chair: Denies Difficulty using hands for taps, buttons, cutlery, and/or writing: Denies  Assessment and Plan: Raynaud's disease without gangrene - All autoimmune labs are negative, p-ANCA 1: 20 which is nonspecific.  He has intermittent symptoms of Raynaud's, which seems to be weather dependent.  He has no digital ulcerations or signs of gangrene.  He takes Norvasc 2.5 mg po PRN.  If he develops signs or symptoms of Raynaud's he takes Norvasc for several days and his symptoms resolve.  His symptoms have been mild and manageable with warmer weather.  He does not need a refill of Norvasc at his time.  He was advised to notify us if he develops any signs of ulcerations or gangrene.  He has not other features of autoimmune disease at this time.  He will also notify us if he develops any new or worsening symptoms.   Degenerative cervical disc - Status post C4-5 and C5-6 fusion.  He has limited range of motion.  He has no discomfort at this time.  He has no symptoms of radiculopathy.   Degenerative lumbar disc - Status post L4-5 discectomy 1998.  He has no discomfort currently.  He has occasional lower back pain and stiffness.   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   Vitamin D deficiency-He had been taking vitamin D 1,000 units daily.  Age-related  osteoporosis without current pathological fracture -  Diagnosed in 1998 and treated with testosterone, vitamin D and bisphosphonates.  DXA osteopenia range per patient.   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   I discussed the assessment and treatment plan with the patient. The patient was provided an opportunity to ask questions and all were answered. The patient agreed with the plan and demonstrated an understanding of the instructions.   The patient was advised to call back or seek an in-person evaluation if the symptoms worsen or if the condition fails to improve as anticipated.  I provided 22 minutes of non-face-to-face time during this encounter. Bo Merino, MD   Scribed by-  Ofilia Neas, PA-C

## 2018-10-17 ENCOUNTER — Encounter: Payer: Self-pay | Admitting: Family Medicine

## 2018-10-18 ENCOUNTER — Encounter: Payer: Self-pay | Admitting: Family Medicine

## 2018-10-18 ENCOUNTER — Ambulatory Visit: Payer: 59 | Admitting: Family Medicine

## 2018-10-18 NOTE — Progress Notes (Deleted)
[name] [name], [name] [address] Phone: [phone] Subjective:    I'm seeing this patient by the request  of:    CC:   Erik [name]  Erik [name] is a 63 y.o. male coming in with complaint of ***  Onset-  Location Duration-  Character- Aggravating factors- Reliving factors-  Therapies tried-  Severity-   Patient's x-rays taken August 01, 2018 showed the patient did have moderate to space narrowing at L2-L3 and severe narrowing at L3-L4.  Independently visualized by me  Past Medical History:  Diagnosis Date  . ALLERGIC RHINITIS 12/19/2009  . Allergy   . Back pain chronic    On hydrocodone-Neurontin  . Blepharitis    B, chronic  . BPH (benign prostatic hyperplasia)   . Chronic prostatitis     f/u by urology, has a prostate massage prn  . GERD (gastroesophageal reflux disease)   . Groin injury   . HYPOGONADISM, MALE 08/11/2007   f/u by Dr Thomasene Mohair urology    . OSTEOPOROSIS, IDIOPATHIC 08/11/2007   f/u at [facility] , now improved to osteopenia  . Raynaud's disease /phenomenon 08/16/2012  . Rosacea   . Shingles 09-2014  . Varicose veins    Past Surgical History:  Procedure Laterality Date  . BLEPHAROPLASTY     B blepharoplasty @ [facility] 08-2011  . CERVICAL DISCECTOMY  1998  . CERVICAL FUSION  1994   C4-C5-C6 fusion  . COLONOSCOPY    . EYE SURGERY     eyelid surgery on both eyes  . HERNIA REPAIR Left 1979  . IR GENERIC HISTORICAL  03/17/2016   IR EMBO VENOUS NOT HEMORR HEMANG  INC GUIDE ROADMAPPING 03/17/2016 Greggory Keen, MD [facility] ULTRASOUND  . KNEE ARTHROSCOPY    . POLYPECTOMY    . SHOULDER SURGERY Right    early 2000s  . SIGMOIDOSCOPY    . TRANSURETHRAL RESECTION OF PROSTATE  07-2009   Social History   Socioeconomic History  . Marital status: Married    Spouse name: Not on file  . Number of children: 3  . Years of education: Not on file  . Highest education level: Not on file  Occupational History  .  Occupation: [employer]   Social Needs  . Financial resource strain: Not on file  . Food insecurity:    Worry: Not on file    Inability: Not on file  . Transportation needs:    Medical: Not on file    Non-medical: Not on file  Tobacco Use  . Smoking status: Never Smoker  . Smokeless tobacco: Never Used  Substance and Sexual Activity  . Alcohol use: No    Alcohol/week: 0.0 standard drinks  . Drug use: No  . Sexual activity: Not on file  Lifestyle  . Physical activity:    Days per week: Not on file    Minutes per session: Not on file  . Stress: Not on file  Relationships  . Social connections:    Talks on phone: Not on file    Gets together: Not on file    Attends religious service: Not on file    Active member of club or organization: Not on file    Attends meetings of clubs or organizations: Not on file    Relationship status: Not on file  Other Topics Concern  . Not on file  Social History Narrative   Lives with wife.  They have three children.   Daughter is an Ship broker  Allergies  Allergen Reactions  . Sulfa Drugs Cross Reactors Nausea And Vomiting   Family History  Problem Relation Age of Onset  . COPD Father        Died, 71  . Hypertension Father   . Stroke Father   . CAD Father   . Allergic rhinitis Father   . Diabetes Mother   . Hypertension Mother   . CAD Mother           . Colon polyps Mother   . Cancer Mother        Ovarian cancer  . Hypertension Sister   . High Cholesterol Sister   . Colon cancer Maternal Aunt   . Prostate cancer Neg Hx   . Esophageal cancer Neg Hx   . Rectal cancer Neg Hx   . Stomach cancer Neg Hx   . Pancreatic cancer Neg Hx   . Angioedema Neg Hx   . Asthma Neg Hx   . Eczema Neg Hx   . Immunodeficiency Neg Hx   . Urticaria Neg Hx     Current Outpatient Medications (Endocrine & Metabolic):  .  testosterone cypionate (DEPOTESTOTERONE CYPIONATE) 100 MG/ML injection, Inject into the muscle every 14 (fourteen) days.  For IM use only  Current Outpatient Medications (Cardiovascular):  .  amLODipine (NORVASC) 2.5 MG tablet, Take 1 tablet (2.5 mg total) by mouth daily. .  sildenafil (REVATIO) 20 MG tablet, TAKE 1 TABLET (20 MG TOTAL) BY MOUTH AS NEEDED FOR UP TO 30 DAYS. Marland Kitchen  tadalafil (CIALIS) 5 MG tablet, Take 5 mg by mouth daily.  Current Outpatient Medications (Respiratory):  .  azelastine (ASTELIN) 0.1 % nasal spray, Place 2 sprays into both nostrils at bedtime as needed for rhinitis. Use in each nostril as directed .  cromolyn (NASALCROM) 5.2 MG/ACT nasal spray, Place 1 spray into both nostrils 4 (four) times daily. Marland Kitchen  ipratropium (ATROVENT) 0.03 % nasal spray, Use 2 sprays per nostril 20 minutes before eating. May use 2 sprays per nostril 3 times a day. .  loratadine (CLARITIN) 10 MG tablet, Take 10 mg by mouth daily as needed for allergies.    Current Outpatient Medications (Other):  .  gabapentin (NEURONTIN) 300 MG capsule, Take 1 capsule (300 mg total) by mouth at bedtime. .  lidocaine (XYLOCAINE) 5 % ointment, Apply 1 application topically as needed. .  Multiple Vitamin (MULTI-VITAMINS) TABS, Take by mouth. .  Omega-3 Fatty Acids (FISH OIL) 1000 MG CAPS, Take 1 capsule by mouth 2 (two) times daily. Marland Kitchen  omeprazole (PRILOSEC) 40 MG capsule, Use 1 capsule once or twice a day for heartburn and reflux .  OVER THE COUNTER MEDICATION, Take 1 tablet by mouth 2 (two) times daily. AREDS 2    Past medical history, social, surgical and family history all reviewed in electronic medical record.  No pertanent information unless stated regarding to the chief complaint.   Review of Systems:  No headache, visual changes, nausea, vomiting, diarrhea, constipation, dizziness, abdominal pain, skin rash, fevers, chills, night sweats, weight loss, swollen lymph nodes, body aches, joint swelling, muscle aches, chest pain, shortness of breath, mood changes.   Objective  There were no vitals taken for this visit.  Systems examined below as of    General: No apparent distress alert and oriented x3 mood and affect normal, dressed appropriately.  HEENT: Pupils equal, extraocular movements intact  Respiratory: Patient's speak in full sentences and does not appear short of breath  Cardiovascular: No lower extremity edema, non tender,  no erythema  Skin: Warm dry intact with no signs of infection or rash on extremities or on axial skeleton.  Abdomen: Soft nontender  Neuro: Cranial nerves II through XII are intact, neurovascularly intact in all extremities with 2+ DTRs and 2+ pulses.  Lymph: No lymphadenopathy of posterior or anterior cervical chain or axillae bilaterally.  Gait normal with good balance and coordination.  MSK:  Non tender with full range of motion and good stability and symmetric strength and tone of shoulders, elbows, wrist, hip, knee and ankles bilaterally.     Impression and Recommendations:     This case required medical decision making of moderate complexity. The above documentation has been reviewed and is accurate and complete Lyndal Pulley, DO       Note: This dictation was prepared with Dragon dictation along with smaller phrase technology. Any transcriptional errors that result from this process are unintentional.

## 2018-10-24 ENCOUNTER — Telehealth (INDEPENDENT_AMBULATORY_CARE_PROVIDER_SITE_OTHER): Payer: 59 | Admitting: Rheumatology

## 2018-10-24 ENCOUNTER — Encounter: Payer: Self-pay | Admitting: Rheumatology

## 2018-10-24 ENCOUNTER — Ambulatory Visit: Payer: 59 | Admitting: Rheumatology

## 2018-10-24 DIAGNOSIS — M5136 Other intervertebral disc degeneration, lumbar region: Secondary | ICD-10-CM

## 2018-10-24 DIAGNOSIS — H40023 Open angle with borderline findings, high risk, bilateral: Secondary | ICD-10-CM

## 2018-10-24 DIAGNOSIS — I7300 Raynaud's syndrome without gangrene: Secondary | ICD-10-CM

## 2018-10-24 DIAGNOSIS — G5731 Lesion of lateral popliteal nerve, right lower limb: Secondary | ICD-10-CM

## 2018-10-24 DIAGNOSIS — M7502 Adhesive capsulitis of left shoulder: Secondary | ICD-10-CM

## 2018-10-24 DIAGNOSIS — M810 Age-related osteoporosis without current pathological fracture: Secondary | ICD-10-CM

## 2018-10-24 DIAGNOSIS — M5030 Other cervical disc degeneration, unspecified cervical region: Secondary | ICD-10-CM

## 2018-10-24 DIAGNOSIS — E559 Vitamin D deficiency, unspecified: Secondary | ICD-10-CM

## 2018-10-24 DIAGNOSIS — H353190 Nonexudative age-related macular degeneration, unspecified eye, stage unspecified: Secondary | ICD-10-CM

## 2018-10-24 DIAGNOSIS — Z872 Personal history of diseases of the skin and subcutaneous tissue: Secondary | ICD-10-CM

## 2018-10-25 ENCOUNTER — Telehealth: Payer: Self-pay | Admitting: Rheumatology

## 2018-10-25 NOTE — Telephone Encounter (Signed)
[name] for patient to call, and schedule a follow up appt with Dr. Estanislado Pandy for 1 year. (around 10/21/2018)

## 2018-10-25 NOTE — Telephone Encounter (Signed)
-----   Message from [place]son sent at 10/24/2018  3:46 PM EDT ----- Patient had virtual visit today with Dr. Estanislado Pandy. Please call to schedule 1 year follow up. Thanks!

## 2018-11-15 DIAGNOSIS — E291 Testicular hypofunction: Secondary | ICD-10-CM | POA: Diagnosis not present

## 2018-11-15 DIAGNOSIS — N411 Chronic prostatitis: Secondary | ICD-10-CM | POA: Diagnosis not present

## 2018-11-17 MED FILL — TESTOSTERONE CYP 100 MG/ML: 100 | 84 days supply | Qty: 10 | Fill #0

## 2018-11-29 ENCOUNTER — Encounter: Payer: Self-pay | Admitting: Family Medicine

## 2018-12-06 ENCOUNTER — Ambulatory Visit: Payer: Self-pay

## 2018-12-06 ENCOUNTER — Other Ambulatory Visit: Payer: Self-pay

## 2018-12-06 ENCOUNTER — Ambulatory Visit (INDEPENDENT_AMBULATORY_CARE_PROVIDER_SITE_OTHER): Payer: 59 | Admitting: Family Medicine

## 2018-12-06 ENCOUNTER — Encounter: Payer: Self-pay | Admitting: Family Medicine

## 2018-12-06 VITALS — BP 106/70 | HR 71 | Ht 72.0 in | Wt 207.0 lb

## 2018-12-06 DIAGNOSIS — M1812 Unilateral primary osteoarthritis of first carpometacarpal joint, left hand: Secondary | ICD-10-CM | POA: Diagnosis not present

## 2018-12-06 DIAGNOSIS — M79642 Pain in left hand: Secondary | ICD-10-CM

## 2018-12-06 NOTE — Progress Notes (Signed)
[name] [address] [place], [place] [address] Phone: [phone] Subjective:   I Erik [name] am serving as a Education administrator for Dr. Hulan Saas.    CC: Thumb pain follow-up  [id number]  Erik [name] is a 63 y.o. male coming in with complaint of thumb pain. States that he would like an injection. Explains that his thumb is inflamed. Grasping with his thumb is pain. Patient does have a moderate to severe pain.  Affecting daily activities.  All left-sided.  Affecting daily activities as well as sometimes painful at night.      Past Medical History:  Diagnosis Date  . ALLERGIC RHINITIS 12/19/2009  . Allergy   . Back pain chronic    On hydrocodone-Neurontin  . Blepharitis    B, chronic  . BPH (benign prostatic hyperplasia)   . Chronic prostatitis     f/u by urology, has a prostate massage prn  . GERD (gastroesophageal reflux disease)   . Groin injury   . HYPOGONADISM, MALE 08/11/2007   f/u by Dr Thomasene Mohair urology    . OSTEOPOROSIS, IDIOPATHIC 08/11/2007   f/u at [facility] , now improved to osteopenia  . Raynaud's disease /phenomenon 08/16/2012  . Rosacea   . Shingles 09-2014  . Varicose veins    Past Surgical History:  Procedure Laterality Date  . BLEPHAROPLASTY     B blepharoplasty @ [facility] 08-2011  . CERVICAL DISCECTOMY  1998  . CERVICAL FUSION  1994   C4-C5-C6 fusion  . COLONOSCOPY    . EYE SURGERY     eyelid surgery on both eyes  . HERNIA REPAIR Left 1979  . IR GENERIC HISTORICAL  03/17/2016   IR EMBO VENOUS NOT HEMORR HEMANG  INC GUIDE ROADMAPPING 03/17/2016 Greggory Keen, MD [facility]TRASOUND  . KNEE ARTHROSCOPY    . POLYPECTOMY    . SHOULDER SURGERY Right    early 2000s  . SIGMOIDOSCOPY    . TRANSURETHRAL RESECTION OF PROSTATE  07-2009   Social History   Socioeconomic History  . Marital status: Married    Spouse name: Not on file  . Number of children: 3  . Years of education: Not on file  . Highest education level: Not on  file  Occupational History  . Occupation: [employer]   Social Needs  . Financial resource strain: Not on file  . Food insecurity:    Worry: Not on file    Inability: Not on file  . Transportation needs:    Medical: Not on file    Non-medical: Not on file  Tobacco Use  . Smoking status: Never Smoker  . Smokeless tobacco: Never Used  Substance and Sexual Activity  . Alcohol use: No    Alcohol/week: 0.0 standard drinks  . Drug use: No  . Sexual activity: Not on file  Lifestyle  . Physical activity:    Days per week: Not on file    Minutes per session: Not on file  . Stress: Not on file  Relationships  . Social connections:    Talks on phone: Not on file    Gets together: Not on file    Attends religious service: Not on file    Active member of club or organization: Not on file    Attends meetings of clubs or organizations: Not on file    Relationship status: Not on file  Other Topics Concern  . Not on file  Social History Narrative   Lives with wife.  They  have three children.   Daughter is an Ship broker     Allergies  Allergen Reactions  . Sulfa Drugs Cross Reactors Nausea And Vomiting   Family History  Problem Relation Age of Onset  . COPD Father        Died, 71  . Hypertension Father   . Stroke Father   . CAD Father   . Allergic rhinitis Father   . Diabetes Mother   . Hypertension Mother   . CAD Mother           . Colon polyps Mother   . Cancer Mother        Ovarian cancer  . Hypertension Sister   . High Cholesterol Sister   . Colon cancer Maternal Aunt   . Prostate cancer Neg Hx   . Esophageal cancer Neg Hx   . Rectal cancer Neg Hx   . Stomach cancer Neg Hx   . Pancreatic cancer Neg Hx   . Angioedema Neg Hx   . Asthma Neg Hx   . Eczema Neg Hx   . Immunodeficiency Neg Hx   . Urticaria Neg Hx     Current Outpatient Medications (Endocrine & Metabolic):  .  testosterone cypionate (DEPOTESTOTERONE CYPIONATE) 100 MG/ML injection, Inject into the  muscle every 14 (fourteen) days. For IM use only  Current Outpatient Medications (Cardiovascular):  .  amLODipine (NORVASC) 2.5 MG tablet, Take 1 tablet (2.5 mg total) by mouth daily. .  sildenafil (REVATIO) 20 MG tablet, TAKE 1 TABLET (20 MG TOTAL) BY MOUTH AS NEEDED FOR UP TO 30 DAYS. Marland Kitchen  tadalafil (CIALIS) 5 MG tablet, Take 5 mg by mouth daily.  Current Outpatient Medications (Respiratory):  .  azelastine (ASTELIN) 0.1 % nasal spray, Place 2 sprays into both nostrils at bedtime as needed for rhinitis. Use in each nostril as directed .  cromolyn (NASALCROM) 5.2 MG/ACT nasal spray, Place 1 spray into both nostrils 4 (four) times daily. Marland Kitchen  ipratropium (ATROVENT) 0.03 % nasal spray, Use 2 sprays per nostril 20 minutes before eating. May use 2 sprays per nostril 3 times a day. .  loratadine (CLARITIN) 10 MG tablet, Take 10 mg by mouth daily as needed for allergies.    Current Outpatient Medications (Other):  .  gabapentin (NEURONTIN) 300 MG capsule, Take 1 capsule (300 mg total) by mouth at bedtime. .  lidocaine (XYLOCAINE) 5 % ointment, Apply 1 application topically as needed. .  Multiple Vitamin (MULTI-VITAMINS) TABS, Take by mouth. .  Omega-3 Fatty Acids (FISH OIL) 1000 MG CAPS, Take 1 capsule by mouth 2 (two) times daily. Marland Kitchen  omeprazole (PRILOSEC) 40 MG capsule, Use 1 capsule once or twice a day for heartburn and reflux .  OVER THE COUNTER MEDICATION, Take 1 tablet by mouth 2 (two) times daily. AREDS 2    Past medical history, social, surgical and family history all reviewed in electronic medical record.  No pertanent information unless stated regarding to the chief complaint.   Review of Systems:  No headache, visual changes, nausea, vomiting, diarrhea, constipation, dizziness, abdominal pain, skin rash, fevers, chills, night sweats, weight loss, swollen lymph nodes, , chest pain, shortness of breath, mood changes.  Positive muscle aches and joint swelling  Objective  Blood pressure  106/70, pulse 71, height 6' (1.829 m), weight 207 lb (93.9 kg), SpO2 96 %.    General: No apparent distress alert and oriented x3 mood and affect normal, dressed appropriately.  HEENT: Pupils equal, extraocular movements intact  Respiratory: Patient's speak  in full sentences and does not appear short of breath  Cardiovascular: No lower extremity edema, non tender, no erythema  Skin: Warm dry intact with no signs of infection or rash on extremities or on axial skeleton.  Abdomen: Soft nontender  Neuro: Cranial nerves II through XII are intact, neurovascularly intact in all extremities with 2+ DTRs and 2+ pulses.  Lymph: No lymphadenopathy of posterior or anterior cervical chain or axillae bilaterally.  Gait normal with good balance and coordination.  MSK: Patient's hand exam shows the patient does have a positive grind test.  Mild swelling of the [facility] joint compared to the contralateral side.  Patient does have negative Finkelstein's.  Good grip strength.  Good capillary refill  Procedure: Real-time Ultrasound Guided Injection of left CMC joint Device: GE Logiq Q7 Ultrasound guided injection is preferred based studies that show increased duration, increased effect, greater accuracy, decreased procedural pain, increased response rate, and decreased cost with ultrasound guided versus blind injection.  Verbal informed consent obtained.  Time-out conducted.  Noted no overlying erythema, induration, or other signs of local infection.  Skin prepped in a sterile fashion.  Local anesthesia: Topical Ethyl chloride.  With sterile technique and under real time ultrasound guidance: With a 25-gauge half inch needle patient was injected with 0.5 cc of 0.5% Marcaine and 0.5 cc of Kenalog 40 mg/mL Completed without difficulty  Pain immediately resolved suggesting accurate placement of the medication.  Advised to call if fevers/chills, erythema, induration, drainage, or persistent bleeding.  Images  permanently stored and available for review in the ultrasound unit.  Impression: Technically successful ultrasound guided injection.    Impression and Recommendations:     This case required medical decision making of moderate complexity. The above documentation has been reviewed and is accurate and complete Lyndal Pulley, DO       Note: This dictation was prepared with Dragon dictation along with smaller phrase technology. Any transcriptional errors that result from this process are unintentional.

## 2018-12-06 NOTE — Patient Instructions (Signed)
Tart cherry extract any dosage at night (vitamin) 6 week follow up

## 2018-12-06 NOTE — Assessment & Plan Note (Signed)
Patient given injection today.  Tolerated the procedure well.  Discussed icing regimen and home exercise.  Discussed which activities of doing which wants to avoid.  Patient did not do any bracing. The underlying uric acid deposits noted.  Patient will follow-up with me again in 4 to 8 weeks.

## 2018-12-12 ENCOUNTER — Encounter: Payer: Self-pay | Admitting: Family Medicine

## 2018-12-13 MED FILL — OMEPRAZOLE 40 MG CPDR: 40 | 30 days supply | Qty: 60 | Fill #2

## 2018-12-13 MED FILL — GABAPENTIN 300 MG CAPSULE: 300 | 90 days supply | Qty: 90 | Fill #1

## 2018-12-15 MED FILL — BD INTEGRA SYRN 3 ML 23GX1": 23G X 1" | 84 days supply | Qty: 6 | Fill #1

## 2018-12-15 MED FILL — BD INTEGRA SYRN 3 ML 23GX1: 23G X 1" | 84 days supply | Qty: 6 | Fill #1

## 2018-12-22 ENCOUNTER — Other Ambulatory Visit: Payer: Self-pay

## 2018-12-22 ENCOUNTER — Ambulatory Visit: Payer: 59 | Admitting: Internal Medicine

## 2018-12-22 ENCOUNTER — Encounter: Payer: Self-pay | Admitting: Internal Medicine

## 2018-12-22 VITALS — BP 126/85 | HR 69 | Temp 98.3°F | Resp 16 | Ht 72.0 in | Wt 207.5 lb

## 2018-12-22 DIAGNOSIS — R079 Chest pain, unspecified: Secondary | ICD-10-CM | POA: Diagnosis not present

## 2018-12-22 DIAGNOSIS — T148XXA Other injury of unspecified body region, initial encounter: Secondary | ICD-10-CM | POA: Diagnosis not present

## 2018-12-22 NOTE — Progress Notes (Signed)
Pre visit review using our clinic review tool, if applicable. No additional management support is needed unless otherwise documented below in the visit note. 

## 2018-12-22 NOTE — Progress Notes (Signed)
Subjective:    Patient ID: Erik [name], male    DOB: Jan 22, 1956, 63 y.o.   MRN: [id number]  DOS:  12/22/2018 Type of visit - description: Acute visit Got a splinter at the left thumb 5 days ago, initially the thumb but swollen and red, now looks better but is obvious  he has a foreign body.  Review of Systems No fever chills See last visit, had chest pain, symptoms are largely resolved  Past Medical History:  Diagnosis Date  . ALLERGIC RHINITIS 12/19/2009  . Allergy   . Back pain chronic    On hydrocodone-Neurontin  . Blepharitis    B, chronic  . BPH (benign prostatic hyperplasia)   . Chronic prostatitis     f/u by urology, has a prostate massage prn  . GERD (gastroesophageal reflux disease)   . Groin injury   . HYPOGONADISM, MALE 08/11/2007   f/u by Dr Thomasene Mohair urology    . OSTEOPOROSIS, IDIOPATHIC 08/11/2007   f/u at [facility] , now improved to osteopenia  . Raynaud's disease /phenomenon 08/16/2012  . Rosacea   . Shingles 09-2014  . Varicose veins     Past Surgical History:  Procedure Laterality Date  . BLEPHAROPLASTY     B blepharoplasty @ [facility] 08-2011  . CERVICAL DISCECTOMY  1998  . CERVICAL FUSION  1994   C4-C5-C6 fusion  . COLONOSCOPY    . EYE SURGERY     eyelid surgery on both eyes  . HERNIA REPAIR Left 1979  . IR GENERIC HISTORICAL  03/17/2016   IR EMBO VENOUS NOT HEMORR HEMANG  INC GUIDE ROADMAPPING 03/17/2016 Greggory Keen, MD [facility] ULTRASOUND  . KNEE ARTHROSCOPY    . POLYPECTOMY    . SHOULDER SURGERY Right    early 2000s  . SIGMOIDOSCOPY    . TRANSURETHRAL RESECTION OF PROSTATE  07-2009    Social History   Socioeconomic History  . Marital status: Married    Spouse name: Not on file  . Number of children: 3  . Years of education: Not on file  . Highest education level: Not on file  Occupational History  . Occupation: [employer]   Social Needs  . Financial resource strain: Not on file  . Food insecurity    Worry: Not on file    Inability: Not on  file  . Transportation needs    Medical: Not on file    Non-medical: Not on file  Tobacco Use  . Smoking status: Never Smoker  . Smokeless tobacco: Never Used  Substance and Sexual Activity  . Alcohol use: No    Alcohol/week: 0.0 standard drinks  . Drug use: No  . Sexual activity: Not on file  Lifestyle  . Physical activity    Days per week: Not on file    Minutes per session: Not on file  . Stress: Not on file  Relationships  . Social Herbalist on phone: Not on file    Gets together: Not on file    Attends religious service: Not on file    Active member of club or organization: Not on file    Attends meetings of clubs or organizations: Not on file    Relationship status: Not on file  . Intimate partner violence    Fear of current or ex partner: Not on file    Emotionally abused: Not on file    Physically abused: Not on file    Forced sexual activity: Not on file  Other  Topics Concern  . Not on file  Social History Narrative   Lives with wife.  They have three children.   Daughter is an Ship broker        Allergies as of 12/22/2018      Reactions   Sulfa Drugs Cross Reactors Nausea And Vomiting      Medication List       Accurate as of December 22, 2018  3:13 PM. If you have any questions, ask your nurse or doctor.        amLODipine 2.5 MG tablet Commonly known as: Norvasc Take 1 tablet (2.5 mg total) by mouth daily.   azelastine 0.1 % nasal spray Commonly known as: ASTELIN Place 2 sprays into both nostrils at bedtime as needed for rhinitis. Use in each nostril as directed   cromolyn 5.2 MG/ACT nasal spray Commonly known as: NASALCROM Place 1 spray into both nostrils 4 (four) times daily.   Fish Oil 1000 MG Caps Take 1 capsule by mouth 2 (two) times daily.   gabapentin 300 MG capsule Commonly known as: NEURONTIN Take 1 capsule (300 mg total) by mouth at bedtime.   ipratropium 0.03 % nasal spray Commonly known as: ATROVENT Use 2 sprays per  nostril 20 minutes before eating. May use 2 sprays per nostril 3 times a day.   lidocaine 5 % ointment Commonly known as: XYLOCAINE Apply 1 application topically as needed.   loratadine 10 MG tablet Commonly known as: CLARITIN Take 10 mg by mouth daily as needed for allergies.   Multi-Vitamins Tabs Take by mouth.   omeprazole 40 MG capsule Commonly known as: PRILOSEC Use 1 capsule once or twice a day for heartburn and reflux   OVER THE COUNTER MEDICATION Take 1 tablet by mouth 2 (two) times daily. AREDS 2   sildenafil 20 MG tablet Commonly known as: REVATIO TAKE 1 TABLET (20 MG TOTAL) BY MOUTH AS NEEDED FOR UP TO 30 DAYS.   tadalafil 5 MG tablet Commonly known as: CIALIS Take 5 mg by mouth daily.   testosterone cypionate 100 MG/ML injection Commonly known as: DEPOTESTOTERONE CYPIONATE Inject into the muscle every 14 (fourteen) days. For IM use only           Objective:   Physical Exam BP 126/85 (BP Location: Left Arm, Patient Position: Sitting, Cuff Size: Small)   Pulse 69   Temp 98.3 F (36.8 C) (Oral)   Resp 16   Ht 6' (1.829 m)   Wt 207 lb 8 oz (94.1 kg)   SpO2 99%   BMI 28.14 kg/m  General:   Well developed, NAD, BMI noted. HEENT:  Normocephalic . Face symmetric, atraumatic Skin: At the left thumb, palmar aspect, has a approximately 9 mm splinter superficially under the skin. Neurologic:  alert & oriented X3.  Speech normal, gait appropriate for age and unassisted Psych--  Cognition and judgment appear intact.  Cooperative with normal attention span and concentration.  Behavior appropriate. No anxious or depressed appearing.      Assessment      Assessment   Osteoporosis, idiopathic, follow-up at [facility]. Raynaud phenomena 2013--Dr Truslow, amlodipine prn. Now sees another rheumatologist [facility] -- s/p Cervical fusion --Chronic back pain: used to be on Hydrocodone rx by pcp;  on gabapentin ) -- L shoulder adhesive capsulitis -- stress fracture L  sesamoid (foot) dx 2018) GU: Dr. Thomasene Mohair, urology --BPH, TURP 2011 --Chronic prostatitis --Hypogonadism, per urology Rosacea Varicose veins treatment @ [facility] OPHT: Glaucoma -Chronic blepharitis- ocular rosacea- Mac  degeneration H/o Shingles 09-2014   PLAN Splinter: In a sterile fashion I remove it using forceps and a sharp scissor.  Patient tolerated well, no bleeding.  Area was cleaned up and I put a Band-Aid on it.  Recommend to call if he has any fever, chills, discharge, redness, swelling.  He is up-to-date on his tetanus shot. Chest pain: See last visit, largely resolved

## 2018-12-23 NOTE — Assessment & Plan Note (Signed)
Splinter: In a sterile fashion I remove it using forceps and a sharp scissor.  Patient tolerated well, no bleeding.  Area was cleaned up and I put a Band-Aid on it.  Recommend to call if he has any fever, chills, discharge, redness, swelling.  He is up-to-date on his tetanus shot. Chest pain: See last visit, largely resolved

## 2019-01-02 DIAGNOSIS — N411 Chronic prostatitis: Secondary | ICD-10-CM | POA: Diagnosis not present

## 2019-01-17 ENCOUNTER — Ambulatory Visit (INDEPENDENT_AMBULATORY_CARE_PROVIDER_SITE_OTHER): Payer: 59 | Admitting: Family Medicine

## 2019-01-17 ENCOUNTER — Encounter: Payer: Self-pay | Admitting: Family Medicine

## 2019-01-17 ENCOUNTER — Other Ambulatory Visit: Payer: Self-pay

## 2019-01-17 DIAGNOSIS — M1812 Unilateral primary osteoarthritis of first carpometacarpal joint, left hand: Secondary | ICD-10-CM

## 2019-01-17 NOTE — Patient Instructions (Signed)
[phone] See me when you need me

## 2019-01-17 NOTE — Assessment & Plan Note (Signed)
Significant improvement after the injection.  Discussed icing regimen and home exercise.  Discussed which activities of doing which wants to avoid.  Follow-up again as needed

## 2019-01-17 NOTE — Progress Notes (Signed)
[name] [address] [place], [place] [address] Phone: [phone] Subjective:     CC: Neck and thumb pain  WPV:[id number]   12/06/2018: Patient given injection today.  Tolerated the procedure well.  Discussed icing regimen and home exercise.  Discussed which activities of doing which wants to avoid.  Patient did not do any bracing. The underlying uric acid deposits noted.  Patient will follow-up with me again in 4 to 8 weeks.  Update 01/17/2019: Erik [name] is a 63 y.o. male coming in with complaint of thumb pain. Took 10 days before injection seemed to help relieve his pain. Still has pain with gripping but otherwise improved.     Past Medical History:  Diagnosis Date  . ALLERGIC RHINITIS 12/19/2009  . Allergy   . Back pain chronic    On hydrocodone-Neurontin  . Blepharitis    B, chronic  . BPH (benign prostatic hyperplasia)   . Chronic prostatitis     f/u by urology, has a prostate massage prn  . GERD (gastroesophageal reflux disease)   . Groin injury   . HYPOGONADISM, MALE 08/11/2007   f/u by Dr Erik [name] urology    . OSTEOPOROSIS, IDIOPATHIC 08/11/2007   f/u at [facility] , now improved to osteopenia  . Raynaud's disease /phenomenon 08/16/2012  . Rosacea   . Shingles 09-2014  . Varicose veins    Past Surgical History:  Procedure Laterality Date  . BLEPHAROPLASTY     B blepharoplasty @ [facility] 08-2011  . CERVICAL DISCECTOMY  1998  . CERVICAL FUSION  1994   C4-C5-C6 fusion  . COLONOSCOPY    . EYE SURGERY     eyelid surgery on both eyes  . HERNIA REPAIR Left 1979  . IR GENERIC HISTORICAL  03/17/2016   IR EMBO VENOUS NOT HEMORR HEMANG  INC GUIDE ROADMAPPING 03/17/2016 Erik Keen, MD [facility]TRASOUND  . KNEE ARTHROSCOPY    . POLYPECTOMY    . SHOULDER SURGERY Right    early 2000s  . SIGMOIDOSCOPY    . TRANSURETHRAL RESECTION OF PROSTATE  07-2009   Social History   Socioeconomic History  . Marital status: Married    Spouse name: Not  on file  . Number of children: 3  . Years of education: Not on file  . Highest education level: Not on file  Occupational History  . Occupation: [employer]   Social Needs  . Financial resource strain: Not on file  . Food insecurity    Worry: Not on file    Inability: Not on file  . Transportation needs    Medical: Not on file    Non-medical: Not on file  Tobacco Use  . Smoking status: Never Smoker  . Smokeless tobacco: Never Used  Substance and Sexual Activity  . Alcohol use: No    Alcohol/week: 0.0 standard drinks  . Drug use: No  . Sexual activity: Not on file  Lifestyle  . Physical activity    Days per week: Not on file    Minutes per session: Not on file  . Stress: Not on file  Relationships  . Social Herbalist on phone: Not on file    Gets together: Not on file    Attends religious service: Not on file    Active member of club or organization: Not on file    Attends meetings of clubs or organizations: Not on file    Relationship status: Not on file  Other Topics  Concern  . Not on file  Social History Narrative   Lives with wife.  They have three children.   Daughter is an Ship broker     Allergies  Allergen Reactions  . Sulfa Drugs Cross Reactors Nausea And Vomiting   Family History  Problem Relation Age of Onset  . COPD Father        Died, 71  . Hypertension Father   . Stroke Father   . CAD Father   . Allergic rhinitis Father   . Diabetes Mother   . Hypertension Mother   . CAD Mother           . Colon polyps Mother   . Cancer Mother        Ovarian cancer  . Hypertension Sister   . High Cholesterol Sister   . Colon cancer Maternal Aunt   . Prostate cancer Neg Hx   . Esophageal cancer Neg Hx   . Rectal cancer Neg Hx   . Stomach cancer Neg Hx   . Pancreatic cancer Neg Hx   . Angioedema Neg Hx   . Asthma Neg Hx   . Eczema Neg Hx   . Immunodeficiency Neg Hx   . Urticaria Neg Hx     Current Outpatient Medications (Endocrine &  Metabolic):  .  testosterone cypionate (DEPOTESTOTERONE CYPIONATE) 100 MG/ML injection, Inject into the muscle every 14 (fourteen) days. For IM use only  Current Outpatient Medications (Cardiovascular):  .  amLODipine (NORVASC) 2.5 MG tablet, Take 1 tablet (2.5 mg total) by mouth daily. .  sildenafil (REVATIO) 20 MG tablet, TAKE 1 TABLET (20 MG TOTAL) BY MOUTH AS NEEDED FOR UP TO 30 DAYS. Marland Kitchen  tadalafil (CIALIS) 5 MG tablet, Take 5 mg by mouth daily.  Current Outpatient Medications (Respiratory):  .  azelastine (ASTELIN) 0.1 % nasal spray, Place 2 sprays into both nostrils at bedtime as needed for rhinitis. Use in each nostril as directed .  cromolyn (NASALCROM) 5.2 MG/ACT nasal spray, Place 1 spray into both nostrils 4 (four) times daily. Marland Kitchen  ipratropium (ATROVENT) 0.03 % nasal spray, Use 2 sprays per nostril 20 minutes before eating. May use 2 sprays per nostril 3 times a day. .  loratadine (CLARITIN) 10 MG tablet, Take 10 mg by mouth daily as needed for allergies.    Current Outpatient Medications (Other):  .  gabapentin (NEURONTIN) 300 MG capsule, Take 1 capsule (300 mg total) by mouth at bedtime. .  lidocaine (XYLOCAINE) 5 % ointment, Apply 1 application topically as needed. .  Multiple Vitamin (MULTI-VITAMINS) TABS, Take by mouth. .  Omega-3 Fatty Acids (FISH OIL) 1000 MG CAPS, Take 1 capsule by mouth 2 (two) times daily. Marland Kitchen  omeprazole (PRILOSEC) 40 MG capsule, Use 1 capsule once or twice a day for heartburn and reflux .  OVER THE COUNTER MEDICATION, Take 1 tablet by mouth 2 (two) times daily. AREDS 2    Past medical history, social, surgical and family history all reviewed in electronic medical record.  No pertanent information unless stated regarding to the chief complaint.   Review of Systems:  No headache, visual changes, nausea, vomiting, diarrhea, constipation, dizziness, abdominal pain, skin rash, fevers, chills, night sweats, weight loss, swollen lymph nodes, body aches, joint  swelling, muscle aches, chest pain, shortness of breath, mood changes.   Objective  There were no vitals taken for this visit. Systems examined below as of    General: No apparent distress alert and oriented x3 mood and affect normal, dressed  appropriately.  HEENT: Pupils equal, extraocular movements intact  Respiratory: Patient's speak in full sentences and does not appear short of breath  Cardiovascular: No lower extremity edema, non tender, no erythema  Skin: Warm dry intact with no signs of infection or rash on extremities or on axial skeleton.  Abdomen: Soft nontender  Neuro: Cranial nerves II through XII are intact, neurovascularly intact in all extremities with 2+ DTRs and 2+ pulses.  Lymph: No lymphadenopathy of posterior or anterior cervical chain or axillae bilaterally.  Gait normal with good balance and coordination.  MSK:  Non tender with full range of motion and good stability and symmetric strength and tone of shoulders, elbows, , hip, knee and ankles bilaterally.  neck: Inspection mild loss of lordosis. No palpable stepoffs. Negative Spurling's maneuver. Full neck range of motion Grip strength and sensation normal in bilateral hands Strength good C4 to T1 distribution No sensory change to C4 to T1 Negative Hoffman sign bilaterally Reflexes normal  Left CMC joints significant decrease in swelling.  Mild positive grind.  Better grip strength.   Impression and Recommendations:      The above documentation has been reviewed and is accurate and complete Lyndal Pulley, DO       Note: This dictation was prepared with Dragon dictation along with smaller phrase technology. Any transcriptional errors that result from this process are unintentional.

## 2019-01-27 DIAGNOSIS — M9903 Segmental and somatic dysfunction of lumbar region: Secondary | ICD-10-CM | POA: Diagnosis not present

## 2019-01-27 DIAGNOSIS — M9902 Segmental and somatic dysfunction of thoracic region: Secondary | ICD-10-CM | POA: Diagnosis not present

## 2019-01-27 DIAGNOSIS — M5432 Sciatica, left side: Secondary | ICD-10-CM | POA: Diagnosis not present

## 2019-01-27 DIAGNOSIS — M9905 Segmental and somatic dysfunction of pelvic region: Secondary | ICD-10-CM | POA: Diagnosis not present

## 2019-02-09 DIAGNOSIS — E291 Testicular hypofunction: Secondary | ICD-10-CM | POA: Diagnosis not present

## 2019-02-10 DIAGNOSIS — M9905 Segmental and somatic dysfunction of pelvic region: Secondary | ICD-10-CM | POA: Diagnosis not present

## 2019-02-10 DIAGNOSIS — M5432 Sciatica, left side: Secondary | ICD-10-CM | POA: Diagnosis not present

## 2019-02-10 DIAGNOSIS — M9902 Segmental and somatic dysfunction of thoracic region: Secondary | ICD-10-CM | POA: Diagnosis not present

## 2019-02-10 DIAGNOSIS — M9903 Segmental and somatic dysfunction of lumbar region: Secondary | ICD-10-CM | POA: Diagnosis not present

## 2019-02-14 DIAGNOSIS — E291 Testicular hypofunction: Secondary | ICD-10-CM | POA: Diagnosis not present

## 2019-02-14 DIAGNOSIS — N411 Chronic prostatitis: Secondary | ICD-10-CM | POA: Diagnosis not present

## 2019-03-01 DIAGNOSIS — N411 Chronic prostatitis: Secondary | ICD-10-CM | POA: Diagnosis not present

## 2019-03-02 ENCOUNTER — Other Ambulatory Visit: Payer: Self-pay

## 2019-03-02 ENCOUNTER — Emergency Department (INDEPENDENT_AMBULATORY_CARE_PROVIDER_SITE_OTHER): Payer: 59

## 2019-03-02 ENCOUNTER — Emergency Department
Admission: EM | Admit: 2019-03-02 | Discharge: 2019-03-02 | Disposition: A | Discharge status: Home / Self Care | Payer: 59 | Source: Home / Self Care

## 2019-03-02 DIAGNOSIS — R0789 Other chest pain: Secondary | ICD-10-CM

## 2019-03-02 DIAGNOSIS — R0781 Pleurodynia: Secondary | ICD-10-CM | POA: Diagnosis not present

## 2019-03-02 DIAGNOSIS — S299XXA Unspecified injury of thorax, initial encounter: Secondary | ICD-10-CM | POA: Diagnosis not present

## 2019-03-02 MED ORDER — IBUPROFEN 600 MG PO TABS: 600.0000 mg | ORAL_TABLET | Freq: Once | ORAL | Status: DC

## 2019-03-02 MED ORDER — HYDROCODONE-ACETAMINOPHEN 5-325 MG PO TABS: 1.0000 | ORAL_TABLET | Freq: Four times a day (QID) | ORAL | 0 refills | Status: DC | PRN

## 2019-03-02 NOTE — ED Triage Notes (Signed)
Pt was working in daughters attic today, and joist gave way, and fell on right ribs.  Pt heard and felt a pop.

## 2019-03-02 NOTE — Discharge Instructions (Signed)
°  Norco/Vicodin (hydrocodone-acetaminophen) is a narcotic pain medication, do not combine these medications with others containing tylenol. While taking, do not drink alcohol, drive, or perform any other activities that requires focus while taking these medications.   Please follow up with family medicine in 1 week if not improving, sooner if worsening or new concerning symptoms develop- fever, difficulty breathing, cough.

## 2019-03-03 NOTE — ED Provider Notes (Signed)
[place]    CSN: [id number] Arrival date & time: 03/02/19  1649      History   Chief Complaint Chief Complaint  Patient presents with  . Chest Pain    HPI Erik [name] is a 63 y.o. male.   HPI  Erik [name] is a 63 y.o. male presenting to UC with c/o gradually worsening Right lower rib pain after falling and hitting a wood joist while in his daughter's attic earlier today.  Pt felt a "pop" in the area of pain. Pain is sharp and aching, worse with deep breaths and certain movements. Denies other injuries from the fall. Denies difficulty breathing.  He has had rib fractures in the past, he believes on Left side, pain feels similar.    Past Medical History:  Diagnosis Date  . ALLERGIC RHINITIS 12/19/2009  . Allergy   . Back pain chronic    On hydrocodone-Neurontin  . Blepharitis    B, chronic  . BPH (benign prostatic hyperplasia)   . Chronic prostatitis     f/u by urology, has a prostate massage prn  . GERD (gastroesophageal reflux disease)   . Groin injury   . HYPOGONADISM, MALE 08/11/2007   f/u by Dr Thomasene Mohair urology    . OSTEOPOROSIS, IDIOPATHIC 08/11/2007   f/u at [facility] , now improved to osteopenia  . Raynaud's disease /phenomenon 08/16/2012  . Rosacea   . Shingles 09-2014  . Varicose veins     Patient Active Problem List   Diagnosis Date Noted  . Arthritis of carpometacarpal [facility]) joint of left thumb 12/06/2018  . Early stage glaucoma 07/26/2018  . Enlarged prostate 07/26/2018  . Raynaud's disease without gangrene 10/08/2017  . 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 Lumbar radiculopathy 03/25/2017  . Degenerative cervical disc 06/08/2016  . Degenerative lumbar disc 06/08/2016  . PCP NOTES >>> 04/29/2015  . Binocular vision disorder with diplopia 10/01/2014  . 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 Herpes zoster-- 09-2014 09/05/2014  . Anal fissure 02/28/2014  . Chronic  prostatitis 02/28/2014  . Abnormal prostate specific antigen 02/28/2014  . Gastroesophageal reflux disease without esophagitis 02/28/2014  . Ganglion of tendon 02/28/2014  . Incomplete bladder emptying 02/28/2014  . FOM (frequency of micturition) 02/28/2014  . Nonexudative age-related macular degeneration 02/12/2014  . 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 Annual physical exam 10/21/2011  . Neck -- back pain- UDS 09/18/2011  . Other allergic rhinitis 12/19/2009  . PELVIC PAIN, CHRONIC 04/03/2009  . Hypogonadism -- per urology 08/11/2007  . OSTEOPOROSIS, IDIOPATHIC 08/11/2007  . HEADACHE 08/11/2007    Past Surgical History:  Procedure Laterality Date  . BLEPHAROPLASTY     B blepharoplasty @ [facility] 08-2011  . CERVICAL DISCECTOMY  1998  . CERVICAL FUSION  1994   C4-C5-C6 fusion  . COLONOSCOPY    . EYE SURGERY     eyelid surgery on both eyes  . HERNIA REPAIR Left 1979  . IR GENERIC HISTORICAL  03/17/2016   IR EMBO VENOUS NOT HEMORR HEMANG  INC GUIDE ROADMAPPING 03/17/2016 Greggory Keen, MD [facility] ULTRASOUND  . KNEE ARTHROSCOPY    . POLYPECTOMY    . SHOULDER SURGERY Right    early 2000s  . SIGMOIDOSCOPY    . TRANSURETHRAL RESECTION OF PROSTATE  07-2009       Home Medications    Prior to Admission medications   Medication Sig Start Date End Date Taking? Authorizing  Provider  amLODipine (NORVASC) 2.5 MG tablet Take 1 tablet (2.5 mg total) by mouth daily. 10/20/17   Bo Merino, MD  azelastine (ASTELIN) 0.1 % nasal spray Place 2 sprays into both nostrils at bedtime as needed for rhinitis. Use in each nostril as directed 06/09/17   Colon Branch, MD  cromolyn (NASALCROM) 5.2 MG/ACT nasal spray Place 1 spray into both nostrils 4 (four) times daily. 06/10/18   Colon Branch, MD  gabapentin (NEURONTIN) 300 MG capsule Take 1 capsule (300 mg total) by mouth at bedtime. 09/02/18   Lyndal Pulley, DO  HYDROcodone-acetaminophen (NORCO/VICODIN) 5-325 MG tablet Take 1 tablet by  mouth every 6 (six) hours as needed for moderate pain or severe pain. 03/02/19   Noe Gens, PA-C  ipratropium (ATROVENT) 0.03 % nasal spray Use 2 sprays per nostril 20 minutes before eating. May use 2 sprays per nostril 3 times a day. 07/26/18   Charlies Silvers, MD  lidocaine (XYLOCAINE) 5 % ointment Apply 1 application topically as needed. 09/20/17   Levin Erp, PA  loratadine (CLARITIN) 10 MG tablet Take 10 mg by mouth daily as needed for allergies.    [provider]  Multiple Vitamin (MULTI-VITAMINS) TABS Take by mouth.    [provider]  Omega-3 Fatty Acids (FISH OIL) 1000 MG CAPS Take 1 capsule by mouth 2 (two) times daily.    [provider]  omeprazole (PRILOSEC) 40 MG capsule Use 1 capsule once or twice a day for heartburn and reflux 07/26/18   Charlies Silvers, MD  OVER THE COUNTER MEDICATION Take 1 tablet by mouth 2 (two) times daily. AREDS 2    [provider]  sildenafil (REVATIO) 20 MG tablet TAKE 1 TABLET (20 MG TOTAL) BY MOUTH AS NEEDED FOR UP TO 30 DAYS. 06/27/18   [provider]  tadalafil (CIALIS) 5 MG tablet Take 5 mg by mouth daily.    [provider]  testosterone cypionate (DEPOTESTOTERONE CYPIONATE) 100 MG/ML injection Inject into the muscle every 14 (fourteen) days. For IM use only    [provider]    Family History Family History  Problem Relation Age of Onset  . COPD Father        Died, 71  . Hypertension Father   . Stroke Father   . CAD Father   . Allergic rhinitis Father   . Diabetes Mother   . Hypertension Mother   . CAD Mother           . Colon polyps Mother   . Cancer Mother        Ovarian cancer  . Hypertension Sister   . High Cholesterol Sister   . Colon cancer Maternal Aunt   . Prostate cancer Neg Hx   . Esophageal cancer Neg Hx   . Rectal cancer Neg Hx   . Stomach cancer Neg Hx   . Pancreatic cancer Neg Hx   . Angioedema Neg Hx   . Asthma Neg Hx   . Eczema Neg Hx    . Immunodeficiency Neg Hx   . Urticaria Neg Hx     Social History Social History   Tobacco Use  . Smoking status: Never Smoker  . Smokeless tobacco: Never Used  Substance Use Topics  . Alcohol use: No    Alcohol/week: 0.0 standard drinks  . Drug use: No     Allergies   Sulfa drugs cross reactors   Review of Systems Review of Systems  Constitutional:  Negative for chills and fever.  Respiratory: Negative for chest tightness and shortness of breath.   Cardiovascular: Positive for chest pain (Right side rib pain). Negative for palpitations.  Skin: Positive for color change. Negative for rash and wound.     Physical Exam Triage Vital Signs ED Triage Vitals  Enc Vitals Group     BP 03/02/19 1710 120/73     Pulse Rate 03/02/19 1710 76     Resp 03/02/19 1710 20     Temp 03/02/19 1710 98.2 F (36.8 C)     Temp Source 03/02/19 1710 Oral     SpO2 03/02/19 1710 97 %     Weight 03/02/19 1711 206 lb (93.4 kg)     Height 03/02/19 1711 6' (1.829 m)     Head Circumference --      Peak Flow --      Pain Score 03/02/19 1711 8     Pain Loc --      Pain Edu? --      Excl. in [place]? --    No data found.  Updated Vital Signs BP 120/73 (BP Location: Right Arm)   Pulse 76   Temp 98.2 F (36.8 C) (Oral)   Resp 20   Ht 6' (1.829 m)   Wt 206 lb (93.4 kg)   SpO2 97%   BMI 27.94 kg/m   Visual Acuity Right Eye Distance:   Left Eye Distance:   Bilateral Distance:    Right Eye Near:   Left Eye Near:    Bilateral Near:     Physical Exam Vitals signs and nursing note reviewed.  Constitutional:      General: He is not in acute distress.    Appearance: He is well-developed.  HENT:     Head: Normocephalic and atraumatic.  Neck:     Musculoskeletal: Normal range of motion.  Cardiovascular:     Rate and Rhythm: Normal rate and regular rhythm.  Pulmonary:     Effort: Pulmonary effort is normal.     Breath sounds: No decreased breath sounds, wheezing, rhonchi or rales.   Chest:     Chest wall: Tenderness present. No deformity or swelling.    Musculoskeletal: Normal range of motion.  Skin:    General: Skin is warm and dry.  Neurological:     Mental Status: He is alert and oriented to person, place, and time.  Psychiatric:        Behavior: Behavior normal.      UC Treatments / Results  Labs (all labs ordered are listed, but only abnormal results are displayed) Labs Reviewed - No data to display  EKG   Radiology Dg Ribs Unilateral W/chest Right  Result Date: 03/02/2019 CLINICAL DATA:  Acute RIGHT chest and rib pain following fall today. Initial encounter. EXAM: RIGHT RIBS AND CHEST - 3+ VIEW COMPARISON:  None. FINDINGS: No fracture or other bone lesions are seen involving the ribs. There is no evidence of pneumothorax or pleural effusion. Both lungs are clear. Heart size and mediastinal contours are within normal limits. IMPRESSION: Negative. Electronically Signed   By: Margarette Canada M.D.   On: 03/02/2019 17:43    Procedures Procedures (including critical care time)  Medications Ordered in UC Medications - No data to display  Initial Impression / Assessment and Plan / UC Course  I have reviewed the triage vital signs and the nursing notes.  Pertinent labs & imaging results that were available during my care of the patient were reviewed  by me and considered in my medical decision making (see chart for details).     Reviewed imaging with pt Will tx pt symptomatically for pain AVS provided  Final Clinical Impressions(s) / UC Diagnoses   Final diagnoses:  Right-sided chest wall pain     Discharge Instructions      Norco/Vicodin (hydrocodone-acetaminophen) is a narcotic pain medication, do not combine these medications with others containing tylenol. While taking, do not drink alcohol, drive, or perform any other activities that requires focus while taking these medications.   Please follow up with family medicine in 1 week if not  improving, sooner if worsening or new concerning symptoms develop- fever, difficulty breathing, cough.     ED Prescriptions    Medication Sig Dispense Auth. Provider   HYDROcodone-acetaminophen (NORCO/VICODIN) 5-325 MG tablet Take 1 tablet by mouth every 6 (six) hours as needed for moderate pain or severe pain. 10 tablet Noe Gens, PA-C     Controlled Substance Prescriptions [place] Controlled Substance Registry consulted? Yes, I have consulted the [place] Controlled Substances Registry for this patient, and feel the risk/benefit ratio today is favorable for proceeding with this prescription for a controlled substance.   Noe Gens, [place] 03/03/19 1057

## 2019-03-07 DIAGNOSIS — U071 COVID-19: Secondary | ICD-10-CM

## 2019-03-07 HISTORY — DX: COVID-19: U07.1

## 2019-03-08 ENCOUNTER — Other Ambulatory Visit: Payer: Self-pay

## 2019-03-08 DIAGNOSIS — R6889 Other general symptoms and signs: Secondary | ICD-10-CM | POA: Diagnosis not present

## 2019-03-08 DIAGNOSIS — Z20822 Contact with and (suspected) exposure to covid-19: Secondary | ICD-10-CM

## 2019-03-09 LAB — NOVEL CORONAVIRUS, NAA: SARS-CoV-2, NAA: DETECTED — AB

## 2019-03-10 ENCOUNTER — Other Ambulatory Visit: Payer: Self-pay | Admitting: Family Medicine

## 2019-03-10 MED FILL — GABAPENTIN 300 MG CAPSULE: 300 | 90 days supply | Qty: 90 | Fill #0

## 2019-03-15 DIAGNOSIS — M542 Cervicalgia: Secondary | ICD-10-CM | POA: Diagnosis not present

## 2019-03-15 DIAGNOSIS — E785 Hyperlipidemia, unspecified: Secondary | ICD-10-CM | POA: Diagnosis not present

## 2019-03-15 DIAGNOSIS — G478 Other sleep disorders: Secondary | ICD-10-CM | POA: Diagnosis not present

## 2019-03-15 DIAGNOSIS — L708 Other acne: Secondary | ICD-10-CM | POA: Diagnosis not present

## 2019-03-15 DIAGNOSIS — K219 Gastro-esophageal reflux disease without esophagitis: Secondary | ICD-10-CM | POA: Diagnosis not present

## 2019-03-15 DIAGNOSIS — M545 Low back pain: Secondary | ICD-10-CM | POA: Diagnosis not present

## 2019-03-17 ENCOUNTER — Encounter: Payer: Self-pay | Admitting: Family Medicine

## 2019-03-17 DIAGNOSIS — M9903 Segmental and somatic dysfunction of lumbar region: Secondary | ICD-10-CM | POA: Diagnosis not present

## 2019-03-17 DIAGNOSIS — M9905 Segmental and somatic dysfunction of pelvic region: Secondary | ICD-10-CM | POA: Diagnosis not present

## 2019-03-17 DIAGNOSIS — M5432 Sciatica, left side: Secondary | ICD-10-CM | POA: Diagnosis not present

## 2019-03-17 DIAGNOSIS — M9902 Segmental and somatic dysfunction of thoracic region: Secondary | ICD-10-CM | POA: Diagnosis not present

## 2019-03-20 DIAGNOSIS — M9903 Segmental and somatic dysfunction of lumbar region: Secondary | ICD-10-CM | POA: Diagnosis not present

## 2019-03-20 DIAGNOSIS — M9905 Segmental and somatic dysfunction of pelvic region: Secondary | ICD-10-CM | POA: Diagnosis not present

## 2019-03-20 DIAGNOSIS — M5432 Sciatica, left side: Secondary | ICD-10-CM | POA: Diagnosis not present

## 2019-03-20 DIAGNOSIS — M9902 Segmental and somatic dysfunction of thoracic region: Secondary | ICD-10-CM | POA: Diagnosis not present

## 2019-03-21 ENCOUNTER — Other Ambulatory Visit: Payer: Self-pay

## 2019-03-21 DIAGNOSIS — M5416 Radiculopathy, lumbar region: Secondary | ICD-10-CM

## 2019-03-22 DIAGNOSIS — M9903 Segmental and somatic dysfunction of lumbar region: Secondary | ICD-10-CM | POA: Diagnosis not present

## 2019-03-22 DIAGNOSIS — M9905 Segmental and somatic dysfunction of pelvic region: Secondary | ICD-10-CM | POA: Diagnosis not present

## 2019-03-22 DIAGNOSIS — M5432 Sciatica, left side: Secondary | ICD-10-CM | POA: Diagnosis not present

## 2019-03-22 DIAGNOSIS — M9902 Segmental and somatic dysfunction of thoracic region: Secondary | ICD-10-CM | POA: Diagnosis not present

## 2019-03-23 MED FILL — OMEPRAZOLE 40 MG CPDR: 40 | 30 days supply | Qty: 60 | Fill #3

## 2019-03-23 MED FILL — TESTOSTERONE CYP 100 MG/ML: 100 | 84 days supply | Qty: 10 | Fill #1

## 2019-03-23 MED FILL — GABAPENTIN 300 MG CAPSULE: 300 | 90 days supply | Qty: 90 | Fill #0

## 2019-03-27 ENCOUNTER — Other Ambulatory Visit: Payer: 59

## 2019-03-28 ENCOUNTER — Encounter: Payer: Self-pay | Admitting: Family Medicine

## 2019-03-28 ENCOUNTER — Other Ambulatory Visit: Payer: Self-pay

## 2019-03-28 ENCOUNTER — Ambulatory Visit
Admission: RE | Admit: 2019-03-28 | Discharge: 2019-03-28 | Disposition: A | Discharge status: Home / Self Care | Payer: 59 | Source: Ambulatory Visit | Attending: Family Medicine | Admitting: Family Medicine

## 2019-03-28 DIAGNOSIS — M545 Low back pain: Secondary | ICD-10-CM | POA: Diagnosis not present

## 2019-03-28 DIAGNOSIS — M5416 Radiculopathy, lumbar region: Secondary | ICD-10-CM

## 2019-03-28 MED ORDER — METHYLPREDNISOLONE ACETATE 40 MG/ML INJ SUSP (RADIOLOG: 120.0000 mg | Freq: Once | INTRAMUSCULAR | Status: DC

## 2019-03-28 MED ORDER — IOPAMIDOL (ISOVUE-M 200) INJECTION 41%: 1.0000 mL | Freq: Once | INTRAMUSCULAR | Status: DC

## 2019-04-12 ENCOUNTER — Encounter: Payer: Self-pay | Admitting: Family Medicine

## 2019-04-12 ENCOUNTER — Ambulatory Visit (INDEPENDENT_AMBULATORY_CARE_PROVIDER_SITE_OTHER): Payer: 59 | Admitting: Family Medicine

## 2019-04-12 VITALS — Ht 72.0 in

## 2019-04-12 DIAGNOSIS — M5416 Radiculopathy, lumbar region: Secondary | ICD-10-CM | POA: Diagnosis not present

## 2019-04-12 DIAGNOSIS — M5136 Other intervertebral disc degeneration, lumbar region: Secondary | ICD-10-CM | POA: Diagnosis not present

## 2019-04-12 NOTE — Progress Notes (Signed)
Virtual Visit via Video Note  I connected with Erik [name] on 04/12/19 at 12:30 PM EDT by a video enabled telemedicine application and verified that I am speaking with the correct person using two identifiers.  Location: Patient: In home setting Provider: In office setting   I discussed the limitations of evaluation and management by telemedicine and the availability of in person appointments. The patient expressed understanding and agreed to proceed.  History of Present Illness: 63 year old male with back pain and back surgery with a laminectomy.  Having signs and symptoms that was consistent with more of a lumbar radiculopathy.  Underwent an epidural 3 weeks ago.  States that he is feeling 95% better at this point.  Patient has been increasing activity slowly.  Minimal discomfort overall.  Patient states that still recovering from a recent hospitalization for COVID though.    Observations/Objective: Alert and oriented x3   Assessment and Plan: 63 year old status post laminotomy responded well to epidural.  Doing well with conservative therapy.  Increase activity, home exercises.  The back pain will follow-up as needed   Follow Up Instructions: As needed    I discussed the assessment and treatment plan with the patient. The patient was provided an opportunity to ask questions and all were answered. The patient agreed with the plan and demonstrated an understanding of the instructions.   The patient was advised to call back or seek an in-person evaluation if the symptoms worsen or if the condition fails to improve as anticipated.  I provided 12 minutes of non-face-to-face time during this encounter.   Lyndal Pulley, DO

## 2019-04-14 ENCOUNTER — Other Ambulatory Visit: Payer: Self-pay

## 2019-04-14 ENCOUNTER — Ambulatory Visit: Payer: 59

## 2019-04-14 ENCOUNTER — Ambulatory Visit (INDEPENDENT_AMBULATORY_CARE_PROVIDER_SITE_OTHER): Payer: 59

## 2019-04-14 DIAGNOSIS — Z23 Encounter for immunization: Secondary | ICD-10-CM

## 2019-04-24 DIAGNOSIS — N411 Chronic prostatitis: Secondary | ICD-10-CM | POA: Diagnosis not present

## 2019-04-24 DIAGNOSIS — E291 Testicular hypofunction: Secondary | ICD-10-CM | POA: Diagnosis not present

## 2019-04-27 ENCOUNTER — Encounter: Payer: Self-pay | Admitting: Family Medicine

## 2019-04-27 ENCOUNTER — Ambulatory Visit: Payer: 59 | Admitting: Family Medicine

## 2019-04-27 ENCOUNTER — Other Ambulatory Visit: Payer: Self-pay

## 2019-04-27 ENCOUNTER — Ambulatory Visit: Payer: Self-pay

## 2019-04-27 VITALS — BP 110/68 | HR 67 | Ht 72.0 in | Wt 206.0 lb

## 2019-04-27 DIAGNOSIS — M1812 Unilateral primary osteoarthritis of first carpometacarpal joint, left hand: Secondary | ICD-10-CM | POA: Diagnosis not present

## 2019-04-27 DIAGNOSIS — M79642 Pain in left hand: Secondary | ICD-10-CM

## 2019-04-27 NOTE — Assessment & Plan Note (Signed)
Repeat injection given today.  Discussed the possibility of PRP.  Discussed icing regimen and home exercise, discussed avoiding certain activities.  Patient should increase activity slowly.  Follow-up again 6 to 12 weeks.

## 2019-04-27 NOTE — Progress Notes (Signed)
[name] [address] [place], [place] [address] Phone: [phone] Subjective:   [name], am serving as a scribe for Dr. Hulan Saas.  CC: Hand pain  [id number]  Erik [name] is a 63 y.o. male coming in with complaint of back pain. Last seen on 107/2020 for virtual visit. Epidural on 03/28/2019. Patient states that he is here for [facility] joint pain. Pain intensified 3 weeks ago. Having a hard time pulling on his socks. Has had injection prior and has done well with it in the past.    Past Medical History:  Diagnosis Date  . ALLERGIC RHINITIS 12/19/2009  . Allergy   . Back pain chronic    On hydrocodone-Neurontin  . Blepharitis    B, chronic  . BPH (benign prostatic hyperplasia)   . Chronic prostatitis     f/u by urology, has a prostate massage prn  . GERD (gastroesophageal reflux disease)   . Groin injury   . HYPOGONADISM, MALE 08/11/2007   f/u by Dr Thomasene Mohair urology    . OSTEOPOROSIS, IDIOPATHIC 08/11/2007   f/u at [facility] , now improved to osteopenia  . Raynaud's disease /phenomenon 08/16/2012  . Rosacea   . Shingles 09-2014  . Varicose veins    Past Surgical History:  Procedure Laterality Date  . BLEPHAROPLASTY     B blepharoplasty @ [facility] 08-2011  . CERVICAL DISCECTOMY  1998  . CERVICAL FUSION  1994   C4-C5-C6 fusion  . COLONOSCOPY    . EYE SURGERY     eyelid surgery on both eyes  . HERNIA REPAIR Left 1979  . IR GENERIC HISTORICAL  03/17/2016   IR EMBO VENOUS NOT HEMORR HEMANG  INC GUIDE ROADMAPPING 03/17/2016 Greggory Keen, MD [facility] ULTRASOUND  . KNEE ARTHROSCOPY    . POLYPECTOMY    . SHOULDER SURGERY Right    early 2000s  . SIGMOIDOSCOPY    . TRANSURETHRAL RESECTION OF PROSTATE  07-2009   Social History   Socioeconomic History  . Marital status: Married    Spouse name: Not on file  . Number of children: 3  . Years of education: Not on file  . Highest education level: Not on file  Occupational History  .  Occupation: [employer]   Social Needs  . Financial resource strain: Not on file  . Food insecurity    Worry: Not on file    Inability: Not on file  . Transportation needs    Medical: Not on file    Non-medical: Not on file  Tobacco Use  . Smoking status: Never Smoker  . Smokeless tobacco: Never Used  Substance and Sexual Activity  . Alcohol use: No    Alcohol/week: 0.0 standard drinks  . Drug use: No  . Sexual activity: Not on file  Lifestyle  . Physical activity    Days per week: Not on file    Minutes per session: Not on file  . Stress: Not on file  Relationships  . Social Herbalist on phone: Not on file    Gets together: Not on file    Attends religious service: Not on file    Active member of club or organization: Not on file    Attends meetings of clubs or organizations: Not on file    Relationship status: Not on file  Other Topics Concern  . Not on file  Social History Narrative   Lives with wife.  They have three children.  Daughter is an Ship broker     Allergies  Allergen Reactions  . Sulfa Drugs Cross Reactors Nausea And Vomiting   Family History  Problem Relation Age of Onset  . COPD Father        Died, 71  . Hypertension Father   . Stroke Father   . CAD Father   . Allergic rhinitis Father   . Diabetes Mother   . Hypertension Mother   . CAD Mother           . Colon polyps Mother   . Cancer Mother        Ovarian cancer  . Hypertension Sister   . High Cholesterol Sister   . Colon cancer Maternal Aunt   . Prostate cancer Neg Hx   . Esophageal cancer Neg Hx   . Rectal cancer Neg Hx   . Stomach cancer Neg Hx   . Pancreatic cancer Neg Hx   . Angioedema Neg Hx   . Asthma Neg Hx   . Eczema Neg Hx   . Immunodeficiency Neg Hx   . Urticaria Neg Hx     Current Outpatient Medications (Endocrine & Metabolic):  .  testosterone cypionate (DEPOTESTOTERONE CYPIONATE) 100 MG/ML injection, Inject into the muscle every 14 (fourteen) days. For  IM use only  Current Outpatient Medications (Cardiovascular):  .  amLODipine (NORVASC) 2.5 MG tablet, Take 1 tablet (2.5 mg total) by mouth daily. .  sildenafil (REVATIO) 20 MG tablet, TAKE 1 TABLET (20 MG TOTAL) BY MOUTH AS NEEDED FOR UP TO 30 DAYS. Marland Kitchen  tadalafil (CIALIS) 5 MG tablet, Take 5 mg by mouth daily.  Current Outpatient Medications (Respiratory):  .  azelastine (ASTELIN) 0.1 % nasal spray, Place 2 sprays into both nostrils at bedtime as needed for rhinitis. Use in each nostril as directed .  cromolyn (NASALCROM) 5.2 MG/ACT nasal spray, Place 1 spray into both nostrils 4 (four) times daily. Marland Kitchen  ipratropium (ATROVENT) 0.03 % nasal spray, Use 2 sprays per nostril 20 minutes before eating. May use 2 sprays per nostril 3 times a day. .  loratadine (CLARITIN) 10 MG tablet, Take 10 mg by mouth daily as needed for allergies.  Current Outpatient Medications (Analgesics):  .  HYDROcodone-acetaminophen (NORCO/VICODIN) 5-325 MG tablet, Take 1 tablet by mouth every 6 (six) hours as needed for moderate pain or severe pain.   Current Outpatient Medications (Other):  .  gabapentin (NEURONTIN) 300 MG capsule, TAKE 1 CAPSULE (300 MG TOTAL) BY MOUTH AT BEDTIME. Marland Kitchen  lidocaine (XYLOCAINE) 5 % ointment, Apply 1 application topically as needed. .  Multiple Vitamin (MULTI-VITAMINS) TABS, Take by mouth. .  Omega-3 Fatty Acids (FISH OIL) 1000 MG CAPS, Take 1 capsule by mouth 2 (two) times daily. Marland Kitchen  omeprazole (PRILOSEC) 40 MG capsule, Use 1 capsule once or twice a day for heartburn and reflux .  OVER THE COUNTER MEDICATION, Take 1 tablet by mouth 2 (two) times daily. AREDS 2    Past medical history, social, surgical and family history all reviewed in electronic medical record.  No pertanent information unless stated regarding to the chief complaint.   Review of Systems:  No headache, visual changes, nausea, vomiting, diarrhea, constipation, dizziness, abdominal pain, skin rash, fevers, chills, night  sweats, weight loss, swollen lymph nodes, body aches, joint swelling,  chest pain, shortness of breath, mood changes.  Positive muscle aches  Objective  Blood pressure 110/68, pulse 67, height 6' (1.829 m), weight 206 lb (93.4 kg), SpO2 98 %.  General: No apparent distress alert and oriented x3 mood and affect normal, dressed appropriately.  HEENT: Pupils equal, extraocular movements intact  Respiratory: Patient's speak in full sentences and does not appear short of breath  Cardiovascular: No lower extremity edema, non tender, no erythema  Skin: Warm dry intact with no signs of infection or rash on extremities or on axial skeleton.  Abdomen: Soft nontender  Neuro: Cranial nerves II through XII are intact, neurovascularly intact in all extremities with 2+ DTRs and 2+ pulses.  Lymph: No lymphadenopathy of posterior or anterior cervical chain or axillae bilaterally.  Gait normal with good balance and coordination.  MSK:  Non tender with full range of motion and good stability and symmetric strength and tone of shoulders, elbows, hip, knee and ankles bilaterally.  Left hand exam shows a patient with some tenderness to palpation around the [facility] joint.  Positive grind test.  Mild trigger nodule at the A1 pulley of the thumb noted.  Good grip strength.  Procedure: Real-time Ultrasound Guided Injection of left [facility] joint Device: GE Logiq Q7 Ultrasound guided injection is preferred based studies that show increased duration, increased effect, greater accuracy, decreased procedural pain, increased response rate, and decreased cost with ultrasound guided versus blind injection.  Verbal informed consent obtained.  Time-out conducted.  Noted no overlying erythema, induration, or other signs of local infection.  Skin prepped in a sterile fashion.  Local anesthesia: Topical Ethyl chloride.  With sterile technique and under real time ultrasound guidance: With a 25-gauge half inch needle injected with 0.5 cc  of 0.5% Marcaine and 0.5 cc of Kenalog 40 mg/mL Completed without difficulty  Pain immediately resolved suggesting accurate placement of the medication.  Advised to call if fevers/chills, erythema, induration, drainage, or persistent bleeding.  Images permanently stored and available for review in the ultrasound unit.  Impression: Technically successful ultrasound guided injection.     Impression and Recommendations:     This case required medical decision making of moderate complexity. The above documentation has been reviewed and is accurate and complete Lyndal Pulley, DO       Note: This dictation was prepared with Dragon dictation along with smaller phrase technology. Any transcriptional errors that result from this process are unintentional.

## 2019-04-27 NOTE — Patient Instructions (Signed)
Injected [facility] joint today See me

## 2019-05-04 ENCOUNTER — Encounter: Payer: Self-pay | Admitting: Rheumatology

## 2019-05-04 NOTE — Progress Notes (Signed)
Office Visit Note  Patient: Erik [name]             Date of Birth: May 25, 1956           MRN: [id number]             PCP: Colon Branch, MD Referring: Colon Branch, MD Visit Date: 05/05/2019 Occupation: @GUAROCC @  Subjective:  Left CMC joint pain   History of Present Illness: JAMESJOSEPH [name] is a 63 y.o. male with history of Raynaud's, DDD, and osteoarthritis.  His symptoms of Raynaud's have been more very well controlled.  He takes Norvasc 2.5 mg by mouth daily during the winter months.  He requested a refill of Norvasc today.  He denies any digital ulcerations or signs of gangrene. He reports that he continues to have persistent left CMC joint pain and inflammation.  He is followed by Dr. Tamala Julian and has had 2 cortisone injections this year.  He has not tried wearing a CMC joint brace.  He has had some decreased grip strength in the left hand.  He takes Tylenol and Aleve as needed for pain relief which have been ineffective.  He denies any other joint pain or joint swelling at this time. He is scheduled to update DEXA in February 2020.  He is taking calcium and vitamin D supplements.    Activities of Daily Living:  Patient reports morning stiffness for 0 NONE.   Patient Denies nocturnal pain.  Difficulty dressing/grooming: Reports Difficulty climbing stairs: Denies Difficulty getting out of chair: Denies Difficulty using hands for taps, buttons, cutlery, and/or writing: Denies  Review of Systems  Constitutional: Negative for fatigue and night sweats.  HENT: Negative for mouth sores, mouth dryness and nose dryness.   Eyes: Positive for dryness. Negative for redness.  Respiratory: Negative for cough, hemoptysis, shortness of breath and difficulty breathing.   Cardiovascular: Negative for chest pain, palpitations, hypertension, irregular heartbeat and swelling in legs/feet.  Gastrointestinal: Negative for blood in stool, constipation and diarrhea.  Endocrine: Negative for  excessive thirst and increased urination.  Genitourinary: Negative for difficulty urinating and painful urination.  Musculoskeletal: Positive for arthralgias, joint pain and joint swelling. Negative for myalgias, muscle weakness, morning stiffness, muscle tenderness and myalgias.  Skin: Negative for color change, rash, hair loss, nodules/bumps, skin tightness, ulcers and sensitivity to sunlight.  Allergic/Immunologic: Negative for susceptible to infections.  Neurological: Negative for dizziness, fainting, memory loss, night sweats and weakness.  Hematological: Negative for bruising/bleeding tendency and swollen glands.  Psychiatric/Behavioral: Negative for depressed mood and sleep disturbance. The patient is not nervous/anxious.     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 10/01/2014   Herpes zoster-- 09-2014 09/05/2014   Anal fissure 02/28/2014   Chronic prostatitis 02/28/2014   Abnormal prostate specific antigen 02/28/2014   Gastroesophageal reflux disease without esophagitis 02/28/2014   Ganglion of tendon 02/28/2014   Incomplete bladder emptying 02/28/2014   FOM (frequency of micturition) 02/28/2014   Nonexudative age-related macular degeneration 02/12/2014  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   Annual physical exam 10/21/2011   Neck -- back pain- UDS 09/18/2011   Other allergic rhinitis 12/19/2009   PELVIC PAIN, CHRONIC  04/03/2009   Hypogonadism -- per urology 08/11/2007   OSTEOPOROSIS, IDIOPATHIC 08/11/2007   HEADACHE 08/11/2007    Past Medical History:  Diagnosis Date   ALLERGIC RHINITIS 12/19/2009   Allergy    Back pain chronic    On hydrocodone-Neurontin   Blepharitis    B, chronic   BPH (benign prostatic hyperplasia)    Chronic prostatitis     f/u by urology, has a prostate massage prn   GERD (gastroesophageal reflux disease)    Groin injury    HYPOGONADISM, MALE 08/11/2007   f/u by Dr Thomasene Mohair urology     OSTEOPOROSIS, IDIOPATHIC 08/11/2007   f/u at [facility] , now improved to osteopenia   Raynaud's disease /phenomenon 08/16/2012   Rosacea    Shingles 09-2014   Varicose veins     Family History  Problem Relation Age of Onset   COPD Father        Died, 21   Hypertension Father    Stroke Father    CAD Father    Allergic rhinitis Father    Diabetes Mother    Hypertension Mother    CAD Mother            Colon polyps Mother    Cancer Mother        Ovarian cancer   Hypertension Sister    High Cholesterol Sister    Colon cancer Maternal Aunt    Prostate cancer Neg Hx    Esophageal cancer Neg Hx    Rectal cancer Neg Hx    Stomach cancer Neg Hx    Pancreatic cancer Neg Hx    Angioedema Neg Hx    Asthma Neg Hx    Eczema Neg Hx    Immunodeficiency Neg Hx    Urticaria Neg Hx    Past Surgical History:  Procedure Laterality Date   BLEPHAROPLASTY     B blepharoplasty @ [facility] 08-2011   [place]   C4-C5-C6 fusion   COLONOSCOPY     EYE SURGERY     eyelid surgery on both eyes   HERNIA REPAIR Left 1979   IR GENERIC HISTORICAL  03/17/2016   IR EMBO VENOUS NOT HEMORR HEMANG  INC GUIDE ROADMAPPING 03/17/2016 Greggory Keen, MD [facility] ULTRASOUND   KNEE ARTHROSCOPY     POLYPECTOMY     SHOULDER SURGERY Right    early 2000s   SIGMOIDOSCOPY     TRANSURETHRAL RESECTION OF PROSTATE  07-2009   Social History    Social History Narrative   Lives with wife.  They have three children.   Daughter is an Engineer, maintenance (IT) History  Administered Date(s) Administered   Fluad Quad(high Dose 65+) 04/14/2019   H1N1 07/09/2008   Hepatitis A 10/12/2007, 04/12/2008   Hepatitis B 10/12/2007, 11/07/2007, 03/26/2008   Influenza Split 04/23/2011   Influenza Whole 04/12/2008, 04/03/2009   Influenza,inj,Quad PF,6+ Mos 04/24/2013, 05/08/2014, 04/12/2015, 03/30/2016, 04/23/2017, 04/15/2018   Td 07/06/2001   Tdap 10/21/2011   Zoster 04/24/2013   Zoster Recombinat (Shingrix) 06/25/2017, 09/03/2017     Objective: Vital Signs: BP 117/81 (BP Location: Left Arm, Patient Position: Sitting, Cuff Size: Normal)    Pulse 77    Resp 14    Ht 6' (1.829 m)    Wt 208  lb 9.6 oz (94.6 kg)    BMI 28.29 kg/m    Physical Exam Vitals signs and nursing note reviewed.  Constitutional:      Appearance: He is well-developed.  HENT:     Head: Normocephalic and atraumatic.  Eyes:     Conjunctiva/sclera: Conjunctivae normal.     Pupils: Pupils are equal, round, and reactive to light.  Neck:     Musculoskeletal: Normal range of motion and neck supple.  Cardiovascular:     Rate and Rhythm: Normal rate and regular rhythm.     Heart sounds: Normal heart sounds.  Pulmonary:     Effort: Pulmonary effort is normal.     Breath sounds: Normal breath sounds.  Abdominal:     General: Bowel sounds are normal.     Palpations: Abdomen is soft.  Skin:    General: Skin is warm and dry.     Capillary Refill: Capillary refill takes less than 2 seconds.  Neurological:     Mental Status: He is alert and oriented to person, place, and time.  Psychiatric:        Behavior: Behavior normal.      Musculoskeletal Exam: C-spine slightly limited range of motion.  Thoracic and lumbar spine good range of motion.  No midline spinal tenderness.  No SI joint tenderness.  Shoulder joints, elbows, wrist joints, MCPs, PIPs, DIPs  good range of motion with no synovitis.  He has left CMC joint thickening and tenderness on exam.  He has tenderness of the left first MCP joint.  Hip joints, knee joints, ankle joints, MTPs, PIPs and DIPs good range of motion no synovitis.  No warmth or effusion of bilateral knee joints.  No tenderness or swelling of ankle joints.  CDAI Exam: CDAI Score: -- Patient Global: --; Provider Global: -- Swollen: 0 ; Tender: 2  Joint Exam      Right  Left  CMC      Tender  MCP 1      Tender     Investigation: No additional findings.  Imaging: [place] Limited Joint Space Structures Up Left  Result Date: 04/28/2019 Procedure: Real-time Ultrasound Guided Injection of left CMC joint Device: GE Logiq Q7 Ultrasound guided injection is preferred based studies that show increased duration, increased effect, greater accuracy, decreased procedural pain, increased response rate, and decreased cost with ultrasound guided versus blind injection. Verbal informed consent obtained. Time-out conducted. Noted no overlying erythema, induration, or other signs of local infection. Skin prepped in a sterile fashion. Local anesthesia: Topical Ethyl chloride. With sterile technique and under real time ultrasound guidance: With a 25-gauge half inch needle injected with 0.5 cc of 0.5% Marcaine and 0.5 cc of Kenalog 40 mg/mL Completed without difficulty Pain immediately resolved suggesting accurate placement of the medication. Advised to call if fevers/chills, erythema, induration, drainage, or persistent bleeding. Images permanently stored and available for review in the ultrasound unit. Impression: Technically successful ultrasound guided injection.    Recent Labs: Lab Results  Component Value Date   WBC 3.7 (L) 09/20/2017   HGB 15.2 09/20/2017   PLT 184.0 09/20/2017   NA 140 06/10/2018   K 4.5 06/10/2018   CL 103 06/10/2018   CO2 31 06/10/2018   GLUCOSE 99 06/10/2018   BUN 25 (H) 06/10/2018   CREATININE 0.84  06/10/2018   BILITOT 1.3 (H) 06/10/2018   ALKPHOS 60 06/10/2018   AST 18 06/10/2018   ALT 20 06/10/2018   PROT 6.2 06/10/2018   ALBUMIN 4.4 06/10/2018  CALCIUM 9.3 06/10/2018   GFRAA 151 04/12/2008    Speciality Comments: No specialty comments available.  Procedures:  No procedures performed Allergies: Sulfa drugs cross reactors   Assessment / Plan:     Visit Diagnoses: Raynaud's disease without gangrene: His symptoms of raynaud's have been well controlled recently.  He has slightly delayed capillary refill 2-3 seconds.  No digital ulcerations or signs of gangrene were noted. He takes Norvasc 2.5 mg 1 tablet daily during the winter months for symptomatic relief.  He requested a refill today.  We discussed the importance of keeping his core body temperature warm, wearing gloves and thick socks, and drinking warm fluids.  He was advised to notify us if he develops new or worsening symptoms.    Arthritis of carpometacarpal [facility]) joint of left thumb: He has left CMC joint synovial thickening and tenderness. No synovitis was noted on exam. He had a left CMC joint cortisone injection on 04/27/19 performed by Dr. Tamala Julian.  This was his second left CMC joint injection in 2020.  The first injection was effective but started to wear off after about 6 months.  He discussed PRP injections with Dr. Tamala Julian but is not ready to proceed with these injections at this time.  He has been taking tylenol and aleve as needed for pain relief.  He has not tried a [facility] joint brace yet. Joint protection and muscle strengthening were discussed.  Hand exercises were demonstrated today in the office. He was also given a handout of exercises to perform. He is concerned about underlying inflammation related to an autoimmune condition.  We will obtain the following lab work today: ANA, sed rate, RF, anti-CCP, and 14-3-3 eta.  He was advised to notify us if his joint swelling persists or worsens.   Degenerative cervical disc: He  has slightly limited ROM with no discomfort at this time. No symptoms of radiculopathy.   Degenerative lumbar disc: He has chronic lower back pain.  No midline spinal tenderness.  He gets steroid injections at [facility]ology.    Adhesive capsulitis of left shoulder; Resolved.  He has good ROM with no discomfort.    Paralysis of right common peroneal nerve  Age-related osteoporosis without current pathological fracture: According to the patient he is scheduled for a DEXA in February 2020.    Vitamin D deficiency: He is taking a vitamin D supplement.   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    Dispense:  90 tablet    Refill:  1    Face-to-face time spent with patient was  minutes. Greater than 50% of time was spent in counseling and coordination of care.  Follow-Up Instructions: Return in about 6 months (around 11/03/2019) for Raynaud's syndrome, Osteoarthritis, DDD.   Ofilia Neas, PA-C   I examined and evaluated the patient with Hazel Sams PA. The plan of care was discussed as noted above.  Bo Merino, MD  Note - This record has been created using Editor, commissioning.  Chart creation  errors have been sought, but may not always  have been located. Such creation errors do not reflect on  the standard of medical care.

## 2019-05-04 NOTE — Telephone Encounter (Signed)
Please a schedule an appointment for the patient.

## 2019-05-05 ENCOUNTER — Encounter: Payer: Self-pay | Admitting: Physician Assistant

## 2019-05-05 ENCOUNTER — Ambulatory Visit: Payer: 59 | Admitting: Physician Assistant

## 2019-05-05 ENCOUNTER — Other Ambulatory Visit: Payer: Self-pay

## 2019-05-05 VITALS — BP 117/81 | HR 77 | Resp 14 | Ht 72.0 in | Wt 208.6 lb

## 2019-05-05 DIAGNOSIS — M7502 Adhesive capsulitis of left shoulder: Secondary | ICD-10-CM | POA: Diagnosis not present

## 2019-05-05 DIAGNOSIS — M1812 Unilateral primary osteoarthritis of first carpometacarpal joint, left hand: Secondary | ICD-10-CM

## 2019-05-05 DIAGNOSIS — G5731 Lesion of lateral popliteal nerve, right lower limb: Secondary | ICD-10-CM

## 2019-05-05 DIAGNOSIS — Z872 Personal history of diseases of the skin and subcutaneous tissue: Secondary | ICD-10-CM

## 2019-05-05 DIAGNOSIS — H40023 Open angle with borderline findings, high risk, bilateral: Secondary | ICD-10-CM | POA: Diagnosis not present

## 2019-05-05 DIAGNOSIS — H353190 Nonexudative age-related macular degeneration, unspecified eye, stage unspecified: Secondary | ICD-10-CM

## 2019-05-05 DIAGNOSIS — M810 Age-related osteoporosis without current pathological fracture: Secondary | ICD-10-CM | POA: Diagnosis not present

## 2019-05-05 DIAGNOSIS — I7300 Raynaud's syndrome without gangrene: Secondary | ICD-10-CM | POA: Diagnosis not present

## 2019-05-05 DIAGNOSIS — M5030 Other cervical disc degeneration, unspecified cervical region: Secondary | ICD-10-CM | POA: Diagnosis not present

## 2019-05-05 DIAGNOSIS — E559 Vitamin D deficiency, unspecified: Secondary | ICD-10-CM | POA: Diagnosis not present

## 2019-05-05 DIAGNOSIS — M5136 Other intervertebral disc degeneration, lumbar region: Secondary | ICD-10-CM | POA: Diagnosis not present

## 2019-05-05 MED ORDER — AMLODIPINE BESYLATE 2.5 MG PO TABS: 2.5000 mg | ORAL_TABLET | Freq: Every day | ORAL | 1 refills | Status: DC

## 2019-05-05 MED FILL — AMLODIPINE 2.5 MG TABLET: 2.5 | 90 days supply | Qty: 90 | Fill #0

## 2019-05-05 NOTE — Patient Instructions (Signed)
Hand Exercises °Hand exercises can be helpful for almost anyone. These exercises can strengthen the hands, improve flexibility and movement, and increase blood flow to the hands. These results can make work and daily tasks easier. Hand exercises can be especially helpful for people who have joint pain from arthritis or have nerve damage from overuse (carpal tunnel syndrome). These exercises can also help people who have injured a hand. °Exercises °Most of these hand exercises are gentle stretching and motion exercises. It is usually safe to do them often throughout the day. Warming up your hands before exercise may help to reduce stiffness. You can do this with gentle massage or by placing your hands in warm water for 10-15 minutes. °It is normal to feel some stretching, pulling, tightness, or mild discomfort as you begin new exercises. This will gradually improve. Stop an exercise right away if you feel sudden, severe pain or your pain gets worse. Ask your health care provider which exercises are best for you. °Knuckle bend or "claw" fist °1. Stand or sit with your arm, hand, and all five fingers pointed straight up. Make sure to keep your wrist straight during the exercise. °2. Gently bend your fingers down toward your palm until the tips of your fingers are touching the top of your palm. Keep your big knuckle straight and just bend the small knuckles in your fingers. °3. Hold this position for __________ seconds. °4. Straighten (extend) your fingers back to the starting position. °Repeat this exercise 5-10 times with each hand. °Full finger fist °1. Stand or sit with your arm, hand, and all five fingers pointed straight up. Make sure to keep your wrist straight during the exercise. °2. Gently bend your fingers into your palm until the tips of your fingers are touching the middle of your palm. °3. Hold this position for __________ seconds. °4. Extend your fingers back to the starting position, stretching every  joint fully. °Repeat this exercise 5-10 times with each hand. °Straight fist °1. Stand or sit with your arm, hand, and all five fingers pointed straight up. Make sure to keep your wrist straight during the exercise. °2. Gently bend your fingers at the big knuckle, where your fingers meet your hand, and the middle knuckle. Keep the knuckle at the tips of your fingers straight and try to touch the bottom of your palm. °3. Hold this position for __________ seconds. °4. Extend your fingers back to the starting position, stretching every joint fully. °Repeat this exercise 5-10 times with each hand. °Tabletop °1. Stand or sit with your arm, hand, and all five fingers pointed straight up. Make sure to keep your wrist straight during the exercise. °2. Gently bend your fingers at the big knuckle, where your fingers meet your hand, as far down as you can while keeping the small knuckles in your fingers straight. Think of forming a tabletop with your fingers. °3. Hold this position for __________ seconds. °4. Extend your fingers back to the starting position, stretching every joint fully. °Repeat this exercise 5-10 times with each hand. °Finger spread °1. Place your hand flat on a table with your palm facing down. Make sure your wrist stays straight as you do this exercise. °2. Spread your fingers and thumb apart from each other as far as you can until you feel a gentle stretch. Hold this position for __________ seconds. °3. Bring your fingers and thumb tight together again. Hold this position for __________ seconds. °Repeat this exercise 5-10 times with each hand. °  Making circles °1. Stand or sit with your arm, hand, and all five fingers pointed straight up. Make sure to keep your wrist straight during the exercise. °2. Make a circle by touching the tip of your thumb to the tip of your index finger. °3. Hold for __________ seconds. Then open your hand wide. °4. Repeat this motion with your thumb and each finger on your  hand. °Repeat this exercise 5-10 times with each hand. °Thumb motion °1. Sit with your forearm resting on a table and your wrist straight. Your thumb should be facing up toward the ceiling. Keep your fingers relaxed as you move your thumb. °2. Lift your thumb up as high as you can toward the ceiling. Hold for __________ seconds. °3. Bend your thumb across your palm as far as you can, reaching the tip of your thumb for the small finger (pinkie) side of your palm. Hold for __________ seconds. °Repeat this exercise 5-10 times with each hand. °Grip strengthening ° °1. Hold a stress ball or other soft ball in the middle of your hand. °2. Slowly increase the pressure, squeezing the ball as much as you can without causing pain. Think of bringing the tips of your fingers into the middle of your palm. All of your finger joints should bend when doing this exercise. °3. Hold your squeeze for __________ seconds, then relax. °Repeat this exercise 5-10 times with each hand. °Contact a health care provider if: °· Your hand pain or discomfort gets much worse when you do an exercise. °· Your hand pain or discomfort does not improve within 2 hours after you exercise. °If you have any of these problems, stop doing these exercises right away. Do not do them again unless your health care provider says that you can. °Get help right away if: °· You develop sudden, severe hand pain or swelling. If this happens, stop doing these exercises right away. Do not do them again unless your health care provider says that you can. °This information is not intended to replace advice given to you by your health care provider. Make sure you discuss any questions you have with your health care provider. °Document Released: 06/03/2015 Document Revised: 10/13/2018 Document Reviewed: 06/23/2018 °Elsevier Patient Education © 2020 [employer] ° °

## 2019-05-08 ENCOUNTER — Encounter: Payer: Self-pay | Admitting: Internal Medicine

## 2019-05-08 ENCOUNTER — Other Ambulatory Visit: Payer: Self-pay

## 2019-05-08 ENCOUNTER — Ambulatory Visit (HOSPITAL_BASED_OUTPATIENT_CLINIC_OR_DEPARTMENT_OTHER)
Admission: RE | Admit: 2019-05-08 | Discharge: 2019-05-08 | Disposition: A | Discharge status: Home / Self Care | Payer: 59 | Source: Ambulatory Visit | Attending: Internal Medicine | Admitting: Internal Medicine

## 2019-05-08 ENCOUNTER — Ambulatory Visit: Payer: 59 | Admitting: Internal Medicine

## 2019-05-08 VITALS — BP 120/73 | HR 70 | Temp 97.1°F | Resp 16 | Ht 72.0 in | Wt 206.5 lb

## 2019-05-08 DIAGNOSIS — Z23 Encounter for immunization: Secondary | ICD-10-CM

## 2019-05-08 DIAGNOSIS — S20211S Contusion of right front wall of thorax, sequela: Secondary | ICD-10-CM | POA: Diagnosis not present

## 2019-05-08 DIAGNOSIS — S20211A Contusion of right front wall of thorax, initial encounter: Secondary | ICD-10-CM | POA: Diagnosis not present

## 2019-05-08 DIAGNOSIS — S2241XG Multiple fractures of ribs, right side, subsequent encounter for fracture with delayed healing: Secondary | ICD-10-CM | POA: Diagnosis not present

## 2019-05-08 DIAGNOSIS — S2241XA Multiple fractures of ribs, right side, initial encounter for closed fracture: Secondary | ICD-10-CM | POA: Diagnosis not present

## 2019-05-08 MED ORDER — LIDOCAINE 4 % EX PTCH: 1.0000 | MEDICATED_PATCH | Freq: Every day | CUTANEOUS | 1 refills | Status: DC

## 2019-05-08 MED FILL — SALONPAS LIDO GEL PATCH 6: 3 days supply | Qty: 6 | Fill #0

## 2019-05-08 NOTE — Progress Notes (Signed)
Pre visit review using our clinic review tool, if applicable. No additional management support is needed unless otherwise documented below in the visit note. 

## 2019-05-08 NOTE — Patient Instructions (Signed)
   STOP BY THE FIRST FLOOR:  get the XR   For pain: Try the patches sent to your pharmacy, 1 or 2 a day. You can continue taking occasionally Tylenol or ibuprofen, see instructions below. Call if not gradually better  Tylenol  500 mg OTC 2 tabs a day every 8 hours as needed for pain  IBUPROFEN (Advil or Motrin) 200 mg 2 tablets every 8 hours as needed for pain.  Always take it with food because may cause gastritis and ulcers.  If you notice nausea, stomach pain, change in the color of stools --->  Stop the medicine and let us know

## 2019-05-08 NOTE — Progress Notes (Signed)
Subjective:    Patient ID: Erik [name], male    DOB: 07-25-1955, 63 y.o.   MRN: [id number]  DOS:  05/08/2019 Type of visit - description: Acute 9 weeks ago he had a fall, landed on his right side, immediately developed  anterior chest wall pain. The next day went to [facility] urgent care, x-rays were done, DX with a contusion. At the time of the injury, there was no head or neck problem/injuries, no bleeding or open sores. He is here because he is no better.  Pain is there whenever he moves in any direction.   Review of Systems Denies abdominal pain per se No difficulty breathing or edema No cough  Past Medical History:  Diagnosis Date  . ALLERGIC RHINITIS 12/19/2009  . Allergy   . Back pain chronic    On hydrocodone-Neurontin  . Blepharitis    B, chronic  . BPH (benign prostatic hyperplasia)   . Chronic prostatitis     f/u by urology, has a prostate massage prn  . GERD (gastroesophageal reflux disease)   . Groin injury   . HYPOGONADISM, MALE 08/11/2007   f/u by Dr Thomasene Mohair urology    . OSTEOPOROSIS, IDIOPATHIC 08/11/2007   f/u at [facility] , now improved to osteopenia  . Raynaud's disease /phenomenon 08/16/2012  . Rosacea   . Shingles 09-2014  . Varicose veins     Past Surgical History:  Procedure Laterality Date  . BLEPHAROPLASTY     B blepharoplasty @ [facility] 08-2011  . CERVICAL DISCECTOMY  1998  . CERVICAL FUSION  1994   C4-C5-C6 fusion  . COLONOSCOPY    . EYE SURGERY     eyelid surgery on both eyes  . HERNIA REPAIR Left 1979  . IR GENERIC HISTORICAL  03/17/2016   IR EMBO VENOUS NOT HEMORR HEMANG  INC GUIDE ROADMAPPING 03/17/2016 Greggory Keen, MD [facility]TRASOUND  . KNEE ARTHROSCOPY    . POLYPECTOMY    . SHOULDER SURGERY Right    early 2000s  . SIGMOIDOSCOPY    . TRANSURETHRAL RESECTION OF PROSTATE  07-2009    Social History   Socioeconomic History  . Marital status: Married    Spouse name: Not on file  . Number of children: 3  . Years of education: Not on file   . Highest education level: Not on file  Occupational History  . Occupation: real state   Social Needs  . Financial resource strain: Not on file  . Food insecurity    Worry: Not on file    Inability: Not on file  . Transportation needs    Medical: Not on file    Non-medical: Not on file  Tobacco Use  . Smoking status: Never Smoker  . Smokeless tobacco: Never Used  Substance and Sexual Activity  . Alcohol use: No    Alcohol/week: 0.0 standard drinks  . Drug use: No  . Sexual activity: Not on file  Lifestyle  . Physical activity    Days per week: Not on file    Minutes per session: Not on file  . Stress: Not on file  Relationships  . Social Herbalist on phone: Not on file    Gets together: Not on file    Attends religious service: Not on file    Active member of club or organization: Not on file    Attends meetings of clubs or organizations: Not on file    Relationship status: Not on file  . Intimate partner  violence    Fear of current or ex partner: Not on file    Emotionally abused: Not on file    Physically abused: Not on file    Forced sexual activity: Not on file  Other Topics Concern  . Not on file  Social History Narrative   Lives with wife.  They have three children.   Daughter is an Ship broker        Allergies as of 05/08/2019      Reactions   Sulfa Drugs Cross Reactors Nausea And Vomiting      Medication List       Accurate as of May 08, 2019 11:15 AM. If you have any questions, ask your nurse or doctor.        amLODipine 2.5 MG tablet Commonly known as: Norvasc Take 1 tablet (2.5 mg total) by mouth daily.   B-D INTEGRA SYRINGE 23G X 1" 3 ML Misc Generic drug: SYRINGE-NEEDLE (DISP) 3 ML   Fish Oil 1000 MG Caps Take 1 capsule by mouth 2 (two) times daily.   gabapentin 300 MG capsule Commonly known as: NEURONTIN TAKE 1 CAPSULE (300 MG TOTAL) BY MOUTH AT BEDTIME.   lidocaine 5 % ointment Commonly known as: XYLOCAINE Apply 1  application topically as needed.   Multi-Vitamins Tabs Take by mouth.   omeprazole 40 MG capsule Commonly known as: PRILOSEC Use 1 capsule once or twice a day for heartburn and reflux   OVER THE COUNTER MEDICATION Take 1 tablet by mouth 2 (two) times daily. AREDS 2   sildenafil 20 MG tablet Commonly known as: REVATIO TAKE 1 TABLET (20 MG TOTAL) BY MOUTH AS NEEDED FOR UP TO 30 DAYS.   tadalafil 5 MG tablet Commonly known as: CIALIS Take 5 mg by mouth daily.   testosterone cypionate 100 MG/ML injection Commonly known as: DEPOTESTOTERONE CYPIONATE Inject into the muscle every 14 (fourteen) days. For IM use only           Objective:   Physical Exam Abdominal:      BP 120/73 (BP Location: Left Arm, Patient Position: Sitting, Cuff Size: Normal)   Pulse 70   Temp (!) 97.1 F (36.2 C) (Temporal)   Resp 16   Ht 6' (1.829 m)   Wt 206 lb 8 oz (93.7 kg)   SpO2 100%   BMI 28.01 kg/m  General:   Well developed, NAD, BMI noted. HEENT:  Normocephalic . Face symmetric, atraumatic Lungs:  CTA B Normal respiratory effort, no intercostal retractions, no accessory muscle use. Heart: RRR,  no murmur.  No pretibial edema bilaterally Abdomen: Soft, nontender, no organomegaly MSK: See graphic. Mild antalgic posture when he moves Skin: Not pale. Not jaundice Neurologic:  alert & oriented X3.  Speech normal, gait appropriate for age and unassisted Psych--  Cognition and judgment appear intact.  Cooperative with normal attention span and concentration.  Behavior appropriate. No anxious or depressed appearing.      Assessment     Assessment   Osteoporosis, idiopathic, follow-up at [facility]. Raynaud phenomena 2013--Dr Truslow, amlodipine prn. Now sees another rheumatologist MSK -- s/p Cervical fusion --Chronic back pain: used to be on Hydrocodone rx by pcp;  on gabapentin ) -- L shoulder adhesive capsulitis -- stress fracture L sesamoid (foot) dx 2018) GU: Dr. Thomasene Mohair,  urology --BPH, TURP 2011 --Chronic prostatitis --Hypogonadism, per urology Rosacea Varicose veins treatment @ [facility] OPHT: Glaucoma -Chronic blepharitis- ocular rosacea- Mac degeneration H/o Shingles 09-2014   PLAN Chest wall contusion: Based on  history, I agree with the diagnosis of chest wall contusion. Plan to recheck a x-ray, okay to continue taking Tylenol and ibuprofen with GI precautions. Will add a lidocaine patch as needed. If not improving, consider pain management referral.  Local injection?. Addendum: X-ray show 2 rib fractures.  Patient aware

## 2019-05-09 ENCOUNTER — Encounter: Payer: Self-pay | Admitting: Internal Medicine

## 2019-05-09 NOTE — Progress Notes (Signed)
Sed rate WNL.  Anti-CCP negative. RF negative.  ANA negative.

## 2019-05-09 NOTE — Assessment & Plan Note (Signed)
Chest wall contusion: Based on history, I agree with the diagnosis of chest wall contusion. Plan to recheck a x-ray, okay to continue taking Tylenol and ibuprofen with GI precautions. Will add a lidocaine patch as needed. If not improving, consider pain management referral.  Local injection?. Addendum: X-ray show 2 rib fractures.  Patient aware

## 2019-05-10 ENCOUNTER — Ambulatory Visit: Payer: 59

## 2019-05-11 LAB — 14-3-3 ETA PROTEIN: 14-3-3 eta Protein: <0.2 ng/mL (ref <0.2)

## 2019-05-11 LAB — ANA: Anti Nuclear Antibody (ANA): NEGATIVE

## 2019-05-11 LAB — CYCLIC CITRUL PEPTIDE ANTIBODY, IGG: Cyclic Citrullin Peptide Ab: <16 UNITS

## 2019-05-11 LAB — RHEUMATOID FACTOR: Rheumatoid fact SerPl-aCnc: <14 IU/mL (ref <14)

## 2019-05-11 LAB — SEDIMENTATION RATE: Sed Rate: 2 mm/h (ref 0–20)

## 2019-05-11 NOTE — Progress Notes (Signed)
14-3-3 eta.  Please notify patient that the lab results are not consistent with rheumatoid arthritis.

## 2019-05-22 DIAGNOSIS — H353132 Nonexudative age-related macular degeneration, bilateral, intermediate dry stage: Secondary | ICD-10-CM | POA: Diagnosis not present

## 2019-05-22 DIAGNOSIS — H40023 Open angle with borderline findings, high risk, bilateral: Secondary | ICD-10-CM | POA: Diagnosis not present

## 2019-05-22 DIAGNOSIS — H25813 Combined forms of age-related cataract, bilateral: Secondary | ICD-10-CM | POA: Diagnosis not present

## 2019-05-26 MED FILL — AZELAIC ACID 15 % GEL: 15 | 14 days supply | Qty: 50 | Fill #0

## 2019-06-03 IMAGING — US US INJEC SCLEROTHERAPY MULT
1 series · 8 of 8 positions shown · non-contrast
Comparison: none

INDICATION: Symptomatic right lower extremity varicose veins with thigh and calf
pain. History of saphenous venous insufficiency and prior
transcatheter laser occlusions.

[Series 1: us injec sclerotherapy mult · 0.04mm/px · 8 acquisitions, 8 frames shown]
[im 1/8]
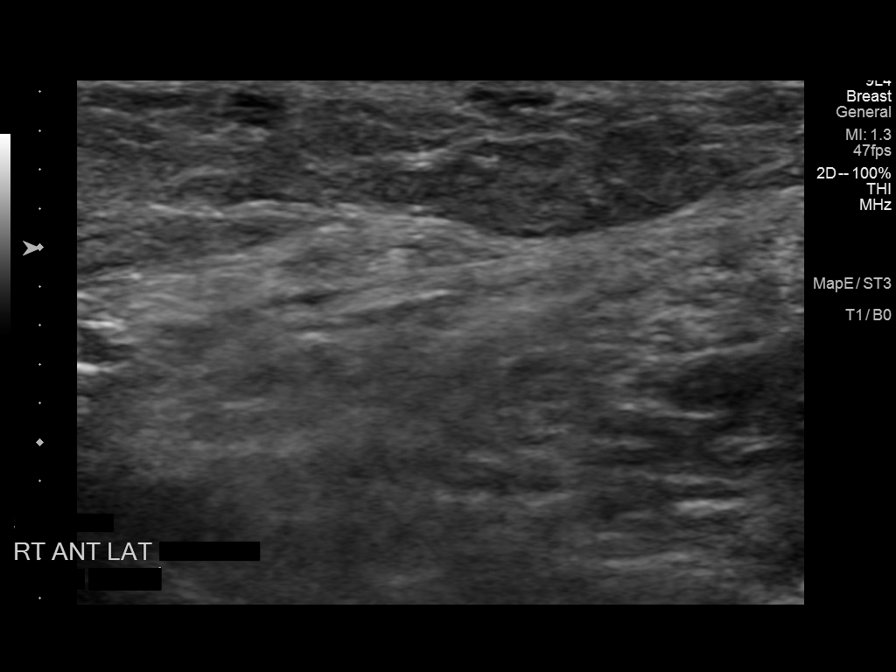
[im 2/8]
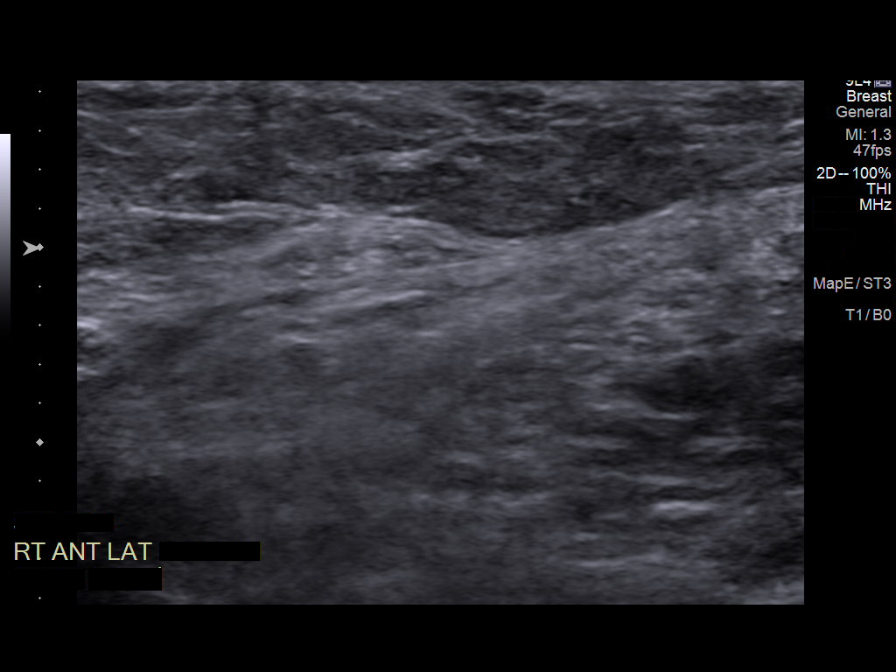
[im 3/8]
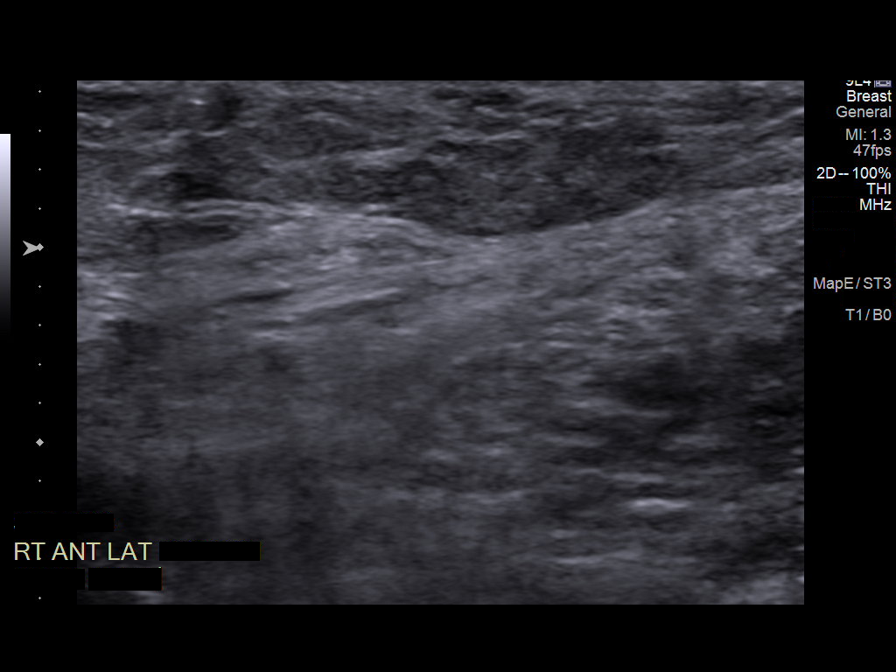
[im 4/8]
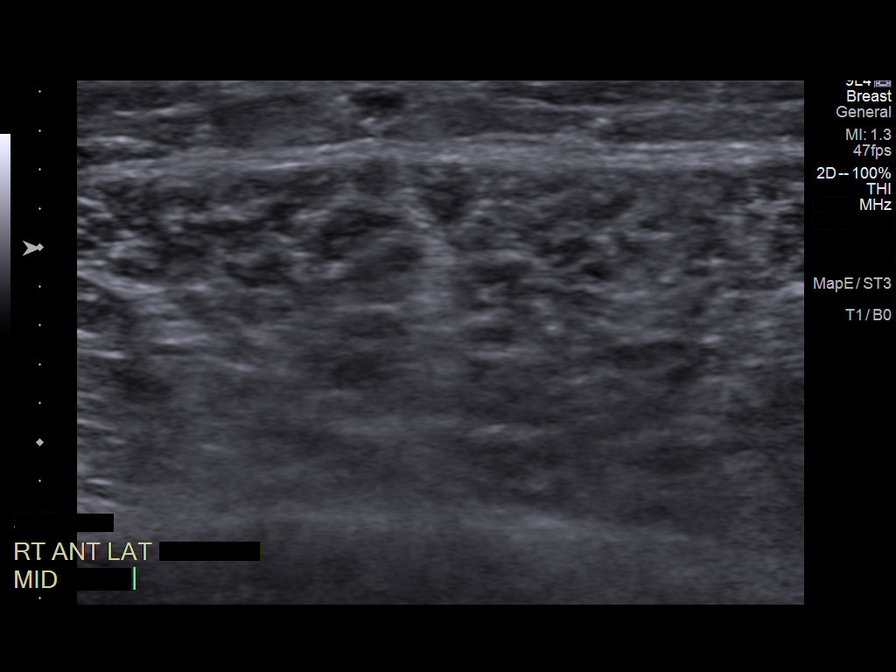
[im 5/8]
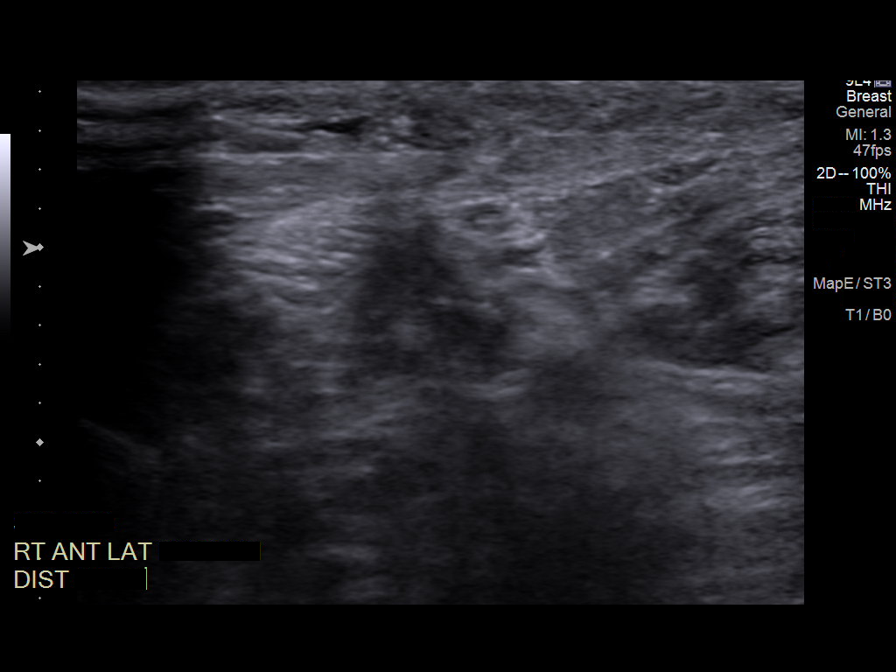
[im 6/8]
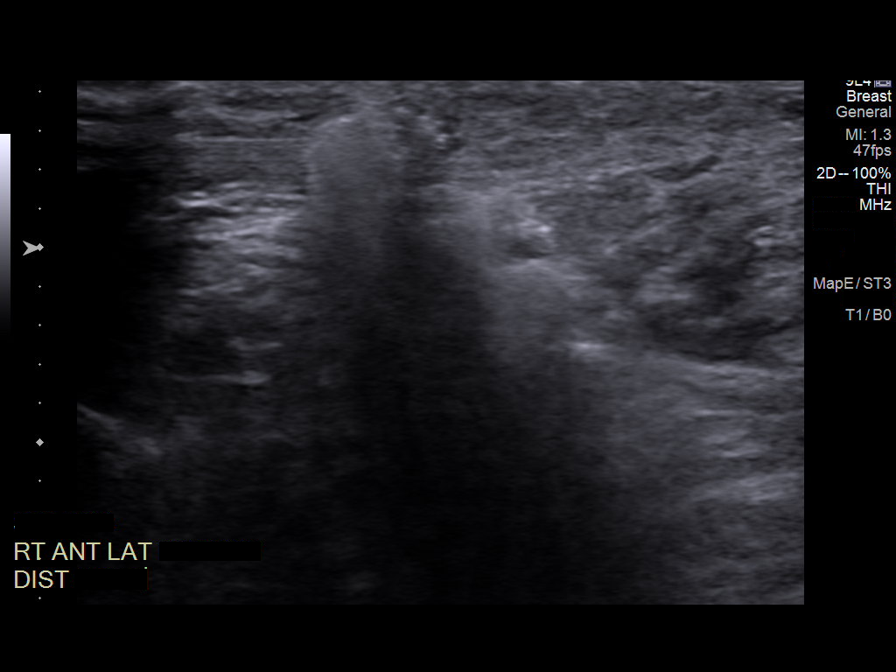
[im 7/8]
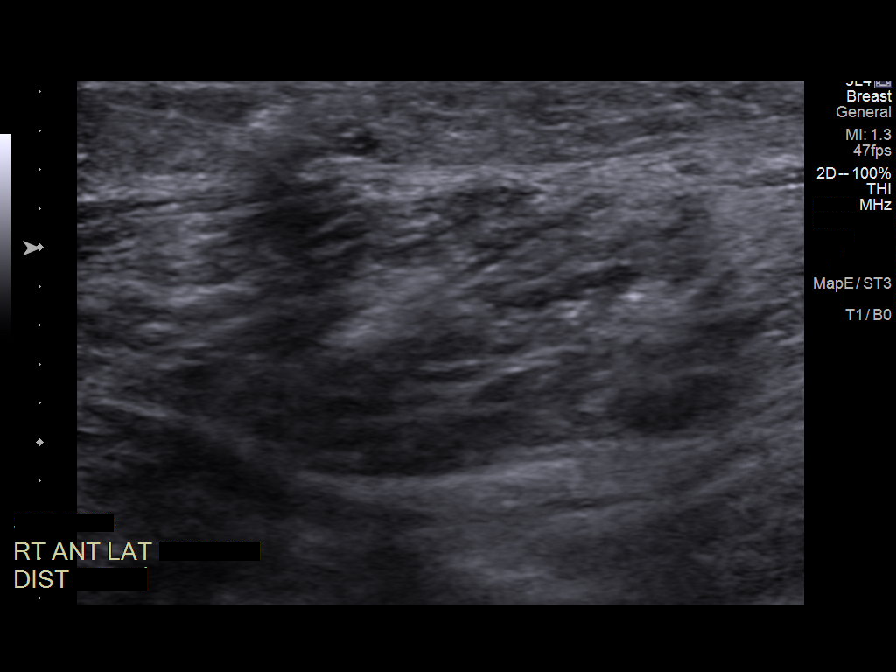
[im 8/8]
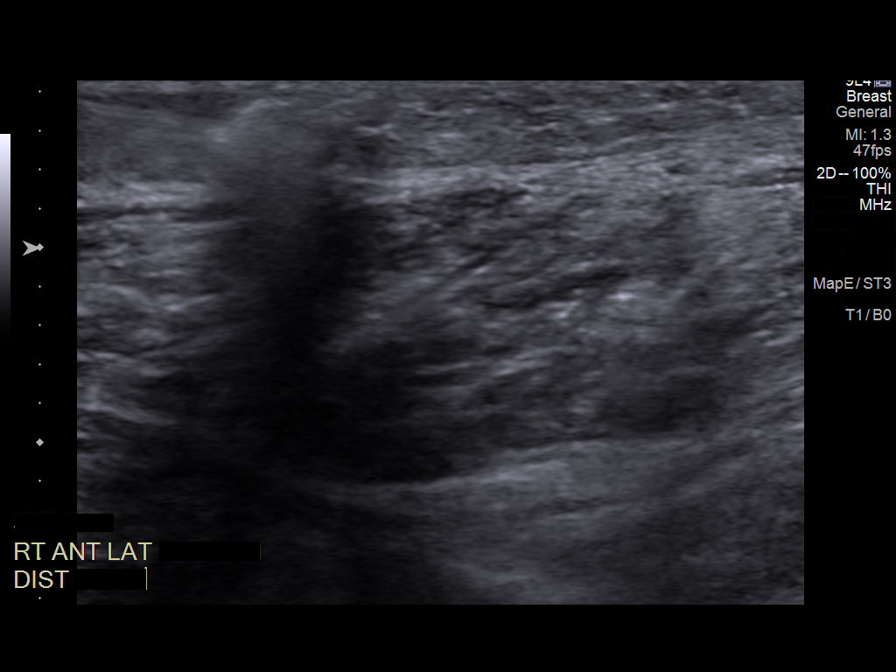

[8 of 8 positions shown; findings below may reference images not displayed]

EXAM:
ULTRASOUND INJECTION OF SCLEROSANT; MULTIPLE INCOMPETENT VEINS (SAME
LEG)

MEDICATIONS:
None.

ANESTHESIA/SEDATION:
None.

COMPLICATIONS:
None immediate.

PROCEDURE:
Informed written consent was obtained from the patient after a
thorough discussion of the procedural risks, benefits and
alternatives. All questions were addressed. Maximal Sterile Barrier
Technique was utilized including caps, mask, sterile gowns, sterile
gloves, sterile drape, hand hygiene and skin antiseptic. A timeout
was performed prior to the initiation of the procedure.

Preliminary vein mapping performed of the right lower extremity
thigh and calf region. Patient has undergone previous right GSV
transcatheter laser occlusion remotely. He has a small anterolateral
saphenous branch and a calf region GSV. These segments demonstrate
wall thickening and are small in caliber but do connect to the
developing thigh and calf as well as ankle varicosities.

Under sterile conditions and local anesthesia, tips remain to access
the recanalized calf GSV segment however this was unsuccessful in
passing a guidewire. A similar fashion the tips remain to access the
lower thigh region recanalized anterolateral saphenous branch. A
guidewire would not pass. Therefore transcatheter laser occlusion
could not be performed.

Varicosities were localized in the lower thigh, proximal, mid and
distal calf regions. These areas were marked. Ultrasound
sclerotherapy performed at 4 sites of residual sub surface
varicosities. A total of 2 cc foamed 1% Polidocanol all was injected
with good this bursal throughout the varicosities. Images obtained
for documentation.
FINDINGS: Persistent incompetent symptomatic varicose veins located thigh and
calf regions in the right lower extremity.

Injection of 2 mL sclerosant.
IMPRESSION: Technically successful foam sclerotherapy of residual symptomatic
right lower extremity varicose veins.

## 2019-06-12 DIAGNOSIS — N411 Chronic prostatitis: Secondary | ICD-10-CM | POA: Diagnosis not present

## 2019-06-12 DIAGNOSIS — E291 Testicular hypofunction: Secondary | ICD-10-CM | POA: Diagnosis not present

## 2019-06-13 ENCOUNTER — Other Ambulatory Visit: Payer: Self-pay

## 2019-06-14 ENCOUNTER — Ambulatory Visit (INDEPENDENT_AMBULATORY_CARE_PROVIDER_SITE_OTHER): Payer: 59 | Admitting: Internal Medicine

## 2019-06-14 ENCOUNTER — Encounter: Payer: Self-pay | Admitting: Internal Medicine

## 2019-06-14 ENCOUNTER — Other Ambulatory Visit: Payer: Self-pay

## 2019-06-14 ENCOUNTER — Ambulatory Visit (HOSPITAL_BASED_OUTPATIENT_CLINIC_OR_DEPARTMENT_OTHER)
Admission: RE | Admit: 2019-06-14 | Discharge: 2019-06-14 | Disposition: A | Discharge status: Home / Self Care | Payer: 59 | Source: Ambulatory Visit | Attending: Internal Medicine | Admitting: Internal Medicine

## 2019-06-14 VITALS — BP 110/66 | HR 74 | Temp 97.4°F | Resp 16 | Ht 72.0 in | Wt 205.0 lb

## 2019-06-14 DIAGNOSIS — Z8616 Personal history of COVID-19: Secondary | ICD-10-CM

## 2019-06-14 DIAGNOSIS — R0602 Shortness of breath: Secondary | ICD-10-CM | POA: Diagnosis not present

## 2019-06-14 DIAGNOSIS — Z8781 Personal history of (healed) traumatic fracture: Secondary | ICD-10-CM | POA: Diagnosis not present

## 2019-06-14 DIAGNOSIS — R0600 Dyspnea, unspecified: Secondary | ICD-10-CM

## 2019-06-14 DIAGNOSIS — Z8619 Personal history of other infectious and parasitic diseases: Secondary | ICD-10-CM | POA: Diagnosis not present

## 2019-06-14 DIAGNOSIS — Z0001 Encounter for general adult medical examination with abnormal findings: Secondary | ICD-10-CM | POA: Diagnosis not present

## 2019-06-14 DIAGNOSIS — Z0000 Encounter for general adult medical examination without abnormal findings: Secondary | ICD-10-CM

## 2019-06-14 DIAGNOSIS — R0609 Other forms of dyspnea: Secondary | ICD-10-CM

## 2019-06-14 NOTE — Assessment & Plan Note (Signed)
-  Td 2013 - Zostavax : 2014; s/p shingrex x 2 - pnm 13 05/2019 - had a flu shot -CCS:  Colonoscopy Dr Waverly Ferrari, first [place] 2004 (polyp) , then 01/02/2008 (tubular adenoma), then 10-2015:1 polyp, 5 years  -PSA per urology -Diet, exercise: He is doing well -Labs: CMP, FLP, CBC, TSH

## 2019-06-14 NOTE — Progress Notes (Signed)
Subjective:    Patient ID: Erik [name], male    DOB: March 07, 1956, 63 y.o.   MRN: [id number]  DOS:  06/14/2019 Type of visit - description: CPX  Status post   right ribs fracture after a fall 02-2019.  The area is a still sore when he walks his dogs or lay down, areas is still TTP.  The pain is not sharp is just a discomfort.  In September, his wife was + Covid, the patient got tested and it came back + 03/08/2019. He felt unwell for 24 hours and the symptoms quickly resolved.  Since he was diagnosed with Covid he has noted a decrease in his stamina, basically DOE with prolonged walking.  No associated palpitations, edema. No fever chills No wheezing He does have chest pain as described above, likely associated with the rib fracture.  Also since Covid his stools are loose, no blood in the stools.  Wt Readings from Last 3 Encounters:  06/14/19 205 lb (93 kg)  05/08/19 206 lb 8 oz (93.7 kg)  05/05/19 208 lb 9.6 oz (94.6 kg)    Review of Systems   Other than above, a 14 point review of systems is negative     Past Medical History:  Diagnosis Date  . ALLERGIC RHINITIS 12/19/2009  . Allergy   . Back pain chronic    On hydrocodone-Neurontin  . Blepharitis    B, chronic  . BPH (benign prostatic hyperplasia)   . Chronic prostatitis     f/u by urology, has a prostate massage prn  . GERD (gastroesophageal reflux disease)   . Groin injury   . HYPOGONADISM, MALE 08/11/2007   f/u by Dr Thomasene Mohair urology    . OSTEOPOROSIS, IDIOPATHIC 08/11/2007   f/u at [facility] , now improved to osteopenia  . Raynaud's disease /phenomenon 08/16/2012  . Rosacea   . Shingles 09-2014  . Varicose veins     Past Surgical History:  Procedure Laterality Date  . BLEPHAROPLASTY     B blepharoplasty @ [facility] 08-2011  . CERVICAL DISCECTOMY  1998  . CERVICAL FUSION  1994   C4-C5-C6 fusion  . COLONOSCOPY    . EYE SURGERY     eyelid surgery on both eyes  . HERNIA REPAIR Left 1979  . IR GENERIC HISTORICAL   03/17/2016   IR EMBO VENOUS NOT HEMORR HEMANG  INC GUIDE ROADMAPPING 03/17/2016 Greggory Keen, MD [facility]TRASOUND  . KNEE ARTHROSCOPY    . POLYPECTOMY    . SHOULDER SURGERY Right    early 2000s  . SIGMOIDOSCOPY    . TRANSURETHRAL RESECTION OF PROSTATE  07-2009   Family History  Problem Relation Age of Onset  . COPD Father        Died, 71  . Hypertension Father   . Stroke Father   . CAD Father   . Allergic rhinitis Father   . Diabetes Mother   . Hypertension Mother   . CAD Mother           . Colon polyps Mother   . Cancer Mother        Ovarian cancer  . Hypertension Sister   . High Cholesterol Sister   . Colon cancer Maternal Aunt   . Prostate cancer Neg Hx   . Esophageal cancer Neg Hx   . Rectal cancer Neg Hx   . Stomach cancer Neg Hx   . Pancreatic cancer Neg Hx   . Angioedema Neg Hx   . Asthma Neg Hx   .  Eczema Neg Hx   . Immunodeficiency Neg Hx   . Urticaria Neg Hx     Social History   Socioeconomic History  . Marital status: Married    Spouse name: Not on file  . Number of children: 3  . Years of education: Not on file  . Highest education level: Not on file  Occupational History  . Occupation: real state   Tobacco Use  . Smoking status: Never Smoker  . Smokeless tobacco: Never Used  Substance and Sexual Activity  . Alcohol use: No    Alcohol/week: 0.0 standard drinks  . Drug use: No  . Sexual activity: Not on file  Other Topics Concern  . Not on file  Social History Narrative   Lives with wife.  They have three children.   Daughter is an Ship broker     Social Determinants of Radio broadcast assistant Strain:   . Difficulty of Paying Living Expenses: Not on file  Food Insecurity:   . Worried About Charity fundraiser in the Last Year: Not on file  . Ran Out of Food in the Last Year: Not on file  Transportation Needs:   . Lack of Transportation (Medical): Not on file  . Lack of Transportation (Non-Medical): Not on file  Physical Activity:    . Days of Exercise per Week: Not on file  . Minutes of Exercise per Session: Not on file  Stress:   . Feeling of Stress : Not on file  Social Connections:   . Frequency of Communication with Friends and Family: Not on file  . Frequency of Social Gatherings with Friends and Family: Not on file  . Attends Religious Services: Not on file  . Active Member of Clubs or Organizations: Not on file  . Attends Archivist Meetings: Not on file  . Marital Status: Not on file  Intimate Partner Violence:   . Fear of Current or Ex-Partner: Not on file  . Emotionally Abused: Not on file  . Physically Abused: Not on file  . Sexually Abused: Not on file      Allergies as of 06/14/2019      Reactions   Sulfa Drugs Cross Reactors Nausea And Vomiting      Medication List       Accurate as of June 14, 2019 11:59 PM. If you have any questions, ask your nurse or doctor.        amLODipine 2.5 MG tablet Commonly known as: Norvasc Take 1 tablet (2.5 mg total) by mouth daily.   B-D INTEGRA SYRINGE 23G X 1" 3 ML Misc Generic drug: SYRINGE-NEEDLE (DISP) 3 ML   Fish Oil 1000 MG Caps Take 1 capsule by mouth 2 (two) times daily.   gabapentin 300 MG capsule Commonly known as: NEURONTIN TAKE 1 CAPSULE (300 MG TOTAL) BY MOUTH AT BEDTIME.   lidocaine 5 % ointment Commonly known as: XYLOCAINE Apply 1 application topically as needed.   Lidocaine 4 % Ptch Apply 1-2 patches topically daily.   Multi-Vitamins Tabs Take by mouth.   omeprazole 40 MG capsule Commonly known as: PRILOSEC Use 1 capsule once or twice a day for heartburn and reflux   OVER THE COUNTER MEDICATION Take 1 tablet by mouth 2 (two) times daily. AREDS 2   sildenafil 20 MG tablet Commonly known as: REVATIO TAKE 1 TABLET (20 MG TOTAL) BY MOUTH AS NEEDED FOR UP TO 30 DAYS.   tadalafil 5 MG tablet Commonly known as: CIALIS Take 5  mg by mouth daily.   testosterone cypionate 100 MG/ML injection Commonly known as:  DEPOTESTOTERONE CYPIONATE Inject into the muscle every 14 (fourteen) days. For IM use only           Objective:   Physical Exam Abdominal:      BP 110/66 (BP Location: Left Arm, Patient Position: Sitting, Cuff Size: Normal)   Pulse 74   Temp (!) 97.4 F (36.3 C) (Temporal)   Resp 16   Ht 6' (1.829 m)   Wt 205 lb (93 kg)   SpO2 98%   BMI 27.80 kg/m  General: Well developed, NAD, BMI noted Neck: No  thyromegaly  HEENT:  Normocephalic . Face symmetric, atraumatic Lungs:  CTA B Normal respiratory effort, no intercostal retractions, no accessory muscle use. Heart: RRR,  no murmur.  No pretibial edema bilaterally Chest wall: Slightly TTP at the right lateral side.  See graphic Abdomen:  Not distended, soft, non-tender. No rebound or rigidity.   Skin: Exposed areas without rash. Not pale. Not jaundice Neurologic:  alert & oriented X3.  Speech normal, gait appropriate for age and unassisted Strength symmetric and appropriate for age.  Psych: Cognition and judgment appear intact.  Cooperative with normal attention span and concentration.  Behavior appropriate. No anxious or depressed appearing.     Assessment     Assessment   Osteoporosis, idiopathic, follow-up at [facility]. Raynaud phenomena 2013; amlodipine prn.   MSK -- s/p Cervical fusion --Chronic back pain: used to be on Hydrocodone rx by pcp;  on gabapentin ) -- L shoulder adhesive capsulitis -- stress fracture L sesamoid (foot) dx 2018) GU: Dr. Thomasene Mohair, urology --BPH, TURP 2011 --Chronic prostatitis --Hypogonadism, per urology Rosacea Varicose veins treatment @ [facility] OPHT: Glaucoma -Chronic blepharitis- ocular rosacea- Mac degeneration H/o Shingles 09-2014   PLAN Here for CPX DOE: Having difficulty breathing with exertion for the last few months.  The patient thinks sxs  started after he was diagnosed with Covid.  His Covid case was very mild and he was just fatigue for 24 hours. Additionally, he  has some chest soreness due to 2 rib FX d/t a fall 02-2019 After walking 3 laps in the office, his O2 sat went from 98% to 94 and quickly to 95%. EKG today: NSR, unchanged from previous Chest x-ray today and labs to be done. If all normal, would recommend observation, try to stay active and rebuild his stamina.  Reassess in 3 months, sooner if needed.  Consider echo CT chest etc. RTC 3 months   This visit occurred during the SARS-CoV-2 public health emergency.  Safety protocols were in place, including screening questions prior to the visit, additional usage of staff PPE, and extensive cleaning of exam room while observing appropriate contact time as indicated for disinfecting solutions.

## 2019-06-14 NOTE — Progress Notes (Signed)
Pre visit review using our clinic review tool, if applicable. No additional management support is needed unless otherwise documented below in the visit note. 

## 2019-06-14 NOTE — Patient Instructions (Signed)
GO TO THE LAB : Get the blood work     GO TO THE FRONT DESK Schedule your next appointment for a checkup in 3 months    STOP BY THE FIRST FLOOR:  get the XR

## 2019-06-15 LAB — CBC WITH DIFFERENTIAL/PLATELET
Basophils Absolute: 0 10*3/uL (ref 0.0–0.1)
Basophils Relative: 1 % (ref 0.0–3.0)
Eosinophils Absolute: 0.1 10*3/uL (ref 0.0–0.7)
Eosinophils Relative: 1.5 % (ref 0.0–5.0)
HCT: 43.1 % (ref 39.0–52.0)
Hemoglobin: 14.6 g/dL (ref 13.0–17.0)
Lymphocytes Relative: 33.3 % (ref 12.0–46.0)
Lymphs Abs: 1.6 10*3/uL (ref 0.7–4.0)
MCHC: 33.9 g/dL (ref 30.0–36.0)
MCV: 90.6 fl (ref 78.0–100.0)
Monocytes Absolute: 0.4 10*3/uL (ref 0.1–1.0)
Monocytes Relative: 9.2 % (ref 3.0–12.0)
Neutro Abs: 2.6 10*3/uL (ref 1.4–7.7)
Neutrophils Relative %: 55 % (ref 43.0–77.0)
Platelets: 209 10*3/uL (ref 150.0–400.0)
RBC: 4.76 Mil/uL (ref 4.22–5.81)
RDW: 13.1 % (ref 11.5–15.5)
WBC: 4.7 10*3/uL (ref 4.0–10.5)

## 2019-06-15 LAB — LIPID PANEL
Cholesterol: 198 mg/dL (ref 0–200)
HDL: 42.8 mg/dL (ref >39.00)
LDL Cholesterol: 125 mg/dL — ABNORMAL HIGH (ref 0–99)
NonHDL: 155.41
Total CHOL/HDL Ratio: 5
Triglycerides: 151 mg/dL — ABNORMAL HIGH (ref 0.0–149.0)
VLDL: 30.2 mg/dL (ref 0.0–40.0)

## 2019-06-15 LAB — COMPREHENSIVE METABOLIC PANEL
ALT: 20 U/L (ref 0–53)
AST: 18 U/L (ref 0–37)
Albumin: 4.2 g/dL (ref 3.5–5.2)
Alkaline Phosphatase: 69 U/L (ref 39–117)
BUN: 18 mg/dL (ref 6–23)
CO2: 29 mEq/L (ref 19–32)
Calcium: 9 mg/dL (ref 8.4–10.5)
Chloride: 105 mEq/L (ref 96–112)
Creatinine, Ser: 0.74 mg/dL (ref 0.40–1.50)
GFR: 106.09 mL/min (ref >60.00)
Glucose, Bld: 87 mg/dL (ref 70–99)
Potassium: 3.9 mEq/L (ref 3.5–5.1)
Sodium: 142 mEq/L (ref 135–145)
Total Bilirubin: 0.8 mg/dL (ref 0.2–1.2)
Total Protein: 5.9 g/dL — ABNORMAL LOW (ref 6.0–8.3)

## 2019-06-15 LAB — TSH: TSH: 2.12 u[IU]/mL (ref 0.35–4.50)

## 2019-06-15 NOTE — Assessment & Plan Note (Signed)
Here for CPX DOE: Having difficulty breathing with exertion for the last few months.  The patient thinks sxs  started after he was diagnosed with Covid.  His Covid case was very mild and he was just fatigue for 24 hours. Additionally, he has some chest soreness due to 2 rib FX d/t a fall 02-2019 After walking 3 laps in the office, his O2 sat went from 98% to 94 and quickly to 95%. EKG today: NSR, unchanged from previous Chest x-ray today and labs to be done. If all normal, would recommend observation, try to stay active and rebuild his stamina.  Reassess in 3 months, sooner if needed.  Consider echo CT chest etc. RTC 3 months

## 2019-06-19 MED FILL — GABAPENTIN 300 MG CAPSULE: 300 | 90 days supply | Qty: 90 | Fill #1

## 2019-06-19 MED FILL — OMEPRAZOLE 40 MG CPDR: 40 | 30 days supply | Qty: 60 | Fill #4

## 2019-06-20 MED FILL — BD INTEGRA SYRN 3 ML 23GX1: 23G X 1" | 84 days supply | Qty: 6 | Fill #2

## 2019-06-20 MED FILL — BD INTEGRA SYRN 3 ML 23GX1": 23G X 1" | 84 days supply | Qty: 6 | Fill #2

## 2019-06-21 MED FILL — TESTOSTERONE CYP 200 MG/ML: 200 | 84 days supply | Qty: 6 | Fill #0

## 2019-07-03 ENCOUNTER — Other Ambulatory Visit: Payer: Self-pay | Admitting: Physician Assistant

## 2019-07-10 ENCOUNTER — Other Ambulatory Visit: Payer: Self-pay | Admitting: Physician Assistant

## 2019-07-14 MED FILL — LIDOCAINE 3% CREAM: 3 | 30 days supply | Qty: 85 | Fill #0

## 2019-07-17 MED FILL — LIDOCAINE 3% CREAM: 3 | 30 days supply | Qty: 85 | Fill #0

## 2019-07-26 DIAGNOSIS — N411 Chronic prostatitis: Secondary | ICD-10-CM | POA: Diagnosis not present

## 2019-07-26 DIAGNOSIS — E291 Testicular hypofunction: Secondary | ICD-10-CM | POA: Diagnosis not present

## 2019-08-11 ENCOUNTER — Encounter: Payer: Self-pay | Admitting: Internal Medicine

## 2019-08-11 ENCOUNTER — Ambulatory Visit (INDEPENDENT_AMBULATORY_CARE_PROVIDER_SITE_OTHER): Payer: PPO | Admitting: Internal Medicine

## 2019-08-11 VITALS — Ht 72.0 in

## 2019-08-11 DIAGNOSIS — R197 Diarrhea, unspecified: Secondary | ICD-10-CM | POA: Diagnosis not present

## 2019-08-11 NOTE — Progress Notes (Signed)
Pre visit review using our clinic review tool, if applicable. No additional management support is needed unless otherwise documented below in the visit note. 

## 2019-08-11 NOTE — Progress Notes (Signed)
Subjective:    Patient ID: Erik [name], male    DOB: 1955/12/14, 64 y.o.   MRN: [id number]  DOS:  08/11/2019 Type of visit - description: Virtual Visit via Video Note  I connected with the above patient  by a video enabled telemedicine application and verified that I am speaking with the correct person using two identifiers.   THIS ENCOUNTER IS A VIRTUAL VISIT DUE TO COVID-19 - PATIENT WAS NOT SEEN IN THE OFFICE. PATIENT HAS CONSENTED TO VIRTUAL VISIT / TELEMEDICINE VISIT   Location of patient: home  Location of provider: office  I discussed the limitations of evaluation and management by telemedicine and the availability of in person appointments. The patient expressed understanding and agreed to proceed.  Acute Yesterday, for 4 hours had severe right-sided abdominal pain mostly in the lower aspect.  Is described as cramping. 3 hours after it subsided he developed diarrhea, initially loose stools and then just watery. Had multiple episodes.  No blood in the stools that he can tell.  He took Imodium and subsequently the diarrhea slowed down.  Today he feels tired, he is following a bland diet, no bowel movements today.  Still has some mild residual right lower quadrant abdominal discomfort  Denies fever chills No recent weight loss No shortness of breath He has chronic prostatitis, sxs at baseline, specifically no gross hematuria. He did mention that for the last few months he has experienced occasional right or left abdominal discomfort but nothing like yesterday    Review of Systems See above   Past Medical History:  Diagnosis Date  . ALLERGIC RHINITIS 12/19/2009  . Allergy   . Back pain chronic    On hydrocodone-Neurontin  . Blepharitis    B, chronic  . BPH (benign prostatic hyperplasia)   . Chronic prostatitis     f/u by urology, has a prostate massage prn  . GERD (gastroesophageal reflux disease)   . Groin injury   . HYPOGONADISM, MALE 08/11/2007   f/u by Dr  Thomasene Mohair urology    . OSTEOPOROSIS, IDIOPATHIC 08/11/2007   f/u at [facility] , now improved to osteopenia  . Raynaud's disease /phenomenon 08/16/2012  . Rosacea   . Shingles 09-2014  . Varicose veins     Past Surgical History:  Procedure Laterality Date  . BLEPHAROPLASTY     B blepharoplasty @ [facility] 08-2011  . CERVICAL DISCECTOMY  1998  . CERVICAL FUSION  1994   C4-C5-C6 fusion  . COLONOSCOPY    . EYE SURGERY     eyelid surgery on both eyes  . HERNIA REPAIR Left 1979  . IR GENERIC HISTORICAL  03/17/2016   IR EMBO VENOUS NOT HEMORR HEMANG  INC GUIDE ROADMAPPING 03/17/2016 Greggory Keen, MD [facility]TRASOUND  . KNEE ARTHROSCOPY    . POLYPECTOMY    . SHOULDER SURGERY Right    early 2000s  . SIGMOIDOSCOPY    . TRANSURETHRAL RESECTION OF PROSTATE  07-2009        Objective:   Physical Exam Ht 6' (1.829 m)   BMI 27.80 kg/m  This is a virtual video visit, he is alert oriented x3, in no apparent distress.    Assessment     Assessment   Osteoporosis, idiopathic, follow-up at [facility]. Raynaud phenomena 2013; amlodipine prn.   MSK -- s/p Cervical fusion --Chronic back pain: used to be on Hydrocodone rx by pcp;  on gabapentin ) -- L shoulder adhesive capsulitis -- stress fracture L sesamoid (foot) dx 2018) GU:  Dr. Thomasene Mohair, urology --BPH, TURP 2011 --Chronic prostatitis --Hypogonadism, per urology Rosacea Varicose veins treatment @ [facility] OPHT: Glaucoma -Chronic blepharitis- ocular rosacea- Mac degeneration H/o Shingles 09-2014   PLAN Diarrhea: The patient had severe right lower quadrant abdominal cramps yesterday  follow-up by diarrhea.  No fever chills LUTS at baseline.  DDx includes acute diarrhea, colitis, kidney stones, appendicitis (very unlikely) He knows the limitations of a virtual visit however he is feeling better today, no red flag symptoms consequently this could have been a self limited acute diarrhea. We agreed on observation, good hydration, call if symptoms resurface  and seek immediate medical attention if red flag symptoms such as fever chills, crescendo RLQ abdominal pain, blood in the stools etc. He verbalized understanding of my plan.    I discussed the assessment and treatment plan with the patient. The patient was provided an opportunity to ask questions and all were answered. The patient agreed with the plan and demonstrated an understanding of the instructions.   The patient was advised to call back or seek an in-person evaluation if the symptoms worsen or if the condition fails to improve as anticipated.

## 2019-08-12 NOTE — Assessment & Plan Note (Signed)
Diarrhea: The patient had severe right lower quadrant abdominal cramps yesterday  follow-up by diarrhea.  No fever chills LUTS at baseline.  DDx includes acute diarrhea, colitis, kidney stones, appendicitis (very unlikely) He knows the limitations of a virtual visit however he is feeling better today, no red flag symptoms consequently this could have been a self limited acute diarrhea. We agreed on observation, good hydration, call if symptoms resurface and seek immediate medical attention if red flag symptoms such as fever chills, crescendo RLQ abdominal pain, blood in the stools etc. He verbalized understanding of my plan.

## 2019-08-14 ENCOUNTER — Encounter: Payer: Self-pay | Admitting: Orthopedic Surgery

## 2019-08-14 ENCOUNTER — Other Ambulatory Visit: Payer: Self-pay

## 2019-08-14 ENCOUNTER — Ambulatory Visit (INDEPENDENT_AMBULATORY_CARE_PROVIDER_SITE_OTHER): Payer: PPO | Admitting: Orthopedic Surgery

## 2019-08-14 ENCOUNTER — Ambulatory Visit (INDEPENDENT_AMBULATORY_CARE_PROVIDER_SITE_OTHER): Payer: PPO

## 2019-08-14 VITALS — Ht 72.0 in | Wt 207.0 lb

## 2019-08-14 DIAGNOSIS — M79672 Pain in left foot: Secondary | ICD-10-CM | POA: Diagnosis not present

## 2019-08-14 NOTE — Progress Notes (Signed)
Office Visit Note   Patient: Erik [name]           Date of Birth: October 14, 1955           MRN: [id number] Visit Date: 08/14/2019              Requested by: Colon Branch, [address] [place],  [place] [id number] PCP: Colon Branch, MD  Chief Complaint  Patient presents with  . 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  Patient has increased pain is increasing his level of activity complains of swelling around the MTP joint he has tried stiff soled shoes without relief.  Assessment & Plan: Visit Diagnoses:  1. Pain in left foot     Plan: Discussed with patient the surgical treatment options would either be a cheilectomy that has approximately 50% improvement versus a fusion that should resolve all of his pain but would completely eliminate the motion at the MTP joint.  Patient states he feels like he has failed conservative therapy and would like to proceed with a cheilectomy as an outpatient at [facility] day surgery.  Risks and benefits were discussed including infection persistent pain need for fusion surgery patient states he understands wished to proceed.  Follow-Up Instructions: No follow-ups on file.   Ortho Exam  Patient is alert, oriented, no adenopathy, well-dressed, normal affect, normal respiratory effort. Examination patient has a good dorsalis pedis pulse he has dorsiflexion to neutral.  He has plantar flexion of the great toe MTP joint of 0 degrees dorsiflexion of only 20 degrees.  He is tender to palpation around the MTP joint and does have swelling.  Imaging: XR Foot Complete Left  Result Date: 08/14/2019 2 view radiographs of the left foot shows subcondylar sclerosis with dorsal osteophytic bone spurs  No images are attached to the encounter.  Labs: Lab Results  Component Value Date   HGBA1C 5.6 06/10/2018   ESRSEDRATE 2 05/05/2019   ESRSEDRATE  1 03/25/2017   ESRSEDRATE 8 02/13/2014   CRP 0.9 03/25/2017   LABURIC 4.0 02/13/2014     Lab Results  Component Value Date   ALBUMIN 4.2 06/14/2019   ALBUMIN 4.4 06/10/2018   ALBUMIN 4.1 09/20/2017   LABURIC 4.0 02/13/2014    Lab Results  Component Value Date   MG 2.1 03/25/2017   Lab Results  Component Value Date   VD25OH 31.59 03/25/2017   VD25OH 44 10/26/2011    No results found for: PREALBUMIN CBC EXTENDED Latest Ref Rng & Units 06/14/2019 09/20/2017 03/25/2017  WBC 4.0 - 10.5 K/uL 4.7 3.7(L) 4.8  RBC 4.22 - 5.81 Mil/uL 4.76 4.88 5.24  HGB 13.0 - 17.0 g/dL 14.6 15.2 16.4  HCT 39.0 - 52.0 % 43.1 43.6 48.0  PLT 150.0 - 400.0 K/uL 209.0 184.0 208.0  NEUTROABS 1.4 - 7.7 K/uL 2.6 2.0 2.9  LYMPHSABS 0.7 - 4.0 K/uL 1.6 1.2 1.4     Body mass index is 28.07 kg/m.  Orders:  Orders Placed This Encounter  Procedures  . XR Foot Complete Left   No orders of the defined types were placed in this encounter.    Procedures: No procedures performed  Clinical Data: No additional findings.  ROS:  All other systems negative, except as noted in the HPI. Review of Systems  Objective: Vital Signs: Ht 6' (1.829 m)  Wt 207 lb (93.9 kg)   BMI 28.07 kg/m   Specialty Comments:  No specialty comments available.  PMFS History: Patient Active Problem List   Diagnosis Date Noted  . Arthritis of carpometacarpal [facility]) joint of left thumb 12/06/2018  . Early stage glaucoma 07/26/2018  . Enlarged prostate 07/26/2018  . Raynaud's disease without gangrene 10/08/2017  . 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 Lumbar radiculopathy 03/25/2017  . Degenerative cervical disc 06/08/2016  . Degenerative lumbar disc 06/08/2016  . PCP NOTES >>>>>>>>>>>>>>>>>>>> 04/29/2015  . Binocular vision disorder with diplopia 10/01/2014  . 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 Herpes zoster-- 09-2014 09/05/2014  . Anal fissure  02/28/2014  . Chronic prostatitis 02/28/2014  . Abnormal prostate specific antigen 02/28/2014  . Gastroesophageal reflux disease without esophagitis 02/28/2014  . Ganglion of tendon 02/28/2014  . Incomplete bladder emptying 02/28/2014  . FOM (frequency of micturition) 02/28/2014  . Nonexudative age-related macular degeneration 02/12/2014  . 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 Annual physical exam 10/21/2011  . Neck -- back pain- UDS 09/18/2011  . Other allergic rhinitis 12/19/2009  . PELVIC PAIN, CHRONIC 04/03/2009  . Hypogonadism -- per urology 08/11/2007  . OSTEOPOROSIS, IDIOPATHIC 08/11/2007  . HEADACHE 08/11/2007   Past Medical History:  Diagnosis Date  . ALLERGIC RHINITIS 12/19/2009  . Allergy   . Back pain chronic    On hydrocodone-Neurontin  . Blepharitis    B, chronic  . BPH (benign prostatic hyperplasia)   . Chronic prostatitis     f/u by urology, has a prostate massage prn  . GERD (gastroesophageal reflux disease)   . Groin injury   . HYPOGONADISM, MALE 08/11/2007   f/u by Dr Thomasene Mohair urology    . OSTEOPOROSIS, IDIOPATHIC 08/11/2007   f/u at [facility] , now improved to osteopenia  . Raynaud's disease /phenomenon 08/16/2012  . Rosacea   . Shingles 09-2014  . Varicose veins     Family History  Problem Relation Age of Onset  . COPD Father        Died, 71  . Hypertension Father   . Stroke Father   . CAD Father   . Allergic rhinitis Father   . Diabetes Mother   . Hypertension Mother   . CAD Mother           . Colon polyps Mother   . Cancer Mother        Ovarian cancer  . Hypertension Sister   . High Cholesterol Sister   . Colon cancer Maternal Aunt   . Prostate cancer Neg Hx   . Esophageal cancer Neg Hx   . Rectal cancer Neg Hx   . Stomach cancer Neg Hx   . Pancreatic cancer Neg Hx   . Angioedema Neg Hx   . Asthma Neg Hx   . Eczema Neg Hx   . Immunodeficiency Neg Hx   . Urticaria Neg Hx     Past Surgical History:  Procedure Laterality Date   . BLEPHAROPLASTY     B blepharoplasty @ [facility] 08-2011  . CERVICAL DISCECTOMY  1998  . CERVICAL FUSION  1994   C4-C5-C6 fusion  . COLONOSCOPY    . EYE SURGERY     eyelid surgery on both eyes  . HERNIA REPAIR Left 1979  . IR GENERIC HISTORICAL  03/17/2016   IR EMBO VENOUS NOT HEMORR HEMANG  INC GUIDE ROADMAPPING 03/17/2016 Greggory Keen, MD [facility] ULTRASOUND  .  KNEE ARTHROSCOPY    . POLYPECTOMY    . SHOULDER SURGERY Right    early 2000s  . SIGMOIDOSCOPY    . TRANSURETHRAL RESECTION OF PROSTATE  07-2009   Social History   Occupational History  . Occupation: real state   Tobacco Use  . Smoking status: Never Smoker  . Smokeless tobacco: Never Used  Substance and Sexual Activity  . Alcohol use: No    Alcohol/week: 0.0 standard drinks  . Drug use: No  . Sexual activity: Not on file

## 2019-08-15 DIAGNOSIS — M818 Other osteoporosis without current pathological fracture: Secondary | ICD-10-CM | POA: Diagnosis not present

## 2019-08-29 ENCOUNTER — Other Ambulatory Visit: Payer: Self-pay | Admitting: Physician Assistant

## 2019-09-01 ENCOUNTER — Encounter: Payer: Self-pay | Admitting: Orthopedic Surgery

## 2019-09-04 ENCOUNTER — Other Ambulatory Visit: Payer: Self-pay

## 2019-09-07 ENCOUNTER — Other Ambulatory Visit: Payer: Self-pay

## 2019-09-07 ENCOUNTER — Ambulatory Visit (INDEPENDENT_AMBULATORY_CARE_PROVIDER_SITE_OTHER): Payer: PPO | Admitting: Physician Assistant

## 2019-09-07 ENCOUNTER — Other Ambulatory Visit (INDEPENDENT_AMBULATORY_CARE_PROVIDER_SITE_OTHER): Payer: PPO

## 2019-09-07 ENCOUNTER — Encounter: Payer: Self-pay | Admitting: Physician Assistant

## 2019-09-07 VITALS — BP 120/70 | HR 72 | Temp 98.1°F | Ht 70.75 in | Wt 213.4 lb

## 2019-09-07 DIAGNOSIS — R1011 Right upper quadrant pain: Secondary | ICD-10-CM

## 2019-09-07 DIAGNOSIS — M7918 Myalgia, other site: Secondary | ICD-10-CM | POA: Diagnosis not present

## 2019-09-07 DIAGNOSIS — K219 Gastro-esophageal reflux disease without esophagitis: Secondary | ICD-10-CM

## 2019-09-07 LAB — CBC WITH DIFFERENTIAL/PLATELET
Basophils Absolute: 0 10*3/uL (ref 0.0–0.1)
Basophils Relative: 0.8 % (ref 0.0–3.0)
Eosinophils Absolute: 0.1 10*3/uL (ref 0.0–0.7)
Eosinophils Relative: 1.5 % (ref 0.0–5.0)
HCT: 44.6 % (ref 39.0–52.0)
Hemoglobin: 15.4 g/dL (ref 13.0–17.0)
Lymphocytes Relative: 28.8 % (ref 12.0–46.0)
Lymphs Abs: 1.5 10*3/uL (ref 0.7–4.0)
MCHC: 34.6 g/dL (ref 30.0–36.0)
MCV: 90.2 fl (ref 78.0–100.0)
Monocytes Absolute: 0.5 10*3/uL (ref 0.1–1.0)
Monocytes Relative: 8.9 % (ref 3.0–12.0)
Neutro Abs: 3.1 10*3/uL (ref 1.4–7.7)
Neutrophils Relative %: 60 % (ref 43.0–77.0)
Platelets: 216 10*3/uL (ref 150.0–400.0)
RBC: 4.94 Mil/uL (ref 4.22–5.81)
RDW: 13.5 % (ref 11.5–15.5)
WBC: 5.2 10*3/uL (ref 4.0–10.5)

## 2019-09-07 LAB — COMPREHENSIVE METABOLIC PANEL
ALT: 20 U/L (ref 0–53)
AST: 19 U/L (ref 0–37)
Albumin: 4.2 g/dL (ref 3.5–5.2)
Alkaline Phosphatase: 64 U/L (ref 39–117)
BUN: 15 mg/dL (ref 6–23)
CO2: 31 mEq/L (ref 19–32)
Calcium: 9.2 mg/dL (ref 8.4–10.5)
Chloride: 103 mEq/L (ref 96–112)
Creatinine, Ser: 0.83 mg/dL (ref 0.40–1.50)
GFR: 92.86 mL/min (ref >60.00)
Glucose, Bld: 115 mg/dL — ABNORMAL HIGH (ref 70–99)
Potassium: 3.9 mEq/L (ref 3.5–5.1)
Sodium: 140 mEq/L (ref 135–145)
Total Bilirubin: 1.1 mg/dL (ref 0.2–1.2)
Total Protein: 6.4 g/dL (ref 6.0–8.3)

## 2019-09-07 LAB — LIPASE: Lipase: 29 U/L (ref 11.0–59.0)

## 2019-09-07 NOTE — Progress Notes (Signed)
Chief Complaint: Buttock pain, right upper quadrant pain and GERD  HPI:    Erik [name] is a 64 year old Caucasian male with a past medical history as listed below, known to Dr. Ardis Hughs, who presents clinic today with multiple complaints including buttock pain, right upper quadrant pain and reflux.    09/20/2017 office visit with me for rectal pain.  At that time described a history of fissures and fistulas treated by [facility] gastroenterology in Erik past.  At that time anoscopy showed some purulent matter.  MRI of Erik pelvis with and without contrast was ordered for further abscess which was negative for fistula or abscess.  [name] was prescribed Cipro 500 twice daily and metronidazole 500 3 times daily for 7 days.    11/12/2017 Flex sigmoidoscopy for rectal pain/fullness and a normal pelvic MRI with known chronic prostatitis with findings of slightly irregular rectal mucosa which was biopsied and small external hemorrhoids.  Pathology with mild reactive changes.    Today, Erik [name] presents to clinic and explains first that he has having troubles with frequent drainage in his throat, apparently went to see an allergist a year ago who told him this was due to reflux.  He is on Omeprazole 40 mg daily but also concerned about Erik side effects from being on this medicine long-term.  Tells me this does seem to help his reflux symptoms but he continues with frequent drainage in his throat which is often worse after eating.    Also tells me that in August he broke ribs 8 and 9 on Erik right side after falling when putting in a fan.  He seemed to have been healing from this but about 3 weeks ago had a severe cramp on Erik right side of his abdomen up under his rib cage which he was unsure if it was related or not to this fall, it lasted for about 3 hours and after this severe cramp which is rated as a 10/10 he then developed severe diarrhea for 3 hours.  After this he had no further symptoms.  Does not recall  eating a high fatty meal before symptoms.    Also complains today of buttock pain, more to Erik right side about 3 inches from his rectum, this has been occurring over Erik past couple of months.  It is somewhat tender to Erik touch but when it really bothers him is if he sits still for more than an hour at a time, so he has to consciously remember to get up and move around.  Sometimes if he is driving or sitting in a chair it will bother him.  It does not hurt after a bowel movement or really change at all with a bowel movement.  Does tell me interestingly though that Erik Hydrocortisone ointment given to him at an appointment maybe "helps a little bit" when he puts it around his rectum.  Does tell me that he sees Hulan Saas, DO for sports medicine and has an appointment next week.    Denies fever, chills or blood in his stool.  Past Medical History:  Diagnosis Date  . ALLERGIC RHINITIS 12/19/2009  . Allergy   . Back pain chronic    On hydrocodone-Neurontin  . Blepharitis    B, chronic  . BPH (benign prostatic hyperplasia)   . Chronic prostatitis     f/u by urology, has a prostate massage prn  . COVID-19 03/2019  . GERD (gastroesophageal reflux disease)   . Groin injury   .  HYPOGONADISM, MALE 08/11/2007   f/u by Dr Thomasene Mohair urology    . OSTEOPOROSIS, IDIOPATHIC 08/11/2007   f/u at [facility] , now improved to osteopenia  . Raynaud's disease /phenomenon 08/16/2012  . Rosacea   . Shingles 09-2014  . Varicose veins     Past Surgical History:  Procedure Laterality Date  . BLEPHAROPLASTY     B blepharoplasty @ [facility] 08-2011  . CERVICAL DISCECTOMY  1998  . CERVICAL FUSION  1994   C4-C5-C6 fusion  . COLONOSCOPY    . EYE SURGERY     eyelid surgery on both eyes  . HERNIA REPAIR Left 1979  . IR GENERIC HISTORICAL  03/17/2016   IR EMBO VENOUS NOT HEMORR HEMANG  INC GUIDE ROADMAPPING 03/17/2016 Greggory Keen, MD [facility] ULTRASOUND  . KNEE ARTHROSCOPY    . POLYPECTOMY    . SHOULDER SURGERY Right     early 2000s  . SIGMOIDOSCOPY    . TRANSURETHRAL RESECTION OF PROSTATE  07-2009    Current Outpatient Medications  Medication Sig Dispense Refill  . amLODipine (NORVASC) 2.5 MG tablet Take 1 tablet (2.5 mg total) by mouth daily. 90 tablet 1  . B-D INTEGRA SYRINGE 23G X 1" 3 ML MISC     . gabapentin (NEURONTIN) 300 MG capsule TAKE 1 CAPSULE (300 MG TOTAL) BY MOUTH AT BEDTIME. 90 capsule 1  . lidocaine (LINDAMANTLE) 3 % CREA cream Apply 1 application topically as needed.    . lidocaine (XYLOCAINE) 5 % ointment Apply 1 application topically as needed. 35.44 g 0  . Multiple Vitamin (MULTI-VITAMINS) TABS Take by mouth.    . Omega-3 Fatty Acids (FISH OIL) 1000 MG CAPS Take 1 capsule by mouth 2 (two) times daily.    Marland Kitchen omeprazole (PRILOSEC) 40 MG capsule Use 1 capsule once or twice a day for heartburn and reflux 60 capsule 5  . OVER Erik COUNTER MEDICATION Take 1 tablet by mouth 2 (two) times daily. AREDS 2    . sildenafil (REVATIO) 20 MG tablet TAKE 1 TABLET (20 MG TOTAL) BY MOUTH AS NEEDED FOR UP TO 30 DAYS.    Marland Kitchen tadalafil (CIALIS) 5 MG tablet Take 5 mg by mouth daily.    Marland Kitchen testosterone cypionate (DEPOTESTOTERONE CYPIONATE) 100 MG/ML injection Inject into Erik muscle every 14 (fourteen) days. For IM use only     No current facility-administered medications for this visit.    Allergies as of 09/07/2019 - Review Complete 09/07/2019  Allergen Reaction Noted  . Sulfa drugs cross reactors Nausea And Vomiting 10/21/2011    Family History  Problem Relation Age of Onset  . COPD Father        Died, 71  . Hypertension Father   . Stroke Father   . CAD Father   . Allergic rhinitis Father   . Diabetes Mother   . Hypertension Mother   . CAD Mother           . Colon polyps Mother   . Cancer Mother        Ovarian cancer  . Hypertension Sister   . High Cholesterol Sister   . Colon cancer Maternal Aunt   . Prostate cancer Neg Hx   . Esophageal cancer Neg Hx   . Rectal cancer Neg Hx   . Stomach  cancer Neg Hx   . Pancreatic cancer Neg Hx   . Angioedema Neg Hx   . Asthma Neg Hx   . Eczema Neg Hx   . Immunodeficiency Neg Hx   .  Urticaria Neg Hx     Social History   Socioeconomic History  . Marital status: Married    Spouse name: Not on file  . Number of children: 3  . Years of education: Not on file  . Highest education level: Not on file  Occupational History  . Occupation: real state   Tobacco Use  . Smoking status: Never Smoker  . Smokeless tobacco: Never Used  Substance and Sexual Activity  . Alcohol use: No    Alcohol/week: 0.0 standard drinks  . Drug use: No  . Sexual activity: Not on file  Other Topics Concern  . Not on file  Social History Narrative   Lives with wife.  They have three children.   Daughter is an Ship broker     Social Determinants of Radio broadcast assistant Strain:   . Difficulty of Paying Living Expenses: Not on file  Food Insecurity:   . Worried About Charity fundraiser in [place] Last Year: Not on file  . Ran Out of Food in Erik Last Year: Not on file  Transportation Needs:   . Lack of Transportation (Medical): Not on file  . Lack of Transportation (Non-Medical): Not on file  Physical Activity:   . Days of Exercise per Week: Not on file  . Minutes of Exercise per Session: Not on file  Stress:   . Feeling of Stress : Not on file  Social Connections:   . Frequency of Communication with Friends and Family: Not on file  . Frequency of Social Gatherings with Friends and Family: Not on file  . Attends Religious Services: Not on file  . Active Member of Clubs or Organizations: Not on file  . Attends Archivist Meetings: Not on file  . Marital Status: Not on file  Intimate Partner Violence:   . Fear of Current or Ex-Partner: Not on file  . Emotionally Abused: Not on file  . Physically Abused: Not on file  . Sexually Abused: Not on file    Review of Systems:    Constitutional: No weight loss, fever or  chills Cardiovascular: No chest pain  Respiratory: No SOB  Gastrointestinal: See HPI and otherwise negative   Physical Exam:  Vital signs: BP 120/70 (BP Location: Left Arm, [name] Position: Sitting, Cuff Size: Normal)   Pulse 72   Temp 98.1 F (36.7 C)   Ht 5' 10.75" (1.797 m)   Wt 213 lb 6 oz (96.8 kg)   BMI 29.97 kg/m   Constitutional:   Pleasant Caucasian male appears to be in NAD, Well developed, Well nourished, alert and cooperative Respiratory: Respirations even and unlabored. Lungs clear to auscultation bilaterally.   No wheezes, crackles, or rhonchi.  Cardiovascular: Normal S1, S2. No MRG. Regular rate and rhythm. No peripheral edema, cyanosis or pallor.  Gastrointestinal:  Soft, nondistended, nontender. No rebound or guarding. Normal bowel sounds. No appreciable masses or hepatomegaly. Rectal: External: tenderness with palpation 2-3 inches to right of rectum, no fluctuance, no hemorrhoids, no fissure; Internal: no mass or ttp; Anoscopy: Grade I internal hemorrhoids  Psychiatric: Demonstrates good judgement and reason without abnormal affect or behaviors.  RELEVANT LABS AND IMAGING: CBC    Component Value Date/Time   WBC 4.7 06/14/2019 1526   RBC 4.76 06/14/2019 1526   HGB 14.6 06/14/2019 1526   HCT 43.1 06/14/2019 1526   PLT 209.0 06/14/2019 1526   PLT 206 10/11/2007 0000   MCV 90.6 06/14/2019 1526   MCH 31.7 10/11/2007 0000  MCHC 33.9 06/14/2019 1526   RDW 13.1 06/14/2019 1526   LYMPHSABS 1.6 06/14/2019 1526   MONOABS 0.4 06/14/2019 1526   EOSABS 0.1 06/14/2019 1526   BASOSABS 0.0 06/14/2019 1526    CMP     Component Value Date/Time   NA 142 06/14/2019 1526   K 3.9 06/14/2019 1526   CL 105 06/14/2019 1526   CO2 29 06/14/2019 1526   GLUCOSE 87 06/14/2019 1526   BUN 18 06/14/2019 1526   CREATININE 0.74 06/14/2019 1526   CALCIUM 9.0 06/14/2019 1526   PROT 5.9 (L) 06/14/2019 1526   ALBUMIN 4.2 06/14/2019 1526   AST 18 06/14/2019 1526   ALT 20  06/14/2019 1526   ALKPHOS 69 06/14/2019 1526   BILITOT 0.8 06/14/2019 1526   GFRNONAA 125 04/12/2008 0000   GFRAA 151 04/12/2008 0000    Assessment: 1.  Right upper quadrant abdominal pain: one severe attack/spasm which lasted for 3 hours followed by diarrhea; consider gallbladder origin versus other 2.  GERD: Allergist seems to think sinus drainage is related to reflux, it is continuing regardless of Omeprazole 40 mg daily, though this does help his reflux symptoms 3.  Buttock pain: To Erik right of Erik [name]'s rectum, rectal exam unrevealing, does have a history of fistulas and abscess, though nothing on exam today, seems to hurt worse when sitting for long periods of time; consider musculoskeletal origin versus other  Plan: 1.  Discussed drainage with Erik [name].  Would recommend that he increase his omeprazole to 40 mg twice daily over Erik next month.  Send him in a new prescription for twice daily dosing #30 with 1 refill.  Explained that if this does not help his symptoms at all then he may want to see an allergist again or follow-up with his PCP. 2.  Ordered a right upper quadrant ultrasound for this right upper quadrant pain [name] was experiencing as well as CBC, CMP and lipase. 3.  Discussed buttock pain, this does not seem GI in origin and rectal exam was unrevealing today.  Would recommend that he discuss this further with Hulan Saas, DO to see if this is possibly musculoskeletal as it seems to bother him more when he is sitting still for periods of time. 4.  [name] to follow in clinic with me in 4 to 6 weeks or sooner if necessary.  Ellouise Newer, PA-C [facility] 09/07/2019, 2:23 PM  Cc: Colon Branch, MD

## 2019-09-07 NOTE — Patient Instructions (Addendum)
If you are age 66 or older, your body mass index should be between 23-30. Your Body mass index is 29.97 kg/m. If this is out of the aforementioned range listed, please consider follow up with your Primary Care Provider.  If you are age 77 or younger, your body mass index should be between 19-25. Your Body mass index is 29.97 kg/m. If this is out of the aformentioned range listed, please consider follow up with your Primary Care Provider.   Your provider has requested that you go to the basement level for lab work before leaving today. Press "B" on the elevator. The lab is located at the first door on the left as you exit the elevator.   1 .Increase Omeprazole 40 mg to twice daily for 1 month to see if it helps with sinus drainage.  2. Discuss bottom area pain with Dr. Charlann Boxer 3. Try OTC Recticare with Lidocaine.    You have been scheduled for an abdominal ultrasound at [facility] on Wednesday 09/13/19 at 9 am. Please arrive 15 minutes prior to your appointment for registration. Make certain not to have anything to eat or drink 6 hours prior to your appointment. Should you need to reschedule your appointment, please contact radiology at [phone]. This test typically takes about 30 minutes to perform.   Follow up in 6-8 weeks.   It was a pleasure to see you today!  Erik [name], [place]

## 2019-09-08 ENCOUNTER — Other Ambulatory Visit: Payer: Self-pay

## 2019-09-08 ENCOUNTER — Other Ambulatory Visit: Payer: Self-pay | Admitting: Family Medicine

## 2019-09-08 MED ORDER — OMEPRAZOLE 40 MG PO CPDR: DELAYED_RELEASE_CAPSULE | ORAL | 5 refills | Status: DC

## 2019-09-08 MED FILL — GABAPENTIN 300 MG CAPSULE: 300 | 90 days supply | Qty: 90 | Fill #0

## 2019-09-08 MED FILL — OMEPRAZOLE 40 MG CPDR: 40 | 30 days supply | Qty: 60 | Fill #0

## 2019-09-10 NOTE — Progress Notes (Signed)
I agree with the above note, plan 

## 2019-09-11 DIAGNOSIS — N411 Chronic prostatitis: Secondary | ICD-10-CM | POA: Diagnosis not present

## 2019-09-13 ENCOUNTER — Encounter: Payer: Self-pay | Admitting: Family Medicine

## 2019-09-13 ENCOUNTER — Ambulatory Visit (INDEPENDENT_AMBULATORY_CARE_PROVIDER_SITE_OTHER): Payer: PPO | Admitting: Family Medicine

## 2019-09-13 ENCOUNTER — Ambulatory Visit (INDEPENDENT_AMBULATORY_CARE_PROVIDER_SITE_OTHER): Payer: PPO

## 2019-09-13 ENCOUNTER — Ambulatory Visit: Payer: Self-pay

## 2019-09-13 ENCOUNTER — Ambulatory Visit (HOSPITAL_BASED_OUTPATIENT_CLINIC_OR_DEPARTMENT_OTHER)
Admission: RE | Admit: 2019-09-13 | Discharge: 2019-09-13 | Disposition: A | Discharge status: Home / Self Care | Payer: PPO | Source: Ambulatory Visit | Attending: Physician Assistant | Admitting: Physician Assistant

## 2019-09-13 ENCOUNTER — Other Ambulatory Visit: Payer: Self-pay

## 2019-09-13 VITALS — BP 110/70 | HR 67 | Ht 70.0 in | Wt 213.0 lb

## 2019-09-13 DIAGNOSIS — R1011 Right upper quadrant pain: Secondary | ICD-10-CM | POA: Insufficient documentation

## 2019-09-13 DIAGNOSIS — G8929 Other chronic pain: Secondary | ICD-10-CM | POA: Diagnosis not present

## 2019-09-13 DIAGNOSIS — M79645 Pain in left finger(s): Secondary | ICD-10-CM

## 2019-09-13 DIAGNOSIS — M19012 Primary osteoarthritis, left shoulder: Secondary | ICD-10-CM | POA: Diagnosis not present

## 2019-09-13 DIAGNOSIS — M25512 Pain in left shoulder: Secondary | ICD-10-CM

## 2019-09-13 DIAGNOSIS — K219 Gastro-esophageal reflux disease without esophagitis: Secondary | ICD-10-CM | POA: Insufficient documentation

## 2019-09-13 DIAGNOSIS — M65312 Trigger thumb, left thumb: Secondary | ICD-10-CM

## 2019-09-13 DIAGNOSIS — M7502 Adhesive capsulitis of left shoulder: Secondary | ICD-10-CM

## 2019-09-13 MED FILL — CIPROFLOXACIN HCL 500 MG TA: 500 | 10 days supply | Qty: 20 | Fill #0

## 2019-09-13 NOTE — Assessment & Plan Note (Signed)
Patient given injection today, tolerated the procedure well, hopefully this will help.  New problem for him at the moment.  Discussed bracing at night.  Follow-up again in 4 to 8 weeks

## 2019-09-13 NOTE — Patient Instructions (Addendum)
Good to see you Shoulder xray today Exercise 3 times a week See me again in 4-6 weeks If abdominal ultrasound consider CT chest abdomen

## 2019-09-13 NOTE — Assessment & Plan Note (Signed)
Found on ultrasound today.  Seems to be moderately posttraumatic.  Discussed home exercises and icing regimen.  Patient is to increase activity as tolerated.  Follow-up again in 4 to 8 weeks.

## 2019-09-13 NOTE — Assessment & Plan Note (Signed)
Patient appears to be having increasing concerns with this again.  This is not usually a chronic problem.  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  He does have the limited range of motion again.  We discussed the possibility of physical therapy but patient wants to avoid that at the moment.  Patient will increase activity slowly.  Given home exercises daily.  Follow-up again in 4 to 8 weeks

## 2019-09-13 NOTE — Progress Notes (Signed)
[place] Phone: [phone] Subjective:   Erik [name], am serving as a scribe for Dr. Hulan Saas. This visit occurred during the SARS-CoV-2 public health emergency.  Safety protocols were in place, including screening questions prior to the visit, additional usage of staff PPE, and extensive cleaning of exam room while observing appropriate contact time as indicated for disinfecting solutions.   I'm seeing this patient by the request  of:  Colon Branch, MD  CC: Leg pain follow-up  [id number]   04/27/2019 Repeat injection given today.  Discussed the possibility of PRP.  Discussed icing regimen and home exercise, discussed avoiding certain activities.  Patient should increase activity slowly.  Follow-up again 6 to 12 weeks.  Update 09/13/2019 Erik [name] is a 64 y.o. male coming in with complaint of left thumb and rib pain. Patient states that his thumb pain is starting to increase with grasping. Starting to have increasing discomfort and triggering again.  Patient did have a fall back in November.  Had a couple fractured ribs.  Continues to have more of a right sided pain.  Recently had an ultrasound of the abdominal done to rule out any type of gallbladder disease.  Patient states that he has had this pain in the negatives followed by some diarrhea.  Does not remember eating anything different.  States that the pain is still quite severe.  Can affect daily activities as well as his sleep.  Also having bilateral toe numbness that seems to be increasing recently. Increases with prolonged sitting.   Left shoulder decreased ROM in flexion and abduction due to to pain in middle deltoid for 6 weeks. Pain with sleeping. Has had 2nd and 3rd finger numbness. Unaware of true injury.  Patient states that has not decreased range of motion at this moment.  Broke ribs 8 and 9 last August. Gastro did [place] on that area  today. Continues to have residual breathing issues from having COVID.       Past Medical History:  Diagnosis Date  . ALLERGIC RHINITIS 12/19/2009  . Allergy   . Back pain chronic    On hydrocodone-Neurontin  . Blepharitis    B, chronic  . BPH (benign prostatic hyperplasia)   . Chronic prostatitis     f/u by urology, has a prostate massage prn  . COVID-19 03/2019  . GERD (gastroesophageal reflux disease)   . Groin injury   . HYPOGONADISM, MALE 08/11/2007   f/u by Dr Thomasene Mohair urology    . OSTEOPOROSIS, IDIOPATHIC 08/11/2007   f/u at [facility] , now improved to osteopenia  . Raynaud's disease /phenomenon 08/16/2012  . Rosacea   . Shingles 09-2014  . Varicose veins    Past Surgical History:  Procedure Laterality Date  . BLEPHAROPLASTY     B blepharoplasty @ [facility] 08-2011  . CERVICAL DISCECTOMY  1998  . CERVICAL FUSION  1994   C4-C5-C6 fusion  . COLONOSCOPY    . EYE SURGERY     eyelid surgery on both eyes  . HERNIA REPAIR Left 1979  . IR GENERIC HISTORICAL  03/17/2016   IR EMBO VENOUS NOT HEMORR HEMANG  INC GUIDE ROADMAPPING 03/17/2016 Greggory Keen, MD [facility] ULTRASOUND  . KNEE ARTHROSCOPY    . POLYPECTOMY    . SHOULDER SURGERY Right    early 2000s  . SIGMOIDOSCOPY    . TRANSURETHRAL RESECTION OF PROSTATE  07-2009   Social History   Socioeconomic History  .  Marital status: Married    Spouse name: Not on file  . Number of children: 3  . Years of education: Not on file  . Highest education level: Not on file  Occupational History  . Occupation: real state   Tobacco Use  . Smoking status: Never Smoker  . Smokeless tobacco: Never Used  Substance and Sexual Activity  . Alcohol use: [name]    Alcohol/week: 0.0 standard drinks  . Drug use: [name]  . Sexual activity: Not on file  Other Topics Concern  . Not on file  Social History Narrative   Lives with wife.  They have three children.   Daughter is an Ship broker     Social Determinants of Radio broadcast assistant Strain:    . Difficulty of Paying Living Expenses: Not on file  Food Insecurity:   . Worried About Charity fundraiser in the Last Year: Not on file  . Ran Out of Food in the Last Year: Not on file  Transportation Needs:   . Lack of Transportation (Medical): Not on file  . Lack of Transportation (Non-Medical): Not on file  Physical Activity:   . Days of Exercise per Week: Not on file  . Minutes of Exercise per Session: Not on file  Stress:   . Feeling of Stress : Not on file  Social Connections:   . Frequency of Communication with Friends and Family: Not on file  . Frequency of Social Gatherings with Friends and Family: Not on file  . Attends Religious Services: Not on file  . Active Member of Clubs or Organizations: Not on file  . Attends Archivist Meetings: Not on file  . Marital Status: Not on file   Allergies  Allergen Reactions  . Sulfa Drugs Cross Reactors Nausea And Vomiting   Family History  Problem Relation Age of Onset  . COPD Father        Died, 71  . Hypertension Father   . Stroke Father   . CAD Father   . Allergic rhinitis Father   . Diabetes Mother   . Hypertension Mother   . CAD Mother           . Colon polyps Mother   . Cancer Mother        Ovarian cancer  . Hypertension Sister   . High Cholesterol Sister   . Colon cancer Maternal Aunt   . Prostate cancer Neg Hx   . Esophageal cancer Neg Hx   . Rectal cancer Neg Hx   . Stomach cancer Neg Hx   . Pancreatic cancer Neg Hx   . Angioedema Neg Hx   . Asthma Neg Hx   . Eczema Neg Hx   . Immunodeficiency Neg Hx   . Urticaria Neg Hx     Current Outpatient Medications (Endocrine & Metabolic):  .  testosterone cypionate (DEPOTESTOTERONE CYPIONATE) 100 MG/ML injection, Inject into the muscle every 14 (fourteen) days. For IM use only  Current Outpatient Medications (Cardiovascular):  .  amLODipine (NORVASC) 2.5 MG tablet, Take 1 tablet (2.5 mg total) by mouth daily. .  sildenafil (REVATIO) 20 MG tablet,  TAKE 1 TABLET (20 MG TOTAL) BY MOUTH AS NEEDED FOR UP TO 30 DAYS. Marland Kitchen  tadalafil (CIALIS) 5 MG tablet, Take 5 mg by mouth daily.     Current Outpatient Medications (Other):  Marland Kitchen  B-D INTEGRA SYRINGE 23G X 1" 3 ML MISC,  .  gabapentin (NEURONTIN) 300 MG capsule, TAKE 1 CAPSULE BY  MOUTH AT BEDTIME .  lidocaine (LINDAMANTLE) 3 % CREA cream, Apply 1 application topically as needed. .  lidocaine (XYLOCAINE) 5 % ointment, Apply 1 application topically as needed. .  Multiple Vitamin (MULTI-VITAMINS) TABS, Take by mouth. .  Omega-3 Fatty Acids (FISH OIL) 1000 MG CAPS, Take 1 capsule by mouth 2 (two) times daily. Marland Kitchen  omeprazole (PRILOSEC) 40 MG capsule, Use 1 capsule once or twice a day for heartburn and reflux .  OVER THE COUNTER MEDICATION, Take 1 tablet by mouth 2 (two) times daily. AREDS 2   Reviewed prior external information including notes and imaging from  primary care provider As well as notes that were available from care everywhere and other healthcare systems.  Past medical history, social, surgical and family history all reviewed in electronic medical record.  [name] pertanent information unless stated regarding to the chief complaint.   Review of Systems:  [name] headache, visual changes, nausea, vomiting, diarrhea, constipation, dizziness, skin rash, fevers, chills, night sweats, weight loss, swollen lymph nodes, , joint swelling, chest pain, shortness of breath, mood changes. POSITIVE muscle aches, body aches, abdominal pain  Objective  Blood pressure 110/70, pulse 67, height 5\' 10"  (1.778 m), weight 213 lb (96.6 kg), SpO2 99 %.   General: [name] apparent distress alert and oriented x3 mood and affect normal, dressed appropriately.  HEENT: Pupils equal, extraocular movements intact  Respiratory: Patient's speak in full sentences and does not appear short of breath  Cardiovascular: [name] lower extremity edema, non tender, [name] erythema  Skin: Warm dry intact with [name] signs of infection or rash on  extremities or on axial skeleton.  Abdomen: Soft severely tender though of the right costal margin.  Positive involuntary guarding noted. Neuro: Cranial nerves II through XII are intact, neurovascularly intact in all extremities with 2+ DTRs and 2+ pulses.  Lymph: [name] lymphadenopathy of posterior or anterior cervical chain or axillae bilaterally.  Gait normal with good balance and coordination.  MSK:  tender with full range of motion and good stability and symmetric strength and tone of , elbows, wrist, hip, knee and ankles bilaterally.  Left shoulder exam does have some decreased range of motion of 95 degrees in forward flexion and 90 degrees of abduction.  Patient has full external range of motion but severe pain with Hawkins.  Rotator cuff appears to be mostly intact but does have pain and mild positive empty can Contralateral shoulder unremarkable  Left hand does have trigger thumb noted.  Limited musculoskeletal ultrasound was performed and interpreted by Lyndal Pulley  Limited ultrasound of patient's left shoulder shows the patient does have a rotator cuff tear of the supraspinatus but [name] significant retraction noted.  Does have some thickening of the capsule noted.  Patient does have significant swelling of the [facility] joint noted.   Procedure: Real-time Ultrasound Guided Injection of left acromioclavicular joint Device: GE Logiq Q7 Ultrasound guided injection is preferred based studies that show increased duration, increased effect, greater accuracy, decreased procedural pain, increased response rate, and decreased cost with ultrasound guided versus blind injection.  Verbal informed consent obtained.  Time-out conducted.  Noted [name] overlying erythema, induration, or other signs of local infection.  Skin prepped in a sterile fashion.  Local anesthesia: Topical Ethyl chloride.  With sterile technique and under real time ultrasound guidance:   Completed without difficulty  Pain minimally  improved Advised to call if fevers/chills, erythema, induration, drainage, or persistent bleeding.  Images permanently stored and available for review in the ultrasound  unit.  Impression: Technically successful ultrasound guided injection.  Procedure: Real-time Ultrasound Guided Injection of left trigger thumb Device: GE Logiq Q7 Ultrasound guided injection is preferred based studies that show increased duration, increased effect, greater accuracy, decreased procedural pain, increased response rate, and decreased cost with ultrasound guided versus blind injection.  Verbal informed consent obtained.  Time-out conducted.  Noted [name] overlying erythema, induration, or other signs of local infection.  Skin prepped in a sterile fashion.  Local anesthesia: Topical Ethyl chloride.  With sterile technique and under real time ultrasound guidance: With a 25-gauge half inch needle injected with 0.5 cc of 0.5% Marcaine and 0.5 cc of Kenalog 40 mg/mL Completed without difficulty  Pain immediately resolved suggesting accurate placement of the medication.  Advised to call if fevers/chills, erythema, induration, drainage, or persistent bleeding.  Images permanently stored and available for review in the ultrasound unit.  Impression: Technically successful ultrasound guided injection.    Impression and Recommendations:     This case required medical decision making of moderate complexity. The above documentation has been reviewed and is accurate and complete Lyndal Pulley, DO       Note: This dictation was prepared with Dragon dictation along with smaller phrase technology. Any transcriptional errors that result from this process are unintentional.

## 2019-09-14 ENCOUNTER — Other Ambulatory Visit: Payer: Self-pay

## 2019-09-14 ENCOUNTER — Encounter: Payer: Self-pay | Admitting: Family Medicine

## 2019-09-14 MED ORDER — HYOSCYAMINE SULFATE 0.125 MG SL SUBL: 0.1250 mg | SUBLINGUAL_TABLET | SUBLINGUAL | 1 refills | Status: AC | PRN

## 2019-09-14 MED FILL — HYOSCYAMINE SULFATE 0.125 M: 0.125 | 7 days supply | Qty: 30 | Fill #0

## 2019-09-20 ENCOUNTER — Encounter (HOSPITAL_BASED_OUTPATIENT_CLINIC_OR_DEPARTMENT_OTHER): Payer: Self-pay | Admitting: Orthopedic Surgery

## 2019-09-20 ENCOUNTER — Other Ambulatory Visit: Payer: Self-pay

## 2019-09-20 MED FILL — TESTOSTERONE CYP 200 MG/ML: 200 | 84 days supply | Qty: 6 | Fill #1

## 2019-09-20 MED FILL — BD INTEGRA SYRN 3 ML 23GX1: 23G X 1" | 84 days supply | Qty: 6 | Fill #0

## 2019-09-20 MED FILL — BD INTEGRA SYRN 3 ML 23GX1": 23G X 1" | 84 days supply | Qty: 6 | Fill #0

## 2019-09-22 ENCOUNTER — Other Ambulatory Visit (HOSPITAL_COMMUNITY)
Admission: RE | Admit: 2019-09-22 | Discharge: 2019-09-22 | Disposition: A | Discharge status: Home / Self Care | Payer: PPO | Source: Ambulatory Visit | Attending: Orthopedic Surgery | Admitting: Orthopedic Surgery

## 2019-09-22 DIAGNOSIS — Z20822 Contact with and (suspected) exposure to covid-19: Secondary | ICD-10-CM | POA: Insufficient documentation

## 2019-09-22 DIAGNOSIS — Z01812 Encounter for preprocedural laboratory examination: Secondary | ICD-10-CM | POA: Insufficient documentation

## 2019-09-22 LAB — SARS CORONAVIRUS 2 (TAT 6-24 HRS): SARS Coronavirus 2: NEGATIVE

## 2019-09-26 ENCOUNTER — Encounter (HOSPITAL_BASED_OUTPATIENT_CLINIC_OR_DEPARTMENT_OTHER): Payer: Self-pay | Admitting: Orthopedic Surgery

## 2019-09-26 ENCOUNTER — Encounter (HOSPITAL_BASED_OUTPATIENT_CLINIC_OR_DEPARTMENT_OTHER)
Admission: RE | Disposition: A | Discharge status: Home / Self Care | Payer: Self-pay | Source: Home / Self Care | Attending: Orthopedic Surgery

## 2019-09-26 ENCOUNTER — Ambulatory Visit (HOSPITAL_BASED_OUTPATIENT_CLINIC_OR_DEPARTMENT_OTHER)
Admission: RE | Admit: 2019-09-26 | Discharge: 2019-09-26 | Disposition: A | Discharge status: Home / Self Care | Payer: PPO | Attending: Orthopedic Surgery | Admitting: Orthopedic Surgery

## 2019-09-26 ENCOUNTER — Other Ambulatory Visit: Payer: Self-pay

## 2019-09-26 ENCOUNTER — Ambulatory Visit (HOSPITAL_BASED_OUTPATIENT_CLINIC_OR_DEPARTMENT_OTHER): Payer: PPO | Admitting: Anesthesiology

## 2019-09-26 DIAGNOSIS — M2022 Hallux rigidus, left foot: Secondary | ICD-10-CM

## 2019-09-26 DIAGNOSIS — Z823 Family history of stroke: Secondary | ICD-10-CM | POA: Diagnosis not present

## 2019-09-26 DIAGNOSIS — Z8349 Family history of other endocrine, nutritional and metabolic diseases: Secondary | ICD-10-CM | POA: Insufficient documentation

## 2019-09-26 DIAGNOSIS — Z981 Arthrodesis status: Secondary | ICD-10-CM | POA: Diagnosis not present

## 2019-09-26 DIAGNOSIS — N400 Enlarged prostate without lower urinary tract symptoms: Secondary | ICD-10-CM | POA: Insufficient documentation

## 2019-09-26 DIAGNOSIS — Z79899 Other long term (current) drug therapy: Secondary | ICD-10-CM | POA: Diagnosis not present

## 2019-09-26 DIAGNOSIS — I10 Essential (primary) hypertension: Secondary | ICD-10-CM | POA: Insufficient documentation

## 2019-09-26 DIAGNOSIS — K219 Gastro-esophageal reflux disease without esophagitis: Secondary | ICD-10-CM | POA: Insufficient documentation

## 2019-09-26 DIAGNOSIS — M1990 Unspecified osteoarthritis, unspecified site: Secondary | ICD-10-CM | POA: Diagnosis not present

## 2019-09-26 DIAGNOSIS — M8580 Other specified disorders of bone density and structure, unspecified site: Secondary | ICD-10-CM | POA: Insufficient documentation

## 2019-09-26 DIAGNOSIS — M549 Dorsalgia, unspecified: Secondary | ICD-10-CM | POA: Insufficient documentation

## 2019-09-26 DIAGNOSIS — Z8371 Family history of colonic polyps: Secondary | ICD-10-CM | POA: Insufficient documentation

## 2019-09-26 DIAGNOSIS — Z8041 Family history of malignant neoplasm of ovary: Secondary | ICD-10-CM | POA: Insufficient documentation

## 2019-09-26 DIAGNOSIS — Z833 Family history of diabetes mellitus: Secondary | ICD-10-CM | POA: Diagnosis not present

## 2019-09-26 DIAGNOSIS — Z882 Allergy status to sulfonamides status: Secondary | ICD-10-CM | POA: Insufficient documentation

## 2019-09-26 DIAGNOSIS — M810 Age-related osteoporosis without current pathological fracture: Secondary | ICD-10-CM | POA: Diagnosis not present

## 2019-09-26 DIAGNOSIS — G8929 Other chronic pain: Secondary | ICD-10-CM | POA: Insufficient documentation

## 2019-09-26 DIAGNOSIS — Z8616 Personal history of COVID-19: Secondary | ICD-10-CM | POA: Diagnosis not present

## 2019-09-26 DIAGNOSIS — Z8249 Family history of ischemic heart disease and other diseases of the circulatory system: Secondary | ICD-10-CM | POA: Insufficient documentation

## 2019-09-26 DIAGNOSIS — I7300 Raynaud's syndrome without gangrene: Secondary | ICD-10-CM | POA: Diagnosis not present

## 2019-09-26 DIAGNOSIS — Z825 Family history of asthma and other chronic lower respiratory diseases: Secondary | ICD-10-CM | POA: Insufficient documentation

## 2019-09-26 HISTORY — PX: CHEILECTOMY: SHX1336

## 2019-09-26 SURGERY — CHEILECTOMY
Anesthesia: Monitor Anesthesia Care | Site: Foot | Laterality: Left

## 2019-09-26 MED ORDER — FENTANYL CITRATE (PF) 100 MCG/2ML IJ SOLN
50.0000 ug | INTRAMUSCULAR | Status: DC | PRN
  Administered 2019-09-26: 100 ug via INTRAVENOUS

## 2019-09-26 MED ORDER — BUPIVACAINE HCL (PF) 0.25 % IJ SOLN
INTRAMUSCULAR | Status: AC
  Filled 2019-09-26: qty 30

## 2019-09-26 MED ORDER — LIDOCAINE HCL (CARDIAC) PF 100 MG/5ML IV SOSY
PREFILLED_SYRINGE | INTRAVENOUS | Status: DC | PRN
  Administered 2019-09-26: 40 mg via INTRAVENOUS

## 2019-09-26 MED ORDER — CHLORHEXIDINE GLUCONATE 4 % EX LIQD: 60.0000 mL | Freq: Once | CUTANEOUS | Status: DC

## 2019-09-26 MED ORDER — CEFAZOLIN SODIUM-DEXTROSE 2-4 GM/100ML-% IV SOLN
2.0000 g | INTRAVENOUS | Status: AC
  Administered 2019-09-26: 2 g via INTRAVENOUS

## 2019-09-26 MED ORDER — PROPOFOL 500 MG/50ML IV EMUL
INTRAVENOUS | Status: DC | PRN
  Administered 2019-09-26: 75 ug/kg/min via INTRAVENOUS

## 2019-09-26 MED ORDER — PROPOFOL 10 MG/ML IV BOLUS
INTRAVENOUS | Status: DC | PRN
  Administered 2019-09-26: 40 mg via INTRAVENOUS

## 2019-09-26 MED ORDER — CEFAZOLIN SODIUM-DEXTROSE 2-4 GM/100ML-% IV SOLN
INTRAVENOUS | Status: AC
  Filled 2019-09-26: qty 100

## 2019-09-26 MED ORDER — LACTATED RINGERS IV SOLN: INTRAVENOUS | Status: DC

## 2019-09-26 MED ORDER — FENTANYL CITRATE (PF) 100 MCG/2ML IJ SOLN
25.0000 ug | INTRAMUSCULAR | Status: DC | PRN
  Administered 2019-09-26: 50 ug via INTRAVENOUS

## 2019-09-26 MED ORDER — ROPIVACAINE HCL 5 MG/ML IJ SOLN
INTRAMUSCULAR | Status: DC | PRN
  Administered 2019-09-26: 30 mL via PERINEURAL

## 2019-09-26 MED ORDER — MIDAZOLAM HCL 2 MG/2ML IJ SOLN
1.0000 mg | INTRAMUSCULAR | Status: DC | PRN
  Administered 2019-09-26: 07:00:00 2 mg via INTRAVENOUS

## 2019-09-26 MED ORDER — LIDOCAINE 2% (20 MG/ML) 5 ML SYRINGE
INTRAMUSCULAR | Status: AC
  Filled 2019-09-26: qty 5

## 2019-09-26 MED ORDER — FENTANYL CITRATE (PF) 100 MCG/2ML IJ SOLN
INTRAMUSCULAR | Status: AC
  Filled 2019-09-26: qty 2

## 2019-09-26 MED ORDER — DEXAMETHASONE SODIUM PHOSPHATE 10 MG/ML IJ SOLN
INTRAMUSCULAR | Status: DC | PRN
  Administered 2019-09-26: 5 mg

## 2019-09-26 MED ORDER — ACETAMINOPHEN 500 MG PO TABS: 1000.0000 mg | ORAL_TABLET | Freq: Once | ORAL | Status: DC

## 2019-09-26 MED ORDER — PROPOFOL 500 MG/50ML IV EMUL
INTRAVENOUS | Status: AC
  Filled 2019-09-26: qty 50

## 2019-09-26 MED ORDER — BUPIVACAINE HCL (PF) 0.5 % IJ SOLN
INTRAMUSCULAR | Status: AC
  Filled 2019-09-26: qty 60

## 2019-09-26 MED ORDER — OXYCODONE-ACETAMINOPHEN 5-325 MG PO TABS: 1.0000 | ORAL_TABLET | ORAL | 0 refills | Status: DC | PRN

## 2019-09-26 MED ORDER — MIDAZOLAM HCL 2 MG/2ML IJ SOLN
INTRAMUSCULAR | Status: AC
  Filled 2019-09-26: qty 2

## 2019-09-26 MED FILL — OXYCODONE-ACETAMINOPHEN 5-3: 5-325 | 5 days supply | Qty: 30 | Fill #0

## 2019-09-26 SURGICAL SUPPLY — 55 items
BLADE OSC/SAG .038X5.5 CUT EDG (BLADE) ×2 IMPLANT
BLADE SURG 10 STRL SS (BLADE) ×1 IMPLANT
BLADE SURG 15 STRL LF DISP TIS (BLADE) ×1 IMPLANT
BLADE SURG 15 STRL SS (BLADE) ×2
BNDG COHESIVE 4X5 TAN STRL (GAUZE/BANDAGES/DRESSINGS) ×2 IMPLANT
BNDG ESMARK 4X9 LF (GAUZE/BANDAGES/DRESSINGS) ×2 IMPLANT
BNDG GAUZE ELAST 4 BULKY (GAUZE/BANDAGES/DRESSINGS) ×1 IMPLANT
COVER BACK TABLE 60X90IN (DRAPES) ×2 IMPLANT
COVER WAND RF STERILE (DRAPES) IMPLANT
DECANTER SPIKE VIAL GLASS SM (MISCELLANEOUS) IMPLANT
DRAPE EXTREMITY T 121X128X90 (DISPOSABLE) ×2 IMPLANT
DRAPE IMP U-DRAPE 54X76 (DRAPES) ×1 IMPLANT
DRAPE U-SHAPE 47X51 STRL (DRAPES) ×2 IMPLANT
DRSG EMULSION OIL 3X3 NADH (GAUZE/BANDAGES/DRESSINGS) ×2 IMPLANT
DRSG PAD ABDOMINAL 8X10 ST (GAUZE/BANDAGES/DRESSINGS) ×1 IMPLANT
DURAPREP 26ML APPLICATOR (WOUND CARE) ×2 IMPLANT
ELECT REM PT RETURN 9FT ADLT (ELECTROSURGICAL) ×2
ELECTRODE REM PT RTRN 9FT ADLT (ELECTROSURGICAL) ×1 IMPLANT
GAUZE 4X4 16PLY RFD (DISPOSABLE) IMPLANT
GAUZE SPONGE 4X4 12PLY STRL (GAUZE/BANDAGES/DRESSINGS) ×2 IMPLANT
GLOVE BIO SURGEON STRL SZ 6 (GLOVE) ×1 IMPLANT
GLOVE BIOGEL PI IND STRL 6.5 (GLOVE) IMPLANT
GLOVE BIOGEL PI IND STRL 7.0 (GLOVE) IMPLANT
GLOVE BIOGEL PI IND STRL 9 (GLOVE) ×1 IMPLANT
GLOVE BIOGEL PI INDICATOR 6.5 (GLOVE) ×1
GLOVE BIOGEL PI INDICATOR 7.0 (GLOVE) ×2
GLOVE BIOGEL PI INDICATOR 9 (GLOVE) ×1
GLOVE ECLIPSE 6.5 STRL STRAW (GLOVE) ×1 IMPLANT
GLOVE SURG ORTHO 9.0 STRL STRW (GLOVE) ×3 IMPLANT
GOWN STRL REUS W/ TWL LRG LVL3 (GOWN DISPOSABLE) ×1 IMPLANT
GOWN STRL REUS W/ TWL XL LVL3 (GOWN DISPOSABLE) ×1 IMPLANT
GOWN STRL REUS W/TWL LRG LVL3 (GOWN DISPOSABLE) ×4
GOWN STRL REUS W/TWL XL LVL3 (GOWN DISPOSABLE) ×1 IMPLANT
NDL HYPO 25X1 1.5 SAFETY (NEEDLE) IMPLANT
NEEDLE HYPO 25X1 1.5 SAFETY (NEEDLE) IMPLANT
NS IRRIG 1000ML POUR BTL (IV SOLUTION) ×2 IMPLANT
PACK BASIN DAY SURGERY FS (CUSTOM PROCEDURE TRAY) ×2 IMPLANT
PAD CAST 4YDX4 CTTN HI CHSV (CAST SUPPLIES) ×1 IMPLANT
PADDING CAST ABS 4INX4YD NS (CAST SUPPLIES) ×1
PADDING CAST ABS COTTON 4X4 ST (CAST SUPPLIES) ×1 IMPLANT
PADDING CAST COTTON 4X4 STRL (CAST SUPPLIES) ×2
PENCIL SMOKE EVACUATOR (MISCELLANEOUS) ×2 IMPLANT
SPONGE LAP 18X18 RF (DISPOSABLE) ×2 IMPLANT
STOCKINETTE 6  STRL (DRAPES) ×2
STOCKINETTE 6 STRL (DRAPES) ×1 IMPLANT
SUT ETHILON 2 0 FSLX (SUTURE) ×1 IMPLANT
SUT ETHILON 3 0 FSLX (SUTURE) ×1 IMPLANT
SUT ETHILON 5 0 PS 2 18 (SUTURE) IMPLANT
SUT VIC AB 2-0 CT1 27 (SUTURE) ×2
SUT VIC AB 2-0 CT1 TAPERPNT 27 (SUTURE) IMPLANT
SUT VICRYL 4-0 PS2 18IN ABS (SUTURE) IMPLANT
SYR BULB 3OZ (MISCELLANEOUS) ×2 IMPLANT
SYR CONTROL 10ML LL (SYRINGE) IMPLANT
TOWEL GREEN STERILE FF (TOWEL DISPOSABLE) ×4 IMPLANT
UNDERPAD 30X36 HEAVY ABSORB (UNDERPADS AND DIAPERS) ×1 IMPLANT

## 2019-09-26 NOTE — Anesthesia Preprocedure Evaluation (Addendum)
Anesthesia Evaluation  Patient identified by MRN, date of birth, ID band Patient awake    Reviewed: Allergy & Precautions, NPO status , Patient's Chart, lab work & pertinent test results  Airway Mallampati: I  TM Distance: >3 FB Neck ROM: Full    Dental no notable dental hx. (+) Teeth Intact, Dental Advisory Given   Pulmonary neg pulmonary ROS,  Covid positive 03/2019, now negative   Pulmonary exam normal breath sounds clear to auscultation       Cardiovascular hypertension, Pt. on medications Normal cardiovascular exam Rhythm:Regular Rate:Normal     Neuro/Psych negative neurological ROS  negative psych ROS   GI/Hepatic Neg liver ROS, GERD  Medicated,  Endo/Other  negative endocrine ROS  Renal/GU negative Renal ROS  negative genitourinary   Musculoskeletal  (+) Arthritis ,   Abdominal   Peds  Hematology negative hematology ROS (+)   Anesthesia Other Findings   Reproductive/Obstetrics                            Anesthesia Physical Anesthesia Plan  ASA: II  Anesthesia Plan: MAC and Regional   Post-op Pain Management:  Regional for Post-op pain   Induction: Intravenous  PONV Risk Score and Plan: 1 and Propofol infusion, Treatment may vary due to age or medical condition and Midazolam  Airway Management Planned: Natural Airway  Additional Equipment:   Intra-op Plan:   Post-operative Plan:   Informed Consent: I have reviewed the patients History and Physical, chart, labs and discussed the procedure including the risks, benefits and alternatives for the proposed anesthesia with the patient or authorized representative who has indicated his/her understanding and acceptance.     Dental advisory given  Plan Discussed with: CRNA  Anesthesia Plan Comments:         Anesthesia Quick Evaluation

## 2019-09-26 NOTE — Anesthesia Postprocedure Evaluation (Signed)
Anesthesia Post Note  Patient: Erik [name]  Procedure(s) Performed: CHEILECTOMY LEFT GREAT TOE (Left Foot)     Patient location during evaluation: PACU Anesthesia Type: Regional and MAC Level of consciousness: awake and alert Pain management: pain level controlled Vital Signs Assessment: post-procedure vital signs reviewed and stable Respiratory status: spontaneous breathing, nonlabored ventilation, respiratory function stable and patient connected to nasal cannula oxygen Cardiovascular status: stable and blood pressure returned to baseline Postop Assessment: no apparent nausea or vomiting Anesthetic complications: no    Last Vitals:  Vitals:   09/26/19 0855 09/26/19 0930  BP:  114/75  Pulse: (!) 58 71  Resp: 19 20  Temp:  36.4 C  SpO2: 100% 96%    Last Pain:  Vitals:   09/26/19 0930  TempSrc: Oral  PainSc: 1                  Toris Laverdiere L Vinh Sachs

## 2019-09-26 NOTE — Transfer of Care (Signed)
Immediate Anesthesia Transfer of Care Note  Patient: Erik [name]  Procedure(s) Performed: CHEILECTOMY LEFT GREAT TOE (Left Foot)  Patient Location: PACU  Anesthesia Type:MAC combined with regional for post-op pain  Level of Consciousness: awake, alert , oriented, drowsy and patient cooperative  Airway & Oxygen Therapy: Patient Spontanous Breathing  Post-op Assessment: Report given to RN and Post -op Vital signs reviewed and stable  Post vital signs: Reviewed and stable  Last Vitals:  Vitals Value Taken Time  BP 109/69 09/26/19 0822  Temp    Pulse 61 09/26/19 0823  Resp 12 09/26/19 0823  SpO2 98 % 09/26/19 0823  Vitals shown include unvalidated device data.  Last Pain:  Vitals:   09/26/19 0729  TempSrc:   PainSc: 0-No pain      Patients Stated Pain Goal: 4 (11/00/34 9611)  Complications: No apparent anesthesia complications

## 2019-09-26 NOTE — Anesthesia Procedure Notes (Signed)
Anesthesia Regional Block: Popliteal block   Pre-Anesthetic Checklist: ,, timeout performed, Correct Patient, Correct Site, Correct Laterality, Correct Procedure, Correct Position, site marked, Risks and benefits discussed,  Surgical consent,  Pre-op evaluation,  At surgeon's request and post-op pain management  Laterality: Left  Prep: Maximum Sterile Barrier Precautions used, chloraprep       Needles:  Injection technique: Single-shot  Needle Type: Echogenic Stimulator Needle     Needle Length: 9cm  Needle Gauge: 22     Additional Needles:   Procedures:,,,, ultrasound used (permanent image in chart),,,,  Narrative:  Start time: 09/26/2019 7:14 AM End time: 09/26/2019 7:24 AM Injection made incrementally with aspirations every 5 mL.  Performed by: Personally  Anesthesiologist: Freddrick March, MD  Additional Notes: Monitors applied. No increased pain on injection. No increased resistance to injection. Injection made in 5cc increments. Good needle visualization. Patient tolerated procedure well.

## 2019-09-26 NOTE — Discharge Instructions (Signed)
°  Post Anesthesia Home Care Instructions  Activity: Get plenty of rest for the remainder of the day. A responsible individual must stay with you for 24 hours following the procedure.  For the next 24 hours, DO NOT: -Drive a car -Operate machinery -Drink alcoholic beverages -Take any medication unless instructed by your physician -Make any legal decisions or sign important papers.  Meals: Start with liquid foods such as gelatin or soup. Progress to regular foods as tolerated. Avoid greasy, spicy, heavy foods. If nausea and/or vomiting occur, drink only clear liquids until the nausea and/or vomiting subsides. Call your physician if vomiting continues.  Special Instructions/Symptoms: Your throat may feel dry or sore from the anesthesia or the breathing tube placed in your throat during surgery. If this causes discomfort, gargle with warm salt water. The discomfort should disappear within 24 hours.      Call your surgeon if you experience:   1.  Fever over 101.0. 2.  Inability to urinate. 3.  Nausea and/or vomiting. 4.  Extreme swelling or bruising at the surgical site. 5.  Continued bleeding from the incision. 6.  Increased pain, redness or drainage from the incision. 7.  Problems related to your pain medication. 8.  Any problems and/or concerns  Regional Anesthesia Blocks  1. Numbness or the inability to move the "blocked" extremity may last from 3-48 hours after placement. The length of time depends on the medication injected and your individual response to the medication. If the numbness is not going away after 48 hours, call your surgeon.  2. The extremity that is blocked will need to be protected until the numbness is gone and the  Strength has returned. Because you cannot feel it, you will need to take extra care to avoid injury. Because it may be weak, you may have difficulty moving it or using it. You may not know what position it is in without looking at it while the block is  in effect.  3. For blocks in the legs and feet, returning to weight bearing and walking needs to be done carefully. You will need to wait until the numbness is entirely gone and the strength has returned. You should be able to move your leg and foot normally before you try and bear weight or walk. You will need someone to be with you when you first try to ensure you do not fall and possibly risk injury.  4. Bruising and tenderness at the needle site are common side effects and will resolve in a few days.  5. Persistent numbness or new problems with movement should be communicated to the surgeon or the [facility] ([phone])/ [facility] ([phone]). 

## 2019-09-26 NOTE — Progress Notes (Signed)
Assisted Dr. Lanetta Inch with left ultrasound guided block on lower extremity . Side rails up, monitors on throughout procedure. See vital signs in flow sheet. Tolerated Procedure well.

## 2019-09-26 NOTE — Op Note (Signed)
09/26/2019  8:29 AM  PATIENT:  Erik [name]    PRE-OPERATIVE DIAGNOSIS:  Hallux Rigidus Left Great Toe  POST-OPERATIVE DIAGNOSIS:  Same  PROCEDURE:  CHEILECTOMY LEFT GREAT TOE  SURGEON:  Newt Minion, MD  PHYSICIAN ASSISTANT:None ANESTHESIA:   General  PREOPERATIVE INDICATIONS:  [name] is a  64 y.o. male with a diagnosis of Hallux Rigidus Left Great Toe who failed conservative measures and elected for surgical management.    The risks benefits and alternatives were discussed with the patient preoperatively including but not limited to the risks of infection, bleeding, nerve injury, cardiopulmonary complications, the need for revision surgery, among others, and the patient was willing to proceed.  OPERATIVE IMPLANTS: None.  @ENCIMAGES @  OPERATIVE FINDINGS: Cartilage thinning with mild cartilage defect MTP joint subcondylar cysts.  OPERATIVE PROCEDURE: Patient brought the operating room after undergoing a regional anesthetic.  After adequate level anesthesia obtained patient's left lower extremity was prepped using DuraPrep draped into a sterile field a timeout was called.  A medial incision was made over the MTP joint this was carried down to the retinaculum which was incised and elevated.  A ostectomy was performed medially followed by a cheilectomy of the first metatarsal head and the base of the proximal phalanx.  Patient did have subcondylar cysts these were debrided the cartilage was thin and some mild delamination with arthritic changes.  Postoperatively patient was dorsiflexion was at 70 degrees with good dorsiflexion.  Patient only had about 30 degrees of dorsiflexion preoperatively.  The wound was irrigated with normal saline the inflamed capsule was excised electrocautery was used for hemostasis the retinaculum was closed using 2-0 Vicryl skin was closed using 2-0 nylon a sterile dressing was applied patient was taken to PACU in stable condition.   DISCHARGE  PLANNING:  Antibiotic duration: Preoperative antibiotics  Weightbearing: Ideally nonweightbearing on the left  Pain medication: Prescription for Percocet  Dressing care/ Wound VAC: Follow-up in the office 1 week to change the dressing  Ambulatory devices: Crutches  Discharge to: Home.  Follow-up: In the office 1 week post operative.

## 2019-09-26 NOTE — H&P (Signed)
Erik [name] is an 64 y.o. male.   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  Patient has increased pain is increasing his level of activity complains of swelling around the MTP joint he has tried stiff soled shoes without relief.  Past Medical History:  Diagnosis Date  . ALLERGIC RHINITIS 12/19/2009  . Allergy   . Back pain chronic    On hydrocodone-Neurontin  . Blepharitis    B, chronic  . BPH (benign prostatic hyperplasia)   . Chronic prostatitis     f/u by urology, has a prostate massage prn  . COVID-19 03/2019  . GERD (gastroesophageal reflux disease)   . Groin injury   . HYPOGONADISM, MALE 08/11/2007   f/u by Dr Thomasene Mohair urology    . OSTEOPOROSIS, IDIOPATHIC 08/11/2007   f/u at [facility] , now improved to osteopenia  . Raynaud's disease /phenomenon 08/16/2012  . Rosacea   . Shingles 09-2014  . Varicose veins     Past Surgical History:  Procedure Laterality Date  . BLEPHAROPLASTY     B blepharoplasty @ [facility] 08-2011  . CERVICAL DISCECTOMY  1998  . CERVICAL FUSION  1994   C4-C5-C6 fusion  . COLONOSCOPY    . EYE SURGERY     eyelid surgery on both eyes  . HERNIA REPAIR Left 1979  . IR GENERIC HISTORICAL  03/17/2016   IR EMBO VENOUS NOT HEMORR HEMANG  INC GUIDE ROADMAPPING 03/17/2016 Greggory Keen, MD [facility] ULTRASOUND  . KNEE ARTHROSCOPY    . POLYPECTOMY    . SHOULDER SURGERY Right    early 2000s  . SIGMOIDOSCOPY    . TRANSURETHRAL RESECTION OF PROSTATE  07-2009    Family History  Problem Relation Age of Onset  . COPD Father        Died, 71  . Hypertension Father   . Stroke Father   . CAD Father   . Allergic rhinitis Father   . Diabetes Mother   . Hypertension Mother   . CAD Mother           . Colon polyps Mother   . Cancer Mother        Ovarian cancer  . Hypertension Sister   . High Cholesterol Sister   . Colon cancer Maternal Aunt   . Prostate  cancer Neg Hx   . Esophageal cancer Neg Hx   . Rectal cancer Neg Hx   . Stomach cancer Neg Hx   . Pancreatic cancer Neg Hx   . Angioedema Neg Hx   . Asthma Neg Hx   . Eczema Neg Hx   . Immunodeficiency Neg Hx   . Urticaria Neg Hx    Social History:  reports that he has never smoked. He has never used smokeless tobacco. He reports that he does not drink alcohol or use drugs.  Allergies:  Allergies  Allergen Reactions  . Sulfa Drugs Cross Reactors Nausea And Vomiting    Medications Prior to Admission  Medication Sig Dispense Refill  . gabapentin (NEURONTIN) 300 MG capsule TAKE 1 CAPSULE BY MOUTH AT BEDTIME 90 capsule 1  . hyoscyamine (LEVSIN SL) 0.125 MG SL tablet Place 1 tablet (0.125 mg total) under the tongue every 4 (four) hours as needed. Take 1 tab every 4 to 6 hours, under the tongue as needed for abdominal spasms 30 tablet 1  . lidocaine (LINDAMANTLE) 3 % CREA cream Apply 1  application topically as needed.    . lidocaine (XYLOCAINE) 5 % ointment Apply 1 application topically as needed. 35.44 g 0  . Multiple Vitamin (MULTI-VITAMINS) TABS Take by mouth.    . Omega-3 Fatty Acids (FISH OIL) 1000 MG CAPS Take 1 capsule by mouth 2 (two) times daily.    Marland Kitchen omeprazole (PRILOSEC) 40 MG capsule Use 1 capsule once or twice a day for heartburn and reflux 60 capsule 5  . OVER THE COUNTER MEDICATION Take 1 tablet by mouth 2 (two) times daily. AREDS 2    . sildenafil (REVATIO) 20 MG tablet TAKE 1 TABLET (20 MG TOTAL) BY MOUTH AS NEEDED FOR UP TO 30 DAYS.    Marland Kitchen tadalafil (CIALIS) 5 MG tablet Take 5 mg by mouth daily.    Marland Kitchen testosterone cypionate (DEPOTESTOTERONE CYPIONATE) 100 MG/ML injection Inject into the muscle every 14 (fourteen) days. For IM use only    . amLODipine (NORVASC) 2.5 MG tablet Take 1 tablet (2.5 mg total) by mouth daily. (Patient taking differently: Take 2.5 mg by mouth daily. ) 90 tablet 1  . B-D INTEGRA SYRINGE 23G X 1" 3 ML MISC       No results found for this or any  previous visit (from the past 48 hour(s)). No results found.  Review of Systems  All other systems reviewed and are negative.   Blood pressure 123/80, pulse 66, temperature (!) 97.1 F (36.2 C), temperature source Tympanic, resp. rate 18, height 6' (1.829 m), weight 95 kg, SpO2 98 %. Physical Exam  Patient is alert, oriented, no adenopathy, well-dressed, normal affect, normal respiratory effort. Examination patient has a good dorsalis pedis pulse he has dorsiflexion to neutral.  He has plantar flexion of the great toe MTP joint of 0 degrees dorsiflexion of only 20 degrees.  He is tender to palpation around the MTP joint and does have swelling.Marland Kitchen Heart RRR Lungs Clear Assessment/Plan 1. Pain in left foot     Plan: Discussed with patient the surgical treatment options would either be a cheilectomy that has approximately 50% improvement versus a fusion that should resolve all of his pain but would completely eliminate the motion at the MTP joint.  Patient states he feels like he has failed conservative therapy and would like to proceed with a cheilectomy as an outpatient at [facility] day surgery.  Risks and benefits were discussed including infection persistent pain need for fusion surgery patient states he understands wished to proceed.    Bevely Palmer Jaquelynn Wanamaker, PA 09/26/2019, 7:14 AM

## 2019-09-27 ENCOUNTER — Encounter: Payer: Self-pay | Admitting: *Deleted

## 2019-10-03 ENCOUNTER — Ambulatory Visit (INDEPENDENT_AMBULATORY_CARE_PROVIDER_SITE_OTHER): Payer: PPO | Admitting: Family

## 2019-10-03 ENCOUNTER — Other Ambulatory Visit: Payer: Self-pay

## 2019-10-03 ENCOUNTER — Encounter: Payer: Self-pay | Admitting: Family

## 2019-10-03 DIAGNOSIS — M2022 Hallux rigidus, left foot: Secondary | ICD-10-CM

## 2019-10-04 ENCOUNTER — Encounter: Payer: Self-pay | Admitting: Family

## 2019-10-04 NOTE — Progress Notes (Signed)
Post-Op Visit Note   Patient: Erik [name]           Date of Birth: Nov 28, 1955           MRN: [id number] Visit Date: 10/03/2019 PCP: Colon Branch, MD  Chief Complaint:  Chief Complaint  Patient presents with  . Left Foot - Routine Post Op    09/26/19 - left great toe cheilectomy    HPI:  HPI The patient is a 64 year old gentleman seen today 1 week status post left great toe cheilectomy.  Sutures are in place he does complain that he has had a little bit more pain in the last few days but overall has been doing quite well he has been elevating for swelling.  He is eager to return to work.  He has been using crutches for weightbearing.  Ortho Exam Incision well approximated with sutures there is no gaping or drainage no surrounding erythema he does have mild to moderate swelling of his foot and ankle.  Toe is straight.  Visit Diagnoses:  1. Hallux rigidus, left foot     Plan: We will begin daily Dial soap cleansing.  Dry dressing changes.  Elevate for swelling.  He will follow-up in the office in 2 weeks for suture removal.  Follow-Up Instructions: Return in about 2 weeks (around 10/17/2019).   Imaging: No results found.  Orders:  No orders of the defined types were placed in this encounter.  No orders of the defined types were placed in this encounter.    PMFS History: Patient Active Problem List   Diagnosis Date Noted  . Hallux rigidus, left foot   . Trigger thumb of left hand 09/13/2019  . Arthritis of left acromioclavicular joint 09/13/2019  . Arthritis of carpometacarpal [facility]) joint of left thumb 12/06/2018  . Early stage glaucoma 07/26/2018  . Enlarged prostate 07/26/2018  . Raynaud's disease without gangrene 10/08/2017  . 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 Lumbar radiculopathy 03/25/2017  . Degenerative cervical disc 06/08/2016  . Degenerative lumbar disc 06/08/2016  . PCP NOTES >>>>>>>>>>>>>>>>>>>> 04/29/2015  . Binocular vision  disorder with diplopia 10/01/2014  . 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 Herpes zoster-- 09-2014 09/05/2014  . Anal fissure 02/28/2014  . Chronic prostatitis 02/28/2014  . Abnormal prostate specific antigen 02/28/2014  . Gastroesophageal reflux disease without esophagitis 02/28/2014  . Ganglion of tendon 02/28/2014  . Incomplete bladder emptying 02/28/2014  . FOM (frequency of micturition) 02/28/2014  . Nonexudative age-related macular degeneration 02/12/2014  . 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 Annual physical exam 10/21/2011  . Neck -- back pain- UDS 09/18/2011  . Other allergic rhinitis 12/19/2009  . PELVIC PAIN, CHRONIC 04/03/2009  . Hypogonadism -- per urology 08/11/2007  . OSTEOPOROSIS, IDIOPATHIC 08/11/2007  . HEADACHE 08/11/2007   Past Medical History:  Diagnosis Date  . ALLERGIC RHINITIS 12/19/2009  . Allergy   . Back pain chronic    On hydrocodone-Neurontin  . Blepharitis    B, chronic  . BPH (benign prostatic hyperplasia)   . Chronic prostatitis     f/u by urology, has a prostate massage prn  . COVID-19 03/2019  . GERD (gastroesophageal reflux disease)   . Groin injury   . HYPOGONADISM, MALE 08/11/2007   f/u by Dr Thomasene Mohair urology    . OSTEOPOROSIS, IDIOPATHIC 08/11/2007   f/u at [facility] , now improved to osteopenia  . Raynaud's disease /phenomenon 08/16/2012  .  Rosacea   . Shingles 09-2014  . Varicose veins     Family History  Problem Relation Age of Onset  . COPD Father        Died, 71  . Hypertension Father   . Stroke Father   . CAD Father   . Allergic rhinitis Father   . Diabetes Mother   . Hypertension Mother   . CAD Mother           . Colon polyps Mother   . Cancer Mother        Ovarian cancer  . Hypertension Sister   . High Cholesterol Sister   . Colon cancer Maternal Aunt   . Prostate cancer Neg Hx   . Esophageal cancer Neg Hx   . Rectal cancer Neg Hx   . Stomach cancer Neg Hx   .  Pancreatic cancer Neg Hx   . Angioedema Neg Hx   . Asthma Neg Hx   . Eczema Neg Hx   . Immunodeficiency Neg Hx   . Urticaria Neg Hx     Past Surgical History:  Procedure Laterality Date  . BLEPHAROPLASTY     B blepharoplasty @ [facility] 08-2011  . CERVICAL DISCECTOMY  1998  . CERVICAL FUSION  1994   C4-C5-C6 fusion  . CHEILECTOMY Left 09/26/2019   Procedure: CHEILECTOMY LEFT GREAT TOE;  Surgeon: Newt Minion, MD;  Location: [place];  Service: Orthopedics;  Laterality: Left;  . COLONOSCOPY    . EYE SURGERY     eyelid surgery on both eyes  . HERNIA REPAIR Left 1979  . IR GENERIC HISTORICAL  03/17/2016   IR EMBO VENOUS NOT HEMORR HEMANG  INC GUIDE ROADMAPPING 03/17/2016 Greggory Keen, MD [facility] ULTRASOUND  . KNEE ARTHROSCOPY    . POLYPECTOMY    . SHOULDER SURGERY Right    early 2000s  . SIGMOIDOSCOPY    . TRANSURETHRAL RESECTION OF PROSTATE  07-2009   Social History   Occupational History  . Occupation: [employer]   Tobacco Use  . Smoking status: Never Smoker  . Smokeless tobacco: Never Used  Substance and Sexual Activity  . Alcohol use: No    Alcohol/week: 0.0 standard drinks  . Drug use: No  . Sexual activity: Yes

## 2019-10-10 DIAGNOSIS — N411 Chronic prostatitis: Secondary | ICD-10-CM | POA: Diagnosis not present

## 2019-10-11 ENCOUNTER — Encounter: Payer: Self-pay | Admitting: Family

## 2019-10-11 ENCOUNTER — Ambulatory Visit (INDEPENDENT_AMBULATORY_CARE_PROVIDER_SITE_OTHER): Payer: PPO | Admitting: Family

## 2019-10-11 ENCOUNTER — Other Ambulatory Visit: Payer: Self-pay

## 2019-10-11 VITALS — Ht 72.0 in | Wt 209.0 lb

## 2019-10-11 DIAGNOSIS — M2022 Hallux rigidus, left foot: Secondary | ICD-10-CM

## 2019-10-11 NOTE — Progress Notes (Signed)
Post-Op Visit Note   Patient: Erik [name]           Date of Birth: 05-Feb-1956           MRN: [id number] Visit Date: 10/11/2019 PCP: Colon Branch, MD  Chief Complaint:  Chief Complaint  Patient presents with  . Left Foot - Routine Post Op    09/26/19 left GT cheilectomy     HPI:  HPI The patient is a 64 year old gentleman who is seen today 2 weeks status post left great toe cheilectomy he has been present patient is nonweightbearing in a postop shoe been doing daily Dial soap cleansing dry dressing changes.  His incision is well-healed  Ortho Exam On examination the incision is well-healed there is swelling to his toes and lower extremity.  Some ecchymosis to his toes there is no gaping no drainage no erythema no warmth toe is straight  Visit Diagnoses: No diagnosis found.  Plan: He will continue his daily Dial soap cleansing and dry dressing changes.  Discussed using a compression stocking for bilateral lower extremity edema.  He may advance his weightbearing in a postop shoe we will follow-up once more in 2 weeks  Follow-Up Instructions: No follow-ups on file.   Imaging: No results found.  Orders:  No orders of the defined types were placed in this encounter.  No orders of the defined types were placed in this encounter.    PMFS History: Patient Active Problem List   Diagnosis Date Noted  . Hallux rigidus, left foot   . Trigger thumb of left hand 09/13/2019  . Arthritis of left acromioclavicular joint 09/13/2019  . Arthritis of carpometacarpal [facility]) joint of left thumb 12/06/2018  . Early stage glaucoma 07/26/2018  . Enlarged prostate 07/26/2018  . Raynaud's disease without gangrene 10/08/2017  . 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 Lumbar radiculopathy 03/25/2017  . Degenerative cervical disc 06/08/2016  . Degenerative lumbar disc 06/08/2016  . PCP NOTES >>>>>>>>>>>>>>>>>>>> 04/29/2015  . Binocular vision disorder with diplopia 10/01/2014    . 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 Herpes zoster-- 09-2014 09/05/2014  . Anal fissure 02/28/2014  . Chronic prostatitis 02/28/2014  . Abnormal prostate specific antigen 02/28/2014  . Gastroesophageal reflux disease without esophagitis 02/28/2014  . Ganglion of tendon 02/28/2014  . Incomplete bladder emptying 02/28/2014  . FOM (frequency of micturition) 02/28/2014  . Nonexudative age-related macular degeneration 02/12/2014  . 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 Annual physical exam 10/21/2011  . Neck -- back pain- UDS 09/18/2011  . Other allergic rhinitis 12/19/2009  . PELVIC PAIN, CHRONIC 04/03/2009  . Hypogonadism -- per urology 08/11/2007  . OSTEOPOROSIS, IDIOPATHIC 08/11/2007  . HEADACHE 08/11/2007   Past Medical History:  Diagnosis Date  . ALLERGIC RHINITIS 12/19/2009  . Allergy   . Back pain chronic    On hydrocodone-Neurontin  . Blepharitis    B, chronic  . BPH (benign prostatic hyperplasia)   . Chronic prostatitis     f/u by urology, has a prostate massage prn  . COVID-19 03/2019  . GERD (gastroesophageal reflux disease)   . Groin injury   . HYPOGONADISM, MALE 08/11/2007   f/u by Dr Thomasene Mohair urology    . OSTEOPOROSIS, IDIOPATHIC 08/11/2007   f/u at [facility] , now improved to osteopenia  . Raynaud's disease /phenomenon 08/16/2012  . Rosacea   . Shingles 09-2014  . Varicose veins     Family History  Problem Relation Age of Onset  . COPD Father        Died, 71  . Hypertension Father   . Stroke Father   . CAD Father   . Allergic rhinitis Father   . Diabetes Mother   . Hypertension Mother   . CAD Mother           . Colon polyps Mother   . Cancer Mother        Ovarian cancer  . Hypertension Sister   . High Cholesterol Sister   . Colon cancer Maternal Aunt   . Prostate cancer Neg Hx   . Esophageal cancer Neg Hx   . Rectal cancer Neg Hx   . Stomach cancer Neg Hx   . Pancreatic cancer Neg Hx   .  Angioedema Neg Hx   . Asthma Neg Hx   . Eczema Neg Hx   . Immunodeficiency Neg Hx   . Urticaria Neg Hx     Past Surgical History:  Procedure Laterality Date  . BLEPHAROPLASTY     B blepharoplasty @ [facility] 08-2011  . CERVICAL DISCECTOMY  1998  . CERVICAL FUSION  1994   C4-C5-C6 fusion  . CHEILECTOMY Left 09/26/2019   Procedure: CHEILECTOMY LEFT GREAT TOE;  Surgeon: Newt Minion, MD;  Location: [place];  Service: Orthopedics;  Laterality: Left;  . COLONOSCOPY    . EYE SURGERY     eyelid surgery on both eyes  . HERNIA REPAIR Left 1979  . IR GENERIC HISTORICAL  03/17/2016   IR EMBO VENOUS NOT HEMORR HEMANG  INC GUIDE ROADMAPPING 03/17/2016 Greggory Keen, MD [facility] ULTRASOUND  . KNEE ARTHROSCOPY    . POLYPECTOMY    . SHOULDER SURGERY Right    early 2000s  . SIGMOIDOSCOPY    . TRANSURETHRAL RESECTION OF PROSTATE  07-2009   Social History   Occupational History  . Occupation: [employer]   Tobacco Use  . Smoking status: Never Smoker  . Smokeless tobacco: Never Used  Substance and Sexual Activity  . Alcohol use: No    Alcohol/week: 0.0 standard drinks  . Drug use: No  . Sexual activity: Yes

## 2019-10-16 ENCOUNTER — Telehealth: Payer: Self-pay | Admitting: Family

## 2019-10-16 NOTE — Telephone Encounter (Signed)
Patient called stating that he is still having trouble with swelling and wanted to know if that was normal.  CB#[phone].  Thank you.

## 2019-10-16 NOTE — Telephone Encounter (Signed)
I called and discussed surgical concerns with pt he is s/p cheilectomy about three weeks out. He is having numbness and swelling and c/o discoloration to the skin and advised that we can move his appt up to have NP eval. Pt voiced understanding and was in agreement to this plan.

## 2019-10-18 ENCOUNTER — Ambulatory Visit (INDEPENDENT_AMBULATORY_CARE_PROVIDER_SITE_OTHER): Payer: PPO | Admitting: Physician Assistant

## 2019-10-18 ENCOUNTER — Encounter: Payer: Self-pay | Admitting: Family Medicine

## 2019-10-18 ENCOUNTER — Encounter: Payer: Self-pay | Admitting: Family

## 2019-10-18 ENCOUNTER — Other Ambulatory Visit: Payer: Self-pay

## 2019-10-18 ENCOUNTER — Ambulatory Visit: Payer: PPO | Admitting: Family Medicine

## 2019-10-18 VITALS — Ht 72.0 in | Wt 209.0 lb

## 2019-10-18 VITALS — BP 124/82 | HR 83 | Ht 72.0 in | Wt 209.0 lb

## 2019-10-18 DIAGNOSIS — M19012 Primary osteoarthritis, left shoulder: Secondary | ICD-10-CM

## 2019-10-18 DIAGNOSIS — G8929 Other chronic pain: Secondary | ICD-10-CM

## 2019-10-18 DIAGNOSIS — M7502 Adhesive capsulitis of left shoulder: Secondary | ICD-10-CM

## 2019-10-18 DIAGNOSIS — M2022 Hallux rigidus, left foot: Secondary | ICD-10-CM

## 2019-10-18 DIAGNOSIS — M25512 Pain in left shoulder: Secondary | ICD-10-CM

## 2019-10-18 DIAGNOSIS — N411 Chronic prostatitis: Secondary | ICD-10-CM | POA: Diagnosis not present

## 2019-10-18 MED ORDER — MELOXICAM 7.5 MG PO TABS: 7.5000 mg | ORAL_TABLET | Freq: Every day | ORAL | 0 refills | Status: DC

## 2019-10-18 MED FILL — MELOXICAM 7.5 MG TABLET: 7.5 | 30 days supply | Qty: 30 | Fill #0

## 2019-10-18 NOTE — Assessment & Plan Note (Signed)
Adhesive capsulitis.  Continues to have decreased range of motion.  Discussed the possibility of injection which patient declined the patient would like to do formal physical therapy.  Continue the meloxicam and gabapentin and discussed medication management.  Follow-up with me again in 5 to 6 weeks.

## 2019-10-18 NOTE — Progress Notes (Signed)
[place] Phone: [phone] Subjective:   Erik [name], am serving as a scribe for Dr. Hulan Saas. This visit occurred during the SARS-CoV-2 public health emergency.  Safety protocols were in place, including screening questions prior to the visit, additional usage of staff PPE, and extensive cleaning of exam room while observing appropriate contact time as indicated for disinfecting solutions.   I'm seeing this patient by the request  of:  Colon Branch, MD  CC: Shoulder pain follow-up  [id number]   09/13/2019 Found on ultrasound today.  Seems to be moderately posttraumatic.  Discussed home exercises and icing regimen.  Patient is to increase activity as tolerated.  Follow-up again in 4 to 8 weeks.  Patient given injection today, tolerated the procedure well, hopefully this will help.  New problem for him at the moment.  Discussed bracing at night.  Follow-up again in 4 to 8 weeks  Patient appears to be having increasing concerns with this again.  This is not usually a chronic problem.  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  He does have the limited range of motion again.  We discussed the possibility of physical therapy but patient wants to avoid that at the moment.  Patient will increase activity slowly.  Given home exercises daily.  Follow-up again in 4 to 8 weeks   Update 10/18/2019 Erik [name] is a 64 y.o. male coming in with complaint of left shoulder pain. Patient states that he has limited ROM above 90. Intermittent stabbing pain. [name] relief from shoulder injection.  Patient states that the pain was improved with the injection but not the range of motion.  Thumb has improved.      Past Medical History:  Diagnosis Date  . ALLERGIC RHINITIS 12/19/2009  . Allergy   . Back pain chronic    On hydrocodone-Neurontin  . Blepharitis    B, chronic  . BPH (benign  prostatic hyperplasia)   . Chronic prostatitis     f/u by urology, has a prostate massage prn  . COVID-19 03/2019  . GERD (gastroesophageal reflux disease)   . Groin injury   . HYPOGONADISM, MALE 08/11/2007   f/u by Dr Thomasene Mohair urology    . OSTEOPOROSIS, IDIOPATHIC 08/11/2007   f/u at [facility] , now improved to osteopenia  . Raynaud's disease /phenomenon 08/16/2012  . Rosacea   . Shingles 09-2014  . Varicose veins    Past Surgical History:  Procedure Laterality Date  . BLEPHAROPLASTY     B blepharoplasty @ [facility] 08-2011  . CERVICAL DISCECTOMY  1998  . CERVICAL FUSION  1994   C4-C5-C6 fusion  . CHEILECTOMY Left 09/26/2019   Procedure: CHEILECTOMY LEFT GREAT TOE;  Surgeon: Newt Minion, MD;  Location: [facility];  Service: Orthopedics;  Laterality: Left;  . COLONOSCOPY    . EYE SURGERY     eyelid surgery on both eyes  . HERNIA REPAIR Left 1979  . IR GENERIC HISTORICAL  03/17/2016   IR EMBO VENOUS NOT HEMORR HEMANG  INC GUIDE ROADMAPPING 03/17/2016 Greggory Keen, MD [facility] ULTRASOUND  . KNEE ARTHROSCOPY    . POLYPECTOMY    . SHOULDER SURGERY Right    early 2000s  . SIGMOIDOSCOPY    . TRANSURETHRAL RESECTION OF PROSTATE  07-2009   Social History   Socioeconomic History  . Marital status: Married    Spouse name: Not on file  . Number  of children: 3  . Years of education: Not on file  . Highest education level: Not on file  Occupational History  . Occupation: real state   Tobacco Use  . Smoking status: Never Smoker  . Smokeless tobacco: Never Used  Substance and Sexual Activity  . Alcohol use: [name]    Alcohol/week: 0.0 standard drinks  . Drug use: [name]  . Sexual activity: Yes  Other Topics Concern  . Not on file  Social History Narrative   Lives with wife.  They have three children.   Daughter is an Ship broker     Social Determinants of Radio broadcast assistant Strain:   . Difficulty of Paying Living Expenses:   Food Insecurity:   . Worried About Paediatric nurse in the Last Year:   . Arboriculturist in the Last Year:   Transportation Needs:   . Film/video editor (Medical):   Marland Kitchen Lack of Transportation (Non-Medical):   Physical Activity:   . Days of Exercise per Week:   . Minutes of Exercise per Session:   Stress:   . Feeling of Stress :   Social Connections:   . Frequency of Communication with Friends and Family:   . Frequency of Social Gatherings with Friends and Family:   . Attends Religious Services:   . Active Member of Clubs or Organizations:   . Attends Archivist Meetings:   Marland Kitchen Marital Status:    Allergies  Allergen Reactions  . Sulfa Drugs Cross Reactors Nausea And Vomiting   Family History  Problem Relation Age of Onset  . COPD Father        Died, 71  . Hypertension Father   . Stroke Father   . CAD Father   . Allergic rhinitis Father   . Diabetes Mother   . Hypertension Mother   . CAD Mother           . Colon polyps Mother   . Cancer Mother        Ovarian cancer  . Hypertension Sister   . High Cholesterol Sister   . Colon cancer Maternal Aunt   . Prostate cancer Neg Hx   . Esophageal cancer Neg Hx   . Rectal cancer Neg Hx   . Stomach cancer Neg Hx   . Pancreatic cancer Neg Hx   . Angioedema Neg Hx   . Asthma Neg Hx   . Eczema Neg Hx   . Immunodeficiency Neg Hx   . Urticaria Neg Hx     Current Outpatient Medications (Endocrine & Metabolic):  .  testosterone cypionate (DEPOTESTOTERONE CYPIONATE) 100 MG/ML injection, Inject into the muscle every 14 (fourteen) days. For IM use only  Current Outpatient Medications (Cardiovascular):  .  amLODipine (NORVASC) 2.5 MG tablet, Take 1 tablet (2.5 mg total) by mouth daily. (Patient taking differently: Take 2.5 mg by mouth daily. ) .  sildenafil (REVATIO) 20 MG tablet, TAKE 1 TABLET (20 MG TOTAL) BY MOUTH AS NEEDED FOR UP TO 30 DAYS. Marland Kitchen  tadalafil (CIALIS) 5 MG tablet, Take 5 mg by mouth daily.   Current Outpatient Medications (Analgesics):  .   meloxicam (MOBIC) 7.5 MG tablet, Take 1 tablet (7.5 mg total) by mouth daily. Marland Kitchen  oxyCODONE-acetaminophen (PERCOCET/ROXICET) 5-325 MG tablet, Take 1 tablet by mouth every 4 (four) hours as needed for severe pain.   Current Outpatient Medications (Other):  Marland Kitchen  B-D INTEGRA SYRINGE 23G X 1" 3 ML MISC,  .  gabapentin (  NEURONTIN) 300 MG capsule, TAKE 1 CAPSULE BY MOUTH AT BEDTIME .  hyoscyamine (LEVSIN SL) 0.125 MG SL tablet, Place 1 tablet (0.125 mg total) under the tongue every 4 (four) hours as needed. Take 1 tab every 4 to 6 hours, under the tongue as needed for abdominal spasms .  lidocaine (LINDAMANTLE) 3 % CREA cream, Apply 1 application topically as needed. .  lidocaine (XYLOCAINE) 5 % ointment, Apply 1 application topically as needed. .  Multiple Vitamin (MULTI-VITAMINS) TABS, Take by mouth. .  Omega-3 Fatty Acids (FISH OIL) 1000 MG CAPS, Take 1 capsule by mouth 2 (two) times daily. Marland Kitchen  omeprazole (PRILOSEC) 40 MG capsule, Use 1 capsule once or twice a day for heartburn and reflux .  OVER THE COUNTER MEDICATION, Take 1 tablet by mouth 2 (two) times daily. AREDS 2   Reviewed prior external information including notes and imaging from  primary care provider As well as notes that were available from care everywhere and other healthcare systems.  Past medical history, social, surgical and family history all reviewed in electronic medical record.  [name] pertanent information unless stated regarding to the chief complaint.   Review of Systems:  [name] headache, visual changes, nausea, vomiting, diarrhea, constipation, dizziness, abdominal pain, skin rash, fevers, chills, night sweats, weight loss, swollen lymph nodes, body aches, joint swelling, chest pain, shortness of breath, mood changes. POSITIVE muscle aches  Objective  Blood pressure 124/82, pulse 83, height 6' (1.829 m), weight 209 lb (94.8 kg), SpO2 99 %.   General: [name] apparent distress alert and oriented x3 mood and affect normal, dressed  appropriately.  HEENT: Pupils equal, extraocular movements intact  Respiratory: Patient's speak in full sentences and does not appear short of breath  Cardiovascular: [name] lower extremity edema, non tender, [name] erythema  Neuro: Cranial nerves II through XII are intact, neurovascularly intact in all extremities with 2+ DTRs and 2+ pulses.  Gait normal with good balance and coordination.  MSK: Left shoulder exam shows the patient does have limited range of motion in external rotation and forward flexion.  Patient has significant less pain and 5 out of 5 strength of rotator cuff noted  negativ crossovere test.   Impression and Recommendations:      The above documentation has been reviewed and is accurate and complete Lyndal Pulley, DO       Note: This dictation was prepared with Dragon dictation along with smaller phrase technology. Any transcriptional errors that result from this process are unintentional.

## 2019-10-18 NOTE — Assessment & Plan Note (Signed)
Patient did not respond well to the injection.  And has some capsulitis and referred to formal physical therapy secondary to no improvement.

## 2019-10-18 NOTE — Progress Notes (Signed)
Office Visit Note   Patient: Erik [name]           Date of Birth: May 20, 1956           MRN: [id number] Visit Date: 10/18/2019              Requested by: Colon Branch, [address] [place],  [place] [address] PCP: Colon Branch, MD  Chief Complaint  Patient presents with  . Left Foot - Routine Post Op    09/26/19 left GT cheilectomy       HPI: Patient is 3 weeks status post left great toe cheilectomy.  He is concerned that when he gets up in the morning he seems to notice some discoloration to his foot.  He is also concerned about the continued swelling.  He is wearing a compression stocking.  Not currently taking any medication with the exception of Tylenol.  The swelling is bothering him because he is trying hard to move the toe but the swelling is causing some difficulties.  He also has some associated tingling in his foot  Assessment & Plan: Visit Diagnoses: No diagnosis found.  Plan: I do not see any signs of infection.  I think this is inflammation.  I would like for him to start on a regular anti-inflammatory and I have called in some meloxicam for him.  He will continue to work on range of motion follow-up in 2 weeks  Follow-Up Instructions: No follow-ups on file.   Ortho Exam  Patient is alert, oriented, no adenopathy, well-dressed, normal affect, normal respiratory effort. Focused examination of his left great toe demonstrates well-healed surgical incision.  Moderate amount of soft tissue swelling great toe range of motion is mildly stiff but not terribly painful.  No surrounding cellulitis pulses palpable  Imaging: No results found. No images are attached to the encounter.  Labs: Lab Results  Component Value Date   HGBA1C 5.6 06/10/2018   ESRSEDRATE 2 05/05/2019   ESRSEDRATE 1 03/25/2017   ESRSEDRATE 8 02/13/2014   CRP 0.9 03/25/2017   LABURIC 4.0 02/13/2014     Lab Results  Component Value Date   ALBUMIN 4.2 09/07/2019   ALBUMIN 4.2  06/14/2019   ALBUMIN 4.4 06/10/2018   LABURIC 4.0 02/13/2014    Lab Results  Component Value Date   MG 2.1 03/25/2017   Lab Results  Component Value Date   VD25OH 31.59 03/25/2017   VD25OH 44 10/26/2011    No results found for: PREALBUMIN CBC EXTENDED Latest Ref Rng & Units 09/07/2019 06/14/2019 09/20/2017  WBC 4.0 - 10.5 K/uL 5.2 4.7 3.7(L)  RBC 4.22 - 5.81 Mil/uL 4.94 4.76 4.88  HGB 13.0 - 17.0 g/dL 15.4 14.6 15.2  HCT 39.0 - 52.0 % 44.6 43.1 43.6  PLT 150.0 - 400.0 K/uL 216.0 209.0 184.0  NEUTROABS 1.4 - 7.7 K/uL 3.1 2.6 2.0  LYMPHSABS 0.7 - 4.0 K/uL 1.5 1.6 1.2     Body mass index is 28.35 kg/m.  Orders:  No orders of the defined types were placed in this encounter.  Meds ordered this encounter  Medications  . meloxicam (MOBIC) 7.5 MG tablet    Sig: Take 1 tablet (7.5 mg total) by mouth daily.    Dispense:  30 tablet    Refill:  0     Procedures: No procedures performed  Clinical Data: No additional findings.  ROS:  All other systems negative, except as noted in the HPI. Review of  Systems  Objective: Vital Signs: Ht 6' (1.829 m)   Wt 209 lb (94.8 kg)   BMI 28.35 kg/m   Specialty Comments:  No specialty comments available.  PMFS History: Patient Active Problem List   Diagnosis Date Noted  . Hallux rigidus, left foot   . Trigger thumb of left hand 09/13/2019  . Arthritis of left acromioclavicular joint 09/13/2019  . Arthritis of carpometacarpal [facility]) joint of left thumb 12/06/2018  . Early stage glaucoma 07/26/2018  . Enlarged prostate 07/26/2018  . Raynaud's disease without gangrene 10/08/2017  . 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 Lumbar radiculopathy 03/25/2017  . Degenerative cervical disc 06/08/2016  . Degenerative lumbar disc 06/08/2016  . PCP NOTES >>>>>>>>>>>>>>>>>>>> 04/29/2015  . Binocular vision disorder with diplopia 10/01/2014  . 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  Herpes zoster-- 09-2014 09/05/2014  . Anal fissure 02/28/2014  . Chronic prostatitis 02/28/2014  . Abnormal prostate specific antigen 02/28/2014  . Gastroesophageal reflux disease without esophagitis 02/28/2014  . Ganglion of tendon 02/28/2014  . Incomplete bladder emptying 02/28/2014  . FOM (frequency of micturition) 02/28/2014  . Nonexudative age-related macular degeneration 02/12/2014  . 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 Annual physical exam 10/21/2011  . Neck -- back pain- UDS 09/18/2011  . Other allergic rhinitis 12/19/2009  . PELVIC PAIN, CHRONIC 04/03/2009  . Hypogonadism -- per urology 08/11/2007  . OSTEOPOROSIS, IDIOPATHIC 08/11/2007  . HEADACHE 08/11/2007   Past Medical History:  Diagnosis Date  . ALLERGIC RHINITIS 12/19/2009  . Allergy   . Back pain chronic    On hydrocodone-Neurontin  . Blepharitis    B, chronic  . BPH (benign prostatic hyperplasia)   . Chronic prostatitis     f/u by urology, has a prostate massage prn  . COVID-19 03/2019  . GERD (gastroesophageal reflux disease)   . Groin injury   . HYPOGONADISM, MALE 08/11/2007   f/u by Dr Thomasene Mohair urology    . OSTEOPOROSIS, IDIOPATHIC 08/11/2007   f/u at [facility] , now improved to osteopenia  . Raynaud's disease /phenomenon 08/16/2012  . Rosacea   . Shingles 09-2014  . Varicose veins     Family History  Problem Relation Age of Onset  . COPD Father        Died, 71  . Hypertension Father   . Stroke Father   . CAD Father   . Allergic rhinitis Father   . Diabetes Mother   . Hypertension Mother   . CAD Mother           . Colon polyps Mother   . Cancer Mother        Ovarian cancer  . Hypertension Sister   . High Cholesterol Sister   . Colon cancer Maternal Aunt   . Prostate cancer Neg Hx   . Esophageal cancer Neg Hx   . Rectal cancer Neg Hx   . Stomach cancer Neg Hx   . Pancreatic cancer Neg Hx   . Angioedema Neg Hx   . Asthma Neg Hx   . Eczema Neg Hx   . Immunodeficiency Neg Hx   .  Urticaria Neg Hx     Past Surgical History:  Procedure Laterality Date  . BLEPHAROPLASTY     B blepharoplasty @ [facility] 08-2011  . CERVICAL DISCECTOMY  1998  . CERVICAL FUSION  1994   C4-C5-C6 fusion  . CHEILECTOMY Left 09/26/2019   Procedure: CHEILECTOMY LEFT GREAT TOE;  Surgeon: Newt Minion, MD;  Location: [facility];  Service: Orthopedics;  Laterality: Left;  . COLONOSCOPY    . EYE SURGERY     eyelid surgery on both eyes  . HERNIA REPAIR Left 1979  . IR GENERIC HISTORICAL  03/17/2016   IR EMBO VENOUS NOT HEMORR HEMANG  INC GUIDE ROADMAPPING 03/17/2016 Greggory Keen, MD [facility] ULTRASOUND  . KNEE ARTHROSCOPY    . POLYPECTOMY    . SHOULDER SURGERY Right    early 2000s  . SIGMOIDOSCOPY    . TRANSURETHRAL RESECTION OF PROSTATE  07-2009   Social History   Occupational History  . Occupation: real state   Tobacco Use  . Smoking status: Never Smoker  . Smokeless tobacco: Never Used  Substance and Sexual Activity  . Alcohol use: No    Alcohol/week: 0.0 standard drinks  . Drug use: No  . Sexual activity: Yes

## 2019-10-18 NOTE — Patient Instructions (Addendum)
PT [facility] Plate See me again

## 2019-10-24 MED FILL — OMEPRAZOLE 40 MG CPDR: 40 | 30 days supply | Qty: 60 | Fill #1

## 2019-10-25 ENCOUNTER — Ambulatory Visit: Payer: PPO | Attending: Family Medicine | Admitting: Physical Therapy

## 2019-10-25 ENCOUNTER — Encounter: Payer: Self-pay | Admitting: Physical Therapy

## 2019-10-25 ENCOUNTER — Ambulatory Visit: Payer: PPO | Admitting: Family

## 2019-10-25 ENCOUNTER — Other Ambulatory Visit: Payer: Self-pay

## 2019-10-25 DIAGNOSIS — M6281 Muscle weakness (generalized): Secondary | ICD-10-CM | POA: Diagnosis not present

## 2019-10-25 DIAGNOSIS — G8929 Other chronic pain: Secondary | ICD-10-CM | POA: Insufficient documentation

## 2019-10-25 DIAGNOSIS — M25612 Stiffness of left shoulder, not elsewhere classified: Secondary | ICD-10-CM | POA: Diagnosis not present

## 2019-10-25 DIAGNOSIS — R29898 Other symptoms and signs involving the musculoskeletal system: Secondary | ICD-10-CM | POA: Insufficient documentation

## 2019-10-25 DIAGNOSIS — M25512 Pain in left shoulder: Secondary | ICD-10-CM | POA: Diagnosis not present

## 2019-10-25 NOTE — Therapy (Signed)
[facility] [address]  [place], [place], [address] Phone: [phone]   Fax:  [phone]  Physical Therapy Evaluation  Patient Details  Name: Erik [name] MRN: [id number] Date of Birth: 1956-05-23 Referring Provider (PT): Hulan Saas, DO   Encounter Date: 10/25/2019  PT End of Session - 10/25/19 1615    Visit Number  1    Number of Visits  7    Date for PT Re-Evaluation  12/06/19    Authorization Type  [employer]    PT Start Time  1532    PT Stop Time  1607    PT Time Calculation (min)  35 min    Activity Tolerance  Patient tolerated treatment well;Patient limited by pain    Behavior During Therapy  [facility] for tasks assessed/performed       Past Medical History:  Diagnosis Date  . ALLERGIC RHINITIS 12/19/2009  . Allergy   . Back pain chronic    On hydrocodone-Neurontin  . Blepharitis    B, chronic  . BPH (benign prostatic hyperplasia)   . Chronic prostatitis     f/u by urology, has a prostate massage prn  . COVID-19 03/2019  . GERD (gastroesophageal reflux disease)   . Groin injury   . HYPOGONADISM, MALE 08/11/2007   f/u by Dr Thomasene Mohair urology    . OSTEOPOROSIS, IDIOPATHIC 08/11/2007   f/u at [facility] , now improved to osteopenia  . Raynaud's disease /phenomenon 08/16/2012  . Rosacea   . Shingles 09-2014  . Varicose veins     Past Surgical History:  Procedure Laterality Date  . BLEPHAROPLASTY     B blepharoplasty @ [facility] 08-2011  . CERVICAL DISCECTOMY  1998  . CERVICAL FUSION  1994   C4-C5-C6 fusion  . CHEILECTOMY Left 09/26/2019   Procedure: CHEILECTOMY LEFT GREAT TOE;  Surgeon: Newt Minion, MD;  Location: [place];  Service: Orthopedics;  Laterality: Left;  . COLONOSCOPY    . EYE SURGERY     eyelid surgery on both eyes  . HERNIA REPAIR Left 1979  . IR GENERIC HISTORICAL  03/17/2016   IR EMBO VENOUS NOT HEMORR HEMANG  INC GUIDE ROADMAPPING 03/17/2016 Greggory Keen, MD  [facility]TR[facility]  . KNEE ARTHROSCOPY    . POLYPECTOMY    . SHOULDER SURGERY Right    early 2000s  . SIGMOIDOSCOPY    . TRANSURETHRAL RESECTION OF PROSTATE  07-2009    There were no vitals filed for this visit.   Subjective Assessment - 10/25/19 1535    Subjective  Patient reporting L shoulder pain of atleast 3 months duration without inciting trauma. Pain is located over the L lateral shoulder with occasional radiation down to fingers. Reports intermittent N/T down to fingers. Worse with lifting the arm and in PM. No different with changes in position. Nothing makes it better.    Pertinent History  Raynaud's. osteoporosis, GERD, chronic back pain, cervical fusion C4/5 and C5/6 1994, knee arthroscopy, R shoulder surgery, L foot surgery    Limitations  Lifting;House hold activities    Diagnostic tests  09/13/19 L shoulder xray: No fracture or dislocation of the left shoulder. Joint spaces are preserved.    Patient Stated Goals  ROM    Currently in Pain?  Yes    Pain Score  0-No pain    Pain Location  Shoulder    Pain Orientation  Left;Lateral    Pain Descriptors / Indicators  Dull  Pain Type  Acute pain;Chronic pain    Pain Radiating Towards  lateral arm to fingers         [facility] PT Assessment - 10/25/19 1539      Assessment   Medical Diagnosis  Chronic L shoulder pain    Referring Provider (PT)  Hulan Saas, DO    Onset Date/Surgical Date  07/27/19    Hand Dominance  Right    Next MD Visit  not scheduled    Prior Therapy  yes      Precautions   Precautions  None      Balance Screen   Has the patient fallen in the past 6 months  No    Has the patient had a decrease in activity level because of a fear of falling?   No    Is the patient reluctant to leave their home because of a fear of falling?   No      Home Film/video editor residence    Living Arrangements  Spouse/significant other    Available Help at Discharge  Family    Type of [place]      Prior Function   Level of [place]  Full time employment    Vocation Requirements  real estate agent    Leisure  fishing      Cognition   Overall Cognitive Status  Within Functional Limits for tasks assessed      Observation/Other Assessments   Focus on Therapeutic Outcomes (FOTO)   shoulder: 52 (48% limited, 33% predicted)      Sensation   Light Touch  Appears Intact      Coordination   Gross Motor Movements are Fluid and Coordinated  Yes      Posture/Postural Control   Posture/Postural Control  Postural limitations    Postural Limitations  Rounded Shoulders;Forward head      ROM / Strength   AROM / PROM / Strength  AROM;Strength;PROM      AROM   AROM Assessment Site  Shoulder    Right/Left Shoulder  Right;Left    Right Shoulder Flexion  142 Degrees    Right Shoulder ABduction  155 Degrees    Right Shoulder Internal Rotation  --   FIR T7   Right Shoulder External Rotation  --   FER T1   Left Shoulder Flexion  115 Degrees   mild pain   Left Shoulder ABduction  96 Degrees    Left Shoulder Internal Rotation  --   FIR T11 and mild pain   Left Shoulder External Rotation  --   FER to back of head; mild pain     Strength   Strength Assessment Site  Shoulder    Right/Left Shoulder  Right;Left    Right Shoulder Flexion  4/5    Right Shoulder ABduction  4/5    Right Shoulder Internal Rotation  4/5    Right Shoulder External Rotation  4+/5    Left Shoulder Flexion  4/5    Left Shoulder ABduction  4/5    Left Shoulder Internal Rotation  4/5    Left Shoulder External Rotation  4-/5      Palpation   Palpation comment  painful trigger point over L lateral deltoid and down the most lateral triceps                Objective measurements completed on examination: See above findings.  PT Education - 10/25/19 1615    Education Details  prognosis, POC, HEP    Person(s) Educated  Patient    Methods   Explanation;Demonstration;Tactile cues;Verbal cues;Handout    Comprehension  Verbalized understanding;Returned demonstration       PT Short Term Goals - 10/25/19 1622      PT SHORT TERM GOAL #1   Title  Patient to be independent with initial HEP.    Time  3    Period  Weeks    Status  New    Target Date  11/15/19        PT Long Term Goals - 10/25/19 1623      PT LONG TERM GOAL #1   Title  Patient to be independent with advanced HEP.    Time  6    Period  Weeks    Status  New    Target Date  12/06/19      PT LONG TERM GOAL #2   Title  Patient to demonstrate L shoulder AROM/PROM [facility] and without pain limiting.    Time  6    Period  Weeks    Status  New    Target Date  12/06/19      PT LONG TERM GOAL #3   Title  Patient to demonstrate L shoulder strength >/=4+/5.    Time  6    Period  Weeks    Status  New    Target Date  12/06/19      PT LONG TERM GOAL #4   Title  Patient to report 80% improvement in sleeping tolerance d/t improvement in pain.    Time  6    Period  Weeks    Status  New    Target Date  12/06/19      PT LONG TERM GOAL #5   Title  Patient to demonstrate overhead lifting with 5lbs in L hand without pain and with good scapular mechanics.    Time  6    Period  Weeks    Status  New    Target Date  12/06/19             Plan - 10/25/19 1616    Clinical Impression Statement  Patient is a 63y/o M presenting to OPPT with c/o L shoulder pain of at least 3 months duration. Pain occurs over the lateral shoulder with occasional radiation of pain and N/T down to the fingers. Worse at night and with overhead lifting. Patient today presenting with forward head and rounded posture, limited and painful L shoulder AAROM, decreased B shoulder strength, and palpable painful trigger point evident over L lateral deltoid and down the most lateral triceps. Educated patient on gentle AAROM HEP, who reported understanding. Would benefit from skilled PT services 1x/week  for 6 weeks to address aforementioned impairments.    Personal Factors and Comorbidities  Age;Comorbidity 3+;Fitness;Past/Current Experience;Time since onset of injury/illness/exacerbation    Comorbidities  Raynaud's. osteoporosis, GERD, chronic back pain, cervical fusion C4/5 and C5/6 1994, knee arthroscopy, R shoulder surgery    Examination-Activity Limitations  Sleep;Carry;Dressing;Hygiene/Grooming;Transfers;Lift;Reach Overhead    Examination-Participation Restrictions  Cleaning;Shop;Community Activity;Driving;Yard Work;Interpersonal Relationship;Laundry;Meal Prep    Stability/Clinical Decision Making  Stable/Uncomplicated    Clinical Decision Making  Low    Rehab Potential  Good    PT Frequency  1x / week    PT Duration  6 weeks    PT Treatment/Interventions  ADLs/Self Care Home Management;Cryotherapy;Electrical Stimulation;Iontophoresis 4mg /ml Dexamethasone;Moist Heat;Therapeutic exercise;Therapeutic activities;Functional mobility training;Ultrasound;Neuromuscular re-education;Cognitive  remediation;Patient/family education;Manual techniques;Vasopneumatic Device;Taping;Energy conservation;Dry needling;Passive range of motion;Scar mobilization    PT Next Visit Plan  reassess HEP; shoulder AAROM/PROM    Consulted and Agree with Plan of Care  Patient       Patient will benefit from skilled therapeutic intervention in order to improve the following deficits and impairments:  Hypomobility, Decreased activity tolerance, Decreased strength, Increased fascial restricitons, Impaired UE functional use, Pain, Improper body mechanics, Decreased range of motion, Impaired flexibility, Postural dysfunction  Visit Diagnosis: Chronic left shoulder pain  Stiffness of left shoulder, not elsewhere classified  Muscle weakness (generalized)  Other symptoms and signs involving the musculoskeletal system     Problem List Patient Active Problem List   Diagnosis Date Noted  . Hallux rigidus, left foot    . Trigger thumb of left hand 09/13/2019  . Arthritis of left acromioclavicular joint 09/13/2019  . Arthritis of carpometacarpal [facility]) joint of left thumb 12/06/2018  . Early stage glaucoma 07/26/2018  . Enlarged prostate 07/26/2018  . Raynaud's disease without gangrene 10/08/2017  . 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 Lumbar radiculopathy 03/25/2017  . Degenerative cervical disc 06/08/2016  . Degenerative lumbar disc 06/08/2016  . PCP NOTES >>>>>>>>>>>>>>>>>>>> 04/29/2015  . Binocular vision disorder with diplopia 10/01/2014  . 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 Herpes zoster-- 09-2014 09/05/2014  . Anal fissure 02/28/2014  . Chronic prostatitis 02/28/2014  . Abnormal prostate specific antigen 02/28/2014  . Gastroesophageal reflux disease without esophagitis 02/28/2014  . Ganglion of tendon 02/28/2014  . Incomplete bladder emptying 02/28/2014  . FOM (frequency of micturition) 02/28/2014  . Nonexudative age-related macular degeneration 02/12/2014  . 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 Annual physical exam 10/21/2011  . Neck -- back pain- UDS 09/18/2011  . Other allergic rhinitis 12/19/2009  . PELVIC PAIN, CHRONIC 04/03/2009  . Hypogonadism -- per urology 08/11/2007  . OSTEOPOROSIS, IDIOPATHIC 08/11/2007  . HEADACHE 08/11/2007    Janene Harvey, PT, DPT 10/25/19 4:27 PM    [facility] [address]  [place], [place], [address] Phone: [phone]   Fax:  [phone]  Name: Erik [name] MRN: [id number] Date of Birth: [birth year]

## 2019-10-27 ENCOUNTER — Ambulatory Visit: Payer: Self-pay | Admitting: Physician Assistant

## 2019-11-01 ENCOUNTER — Other Ambulatory Visit: Payer: Self-pay

## 2019-11-01 ENCOUNTER — Ambulatory Visit (INDEPENDENT_AMBULATORY_CARE_PROVIDER_SITE_OTHER): Payer: PPO | Admitting: Family

## 2019-11-01 ENCOUNTER — Encounter: Payer: Self-pay | Admitting: Family

## 2019-11-01 VITALS — Ht 72.0 in | Wt 209.0 lb

## 2019-11-01 DIAGNOSIS — M2022 Hallux rigidus, left foot: Secondary | ICD-10-CM

## 2019-11-01 NOTE — Progress Notes (Signed)
Office Visit Note  Patient: Erik [name]             Date of Birth: 04-06-1956           MRN: [id number]             PCP: Colon Branch, MD Referring: Colon Branch, MD Visit Date: 11/06/2019 Occupation: @GUAROCC @  Subjective:  Raynaud's   History of Present Illness: Erik [name] is a 64 y.o. male with history of Raynaud's, DDD, and osteoporosis.  He continues to take norvasc 2.5 mg 1 tablet daily as needed for management of Raynaud's. He denies any digital ulcerations or signs of gangrene.  He currently has adhesive capsulitis of left shoulder and is being followed by Dr. Tamala Julian.  He recently started physical therapy.  He had surgery for hallux rigidus on 09/26/19 by Dr. Sharol Given, and he is doing well without any complications.  He states he is followed by an endocrinologist, Dr. Gale Journey, at [facility] for osteoporosis.  He states he is now in the osteopenia range. He is taking vitamin D and calcium supplements.   Activities of Daily Living:  Patient reports morning stiffness for 5 minutes.   Patient Denies nocturnal pain.  Difficulty dressing/grooming: Denies Difficulty climbing stairs: Denies Difficulty getting out of chair: Denies Difficulty using hands for taps, buttons, cutlery, and/or writing: Denies  Review of Systems  Constitutional: Negative for fatigue and night sweats.  HENT: Negative for mouth sores, mouth dryness and nose dryness.   Eyes: Positive for dryness. Negative for redness and itching.  Respiratory: Negative for cough, hemoptysis, shortness of breath and difficulty breathing.   Cardiovascular: Negative for chest pain, palpitations, hypertension, irregular heartbeat and swelling in legs/feet.  Gastrointestinal: Negative for blood in stool, constipation and diarrhea.  Endocrine: Negative for increased urination.  Genitourinary: Negative for difficulty urinating and painful urination.  Musculoskeletal: Positive for arthralgias, joint pain, joint swelling and morning  stiffness. Negative for myalgias, muscle weakness, muscle tenderness and myalgias.  Skin: Positive for color change. Negative for rash, hair loss, nodules/bumps, redness, skin tightness, ulcers and sensitivity to sunlight.  Allergic/Immunologic: Negative for susceptible to infections.  Neurological: Negative for dizziness, fainting, headaches, memory loss, night sweats and weakness.  Hematological: Negative for bruising/bleeding tendency and swollen glands.  Psychiatric/Behavioral: Negative for depressed mood, confusion and sleep disturbance. The patient is not nervous/anxious.     PMFS History:  Patient Active Problem List   Diagnosis Date Noted  . Hallux rigidus, left foot   . Trigger thumb of left hand 09/13/2019  . Arthritis of left acromioclavicular joint 09/13/2019  . Arthritis of carpometacarpal [facility]) joint of left thumb 12/06/2018  . Early stage glaucoma 07/26/2018  . Enlarged prostate 07/26/2018  . Raynaud's disease without gangrene 10/08/2017  . 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 Lumbar radiculopathy 03/25/2017  . Degenerative cervical disc 06/08/2016  . Degenerative lumbar disc 06/08/2016  . PCP NOTES >>>>>>>>>>>>>>>>>>>> 04/29/2015  . Binocular vision disorder with diplopia 10/01/2014  . 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 Herpes zoster-- 09-2014 09/05/2014  . Anal fissure 02/28/2014  . Chronic prostatitis 02/28/2014  . Abnormal prostate specific antigen 02/28/2014  . Gastroesophageal reflux disease without esophagitis 02/28/2014  . Ganglion of tendon 02/28/2014  . Incomplete bladder emptying 02/28/2014  . FOM (frequency of micturition) 02/28/2014  . Nonexudative age-related macular degeneration 02/12/2014  . 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 Annual physical exam 10/21/2011  .  Neck -- back pain- UDS 09/18/2011  . Other allergic rhinitis 12/19/2009  . PELVIC PAIN, CHRONIC 04/03/2009  .  Hypogonadism -- per urology 08/11/2007  . OSTEOPOROSIS, IDIOPATHIC 08/11/2007  . HEADACHE 08/11/2007    Past Medical History:  Diagnosis Date  . ALLERGIC RHINITIS 12/19/2009  . Allergy   . Back pain chronic    On hydrocodone-Neurontin  . Blepharitis    B, chronic  . BPH (benign prostatic hyperplasia)   . Chronic prostatitis     f/u by urology, has a prostate massage prn  . COVID-19 03/2019  . GERD (gastroesophageal reflux disease)   . Groin injury   . HYPOGONADISM, MALE 08/11/2007   f/u by Dr Thomasene Mohair urology    . OSTEOPOROSIS, IDIOPATHIC 08/11/2007   f/u at [facility] , now improved to osteopenia  . Raynaud's disease /phenomenon 08/16/2012  . Rosacea   . Shingles 09-2014  . Varicose veins     Family History  Problem Relation Age of Onset  . COPD Father        Died, 71  . Hypertension Father   . Stroke Father   . CAD Father   . Allergic rhinitis Father   . Diabetes Mother   . Hypertension Mother   . CAD Mother           . Colon polyps Mother   . Cancer Mother        Ovarian cancer  . Hypertension Sister   . High Cholesterol Sister   . Colon cancer Maternal Aunt   . Prostate cancer Neg Hx   . Esophageal cancer Neg Hx   . Rectal cancer Neg Hx   . Stomach cancer Neg Hx   . Pancreatic cancer Neg Hx   . Angioedema Neg Hx   . Asthma Neg Hx   . Eczema Neg Hx   . Immunodeficiency Neg Hx   . Urticaria Neg Hx    Past Surgical History:  Procedure Laterality Date  . BLEPHAROPLASTY     B blepharoplasty @ [facility] 08-2011  . CERVICAL DISCECTOMY  1998  . CERVICAL FUSION  1994   C4-C5-C6 fusion  . CHEILECTOMY Left 09/26/2019   Procedure: CHEILECTOMY LEFT GREAT TOE;  Surgeon: Newt Minion, MD;  Location: [place];  Service: Orthopedics;  Laterality: Left;  . COLONOSCOPY    . EYE SURGERY     eyelid surgery on both eyes  . HERNIA REPAIR Left 1979  . IR GENERIC HISTORICAL  03/17/2016   IR EMBO VENOUS NOT HEMORR HEMANG  INC GUIDE ROADMAPPING 03/17/2016 Greggory Keen, MD  [facility]TR[facility]  . KNEE ARTHROSCOPY    . POLYPECTOMY    . SHOULDER SURGERY Right    early 2000s  . SIGMOIDOSCOPY    . TRANSURETHRAL RESECTION OF PROSTATE  07-2009   Social History   Social History Narrative   Lives with wife.  They have three children.   Daughter is an Engineer, maintenance (IT) History  Administered Date(s) Administered  . Fluad Quad(high Dose 65+) 04/14/2019  . H1N1 07/09/2008  . Hepatitis A 10/12/2007, 04/12/2008  . Hepatitis B 10/12/2007, 11/07/2007, 03/26/2008  . Influenza Split 04/23/2011  . Influenza Whole 04/12/2008, 04/03/2009  . Influenza,inj,Quad PF,6+ Mos 04/24/2013, 05/08/2014, 04/12/2015, 03/30/2016, 04/23/2017, 04/15/2018  . Pneumococcal Conjugate-13 05/08/2019  . Td 07/06/2001  . Tdap 10/21/2011  . Zoster 04/24/2013  . Zoster Recombinat (Shingrix) 06/25/2017, 09/03/2017     Objective: Vital Signs: BP 118/77 (BP Location: Left Arm, Patient Position: Sitting,  Cuff Size: Normal)   Pulse 77   Resp 15   Ht 6' (1.829 m)   Wt 214 lb 12.8 oz (97.4 kg)   BMI 29.13 kg/m    Physical Exam Vitals and nursing note reviewed.  Constitutional:      Appearance: He is well-developed.  HENT:     Head: Normocephalic and atraumatic.  Eyes:     Conjunctiva/sclera: Conjunctivae normal.     Pupils: Pupils are equal, round, and reactive to light.  Pulmonary:     Effort: Pulmonary effort is normal.  Abdominal:     General: Bowel sounds are normal.     Palpations: Abdomen is soft.  Musculoskeletal:     Cervical back: Normal range of motion and neck supple.  Skin:    General: Skin is warm and dry.     Capillary Refill: Capillary refill takes less than 2 seconds.     Comments: No nailbed capillary changes.  No digital ulcerations or signs of gangrene.    Neurological:     Mental Status: He is alert and oriented to person, place, and time.  Psychiatric:        Behavior: Behavior normal.      Musculoskeletal Exam: C-spine, thoracic spine, and  lumbar spine good ROM.  Right shoulder has full ROM with no discomfort.  Left shoulder has limited ROM.  Elbow joints, wrist joints, MCPs, PIPs, and DIPs good ROM with no synovitis. Thickening of the left CMC joint. Complete fist formation bilaterally.  Hip joints, knee joints, and ankle joints good ROM with no discomfort.  No warmth or effusion of knee joints noted. No tenderness or inflammation of ankle joints.  CDAI Exam: CDAI Score: -- Patient Global: --; Provider Global: -- Swollen: --; Tender: -- Joint Exam 11/06/2019   No joint exam has been documented for this visit   There is currently no information documented on the homunculus. Go to the Rheumatology activity and complete the homunculus joint exam.  Investigation: No additional findings.  Imaging: No results found.  Recent Labs: Lab Results  Component Value Date   WBC 5.2 09/07/2019   HGB 15.4 09/07/2019   PLT 216.0 09/07/2019   NA 140 09/07/2019   K 3.9 09/07/2019   CL 103 09/07/2019   CO2 31 09/07/2019   GLUCOSE 115 (H) 09/07/2019   BUN 15 09/07/2019   CREATININE 0.83 09/07/2019   BILITOT 1.1 09/07/2019   ALKPHOS 64 09/07/2019   AST 19 09/07/2019   ALT 20 09/07/2019   PROT 6.4 09/07/2019   ALBUMIN 4.2 09/07/2019   CALCIUM 9.2 09/07/2019   GFRAA 151 04/12/2008    Speciality Comments: No specialty comments available.  Procedures:  No procedures performed Allergies: Sulfa drugs cross reactors   Assessment / Plan:     Visit Diagnoses: Raynaud's disease without gangrene: He has intermittent symptoms of Raynaud's.  No digital ulcerations or signs of gangrene.  No nailbed capillary changes. He takes norvasc 2.5 mg 1 tablet daily as needed for symptomatic relief.  A refill of norvasc was sent to the pharmacy.  He has not developed any new or worsening symptoms. He will follow up in 6 months.   Arthritis of carpometacarpal (CMC) joint of left thumb: He has thickening of the left CMC joint. He is not having any  tenderness or inflammation at this time.   Degenerative cervical disc: He has good ROM with no discomfort.  He has no symptoms of radiculopathy.   Degenerative lumbar disc: He has good ROM  with no discomfort.  No midline spinal tenderness.  He has no symptoms of radiculopathy. He has intermittent discomfort.   Adhesive capsulitis of left shoulder: He is followed by Dr. Tamala Julian. He declined a cortisone injection on 10/18/19 at his most recent office visit. He recently started going to formal physical therapy. He continues to take meloxicam and gabapentin as prescribed.    Paralysis of right common peroneal nerve  Idiopathic osteoporosis:  He is followed by Dr. Gale Journey (endocrinologist) at [facility].  His most recent office visit was on 08/15/19.  DEXA updated on 08/15/19: stable at lumber spine and proximal femur-osteopenia according to Dr. Horris Latino office visit note.  His previous osteoporosis treatment history includes: alendronate 1999-2003, teriparatide 2003-2005, risedronate 2005-2010, risedronate 2015-2018.   He is taking calcium and vitamin D supplements as recommended.     Vitamin D deficiency: He is taking a calcium and vitamin D supplement.   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  Meds ordered this encounter  Medications  . amLODipine (NORVASC) 2.5 MG tablet    Sig: Take 1 tablet (2.5 mg total) by mouth daily.    Dispense:  90 tablet    Refill:  1      Follow-Up Instructions: Return in about 1 year (around 11/05/2020) for Raynaud's syndrome, DDD.   Hazel Sams, PA-C  I examined and evaluated the patient with Hazel Sams PA.  Patient had no synovitis on examination.  Besides Raynaud's he had no other clinical features of  autoimmune disease.  He takes amlodipine on as needed basis.  The plan of care was discussed as noted above.  Bo Merino, MD  Note - This record has been created using Editor, commissioning.  Chart creation errors have been sought, but may not always  have been located. Such creation errors do not reflect on  the standard of medical care.

## 2019-11-01 NOTE — Progress Notes (Signed)
Office Visit Note   Patient: Erik [name]           Date of Birth: May 30, 1956           MRN: [id number] Visit Date: 11/01/2019              Requested by: Colon Branch, [address] [place],  [place] [address] PCP: Colon Branch, MD  Chief Complaint  Patient presents with  . Left Foot - Routine Post Op    09/26/19 left GT cheilectomy       HPI: Patient is 5 weeks status post left great toe cheilectomy.  He is also concerned about the continued swelling.  He is wearing a compression stocking.  The swelling is bothering him because he is trying hard to move the toe but the swelling is causing some difficulties.  He also has some associated shooting pain and numbness, tingling in his foot. Worse at end of day, occasionally wakes from sleep.   Assessment & Plan: Visit Diagnoses:  1. Hallux rigidus, left foot     Plan: Continues to have issues with swelling discussed course of this following surgery.  He will continue with his compression garments encouraged him to increase the compression that he is wearing.  States has tighter ones.  He will continue working on range of motion of the great toe discussed stiff walking shoes he states he plans to obtain carbon fiber plates for his shoe wear.  Discussed possibility of gabapentin increasing his edema of his lower extremities he will wean off of this.  Follow-Up Instructions: Return in about 4 weeks (around 11/29/2019), or if symptoms worsen or fail to improve.   Ortho Exam  Patient is alert, oriented, no adenopathy, well-dressed, normal affect, normal respiratory effort. Focused examination of his left great toe demonstrates well-healed surgical incision.  Moderate amount of soft tissue swelling great toe range of motion is mildly stiff but not terribly painful.  No surrounding cellulitis pulses palpable  Imaging: No results found. No images are attached to the encounter.  Labs: Lab Results  Component Value Date     HGBA1C 5.6 06/10/2018   ESRSEDRATE 2 05/05/2019   ESRSEDRATE 1 03/25/2017   ESRSEDRATE 8 02/13/2014   CRP 0.9 03/25/2017   LABURIC 4.0 02/13/2014     Lab Results  Component Value Date   ALBUMIN 4.2 09/07/2019   ALBUMIN 4.2 06/14/2019   ALBUMIN 4.4 06/10/2018   LABURIC 4.0 02/13/2014    Lab Results  Component Value Date   MG 2.1 03/25/2017   Lab Results  Component Value Date   VD25OH 31.59 03/25/2017   VD25OH 44 10/26/2011    No results found for: PREALBUMIN CBC EXTENDED Latest Ref Rng & Units 09/07/2019 06/14/2019 09/20/2017  WBC 4.0 - 10.5 K/uL 5.2 4.7 3.7(L)  RBC 4.22 - 5.81 Mil/uL 4.94 4.76 4.88  HGB 13.0 - 17.0 g/dL 15.4 14.6 15.2  HCT 39.0 - 52.0 % 44.6 43.1 43.6  PLT 150.0 - 400.0 K/uL 216.0 209.0 184.0  NEUTROABS 1.4 - 7.7 K/uL 3.1 2.6 2.0  LYMPHSABS 0.7 - 4.0 K/uL 1.5 1.6 1.2     Body mass index is 28.35 kg/m.  Orders:  No orders of the defined types were placed in this encounter.  No orders of the defined types were placed in this encounter.    Procedures: No procedures performed  Clinical Data: No additional findings.  ROS:  All other systems negative, except as noted  in the HPI. Review of Systems  Constitutional: Negative for chills and fever.  Cardiovascular: Positive for leg swelling.    Objective: Vital Signs: Ht 6' (1.829 m)   Wt 209 lb (94.8 kg)   BMI 28.35 kg/m   Specialty Comments:  No specialty comments available.  PMFS History: Patient Active Problem List   Diagnosis Date Noted  . Hallux rigidus, left foot   . Trigger thumb of left hand 09/13/2019  . Arthritis of left acromioclavicular joint 09/13/2019  . Arthritis of carpometacarpal [facility]) joint of left thumb 12/06/2018  . Early stage glaucoma 07/26/2018  . Enlarged prostate 07/26/2018  . Raynaud's disease without gangrene 10/08/2017  . 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 Lumbar radiculopathy 03/25/2017  . Degenerative cervical disc 06/08/2016  .  Degenerative lumbar disc 06/08/2016  . PCP NOTES >>>>>>>>>>>>>>>>>>>> 04/29/2015  . Binocular vision disorder with diplopia 10/01/2014  . 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 Herpes zoster-- 09-2014 09/05/2014  . Anal fissure 02/28/2014  . Chronic prostatitis 02/28/2014  . Abnormal prostate specific antigen 02/28/2014  . Gastroesophageal reflux disease without esophagitis 02/28/2014  . Ganglion of tendon 02/28/2014  . Incomplete bladder emptying 02/28/2014  . FOM (frequency of micturition) 02/28/2014  . Nonexudative age-related macular degeneration 02/12/2014  . 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 Annual physical exam 10/21/2011  . Neck -- back pain- UDS 09/18/2011  . Other allergic rhinitis 12/19/2009  . PELVIC PAIN, CHRONIC 04/03/2009  . Hypogonadism -- per urology 08/11/2007  . OSTEOPOROSIS, IDIOPATHIC 08/11/2007  . HEADACHE 08/11/2007   Past Medical History:  Diagnosis Date  . ALLERGIC RHINITIS 12/19/2009  . Allergy   . Back pain chronic    On hydrocodone-Neurontin  . Blepharitis    B, chronic  . BPH (benign prostatic hyperplasia)   . Chronic prostatitis     f/u by urology, has a prostate massage prn  . COVID-19 03/2019  . GERD (gastroesophageal reflux disease)   . Groin injury   . HYPOGONADISM, MALE 08/11/2007   f/u by Dr Thomasene Mohair urology    . OSTEOPOROSIS, IDIOPATHIC 08/11/2007   f/u at [facility] , now improved to osteopenia  . Raynaud's disease /phenomenon 08/16/2012  . Rosacea   . Shingles 09-2014  . Varicose veins     Family History  Problem Relation Age of Onset  . COPD Father        Died, 71  . Hypertension Father   . Stroke Father   . CAD Father   . Allergic rhinitis Father   . Diabetes Mother   . Hypertension Mother   . CAD Mother           . Colon polyps Mother   . Cancer Mother        Ovarian cancer  . Hypertension Sister   . High Cholesterol Sister   . Colon cancer Maternal Aunt   .  Prostate cancer Neg Hx   . Esophageal cancer Neg Hx   . Rectal cancer Neg Hx   . Stomach cancer Neg Hx   . Pancreatic cancer Neg Hx   . Angioedema Neg Hx   . Asthma Neg Hx   . Eczema Neg Hx   . Immunodeficiency Neg Hx   . Urticaria Neg Hx     Past Surgical History:  Procedure Laterality Date  . BLEPHAROPLASTY     B blepharoplasty @ [facility] 08-2011  . CERVICAL DISCECTOMY  1998  . CERVICAL  FUSION  1994   C4-C5-C6 fusion  . CHEILECTOMY Left 09/26/2019   Procedure: CHEILECTOMY LEFT GREAT TOE;  Surgeon: Newt Minion, MD;  Location: [facility];  Service: Orthopedics;  Laterality: Left;  . COLONOSCOPY    . EYE SURGERY     eyelid surgery on both eyes  . HERNIA REPAIR Left 1979  . IR GENERIC HISTORICAL  03/17/2016   IR EMBO VENOUS NOT HEMORR HEMANG  INC GUIDE ROADMAPPING 03/17/2016 Greggory Keen, MD [facility] ULTRASOUND  . KNEE ARTHROSCOPY    . POLYPECTOMY    . SHOULDER SURGERY Right    early 2000s  . SIGMOIDOSCOPY    . TRANSURETHRAL RESECTION OF PROSTATE  07-2009   Social History   Occupational History  . Occupation: [employer]   Tobacco Use  . Smoking status: Never Smoker  . Smokeless tobacco: Never Used  Substance and Sexual Activity  . Alcohol use: No    Alcohol/week: 0.0 standard drinks  . Drug use: No  . Sexual activity: Yes

## 2019-11-06 ENCOUNTER — Ambulatory Visit: Payer: PPO | Admitting: Rheumatology

## 2019-11-06 ENCOUNTER — Other Ambulatory Visit: Payer: Self-pay

## 2019-11-06 ENCOUNTER — Encounter: Payer: Self-pay | Admitting: Physical Therapy

## 2019-11-06 ENCOUNTER — Encounter: Payer: Self-pay | Admitting: Physician Assistant

## 2019-11-06 ENCOUNTER — Ambulatory Visit: Payer: PPO | Attending: Family Medicine | Admitting: Physical Therapy

## 2019-11-06 VITALS — BP 118/77 | HR 77 | Resp 15 | Ht 72.0 in | Wt 214.8 lb

## 2019-11-06 DIAGNOSIS — M5030 Other cervical disc degeneration, unspecified cervical region: Secondary | ICD-10-CM | POA: Diagnosis not present

## 2019-11-06 DIAGNOSIS — I7300 Raynaud's syndrome without gangrene: Secondary | ICD-10-CM | POA: Diagnosis not present

## 2019-11-06 DIAGNOSIS — H40023 Open angle with borderline findings, high risk, bilateral: Secondary | ICD-10-CM | POA: Diagnosis not present

## 2019-11-06 DIAGNOSIS — M25512 Pain in left shoulder: Secondary | ICD-10-CM | POA: Insufficient documentation

## 2019-11-06 DIAGNOSIS — G8929 Other chronic pain: Secondary | ICD-10-CM | POA: Insufficient documentation

## 2019-11-06 DIAGNOSIS — M25612 Stiffness of left shoulder, not elsewhere classified: Secondary | ICD-10-CM | POA: Insufficient documentation

## 2019-11-06 DIAGNOSIS — M51369 Other intervertebral disc degeneration, lumbar region without mention of lumbar back pain or lower extremity pain: Secondary | ICD-10-CM

## 2019-11-06 DIAGNOSIS — R29898 Other symptoms and signs involving the musculoskeletal system: Secondary | ICD-10-CM | POA: Diagnosis not present

## 2019-11-06 DIAGNOSIS — M5136 Other intervertebral disc degeneration, lumbar region: Secondary | ICD-10-CM

## 2019-11-06 DIAGNOSIS — E559 Vitamin D deficiency, unspecified: Secondary | ICD-10-CM

## 2019-11-06 DIAGNOSIS — Z872 Personal history of diseases of the skin and subcutaneous tissue: Secondary | ICD-10-CM | POA: Diagnosis not present

## 2019-11-06 DIAGNOSIS — M6281 Muscle weakness (generalized): Secondary | ICD-10-CM | POA: Insufficient documentation

## 2019-11-06 DIAGNOSIS — M1812 Unilateral primary osteoarthritis of first carpometacarpal joint, left hand: Secondary | ICD-10-CM | POA: Diagnosis not present

## 2019-11-06 DIAGNOSIS — H353190 Nonexudative age-related macular degeneration, unspecified eye, stage unspecified: Secondary | ICD-10-CM

## 2019-11-06 DIAGNOSIS — M7502 Adhesive capsulitis of left shoulder: Secondary | ICD-10-CM

## 2019-11-06 DIAGNOSIS — G5731 Lesion of lateral popliteal nerve, right lower limb: Secondary | ICD-10-CM | POA: Diagnosis not present

## 2019-11-06 DIAGNOSIS — M818 Other osteoporosis without current pathological fracture: Secondary | ICD-10-CM

## 2019-11-06 MED ORDER — AMLODIPINE BESYLATE 2.5 MG PO TABS: 2.5000 mg | ORAL_TABLET | Freq: Every day | ORAL | 1 refills | Status: AC

## 2019-11-06 MED FILL — AMLODIPINE 2.5 MG TABLET: 2.5 | 90 days supply | Qty: 90 | Fill #0

## 2019-11-06 NOTE — Therapy (Addendum)
[facility] [address]  [place], [place], [address] Phone: [phone]   Fax:  [phone]  Physical Therapy Treatment  Patient Details  Name: Erik [name] MRN: [id number] Date of Birth: December 30, 1955 Referring Provider (PT): Hulan Saas, DO   Progress Note Reporting Period 10/25/19 to 11/06/19  See note below for Objective Data and Assessment of Progress/Goals.     Encounter Date: 11/06/2019  PT End of Session - 11/06/19 1816    Visit Number  2    Number of Visits  7    Date for PT Re-Evaluation  12/06/19    Authorization Type  [employer]    PT Start Time  1619    PT Stop Time  1700    PT Time Calculation (min)  41 min    Activity Tolerance  Patient tolerated treatment well;Patient limited by pain    Behavior During Therapy  [facility] for tasks assessed/performed       Past Medical History:  Diagnosis Date  . ALLERGIC RHINITIS 12/19/2009  . Allergy   . Back pain chronic    On hydrocodone-Neurontin  . Blepharitis    B, chronic  . BPH (benign prostatic hyperplasia)   . Chronic prostatitis     f/u by urology, has a prostate massage prn  . COVID-19 03/2019  . GERD (gastroesophageal reflux disease)   . Groin injury   . HYPOGONADISM, MALE 08/11/2007   f/u by Dr Thomasene Mohair urology    . OSTEOPOROSIS, IDIOPATHIC 08/11/2007   f/u at [facility] , now improved to osteopenia  . Raynaud's disease /phenomenon 08/16/2012  . Rosacea   . Shingles 09-2014  . Varicose veins     Past Surgical History:  Procedure Laterality Date  . BLEPHAROPLASTY     B blepharoplasty @ [facility] 08-2011  . CERVICAL DISCECTOMY  1998  . CERVICAL FUSION  1994   C4-C5-C6 fusion  . CHEILECTOMY Left 09/26/2019   Procedure: CHEILECTOMY LEFT GREAT TOE;  Surgeon: Newt Minion, MD;  Location: [facility];  Service: Orthopedics;  Laterality: Left;  . COLONOSCOPY    . EYE SURGERY     eyelid surgery on both eyes  . HERNIA REPAIR Left 1979   . IR GENERIC HISTORICAL  03/17/2016   IR EMBO VENOUS NOT HEMORR HEMANG  INC GUIDE ROADMAPPING 03/17/2016 Greggory Keen, MD [facility] ULTRASOUND  . KNEE ARTHROSCOPY    . POLYPECTOMY    . SHOULDER SURGERY Right    early 2000s  . SIGMOIDOSCOPY    . TRANSURETHRAL RESECTION OF PROSTATE  07-2009    There were no vitals filed for this visit.  Subjective Assessment - 11/06/19 1618    Subjective  "That one stick one is a little tough" speaking about HEP. Having an episode of LBP today.    Pertinent History  Raynaud's. osteoporosis, GERD, chronic back pain, cervical fusion C4/5 and C5/6 1994, knee arthroscopy, R shoulder surgery, L foot surgery    Diagnostic tests  09/13/19 L shoulder xray: No fracture or dislocation of the left shoulder. Joint spaces are preserved.    Patient Stated Goals  ROM    Currently in Pain?  Yes    Multiple Pain Sites  Yes    Pain Score  4    Pain Location  Back    Pain Orientation  Lower   midline   Pain Descriptors / Indicators  Sharp    Pain Type  Acute pain    Pain Radiating  Towards  down LEs                       [facility] Adult PT Treatment/Exercise - 11/06/19 0001      Exercises   Exercises  Shoulder      Shoulder Exercises: Supine   External Rotation  AAROM;Left;10 reps    External Rotation Limitations  with wand to tolerance; towel roll under elbow    Internal Rotation  AAROM;Left;10 reps    Internal Rotation Limitations  with wand to tolerance; towel roll under elbow    Flexion  AAROM;Left;10 reps    Flexion Limitations  with wand to tolerance   good ROM   ABduction  AAROM;Left;10 reps    ABduction Limitations  with wand to tolerance      Shoulder Exercises: Standing   External Rotation  Strengthening;Left;10 reps;Theraband    Theraband Level (Shoulder External Rotation)  Level 2 (Red)    External Rotation Limitations  towel roll under elbow    Internal Rotation  Strengthening;Left;10 reps;Theraband    Internal Rotation Limitations   towel roll under elbow    Other Standing Exercises  L shoulder pendulum with 5# x30" ant/pos, M/L, CW, CCW   cues to avoid AROM of shoulder     Shoulder Exercises: Pulleys   Flexion  3 minutes    Flexion Limitations  to tolerance    Scaption  3 minutes    Scaption Limitations  to tolerance      Shoulder Exercises: Stretch   Other Shoulder Stretches  L shoulder apley stretch with strap 10x3" each IR and ER      Manual Therapy   Manual Therapy  Soft tissue mobilization;Myofascial release    Manual therapy comments  supine/ R sidelying    Soft tissue mobilization  STM to L lateral deltoid, triceps, infraspinatus, LS- palpable taut bands of mulscle and painful trigger pts    Myofascial Release  manual TPR to L lateral deltoid, infrapinatus insertion, LS             PT Education - 11/06/19 1816    Education Details  update to HEP    Person(s) Educated  Patient    Methods  Explanation;Demonstration;Tactile cues;Verbal cues;Handout    Comprehension  Verbalized understanding;Returned demonstration       PT Short Term Goals - 11/06/19 1819      PT SHORT TERM GOAL #1   Title  Patient to be independent with initial HEP.    Time  3    Period  Weeks    Status  Achieved    Target Date  11/15/19        PT Long Term Goals - 11/06/19 1819      PT LONG TERM GOAL #1   Title  Patient to be independent with advanced HEP.    Time  6    Period  Weeks    Status  On-going      PT LONG TERM GOAL #2   Title  Patient to demonstrate L shoulder AROM/PROM WFL and without pain limiting.    Time  6    Period  Weeks    Status  On-going      PT LONG TERM GOAL #3   Title  Patient to demonstrate L shoulder strength >/=4+/5.    Time  6    Period  Weeks    Status  On-going      PT LONG TERM GOAL #4   Title  Patient to report 80% improvement in sleeping tolerance d/t improvement in pain.    Time  6    Period  Weeks    Status  On-going      PT LONG TERM GOAL #5   Title  Patient to  demonstrate overhead lifting with 5lbs in L hand without pain and with good scapular mechanics.    Time  6    Period  Weeks    Status  On-going            Plan - 11/06/19 1817    Clinical Impression Statement  Patient reporting episode of LBP today, independent of shoulder pain. Reporting difficulty with shoulder AAROM exercises. Thus, these exercises were reviewed, with addition of towel roll under L elbow to maintain neutral positioning of shoulder with IR/ER. Demonstrated good ROM with flexion and abduction and without c/o significant pain. Patient tolerated STM and TPR to L lateral deltoid, triceps, infraspinatus, and LS. Demonstrated palpable taut bands of muscle and painful trigger pts throughout these muscles. Educated patient on use of tennis ball for self- TPR release at home. Demonstrated good IR ROM with stretching, more limitation demonstrated with ER. Worked on RTC strengthening with patient demonstrating good focus and form; minor cueing required for alignment. Updated HEP with exercises that were well-tolerated today. Patient reported understanding and without complaints at end of session.    Comorbidities  Raynaud's. osteoporosis, GERD, chronic back pain, cervical fusion C4/5 and C5/6 1994, knee arthroscopy, R shoulder surgery    PT Treatment/Interventions  ADLs/Self Care Home Management;Cryotherapy;Electrical Stimulation;Iontophoresis '4mg'$ /ml Dexamethasone;Moist Heat;Therapeutic exercise;Therapeutic activities;Functional mobility training;Ultrasound;Neuromuscular re-education;Cognitive remediation;Patient/family education;Manual techniques;Vasopneumatic Device;Taping;Energy conservation;Dry needling;Passive range of motion;Scar mobilization    PT Next Visit Plan  progress shoulder stretching and strengthening    Consulted and Agree with Plan of Care  Patient       Patient will benefit from skilled therapeutic intervention in order to improve the following deficits and  impairments:  Hypomobility, Decreased activity tolerance, Decreased strength, Increased fascial restricitons, Impaired UE functional use, Pain, Improper body mechanics, Decreased range of motion, Impaired flexibility, Postural dysfunction  Visit Diagnosis: Chronic left shoulder pain  Stiffness of left shoulder, not elsewhere classified  Muscle weakness (generalized)  Other symptoms and signs involving the musculoskeletal system     Problem List Patient Active Problem List   Diagnosis Date Noted  . Hallux rigidus, left foot   . Trigger thumb of left hand 09/13/2019  . Arthritis of left acromioclavicular joint 09/13/2019  . Arthritis of carpometacarpal [facility]) joint of left thumb 12/06/2018  . Early stage glaucoma 07/26/2018  . Enlarged prostate 07/26/2018  . Raynaud's disease without gangrene 10/08/2017  . 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 Lumbar radiculopathy 03/25/2017  . Degenerative cervical disc 06/08/2016  . Degenerative lumbar disc 06/08/2016  . PCP NOTES >>>>>>>>>>>>>>>>>>>> 04/29/2015  . Binocular vision disorder with diplopia 10/01/2014  . 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 Herpes zoster-- 09-2014 09/05/2014  . Anal fissure 02/28/2014  . Chronic prostatitis 02/28/2014  . Abnormal prostate specific antigen 02/28/2014  . Gastroesophageal reflux disease without esophagitis 02/28/2014  . Ganglion of tendon 02/28/2014  . Incomplete bladder emptying 02/28/2014  . FOM (frequency of micturition) 02/28/2014  . Nonexudative age-related macular degeneration 02/12/2014  . 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 Annual physical exam 10/21/2011  . Neck -- back pain- UDS 09/18/2011  . Other allergic rhinitis 12/19/2009  . PELVIC PAIN, CHRONIC 04/03/2009  .  Hypogonadism -- per urology 08/11/2007  . OSTEOPOROSIS, IDIOPATHIC 08/11/2007  . HEADACHE 08/11/2007     Janene Harvey, PT, DPT 11/06/19  6:21 PM   [facility] [address]  [place], [place], [address] Phone: [phone]   Fax:  [phone]  Name: Erik [name] MRN: [id number] Date of Birth: January 10, 1956   PHYSICAL THERAPY DISCHARGE SUMMARY  Visits from Start of Care: 2  Current functional level related to goals / functional outcomes: Unable to assess; patient did not return d/t family emergency    Remaining deficits: Unable to assess   Education / Equipment: HEP  Plan: Patient agrees to discharge.  Patient goals were not met. Patient is being discharged due to not returning since the last visit.  ?????     Janene Harvey, PT, DPT 12/20/19 1:45 PM

## 2019-11-07 ENCOUNTER — Ambulatory Visit: Payer: PPO | Admitting: Physician Assistant

## 2019-11-07 ENCOUNTER — Other Ambulatory Visit (INDEPENDENT_AMBULATORY_CARE_PROVIDER_SITE_OTHER): Payer: PPO

## 2019-11-07 ENCOUNTER — Encounter: Payer: Self-pay | Admitting: Physician Assistant

## 2019-11-07 VITALS — BP 110/80 | HR 72 | Temp 98.2°F | Ht 70.75 in | Wt 213.1 lb

## 2019-11-07 DIAGNOSIS — M7918 Myalgia, other site: Secondary | ICD-10-CM

## 2019-11-07 DIAGNOSIS — K219 Gastro-esophageal reflux disease without esophagitis: Secondary | ICD-10-CM | POA: Diagnosis not present

## 2019-11-07 LAB — BASIC METABOLIC PANEL
BUN: 22 mg/dL (ref 6–23)
CO2: 28 mEq/L (ref 19–32)
Calcium: 9.1 mg/dL (ref 8.4–10.5)
Chloride: 101 mEq/L (ref 96–112)
Creatinine, Ser: 0.89 mg/dL (ref 0.40–1.50)
GFR: 85.63 mL/min (ref >60.00)
Glucose, Bld: 97 mg/dL (ref 70–99)
Potassium: 3.8 mEq/L (ref 3.5–5.1)
Sodium: 137 mEq/L (ref 135–145)

## 2019-11-07 NOTE — Progress Notes (Signed)
Chief Complaint: Follow-up right upper quadrant pain  HPI:    Erik [name] is a 64 year old male with a past medical history as listed below, known to Dr. Ardis Hughs, who returns to clinic today for follow-up of his right upper quadrant pain, reflux and buttock pain.     /18/2019 office visit with me for rectal pain.  At that time described a history of fissures and fistulas treated by [facility] gastroenterology in the past.  At that time anoscopy showed some purulent matter.  MRI of the pelvis with and without contrast was ordered for further abscess which was negative for fistula or abscess.  Patient was prescribed Cipro 500 twice daily and metronidazole 500 3 times daily for 7 days.    11/12/2017 Flex sigmoidoscopy for rectal pain/fullness and a normal pelvic MRI with known chronic prostatitis with findings of slightly irregular rectal mucosa which was biopsied and small external hemorrhoids.  Pathology with mild reactive changes.    09/07/2019 patient was seen in clinic and described that he was having frequent drainage in his throat which an allergist told him could be reflux.  Also complained of some broken ribs and some continued pain on the right upper quadrant.  Also complained of buttock pain more to the right side about 3 inches from his rectum.  At that time discussed drainage with the patient increase his omeprazole to 40 mg twice daily.  Explained if this did not help his symptoms then he may want to see an allergist again or follow-up with his PCP.  Order right upper quadrant ultrasound as well as CBC, CMP and lipase.  Also discussed buttock pain which did not seem GI in origin and rectal exam was unrevealing.  Recommend he follow-up with Charlann Boxer, DO for musculoskeletal origin.    09/13/2019 right upper quadrant ultrasound with no abnormality.  CBC, CMP and lipase were normal.    Today, the patient returns to clinic and tells me that the drainage in his throat is about 50% better since being  on the twice daily Omeprazole.  Tells me that he still will occasionally eat a meal that just "sets off my sinuses", this happens about once a day.    Also continues with pain in his buttock.  Tells me it has been there for years now and no one can figure out what it is.  He recently saw his urologist who does not think it has anything to do with them either.  It bothers him enough that he would like it further evaluated.    Flying to [facility] soon with his wife to do some hiking.    Denies fever, chills, change in bowel habits, weight loss or symptoms that awaken him from sleep.  Past Medical History:  Diagnosis Date  . ALLERGIC RHINITIS 12/19/2009  . Allergy   . Back pain chronic    On hydrocodone-Neurontin  . Blepharitis    B, chronic  . BPH (benign prostatic hyperplasia)   . Chronic prostatitis     f/u by urology, has a prostate massage prn  . COVID-19 03/2019  . GERD (gastroesophageal reflux disease)   . Groin injury   . HYPOGONADISM, MALE 08/11/2007   f/u by Dr Thomasene Mohair urology    . OSTEOPOROSIS, IDIOPATHIC 08/11/2007   f/u at [facility] , now improved to osteopenia  . Raynaud's disease /phenomenon 08/16/2012  . Rosacea   . Shingles 09-2014  . Varicose veins     Past Surgical History:  Procedure Laterality Date  .  BLEPHAROPLASTY     B blepharoplasty @ [facility] 08-2011  . CERVICAL DISCECTOMY  1998  . CERVICAL FUSION  1994   C4-C5-C6 fusion  . CHEILECTOMY Left 09/26/2019   Procedure: CHEILECTOMY LEFT GREAT TOE;  Surgeon: Newt Minion, MD;  Location: [place];  Service: Orthopedics;  Laterality: Left;  . COLONOSCOPY    . EYE SURGERY     eyelid surgery on both eyes  . HERNIA REPAIR Left 1979  . IR GENERIC HISTORICAL  03/17/2016   IR EMBO VENOUS NOT HEMORR HEMANG  INC GUIDE ROADMAPPING 03/17/2016 Greggory Keen, MD [facility] ULTRASOUND  . KNEE ARTHROSCOPY    . POLYPECTOMY    . SHOULDER SURGERY Right    early 2000s  . SIGMOIDOSCOPY    . TRANSURETHRAL RESECTION OF  PROSTATE  07-2009    Current Outpatient Medications  Medication Sig Dispense Refill  . amLODipine (NORVASC) 2.5 MG tablet Take 1 tablet (2.5 mg total) by mouth daily. 90 tablet 1  . B-D INTEGRA SYRINGE 23G X 1" 3 ML MISC     . gabapentin (NEURONTIN) 300 MG capsule TAKE 1 CAPSULE BY MOUTH AT BEDTIME 90 capsule 1  . hyoscyamine (LEVSIN SL) 0.125 MG SL tablet Place 1 tablet (0.125 mg total) under the tongue every 4 (four) hours as needed. Take 1 tab every 4 to 6 hours, under the tongue as needed for abdominal spasms 30 tablet 1  . lidocaine (LINDAMANTLE) 3 % CREA cream Apply 1 application topically as needed.    . meloxicam (MOBIC) 7.5 MG tablet Take 1 tablet (7.5 mg total) by mouth daily. 30 tablet 0  . Multiple Vitamin (MULTI-VITAMINS) TABS Take by mouth.    . Omega-3 Fatty Acids (FISH OIL) 1000 MG CAPS Take 1 capsule by mouth 2 (two) times daily.    Marland Kitchen omeprazole (PRILOSEC) 40 MG capsule Use 1 capsule once or twice a day for heartburn and reflux 60 capsule 5  . OVER THE COUNTER MEDICATION Take 1 tablet by mouth 2 (two) times daily. AREDS 2    . sildenafil (REVATIO) 20 MG tablet TAKE 1 TABLET (20 MG TOTAL) BY MOUTH AS NEEDED FOR UP TO 30 DAYS.    Marland Kitchen tadalafil (CIALIS) 5 MG tablet Take 5 mg by mouth daily.    Marland Kitchen testosterone cypionate (DEPOTESTOTERONE CYPIONATE) 100 MG/ML injection Inject into the muscle every 14 (fourteen) days. For IM use only     No current facility-administered medications for this visit.    Allergies as of 11/07/2019 - Review Complete 11/06/2019  Allergen Reaction Noted  . Sulfa drugs cross reactors Nausea And Vomiting 10/21/2011    Family History  Problem Relation Age of Onset  . COPD Father        Died, 71  . Hypertension Father   . Stroke Father   . CAD Father   . Allergic rhinitis Father   . Diabetes Mother   . Hypertension Mother   . CAD Mother           . Colon polyps Mother   . Cancer Mother        Ovarian cancer  . Hypertension Sister   . High  Cholesterol Sister   . Colon cancer Maternal Aunt   . Prostate cancer Neg Hx   . Esophageal cancer Neg Hx   . Rectal cancer Neg Hx   . Stomach cancer Neg Hx   . Pancreatic cancer Neg Hx   . Angioedema Neg Hx   . Asthma Neg Hx   .  Eczema Neg Hx   . Immunodeficiency Neg Hx   . Urticaria Neg Hx     Social History   Socioeconomic History  . Marital status: Married    Spouse name: Not on file  . Number of children: 3  . Years of education: Not on file  . Highest education level: Not on file  Occupational History  . Occupation: real state   Tobacco Use  . Smoking status: Never Smoker  . Smokeless tobacco: Never Used  Substance and Sexual Activity  . Alcohol use: No    Alcohol/week: 0.0 standard drinks  . Drug use: No  . Sexual activity: Yes  Other Topics Concern  . Not on file  Social History Narrative   Lives with wife.  They have three children.   Daughter is an Ship broker     Social Determinants of Radio broadcast assistant Strain:   . Difficulty of Paying Living Expenses:   Food Insecurity:   . Worried About Charity fundraiser in the Last Year:   . Arboriculturist in the Last Year:   Transportation Needs:   . Film/video editor (Medical):   Marland Kitchen Lack of Transportation (Non-Medical):   Physical Activity:   . Days of Exercise per Week:   . Minutes of Exercise per Session:   Stress:   . Feeling of Stress :   Social Connections:   . Frequency of Communication with Friends and Family:   . Frequency of Social Gatherings with Friends and Family:   . Attends Religious Services:   . Active Member of Clubs or Organizations:   . Attends Archivist Meetings:   Marland Kitchen Marital Status:   Intimate Partner Violence:   . Fear of Current or Ex-Partner:   . Emotionally Abused:   Marland Kitchen Physically Abused:   . Sexually Abused:     Review of Systems:    Constitutional: No weight loss, fever or chills Cardiovascular: No chest pain  Respiratory: No  SOB Gastrointestinal: See HPI and otherwise negative   Physical Exam:  Vital signs: BP 110/80 (BP Location: Left Arm, Patient Position: Sitting, Cuff Size: Normal)   Pulse 72   Temp 98.2 F (36.8 C)   Ht 5' 10.75" (1.797 m)   Wt 213 lb 2 oz (96.7 kg)   BMI 29.94 kg/m   Constitutional:   Pleasant Caucasian male appears to be in NAD, Well developed, Well nourished, alert and cooperative Respiratory: Respirations even and unlabored. Lungs clear to auscultation bilaterally.   No wheezes, crackles, or rhonchi.  Cardiovascular: Normal S1, S2. No MRG. Regular rate and rhythm. No peripheral edema, cyanosis or pallor.  Gastrointestinal:  Soft, nondistended, nontender. No rebound or guarding. Normal bowel sounds. No appreciable masses or hepatomegaly. Rectal:  Not performed.  Psychiatric: Demonstrates good judgement and reason without abnormal affect or behaviors.  RELEVANT LABS AND IMAGING: CBC    Component Value Date/Time   WBC 5.2 09/07/2019 1434   RBC 4.94 09/07/2019 1434   HGB 15.4 09/07/2019 1434   HCT 44.6 09/07/2019 1434   PLT 216.0 09/07/2019 1434   PLT 206 10/11/2007 0000   MCV 90.2 09/07/2019 1434   MCH 31.7 10/11/2007 0000   MCHC 34.6 09/07/2019 1434   RDW 13.5 09/07/2019 1434   LYMPHSABS 1.5 09/07/2019 1434   MONOABS 0.5 09/07/2019 1434   EOSABS 0.1 09/07/2019 1434   BASOSABS 0.0 09/07/2019 1434    CMP     Component Value Date/Time   NA 140  09/07/2019 1434   K 3.9 09/07/2019 1434   CL 103 09/07/2019 1434   CO2 31 09/07/2019 1434   GLUCOSE 115 (H) 09/07/2019 1434   BUN 15 09/07/2019 1434   CREATININE 0.83 09/07/2019 1434   CALCIUM 9.2 09/07/2019 1434   PROT 6.4 09/07/2019 1434   ALBUMIN 4.2 09/07/2019 1434   AST 19 09/07/2019 1434   ALT 20 09/07/2019 1434   ALKPHOS 64 09/07/2019 1434   BILITOT 1.1 09/07/2019 1434   GFRNONAA 125 04/12/2008 0000   GFRAA 151 04/12/2008 0000    Assessment: 1.  GERD: Allergist seems to think sinus drainage is related to  reflux, this is 50% better on Omeprazole 40 twice a day, will continue for now  2.  Buttock pain: Evaluated with rectal exam at last visit, this was normal other than an internal hemorrhoid, pain considered most likely musculoskeletal  Plan: 1.  Discussed buttock pain with the patient again.  Next up would be to do a CT of the pelvis for further eval.  Ordered this today. 2.  Continue Omeprazole 40 mg twice daily for the next 4 months, then decrease to once daily. 3.  Patient to follow in clinic per recommendations after imaging above with Dr. Ardis Hughs or myself.  Ellouise Newer, PA-C [facility] 11/07/2019, 2:37 PM  Cc: Colon Branch, MD

## 2019-11-07 NOTE — Patient Instructions (Signed)
If you are age 66 or older, your body mass index should be between 23-30. Your Body mass index is 29.94 kg/m. If this is out of the aforementioned range listed, please consider follow up with your Primary Care Provider.  If you are age 77 or younger, your body mass index should be between 19-25. Your Body mass index is 29.94 kg/m. If this is out of the aformentioned range listed, please consider follow up with your Primary Care Provider.   Your provider has requested that you go to the basement level for lab work before leaving today. Press "B" on the elevator. The lab is located at the first door on the left as you exit the elevator.  Continue Omeprazole twice daily for 4 months, then decrease to once daily.   You have been scheduled for a CT scan of the pelvis at [facility] ([address]---this is in the same building as [employer]).   You are scheduled on Thursday 11/16/19 at 1:30 pm. You should arrive 15 minutes prior to your appointment time for registration. Please follow the written instructions below on the day of your exam:  WARNING: IF YOU ARE ALLERGIC TO IODINE/X-RAY DYE, PLEASE NOTIFY RADIOLOGY IMMEDIATELY AT [phone]! YOU WILL BE GIVEN A 13 HOUR PREMEDICATION PREP.  1) Do not eat or drink anything after 9:30 am (4 hours prior to your test) 2) You have been given 2 bottles of oral contrast to drink. The solution may taste better if refrigerated, but do NOT add ice or any other liquid to this solution. Shake well before drinking.    Drink 1 bottle of contrast @ 11:30 am (2 hours prior to your exam)  You may take any medications as prescribed with a small amount of water, if necessary. If you take any of the following medications: METFORMIN, GLUCOPHAGE, GLUCOVANCE, AVANDAMET, RIOMET, FORTAMET, [place] MET, JANUMET, GLUMETZA or METAGLIP, you MAY be asked to HOLD this medication 48 hours AFTER the exam.  The purpose of you drinking the oral contrast is  to aid in the visualization of your intestinal tract. The contrast solution may cause some diarrhea. Depending on your individual set of symptoms, you may also receive an intravenous injection of x-ray contrast/dye. Plan on being at [facility] for 30 minutes or longer, depending on the type of exam you are having performed.  This test typically takes 30-45 minutes to complete.  If you have any questions regarding your exam or if you need to reschedule, you may call the CT department at [phone] between the hours of 8:00 am and 5:00 pm, Monday-Friday.  ________________________________________________________________

## 2019-11-08 ENCOUNTER — Telehealth: Payer: Self-pay | Admitting: Family Medicine

## 2019-11-08 NOTE — Telephone Encounter (Signed)
Patient called stating that he is having a "back pain episode" and is requesting to be seen tomorrow if possible. He is a patient of Dr Tamala Julian but would be fine seeing you (Dr Georgina Snell) if you would be willing to work him in. He said that the pain is not constant but very frequent and runs down both legs. It started Sunday and he has not had any improvement since then.  Please advise.

## 2019-11-08 NOTE — Telephone Encounter (Signed)
Called patient. Scheduled tomorrow at 9:45am.

## 2019-11-08 NOTE — Telephone Encounter (Signed)
Okay to work in.  There are several blocked spots on the schedule can put patient on 1 of those blocked spots.

## 2019-11-08 NOTE — Progress Notes (Signed)
I agree with the above note, plan 

## 2019-11-09 ENCOUNTER — Ambulatory Visit: Payer: PPO | Admitting: Family Medicine

## 2019-11-09 ENCOUNTER — Other Ambulatory Visit: Payer: Self-pay

## 2019-11-09 ENCOUNTER — Encounter: Payer: Self-pay | Admitting: Family Medicine

## 2019-11-09 VITALS — BP 112/76 | HR 70 | Ht 70.75 in | Wt 212.2 lb

## 2019-11-09 DIAGNOSIS — M5416 Radiculopathy, lumbar region: Secondary | ICD-10-CM | POA: Diagnosis not present

## 2019-11-09 MED ORDER — PREDNISONE 50 MG PO TABS: 50.0000 mg | ORAL_TABLET | Freq: Every day | ORAL | 0 refills | Status: DC

## 2019-11-09 MED ORDER — PREGABALIN 75 MG PO CAPS: 75.0000 mg | ORAL_CAPSULE | Freq: Two times a day (BID) | ORAL | 3 refills | Status: DC | PRN

## 2019-11-09 MED ORDER — HYDROCODONE-ACETAMINOPHEN 5-325 MG PO TABS: 1.0000 | ORAL_TABLET | Freq: Four times a day (QID) | ORAL | 0 refills | Status: DC | PRN

## 2019-11-09 MED FILL — PREGABALIN 75 MG CAPS: 75 | 30 days supply | Qty: 60 | Fill #0

## 2019-11-09 MED FILL — predniSONE 50 MG TABS: 50 | 5 days supply | Qty: 5 | Fill #0

## 2019-11-09 MED FILL — HYDROCODON-APAP 5-325: 5-325 | 4 days supply | Qty: 15 | Fill #0

## 2019-11-09 NOTE — Patient Instructions (Signed)
Thank you for coming in today. Plan for Prednisone, limited hydrocodone and switching from gabapentin to trial of lyrica.  OK to do Chiropractor.  Let me know if you want PT or we want to try an injection.  Let me know if you worsen.  Come back or go to the emergency room if you notice new weakness new numbness problems walking or bowel or bladder problems.   Radicular Pain Radicular pain is a type of pain that spreads from your back or neck along a spinal nerve. Spinal nerves are nerves that leave the spinal cord and go to the muscles. Radicular pain is sometimes called radiculopathy, radiculitis, or a pinched nerve. When you have this type of pain, you may also have weakness, numbness, or tingling in the area of your body that is supplied by the nerve. The pain may feel sharp and burning. Depending on which spinal nerve is affected, the pain may occur in the:  Neck area (cervical radicular pain). You may also feel pain, numbness, weakness, or tingling in the arms.  Mid-spine area (thoracic radicular pain). You would feel this pain in the back and chest. This type is rare.  Lower back area (lumbar radicular pain). You would feel this pain as low back pain. You may feel pain, numbness, weakness, or tingling in the buttocks or legs. Sciatica is a type of lumbar radicular pain that shoots down the back of the leg. Radicular pain occurs when one of the spinal nerves becomes irritated or squeezed (compressed). It is often caused by something pushing on a spinal nerve, such as one of the bones of the spine (vertebrae) or one of the round cushions between vertebrae (intervertebral disks). This can result from:  An injury.  Wear and tear or aging of a disk.  The growth of a bone spur that pushes on the nerve. Radicular pain often goes away when you follow instructions from your health care provider for relieving pain at home. Follow these instructions at home: Managing pain      If directed,  put ice on the affected area: ? Put ice in a plastic bag. ? Place a towel between your skin and the bag. ? Leave the ice on for 20 minutes, 2-3 times a day.  If directed, apply heat to the affected area as often as told by your health care provider. Use the heat source that your health care provider recommends, such as a moist heat pack or a heating pad. ? Place a towel between your skin and the heat source. ? Leave the heat on for 20-30 minutes. ? Remove the heat if your skin turns bright red. This is especially important if you are unable to feel pain, heat, or cold. You may have a greater risk of getting burned. Activity   Do not sit or rest in bed for long periods of time.  Try to stay as active as possible. Ask your health care provider what type of exercise or activity is best for you.  Avoid activities that make your pain worse, such as bending and lifting.  Do not lift anything that is heavier than 10 lb (4.5 kg), or the limit that you are told, until your health care provider says that it is safe.  Practice using proper technique when lifting items. Proper lifting technique involves bending your knees and rising up.  Do strength and range-of-motion exercises only as told by your health care provider or physical therapist. General instructions  Take over-the-counter and  prescription medicines only as told by your health care provider.  Pay attention to any changes in your symptoms.  Keep all follow-up visits as told by your health care provider. This is important. ? Your health care provider may send you to a physical therapist to help with this pain. Contact a health care provider if:  Your pain and other symptoms get worse.  Your pain medicine is not helping.  Your pain has not improved after a few weeks of home care.  You have a fever. Get help right away if:  You have severe pain, weakness, or numbness.  You have difficulty with bladder or bowel  control. Summary  Radicular pain is a type of pain that spreads from your back or neck along a spinal nerve.  When you have radicular pain, you may also have weakness, numbness, or tingling in the area of your body that is supplied by the nerve.  The pain may feel sharp or burning.  Radicular pain may be treated with ice, heat, medicines, or physical therapy. This information is not intended to replace advice given to you by your health care provider. Make sure you discuss any questions you have with your health care provider. Document Revised: 01/04/2018 Document Reviewed: 01/04/2018 Elsevier Patient Education  [employer].

## 2019-11-09 NOTE — Progress Notes (Signed)
I, [name], LAT, ATC, am serving as scribe for Dr. [name].  Erik [name] is a 64 y.o. male who presents to [place] at [facility] today for low back pain and B LE pain since Sunday.  He was last seen by Dr. Tamala Julian on 10/18/19 for his L shoulder.  Since then he reports that his pain is now mostly in his R LE.  He states that his pain shifts from his hamstrings to his quads.  He has a hx of a L4-5 discectomy in 1998.  He notes he has had episodes like this in the past that typically get home down with chiropractic care often prednisone and occasionally using hydrocodone.  He currently takes gabapentin chronically 300 mg at bedtime.  He has been advised to stop it recently following his toe surgery as there is some thought that that is causing some persistent swelling.  Radiating pain: yes into B LEs but now just in his R LE LE numbness/tingling: Yes LE weakness: No Aggravating factors: sitting; laying down in bed Treatments tried: Hydrocodone (leftover)  Diagnostic imaging: L-spine XR- 08/01/18  Pertinent review of systems: No fevers or chills  Relevant historical information: History of prior back surgery as above.   Exam:  BP 112/76 (BP Location: Left Arm, Patient Position: Sitting, Cuff Size: Large)   Pulse 70   Ht 5' 10.75" (1.797 m)   Wt 212 lb 3.2 oz (96.3 kg)   SpO2 98%   BMI 29.81 kg/m  General: Well Developed, well nourished, and in no acute distress.   MSK: L-spine normal-appearing Nontender midline. Decreased lumbar motion. Lower extremity strength reflexes and sensation are intact distally. Positive right-sided slump test mildly. Normal hip motion.    Lab and Radiology Results EXAM: MRI CERVICAL AND LUMBAR SPINE WITHOUT CONTRAST  TECHNIQUE: Multiplanar and multiecho pulse sequences of the cervical spine, to include the craniocervical junction and cervicothoracic junction, and lumbar spine, were obtained without intravenous  contrast.  COMPARISON:  MRI cervical spine 07/13/2005, cervical radiographs 06/08/2016  FINDINGS: MRI CERVICAL SPINE FINDINGS  Alignment: 4 mm retrolisthesis at C3-4 has progressed since the prior MRI. Remaining alignment is anatomic  Vertebrae: ACDF with hardware at C4-5 and C5-6. No fracture or mass.  Cord: Normal signal and morphology  Posterior Fossa, vertebral arteries, paraspinal tissues: Negative  Disc levels:  C2-3:  Negative  C3-4: 4 mm retrolisthesis with disc degeneration and diffuse uncinate spurring. Cord flattening and mild spinal stenosis. Moderate foraminal encroachment bilaterally which has progressed since 2007  C4-5:  ACDF.  Negative for stenosis  C5-6:  ACDF without stenosis.  C6-7: Moderate disc degeneration with disc space narrowing and uncinate spurring. Mild foraminal narrowing bilaterally. Negative for canal stenosis  C7-T1: Disc degeneration and spondylosis. Mild uncinate spurring without stenosis.  MRI LUMBAR SPINE FINDINGS  Segmentation:  Normal segmentation.  Lowest disc space L5-S1  Alignment:  Normal  Vertebrae: Negative for fracture or mass. Large hemangioma L2 vertebral body. 2 hemangiomata L1 vertebral body. Discogenic edema at C3-4. Schmorl's node with surrounding edema in the inferior endplate of [id number].  Conus medullaris: Extends to the L1-2 level and appears normal.  Paraspinal and other soft tissues: Negative for retroperitoneal mass or adenopathy. Paraspinous muscles symmetric.  Disc levels:  L1-2: Negative  L2-3: Disc degeneration with disc bulging and spurring. Shallow right paracentral disc protrusion indenting the dura without significant spinal stenosis. Mild facet degeneration bilaterally. Neural foramina patent.  L4-5: Right laminectomy. Advanced disc degeneration right greater than  left. Discogenic edema on the right with associated endplate spurring diffusely. Mild facet degeneration.  Mild right foraminal narrowing due to spurring. No significant canal stenosis.  L4-5: Disc bulging and endplate spurring. Bilateral facet degeneration. Mild subarticular stenosis on the left without nerve root compression.  L5-S1: Mild disc and facet degeneration without significant spinal stenosis.  IMPRESSION: Cervical spine results:  ACDF C4-5 and C5-6 without stenosis  4 mm retrolisthesis C3 on C4 with diffuse endplate uncinate spurring. Mild spinal stenosis and moderate foraminal encroachment bilaterally  Mild spondylosis C6-7 and C7-T1  Lumbar spine results:  Disc and facet degeneration L2-3 without significant stenosis  Right laminectomy L3-4.  Right foraminal narrowing due to spurring  Mild degenerative change at L4-5 and L5-S1.   Electronically Signed   By: Franchot Gallo M.D.   On: 07/07/2016 09:43 I, [name], personally (independently) visualized and performed the interpretation of the images attached in this note.      Assessment and Plan: 64 y.o. male with right leg pain and right low back pain consistent with lumbar radiculopathy.  Distribution is somewhat confusing.  Seems to be predominantly right L3 and possibly right L5. He has had episodes like this in the past and has done pretty well with chiropractic care prednisone and hydrocodone and gabapentin.  Plan to represcribe prednisone and limited refill hydrocodone.  Will transition from gabapentin to Lyrica as it may be a bit more effective.  Encourage either physical therapy or recheck with his chiropractor.  Let me know if he wants referral to physical therapy.  Next steps if not better could be epidural steroid injection at the level of worst pain based on dermatomal pattern.  Patient will communicate in the future if not improving.  Precautions reviewed patient expresses understanding and agreement.   PDMP reviewed during this encounter. No orders of the defined types were placed in  this encounter.  Meds ordered this encounter  Medications  . predniSONE (DELTASONE) 50 MG tablet    Sig: Take 1 tablet (50 mg total) by mouth daily.    Dispense:  5 tablet    Refill:  0  . pregabalin (LYRICA) 75 MG capsule    Sig: Take 1 capsule (75 mg total) by mouth 2 (two) times daily as needed.    Dispense:  60 capsule    Refill:  3  . HYDROcodone-acetaminophen (NORCO/VICODIN) 5-325 MG tablet    Sig: Take 1 tablet by mouth every 6 (six) hours as needed.    Dispense:  15 tablet    Refill:  0     Discussed warning signs or symptoms. Please see discharge instructions. Patient expresses understanding.   The above documentation has been reviewed and is accurate and complete [name]

## 2019-11-13 ENCOUNTER — Encounter: Payer: PPO | Admitting: Physical Therapy

## 2019-11-15 ENCOUNTER — Ambulatory Visit: Payer: PPO | Admitting: Physical Therapy

## 2019-11-16 ENCOUNTER — Other Ambulatory Visit: Payer: PPO

## 2019-11-17 ENCOUNTER — Other Ambulatory Visit: Payer: Self-pay

## 2019-11-17 ENCOUNTER — Ambulatory Visit (INDEPENDENT_AMBULATORY_CARE_PROVIDER_SITE_OTHER)
Admission: RE | Admit: 2019-11-17 | Discharge: 2019-11-17 | Disposition: A | Discharge status: Home / Self Care | Payer: PPO | Source: Ambulatory Visit | Attending: Physician Assistant | Admitting: Physician Assistant

## 2019-11-17 DIAGNOSIS — M7918 Myalgia, other site: Secondary | ICD-10-CM

## 2019-11-17 DIAGNOSIS — R102 Pelvic and perineal pain: Secondary | ICD-10-CM | POA: Diagnosis not present

## 2019-11-17 MED ORDER — IOHEXOL 300 MG/ML  SOLN
100.0000 mL | Freq: Once | INTRAMUSCULAR | Status: AC | PRN
  Administered 2019-11-17: 100 mL via INTRAVENOUS

## 2019-11-20 ENCOUNTER — Ambulatory Visit: Payer: PPO | Admitting: Physical Therapy

## 2019-11-21 ENCOUNTER — Ambulatory Visit: Payer: PPO | Admitting: Family Medicine

## 2019-11-21 DIAGNOSIS — E291 Testicular hypofunction: Secondary | ICD-10-CM | POA: Diagnosis not present

## 2019-11-21 DIAGNOSIS — N411 Chronic prostatitis: Secondary | ICD-10-CM | POA: Diagnosis not present

## 2019-11-30 MED FILL — OMEPRAZOLE 40 MG CPDR: 40 | 30 days supply | Qty: 60 | Fill #2

## 2019-12-08 DIAGNOSIS — M9902 Segmental and somatic dysfunction of thoracic region: Secondary | ICD-10-CM | POA: Diagnosis not present

## 2019-12-08 DIAGNOSIS — M5431 Sciatica, right side: Secondary | ICD-10-CM | POA: Diagnosis not present

## 2019-12-08 DIAGNOSIS — M9903 Segmental and somatic dysfunction of lumbar region: Secondary | ICD-10-CM | POA: Diagnosis not present

## 2019-12-08 DIAGNOSIS — M9901 Segmental and somatic dysfunction of cervical region: Secondary | ICD-10-CM | POA: Diagnosis not present

## 2019-12-12 DIAGNOSIS — M5431 Sciatica, right side: Secondary | ICD-10-CM | POA: Diagnosis not present

## 2019-12-12 DIAGNOSIS — M9901 Segmental and somatic dysfunction of cervical region: Secondary | ICD-10-CM | POA: Diagnosis not present

## 2019-12-12 DIAGNOSIS — M9903 Segmental and somatic dysfunction of lumbar region: Secondary | ICD-10-CM | POA: Diagnosis not present

## 2019-12-12 DIAGNOSIS — M9902 Segmental and somatic dysfunction of thoracic region: Secondary | ICD-10-CM | POA: Diagnosis not present

## 2019-12-15 DIAGNOSIS — M9903 Segmental and somatic dysfunction of lumbar region: Secondary | ICD-10-CM | POA: Diagnosis not present

## 2019-12-15 DIAGNOSIS — M9901 Segmental and somatic dysfunction of cervical region: Secondary | ICD-10-CM | POA: Diagnosis not present

## 2019-12-15 DIAGNOSIS — M5431 Sciatica, right side: Secondary | ICD-10-CM | POA: Diagnosis not present

## 2019-12-15 DIAGNOSIS — M9902 Segmental and somatic dysfunction of thoracic region: Secondary | ICD-10-CM | POA: Diagnosis not present

## 2019-12-19 MED FILL — BD INTEGRA SYRN 3 ML 23GX1: 23G X 1" | 84 days supply | Qty: 6 | Fill #1

## 2019-12-19 MED FILL — TESTOSTERONE CYP 200 MG/ML: 200 | 84 days supply | Qty: 6 | Fill #0

## 2019-12-20 ENCOUNTER — Ambulatory Visit: Payer: PPO | Admitting: Family

## 2019-12-20 ENCOUNTER — Other Ambulatory Visit: Payer: Self-pay

## 2019-12-20 ENCOUNTER — Encounter: Payer: Self-pay | Admitting: Family

## 2019-12-20 VITALS — Ht 70.75 in | Wt 212.2 lb

## 2019-12-20 DIAGNOSIS — M722 Plantar fascial fibromatosis: Secondary | ICD-10-CM

## 2019-12-20 DIAGNOSIS — M7672 Peroneal tendinitis, left leg: Secondary | ICD-10-CM | POA: Diagnosis not present

## 2019-12-20 NOTE — Progress Notes (Signed)
Office Visit Note   Patient: Erik [name]           Date of Birth: February 19, 1956           MRN: [id number] Visit Date: 12/20/2019              Requested by: Colon Branch, [address] [place],  [place] [address] PCP: Colon Branch, MD  Chief Complaint  Patient presents with  . Left Foot - Routine Post Op    09/26/19 left GT cheilectomy         HPI: The patient is a 64 year old-year-old man who presents today complaining of left foot pain.  He is status post cheilectomy of the left great toe on March 23 he has been in appropriate shoewear and has not been having much issue of pain of the cold cheilectomy unfortunately is having pain along the lateral column with weightbearing he feels he cannot bear weight due to exquisite pain.  He is also having some plantar heel pain exquisite especially if not wearing supportive shoe wear worse in his dress shoes than his Hoka's.  He does have some inserts that are flimsy for his work shoes very supportive arch support does not fit in his dress shoe.  He has tried anti-inflammatories intermittently without much relief.  Assessment & Plan: Visit Diagnoses:  1. Peroneal tendinitis of left lower extremity   2. Plantar fasciitis of left foot     Plan: Depo-Medrol injection of the left plantar fascia.  Discussed importance of heel cord stretching anti-inflammatories he will use his meloxicam daily for the next 2 weeks.  Use the Hoka's every day.  We will talk with Dr. Junius Roads about possible injection of the peroneal tendon sheath.  Follow-Up Instructions: No follow-ups on file.   Ortho Exam  Patient is alert, oriented, no adenopathy, well-dressed, normal affect, normal respiratory effort. On examination of the left foot there is no deformity no erythema no edema.  He does have tenderness to the origin of the plantar fascia.  This is exquisite.  Point tenderness.  No pain with lateral compression of the calcaneus.  There is also pain  along course of peroneal tendon.  Imaging: No results found. No images are attached to the encounter.  Labs: Lab Results  Component Value Date   HGBA1C 5.6 06/10/2018   ESRSEDRATE 2 05/05/2019   ESRSEDRATE 1 03/25/2017   ESRSEDRATE 8 02/13/2014   CRP 0.9 03/25/2017   LABURIC 4.0 02/13/2014     Lab Results  Component Value Date   ALBUMIN 4.2 09/07/2019   ALBUMIN 4.2 06/14/2019   ALBUMIN 4.4 06/10/2018   LABURIC 4.0 02/13/2014    Lab Results  Component Value Date   MG 2.1 03/25/2017   Lab Results  Component Value Date   VD25OH 31.59 03/25/2017   VD25OH 44 10/26/2011    No results found for: PREALBUMIN CBC EXTENDED Latest Ref Rng & Units 09/07/2019 06/14/2019 09/20/2017  WBC 4.0 - 10.5 K/uL 5.2 4.7 3.7(L)  RBC 4.22 - 5.81 Mil/uL 4.94 4.76 4.88  HGB 13.0 - 17.0 g/dL 15.4 14.6 15.2  HCT 39 - 52 % 44.6 43.1 43.6  PLT 150 - 400 K/uL 216.0 209.0 184.0  NEUTROABS 1.4 - 7.7 K/uL 3.1 2.6 2.0  LYMPHSABS 0.7 - 4.0 K/uL 1.5 1.6 1.2     Body mass index is 29.81 kg/m.  Orders:  Orders Placed This Encounter  Procedures  . Foot Inj   No  orders of the defined types were placed in this encounter.    Procedures: Foot Inj  Date/Time: 12/20/2019 4:18 PM Performed by: Suzan Slick, NP Authorized by: Suzan Slick, NP   Condition: Plantar Fasciitis   Location: left plantar fascia muscle      Clinical Data: No additional findings.  ROS:  All other systems negative, except as noted in the HPI. Review of Systems  Constitutional: Negative for chills and fever.  Musculoskeletal: Positive for arthralgias, gait problem and myalgias.    Objective: Vital Signs: Ht 5' 10.75" (1.797 m)   Wt 212 lb 3.2 Erik (96.3 kg)   BMI 29.81 kg/m   Specialty Comments:  No specialty comments available.  PMFS History: Patient Active Problem List   Diagnosis Date Noted  . Hallux rigidus, left foot   . Trigger thumb of left hand 09/13/2019  . Arthritis of left acromioclavicular  joint 09/13/2019  . Arthritis of carpometacarpal [facility]) joint of left thumb 12/06/2018  . Early stage glaucoma 07/26/2018  . Enlarged prostate 07/26/2018  . Raynaud's disease without gangrene 10/08/2017  . 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 Lumbar radiculopathy 03/25/2017  . Degenerative cervical disc 06/08/2016  . Degenerative lumbar disc 06/08/2016  . PCP NOTES >>>>>>>>>>>>>>>>>>>> 04/29/2015  . Binocular vision disorder with diplopia 10/01/2014  . 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 Herpes zoster-- 09-2014 09/05/2014  . Anal fissure 02/28/2014  . Chronic prostatitis 02/28/2014  . Abnormal prostate specific antigen 02/28/2014  . Gastroesophageal reflux disease without esophagitis 02/28/2014  . Ganglion of tendon 02/28/2014  . Incomplete bladder emptying 02/28/2014  . FOM (frequency of micturition) 02/28/2014  . Nonexudative age-related macular degeneration 02/12/2014  . 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 Annual physical exam 10/21/2011  . Neck -- back pain- UDS 09/18/2011  . Other allergic rhinitis 12/19/2009  . PELVIC PAIN, CHRONIC 04/03/2009  . Hypogonadism -- per urology 08/11/2007  . OSTEOPOROSIS, IDIOPATHIC 08/11/2007  . HEADACHE 08/11/2007   Past Medical History:  Diagnosis Date  . ALLERGIC RHINITIS 12/19/2009  . Allergy   . Back pain chronic    On hydrocodone-Neurontin  . Blepharitis    B, chronic  . BPH (benign prostatic hyperplasia)   . Chronic prostatitis     f/u by urology, has a prostate massage prn  . COVID-19 03/2019  . GERD (gastroesophageal reflux disease)   . Groin injury   . HYPOGONADISM, MALE 08/11/2007   f/u by Dr Thomasene Mohair urology    . OSTEOPOROSIS, IDIOPATHIC 08/11/2007   f/u at [facility] , now improved to osteopenia  . Raynaud's disease /phenomenon 08/16/2012  . Rosacea   . Shingles 09-2014  . Varicose veins     Family History  Problem Relation Age of Onset  . COPD Father         Died, 71  . Hypertension Father   . Stroke Father   . CAD Father   . Allergic rhinitis Father   . Diabetes Mother   . Hypertension Mother   . CAD Mother           . Colon polyps Mother   . Cancer Mother        Ovarian cancer  . Hypertension Sister   . High Cholesterol Sister   . Colon cancer Maternal Aunt   . Prostate cancer Neg Hx   . Esophageal cancer Neg Hx   . Rectal cancer Neg Hx   . Stomach cancer  Neg Hx   . Pancreatic cancer Neg Hx   . Angioedema Neg Hx   . Asthma Neg Hx   . Eczema Neg Hx   . Immunodeficiency Neg Hx   . Urticaria Neg Hx     Past Surgical History:  Procedure Laterality Date  . BLEPHAROPLASTY     B blepharoplasty @ [facility] 08-2011  . CERVICAL DISCECTOMY  1998  . CERVICAL FUSION  1994   C4-C5-C6 fusion  . CHEILECTOMY Left 09/26/2019   Procedure: CHEILECTOMY LEFT GREAT TOE;  Surgeon: Newt Minion, MD;  Location: [place];  Service: Orthopedics;  Laterality: Left;  . COLONOSCOPY    . EYE SURGERY     eyelid surgery on both eyes  . HERNIA REPAIR Left 1979  . IR GENERIC HISTORICAL  03/17/2016   IR EMBO VENOUS NOT HEMORR HEMANG  INC GUIDE ROADMAPPING 03/17/2016 Greggory Keen, MD [facility] ULTRASOUND  . KNEE ARTHROSCOPY    . POLYPECTOMY    . SHOULDER SURGERY Right    early 2000s  . SIGMOIDOSCOPY    . TRANSURETHRAL RESECTION OF PROSTATE  07-2009   Social History   Occupational History  . Occupation: [employer]   Tobacco Use  . Smoking status: Never Smoker  . Smokeless tobacco: Never Used  Vaping Use  . Vaping Use: Never used  Substance and Sexual Activity  . Alcohol use: No    Alcohol/week: 0.0 standard drinks  . Drug use: No  . Sexual activity: Yes

## 2019-12-26 ENCOUNTER — Telehealth: Payer: Self-pay | Admitting: Family Medicine

## 2019-12-26 NOTE — Telephone Encounter (Signed)
Called patient left message to return call to schedule an appointment with Dr Junius Roads for foot injection

## 2019-12-28 DIAGNOSIS — M9902 Segmental and somatic dysfunction of thoracic region: Secondary | ICD-10-CM | POA: Diagnosis not present

## 2019-12-28 DIAGNOSIS — M9901 Segmental and somatic dysfunction of cervical region: Secondary | ICD-10-CM | POA: Diagnosis not present

## 2019-12-28 DIAGNOSIS — M9903 Segmental and somatic dysfunction of lumbar region: Secondary | ICD-10-CM | POA: Diagnosis not present

## 2019-12-28 DIAGNOSIS — M5431 Sciatica, right side: Secondary | ICD-10-CM | POA: Diagnosis not present

## 2020-01-01 ENCOUNTER — Ambulatory Visit: Payer: Self-pay

## 2020-01-01 ENCOUNTER — Encounter: Payer: Self-pay | Admitting: Family Medicine

## 2020-01-01 ENCOUNTER — Other Ambulatory Visit: Payer: Self-pay

## 2020-01-01 ENCOUNTER — Ambulatory Visit (INDEPENDENT_AMBULATORY_CARE_PROVIDER_SITE_OTHER): Payer: PPO | Admitting: Family Medicine

## 2020-01-01 DIAGNOSIS — M7672 Peroneal tendinitis, left leg: Secondary | ICD-10-CM | POA: Diagnosis not present

## 2020-01-01 NOTE — Progress Notes (Signed)
Subjective: He is here for ultrasound-guided left ankle peroneal tendon injection.  Doing better after recent plantar fascia injection, no longer having plantar heel pain.  Pain is on the posterior lateral aspect of the ankle.  Objective: He is tender to palpation just behind the lateral malleolus, there is crepitus with active circumduction of the ankle.  No significant pain with eversion against resistance.  Procedure: Ultrasound-guided left peroneal tendon injection: After sterile prep with Betadine, injected 4 cc 1% lidocaine without epinephrine and 40 mg methylprednisolone into the peroneal tendon sheath without complication.  Good immediate relief.

## 2020-01-12 DIAGNOSIS — N411 Chronic prostatitis: Secondary | ICD-10-CM | POA: Diagnosis not present

## 2020-01-12 DIAGNOSIS — E291 Testicular hypofunction: Secondary | ICD-10-CM | POA: Diagnosis not present

## 2020-01-16 MED FILL — GABAPENTIN 300 MG CAPSULE: 300 | 90 days supply | Qty: 90 | Fill #1

## 2020-01-16 MED FILL — OMEPRAZOLE 40 MG CPDR: 40 | 30 days supply | Qty: 60 | Fill #3

## 2020-02-24 ENCOUNTER — Encounter: Payer: Self-pay | Admitting: Emergency Medicine

## 2020-02-24 ENCOUNTER — Other Ambulatory Visit: Payer: Self-pay

## 2020-02-24 ENCOUNTER — Emergency Department (INDEPENDENT_AMBULATORY_CARE_PROVIDER_SITE_OTHER)
Admission: EM | Admit: 2020-02-24 | Discharge: 2020-02-24 | Disposition: A | Discharge status: Home / Self Care | Payer: PPO | Source: Home / Self Care

## 2020-02-24 DIAGNOSIS — J069 Acute upper respiratory infection, unspecified: Secondary | ICD-10-CM | POA: Diagnosis not present

## 2020-02-24 NOTE — ED Triage Notes (Signed)
Patient c/o non-productive cough, sinus drainage, runny nose, bilateral ear pain, afebrile, body chills, taken Tylenol.  Patient has not been vaccinated.

## 2020-02-24 NOTE — Discharge Instructions (Signed)
  You may take 500mg  acetaminophen every 4-6 hours or in combination with ibuprofen 400-600mg  every 6-8 hours as needed for pain, inflammation, and fever.  Be sure to well hydrated with clear liquids and get at least 8 hours of sleep at night, preferably more while sick.   Please follow up with family medicine in 1 week if needed.  Due to concern for possibly having Covid-19, it is advised that you self-isolate at home until test results come back, usually 2-3 days.  If positive, it is recommended you stay isolated for at least 10 days after symptom onset and 24 hours after last fever without taking medication (whichever is longer).  If you MUST go out, please wear a mask at all times, limit contact with others.   If your test is negative, you still have plenty of time to get the Covid vaccine. It is recommended you schedule an appointment to get your vaccine once you get over this current illness.  Please ask your primary care provider about any questions/concerns related to the vaccine.

## 2020-02-24 NOTE — ED Provider Notes (Signed)
[name]    CSN: [id number] Arrival date & time: 02/24/20  1028      History   Chief Complaint Chief Complaint  Patient presents with  . URI    HPI Erik [name] is a 64 y.o. male.   HPI  Erik [name] is a 64 y.o. male presenting to UC with c/o nonproductive cough, sinus drainage, bilateral ear pain, and chills for 2-3 days.  He has taken Tylenol. He has not received Covid-19 vaccine. No known sick contacts or recent travel. Denies n/v/d. No chest pain or SOB.   Past Medical History:  Diagnosis Date  . ALLERGIC RHINITIS 12/19/2009  . Allergy   . Back pain chronic    On hydrocodone-Neurontin  . Blepharitis    B, chronic  . BPH (benign prostatic hyperplasia)   . Chronic prostatitis     f/u by urology, has a prostate massage prn  . COVID-19 03/2019  . GERD (gastroesophageal reflux disease)   . Groin injury   . HYPOGONADISM, MALE 08/11/2007   f/u by Dr Thomasene Mohair urology    . OSTEOPOROSIS, IDIOPATHIC 08/11/2007   f/u at [facility] , now improved to osteopenia  . Raynaud's disease /phenomenon 08/16/2012  . Rosacea   . Shingles 09-2014  . Varicose veins     Patient Active Problem List   Diagnosis Date Noted  . Hallux rigidus, left foot   . Trigger thumb of left hand 09/13/2019  . Arthritis of left acromioclavicular joint 09/13/2019  . Arthritis of carpometacarpal [facility]) joint of left thumb 12/06/2018  . Early stage glaucoma 07/26/2018  . Enlarged prostate 07/26/2018  . Raynaud's disease without gangrene 10/08/2017  . 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 Lumbar radiculopathy 03/25/2017  . Degenerative cervical disc 06/08/2016  . Degenerative lumbar disc 06/08/2016  . Nonallopathic lesion of lumbosacral region 06/08/2016  . Right leg pain 08/07/2015  . PCP NOTES >>>>>>>>>>>>>>>>>>>> 04/29/2015  . Rib pain on right side 11/08/2014  . Binocular vision disorder with diplopia 10/01/2014  . 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 Herpes zoster-- 09-2014 09/05/2014  . Anal fissure 02/28/2014  . Chronic prostatitis 02/28/2014  . Abnormal prostate specific antigen 02/28/2014  . Gastroesophageal reflux disease without esophagitis 02/28/2014  . Ganglion of tendon 02/28/2014  . Incomplete bladder emptying 02/28/2014  . FOM (frequency of micturition) 02/28/2014  . Osteopenia 02/28/2014  . Nonexudative age-related macular degeneration 02/12/2014  . 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 Annual physical exam 10/21/2011  . Neck -- back pain- UDS 09/18/2011  . Other allergic rhinitis 12/19/2009  . PELVIC PAIN, CHRONIC 04/03/2009  . Hypogonadism -- per urology 08/11/2007  . OSTEOPOROSIS, IDIOPATHIC 08/11/2007  . HEADACHE 08/11/2007    Past Surgical History:  Procedure Laterality Date  . BLEPHAROPLASTY     B blepharoplasty @ [facility] 08-2011  . CERVICAL DISCECTOMY  1998  . CERVICAL FUSION  1994   C4-C5-C6 fusion  . CHEILECTOMY Left 09/26/2019   Procedure: CHEILECTOMY LEFT GREAT TOE;  Surgeon: Newt Minion, MD;  Location: [place];  Service: Orthopedics;  Laterality: Left;  . COLONOSCOPY    . EYE SURGERY     eyelid surgery on both eyes  . HERNIA REPAIR Left 1979  . IR GENERIC HISTORICAL  03/17/2016   IR EMBO VENOUS NOT HEMORR HEMANG  INC GUIDE ROADMAPPING 03/17/2016 Greggory Keen, MD [facility] ULTRASOUND  . KNEE ARTHROSCOPY    . POLYPECTOMY    .  SHOULDER SURGERY Right    early 2000s  . SIGMOIDOSCOPY    . TRANSURETHRAL RESECTION OF PROSTATE  07-2009       Home Medications    Prior to Admission medications   Medication Sig Start Date End Date Taking? Authorizing Provider  amLODipine (NORVASC) 2.5 MG tablet Take 1 tablet (2.5 mg total) by mouth daily. 11/06/19   Ofilia Neas, PA-C  B-D INTEGRA SYRINGE 23G X 1" 3 ML MISC  12/15/18   [provider]  gabapentin (NEURONTIN) 300 MG capsule TAKE 1 CAPSULE BY MOUTH AT BEDTIME 09/08/19   Lyndal Pulley, DO  HYDROcodone-acetaminophen (NORCO/VICODIN) 5-325 MG tablet Take 1 tablet by mouth every 6 (six) hours as needed. 11/09/19   Gregor Hams, MD  hyoscyamine (LEVSIN SL) 0.125 MG SL tablet Place 1 tablet (0.125 mg total) under the tongue every 4 (four) hours as needed. Take 1 tab every 4 to 6 hours, under the tongue as needed for abdominal spasms 09/14/19   Levin Erp, PA  lidocaine [facility]) 3 % CREA cream Apply 1 application topically as needed. 07/14/19   [provider]  meloxicam (MOBIC) 7.5 MG tablet Take 1 tablet (7.5 mg total) by mouth daily. 10/18/19   Persons, Bevely Palmer, PA  Multiple Vitamin (MULTI-VITAMINS) TABS Take by mouth.    [provider]  Omega-3 Fatty Acids (FISH OIL) 1000 MG CAPS Take 1 capsule by mouth 2 (two) times daily.    [provider]  omeprazole (PRILOSEC) 40 MG capsule Use 1 capsule once or twice a day for heartburn and reflux 09/08/19   Levin Erp, PA  OVER THE COUNTER MEDICATION Take 1 tablet by mouth 2 (two) times daily. AREDS 2    [provider]  predniSONE (DELTASONE) 50 MG tablet Take 1 tablet (50 mg total) by mouth daily. 11/09/19   Gregor Hams, MD  pregabalin (LYRICA) 75 MG capsule Take 1 capsule (75 mg total) by mouth 2 (two) times daily as needed. 11/09/19   Gregor Hams, MD  sildenafil (REVATIO) 20 MG tablet TAKE 1 TABLET (20 MG TOTAL) BY MOUTH AS NEEDED FOR UP TO 30 DAYS. 06/27/18   [provider]  tadalafil (CIALIS) 5 MG tablet Take 5 mg by mouth daily.    [provider]  testosterone cypionate (DEPOTESTOTERONE CYPIONATE) 100 MG/ML injection Inject into the muscle every 14 (fourteen) days. For IM use only    [provider]    Family History Family History  Problem Relation Age of Onset  . COPD Father        Died, 71  . Hypertension Father   . Stroke Father   . CAD Father   . Allergic rhinitis Father   . Diabetes Mother   . Hypertension Mother   . CAD  Mother           . Colon polyps Mother   . Cancer Mother        Ovarian cancer  . Hypertension Sister   . High Cholesterol Sister   . Colon cancer Maternal Aunt   . Prostate cancer Neg Hx   . Esophageal cancer Neg Hx   . Rectal cancer Neg Hx   . Stomach cancer Neg Hx   . Pancreatic cancer Neg Hx   . Angioedema Neg Hx   . Asthma Neg Hx   . Eczema Neg Hx   . Immunodeficiency Neg Hx   . Urticaria Neg Hx     Social  History Social History   Tobacco Use  . Smoking status: Never Smoker  . Smokeless tobacco: Never Used  Vaping Use  . Vaping Use: Never used  Substance Use Topics  . Alcohol use: No    Alcohol/week: 0.0 standard drinks  . Drug use: No     Allergies   Sulfa drugs cross reactors   Review of Systems Review of Systems  Constitutional: Positive for chills. Negative for fever.  HENT: Positive for congestion, ear pain, postnasal drip, rhinorrhea and sinus pressure. Negative for sore throat, trouble swallowing and voice change.   Respiratory: Positive for cough. Negative for shortness of breath.   Cardiovascular: Negative for chest pain and palpitations.  Gastrointestinal: Negative for abdominal pain, diarrhea, nausea and vomiting.  Musculoskeletal: Negative for arthralgias, back pain and myalgias.  Skin: Negative for rash.  All other systems reviewed and are negative.    Physical Exam Triage Vital Signs ED Triage Vitals [02/24/20 1214]  Enc Vitals Group     BP 119/79     Pulse Rate 79     Resp      Temp 99.6 F (37.6 C)     Temp Source Oral     SpO2 96 %     Weight      Height      Head Circumference      Peak Flow      Pain Score 0     Pain Loc      Pain Edu?      Excl. in [place]?    No data found.  Updated Vital Signs BP 119/79 (BP Location: Right Arm)   Pulse 79   Temp 99.6 F (37.6 C) (Oral)   SpO2 96%   Visual Acuity Right Eye Distance:   Left Eye Distance:   Bilateral Distance:    Right Eye Near:   Left Eye Near:    Bilateral  Near:     Physical Exam Vitals and nursing note reviewed.  Constitutional:      General: He is not in acute distress.    Appearance: Normal appearance. He is well-developed. He is not ill-appearing, toxic-appearing or diaphoretic.  HENT:     Head: Normocephalic and atraumatic.     Right Ear: Tympanic membrane and ear canal normal.     Left Ear: Tympanic membrane and ear canal normal.     Nose: Nose normal.     Right Sinus: No maxillary sinus tenderness or frontal sinus tenderness.     Left Sinus: No maxillary sinus tenderness or frontal sinus tenderness.     Mouth/Throat:     Lips: Pink.     Mouth: Mucous membranes are moist.     Pharynx: Oropharynx is clear. Uvula midline.  Cardiovascular:     Rate and Rhythm: Normal rate and regular rhythm.  Pulmonary:     Effort: Pulmonary effort is normal. No respiratory distress.     Breath sounds: Normal breath sounds. No stridor. No wheezing, rhonchi or rales.  Musculoskeletal:        General: Normal range of motion.     Cervical back: Normal range of motion.  Skin:    General: Skin is warm and dry.  Neurological:     Mental Status: He is alert and oriented to person, place, and time.  Psychiatric:        Behavior: Behavior normal.      UC Treatments / Results  Labs (all labs ordered are listed, but only abnormal results are displayed) Labs  Reviewed  NOVEL CORONAVIRUS, NAA   Narrative:    Performed at:  [address] [place], [place]  [id number] Lab Director: Rush Farmer MD, Phone:  [phone]  SARS-COV-2, NAA 2 DAY TAT   Narrative:    Performed at:  [address], [place], [place]  [id number] Lab Director: Rush Farmer MD, Phone:  [phone]    EKG   Radiology No results found.  Procedures Procedures (including critical care time)  Medications Ordered in UC Medications - No data to display  Initial Impression / Assessment and Plan / UC Course  I have  reviewed the triage vital signs and the nursing notes.  Pertinent labs & imaging results that were available during my care of the patient were reviewed by me and considered in my medical decision making (see chart for details).     No evidence of bacterial infection Encouraged symptomatic tx AVS given  Final Clinical Impressions(s) / UC Diagnoses   Final diagnoses:  Acute upper respiratory infection     Discharge Instructions      You may take 500mg  acetaminophen every 4-6 hours or in combination with ibuprofen 400-600mg  every 6-8 hours as needed for pain, inflammation, and fever.  Be sure to well hydrated with clear liquids and get at least 8 hours of sleep at night, preferably more while sick.   Please follow up with family medicine in 1 week if needed.  Due to concern for possibly having Covid-19, it is advised that you self-isolate at home until test results come back, usually 2-3 days.  If positive, it is recommended you stay isolated for at least 10 days after symptom onset and 24 hours after last fever without taking medication (whichever is longer).  If you MUST go out, please wear a mask at all times, limit contact with others.   If your test is negative, you still have plenty of time to get the Covid vaccine. It is recommended you schedule an appointment to get your vaccine once you get over this current illness.  Please ask your primary care provider about any questions/concerns related to the vaccine.      ED Prescriptions    None     PDMP not reviewed this encounter.   Noe Gens, [place] 02/26/20 [phone]

## 2020-02-26 DIAGNOSIS — N411 Chronic prostatitis: Secondary | ICD-10-CM | POA: Diagnosis not present

## 2020-02-26 LAB — NOVEL CORONAVIRUS, NAA: SARS-CoV-2, NAA: NOT DETECTED

## 2020-02-26 LAB — SARS-COV-2, NAA 2 DAY TAT

## 2020-02-26 MED FILL — TESTOSTERONE CYP 200 MG/ML: 200 | 84 days supply | Qty: 6 | Fill #0

## 2020-02-26 MED FILL — LUER LOCK SAFETY SYRINGES 2: 25G X 1" | 84 days supply | Qty: 6 | Fill #0

## 2020-03-15 MED FILL — OMEPRAZOLE 40 MG CPDR: 40 | 30 days supply | Qty: 60 | Fill #4

## 2020-03-27 ENCOUNTER — Encounter: Payer: Self-pay | Admitting: Family Medicine

## 2020-03-27 ENCOUNTER — Ambulatory Visit (INDEPENDENT_AMBULATORY_CARE_PROVIDER_SITE_OTHER): Payer: PPO

## 2020-03-27 ENCOUNTER — Ambulatory Visit: Payer: PPO | Admitting: Family Medicine

## 2020-03-27 ENCOUNTER — Ambulatory Visit: Payer: Self-pay

## 2020-03-27 ENCOUNTER — Other Ambulatory Visit: Payer: Self-pay

## 2020-03-27 VITALS — BP 120/78 | HR 75 | Ht 70.75 in | Wt 210.0 lb

## 2020-03-27 DIAGNOSIS — G8929 Other chronic pain: Secondary | ICD-10-CM | POA: Diagnosis not present

## 2020-03-27 DIAGNOSIS — S9032XA Contusion of left foot, initial encounter: Secondary | ICD-10-CM | POA: Diagnosis not present

## 2020-03-27 DIAGNOSIS — M19012 Primary osteoarthritis, left shoulder: Secondary | ICD-10-CM

## 2020-03-27 DIAGNOSIS — M79675 Pain in left toe(s): Secondary | ICD-10-CM

## 2020-03-27 DIAGNOSIS — M25512 Pain in left shoulder: Secondary | ICD-10-CM

## 2020-03-27 NOTE — Patient Instructions (Signed)
Thank you for coming in today.  Please get an Xray today before you leave  Let me know how you feel.  Try PT and home exercises.   If not better let me know and I will order MRI shoulder.   Buddy tape the toes.   Call or go to the ER if you develop a large red swollen joint with extreme pain or oozing puss.

## 2020-03-27 NOTE — Progress Notes (Signed)
Rito Ehrlich, am serving as a Education administrator for Dr. [name].  Erik [name] is a 64 y.o. male who presents to [place] at [facility] today for L shoulder and arm pain.  He was last seen by Dr. Georgina Snell on 11/09/19 for his LBP and B LE pain and before that by Dr. Tamala Julian on 10/18/19 for his L shoulder.  He had a prior L ACJ injection on 09/13/19 w/ no relief noted in his symptoms.  He was also referred to PT and completed 2 visits.  Since his last visit w/ Dr. Georgina Snell, pt reports that his L forearm and L shoulder blade is painful for a few months. States not aware of any injury, but no OTC medication will touch the pain and it keeps him up at night. Patient also states that he would like you to look at his L foot 4th toe as he hit it on the door this morning and it is painful and bruised.   Diagnostic imaging: L shoulder XR- 09/13/19  Pertinent review of systems: No fevers or chills  Relevant historical information: Raynaud disease   Exam:  BP 120/78 (BP Location: Left Arm, Patient Position: Sitting, Cuff Size: Normal)   Pulse 75   Ht 5' 10.75" (1.797 m)   Wt 210 lb (95.3 kg)   SpO2 97%   BMI 29.50 kg/m  General: Well Developed, well nourished, and in no acute distress.   MSK: Left shoulder normal-appearing nontender. Normal motion pain with abduction. Intact strength abduction internal rotation. 4/5 strength external rotation with some pain. Positive Hawkins and Neer's test. Negative Yergason's and speeds test.  Left fourth toe bruised swollen and tender at the DIP joint. Mildly tender to palpation MTP. No deformity noted. Cap refill and sensation are intact distally.    Lab and Radiology Results  Procedure: Real-time Ultrasound Guided Injection of left shoulder subacromial bursa Device: Philips Affiniti 50G Images permanently stored and available for review in PACS Increased thickness subacromial bursa present on ultrasound examination prior to  injection Verbal informed consent obtained.  Discussed risks and benefits of procedure. Warned about infection bleeding damage to structures skin hypopigmentation and fat atrophy among others. Patient expresses understanding and agreement Time-out conducted.   Noted no overlying erythema, induration, or other signs of local infection.   Skin prepped in a sterile fashion.   Local anesthesia: Topical Ethyl chloride.   With sterile technique and under real time ultrasound guidance:  40 mg of Kenalog and 2 mL of Marcaine injected into subacromial bursa. Fluid seen entering the bursa.   Completed without difficulty   Pain partially resolved suggesting accurate placement of the medication.   Advised to call if fevers/chills, erythema, induration, drainage, or persistent bleeding.   Images permanently stored and available for review in the ultrasound unit.  Impression: Technically successful ultrasound guided injection.    X-ray images left foot obtained today personally and independently reviewed Left fourth toe no visible fracture per my interpretation. Await formal radiology review     Assessment and Plan: 64 y.o. male with left shoulder pain chronic with recurrent exacerbation ongoing for months now. Discussed options. Plan for trial of subacromial injection was has not been tried yet. Additionally will proceed with home exercise program. Ideally would do PT patient will let me know if he would like to proceed with it. If not improved reasonable to proceed with MRI as the situation has been ongoing now for about 6 months plus with significant trials  of PT and home exercise in the past.  Toe injury: Consistent with contusion at this point. Radiology overread still pending. Recommend buddy tape and watchful waiting.   PDMP not reviewed this encounter. Orders Placed This Encounter  Procedures  . DG Foot Complete Left    Standing Status:   Future    Number of Occurrences:   1    Standing  Expiration Date:   03/27/2021    Order Specific Question:   Reason for Exam (SYMPTOM  OR DIAGNOSIS REQUIRED)    Answer:   eval 4th toe fx    Order Specific Question:   Preferred imaging location?    Answer:   [facility]    Order Specific Question:   Radiology Contrast Protocol - do NOT remove file path    Answer:   [email or web address]  . [place] LIMITED JOINT SPACE STRUCTURES UP LEFT(NO LINKED CHARGES)    Order Specific Question:   Reason for Exam (SYMPTOM  OR DIAGNOSIS REQUIRED)    Answer:   eval left shoulder    Order Specific Question:   Preferred imaging location?    Answer:   [place]   No orders of the defined types were placed in this encounter.    Discussed warning signs or symptoms. Please see discharge instructions. Patient expresses understanding.   The above documentation has been reviewed and is accurate and complete [name], M.D.

## 2020-03-28 DIAGNOSIS — E291 Testicular hypofunction: Secondary | ICD-10-CM | POA: Diagnosis not present

## 2020-03-29 NOTE — Progress Notes (Signed)
Foot x-ray shows arthritis at the big toe otherwise looks pretty normal.

## 2020-04-02 DIAGNOSIS — N411 Chronic prostatitis: Secondary | ICD-10-CM | POA: Diagnosis not present

## 2020-04-09 DIAGNOSIS — H353133 Nonexudative age-related macular degeneration, bilateral, advanced atrophic without subfoveal involvement: Secondary | ICD-10-CM | POA: Diagnosis not present

## 2020-04-09 DIAGNOSIS — H2513 Age-related nuclear cataract, bilateral: Secondary | ICD-10-CM | POA: Diagnosis not present

## 2020-05-06 ENCOUNTER — Other Ambulatory Visit: Payer: Self-pay | Admitting: Family Medicine

## 2020-05-06 MED FILL — GABAPENTIN 300 MG CAPSULE: 300 | 90 days supply | Qty: 90 | Fill #0

## 2020-05-06 MED FILL — OMEPRAZOLE 40 MG CPDR: 40 | 30 days supply | Qty: 60 | Fill #5

## 2020-05-15 DIAGNOSIS — N411 Chronic prostatitis: Secondary | ICD-10-CM | POA: Diagnosis not present

## 2020-06-06 NOTE — Progress Notes (Signed)
I, [name], LAT, ATC, am serving as scribe for Dr. [name].  GLEB MCGUIRE is a 64 y.o. male who presents to [place] at [facility] today for f/u of L shoulder and L thumb pain.  He was last seen by Dr. Georgina Snell on 03/27/20 for his L shoulder and L foot/4th toe and had a L subacromial injection.  He was offered the option of PT but declined.  Since his last visit, pt reports that his L shoulder remains unchanged despite the L subacromial injection and recent chiropractic care.  His L thumb has been flared up since September.  He's had multiple injections in his L thumb first [facility] w/ Dr. Tamala Julian.  Diagnostic testing: L shoulder XR- 09/13/19  Pertinent review of systems: No fevers or chills  Relevant historical information: History of Raynaud disease.  History osteoporosis.  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   Exam:  BP 112/72 (BP Location: Right Arm, Patient Position: Sitting, Cuff Size: Large)   Pulse 75   Ht 5' 10.75" (1.797 m)   Wt 212 lb (96.2 kg)   SpO2 98%   BMI 29.78 kg/m  General: Well Developed, well nourished, and in no acute distress.   MSK: Left shoulder normal-appearing normal motion  Left thumb tender and swollen base of the thumb. Normal motion.  Strength intact.  Pulses cap refill and sensation intact distally.    Lab and Radiology Results  Procedure: Real-time Ultrasound Guided Injection of left first [facility] Device: Philips Affiniti 50G Images permanently stored and available for review in PACS Verbal informed consent obtained.  Discussed risks and benefits of procedure. Warned about infection bleeding damage to structures skin hypopigmentation and fat atrophy among others. Patient expresses understanding and agreement Time-out conducted.   Noted no overlying erythema, induration, or other signs of local infection.   Skin prepped in a sterile fashion.   Local anesthesia: Topical Ethyl chloride.   With sterile technique and under real time ultrasound  guidance:  20 mg of Kenalog and 0.5 mL of Marcaine injected into the first [place]. Fluid seen entering the joint capsule.   Completed without difficulty   Pain immediately resolved suggesting accurate placement of the medication.   Advised to call if fevers/chills, erythema, induration, drainage, or persistent bleeding.   Images permanently stored and available for review in the ultrasound unit.  Impression: Technically successful ultrasound guided injection.    EXAM: LEFT SHOULDER - 2+ VIEW  COMPARISON:  None.  FINDINGS: There is no evidence of fracture or dislocation. There is no evidence of arthropathy or other focal bone abnormality. Soft tissues are unremarkable.  IMPRESSION: No fracture or dislocation of the left shoulder. Joint spaces are preserved.   Electronically Signed   By: Eddie Candle M.D.   On: 09/14/2019 08:17 I, [name], personally (independently) visualized and performed the interpretation of the images attached in this note.      Assessment and Plan: 64 y.o. male with left shoulder pain ongoing for greater than 6 months.  Patient has had good trial of conservative management with physician directed exercise program, and chiropractor care as well as home exercise program.  He has had several injections and not improved.  At this point plan for MRI to further characterize cause of pain and for potential surgical or injection planning.  Recheck after MRI.  Left thumb pain thought to be due to first [place] DJD.  Patient had previous injections most recently October 2020.  Plan for repeat steroid injection today  and recheck back as needed.  Also recommend Voltaren gel.   PDMP not reviewed this encounter. Orders Placed This Encounter  Procedures  . [place] LIMITED JOINT SPACE STRUCTURES UP LEFT(NO LINKED CHARGES)    Order Specific Question:   Reason for Exam (SYMPTOM  OR DIAGNOSIS REQUIRED)    Answer:   L thumb pain    Order Specific Question:   Preferred  imaging location?    Answer:   [place]  . MR SHOULDER LEFT WO CONTRAST    Standing Status:   Future    Standing Expiration Date:   06/07/2021    Order Specific Question:   What is the patient's sedation requirement?    Answer:   No Sedation    Order Specific Question:   Does the patient have a pacemaker or implanted devices?    Answer:   No    Order Specific Question:   Preferred imaging location?    Answer:   Product/process development scientist (table limit-350lbs)   No orders of the defined types were placed in this encounter.    Discussed warning signs or symptoms. Please see discharge instructions. Patient expresses understanding.   The above documentation has been reviewed and is accurate and complete [name], M.D.

## 2020-06-07 ENCOUNTER — Ambulatory Visit: Payer: Self-pay

## 2020-06-07 ENCOUNTER — Ambulatory Visit: Payer: PPO | Admitting: Family Medicine

## 2020-06-07 ENCOUNTER — Other Ambulatory Visit: Payer: Self-pay

## 2020-06-07 ENCOUNTER — Encounter: Payer: Self-pay | Admitting: Family Medicine

## 2020-06-07 VITALS — BP 112/72 | HR 75 | Ht 70.75 in | Wt 212.0 lb

## 2020-06-07 DIAGNOSIS — G8929 Other chronic pain: Secondary | ICD-10-CM | POA: Diagnosis not present

## 2020-06-07 DIAGNOSIS — M25512 Pain in left shoulder: Secondary | ICD-10-CM | POA: Diagnosis not present

## 2020-06-07 DIAGNOSIS — M79645 Pain in left finger(s): Secondary | ICD-10-CM

## 2020-06-07 DIAGNOSIS — M1812 Unilateral primary osteoarthritis of first carpometacarpal joint, left hand: Secondary | ICD-10-CM

## 2020-06-07 NOTE — Patient Instructions (Signed)
Thank you for coming in today.  You should hear from MRI scheduling within 1 week. If you do not hear please let me know.   Call or go to the ER if you develop a large red swollen joint with extreme pain or oozing puss.   Ok to use the voltaren gel on your hand.

## 2020-06-14 ENCOUNTER — Other Ambulatory Visit: Payer: Self-pay | Admitting: Physician Assistant

## 2020-06-14 MED FILL — OMEPRAZOLE 40 MG CPDR: 40 | 30 days supply | Qty: 60 | Fill #0

## 2020-06-15 ENCOUNTER — Other Ambulatory Visit: Payer: Self-pay

## 2020-06-15 ENCOUNTER — Ambulatory Visit (INDEPENDENT_AMBULATORY_CARE_PROVIDER_SITE_OTHER): Payer: PPO

## 2020-06-15 DIAGNOSIS — M25512 Pain in left shoulder: Secondary | ICD-10-CM

## 2020-06-15 DIAGNOSIS — M19012 Primary osteoarthritis, left shoulder: Secondary | ICD-10-CM | POA: Diagnosis not present

## 2020-06-19 MED FILL — TESTOSTERONE CYP 200 MG/ML: 200 | 84 days supply | Qty: 6 | Fill #1

## 2020-06-21 ENCOUNTER — Other Ambulatory Visit (HOSPITAL_BASED_OUTPATIENT_CLINIC_OR_DEPARTMENT_OTHER): Payer: Self-pay | Admitting: Urology

## 2020-06-21 DIAGNOSIS — N411 Chronic prostatitis: Secondary | ICD-10-CM | POA: Diagnosis not present

## 2020-06-21 MED FILL — CIPROFLOXACIN HCL 500 MG TA: 500 | 7 days supply | Qty: 14 | Fill #0

## 2020-07-29 ENCOUNTER — Other Ambulatory Visit: Payer: Self-pay | Admitting: Family Medicine

## 2020-07-29 ENCOUNTER — Encounter: Payer: Self-pay | Admitting: Family Medicine

## 2020-07-29 ENCOUNTER — Other Ambulatory Visit: Payer: Self-pay

## 2020-07-29 ENCOUNTER — Ambulatory Visit (INDEPENDENT_AMBULATORY_CARE_PROVIDER_SITE_OTHER): Payer: PPO

## 2020-07-29 ENCOUNTER — Ambulatory Visit: Payer: PPO | Admitting: Family Medicine

## 2020-07-29 VITALS — BP 120/84 | HR 74 | Ht 70.75 in | Wt 209.4 lb

## 2020-07-29 DIAGNOSIS — M542 Cervicalgia: Secondary | ICD-10-CM | POA: Diagnosis not present

## 2020-07-29 DIAGNOSIS — M4322 Fusion of spine, cervical region: Secondary | ICD-10-CM | POA: Insufficient documentation

## 2020-07-29 HISTORY — DX: Fusion of spine, cervical region: M43.22

## 2020-07-29 MED ORDER — TIZANIDINE HCL 4 MG PO TABS
4.0000 mg | ORAL_TABLET | Freq: Four times a day (QID) | ORAL | 1 refills | Status: DC | PRN
Start: 2020-07-29 — End: 2020-07-29

## 2020-07-29 MED ORDER — TIZANIDINE HCL 4 MG PO TABS: 4.0000 mg | ORAL_TABLET | Freq: Four times a day (QID) | ORAL | 1 refills | Status: DC | PRN

## 2020-07-29 MED FILL — tiZANidine HCL 4 MG TABS: 4 | 8 days supply | Qty: 30 | Fill #0

## 2020-07-29 NOTE — Progress Notes (Signed)
No fracture or broken hardware.  Arthritis is present in the neck.

## 2020-07-29 NOTE — Progress Notes (Signed)
   I, Erik [name], LAT, ATC, am serving as scribe for Dr. Lynne [name].  Erik [name] is a 65 y.o. male who [facility] to [place] [facility] today for neck pain after suffering a fall on the ice on Wed, on 1/19 landing on his back .  He locates his pain to bilat c-spine, R>L . He was last seen by Dr. Georgina Snell on 06/07/20 for his chronic L shoulder pain and L thumb pain.    He has been seeing a chiropractor for his left shoulder which is slowly improving.  Radiating pain: no UE numbness/tingling: no Aggravating factors: any movement, sleeping Treatments tried: Tylenol, IBU  Pertinent review of systems: No fevers or chills.  Minimal headaches.  No fogginess or fussiness.  No confusion or lethargy.  No photophobia or phonophobia.  Relevant historical information: Raynaud disease.  History of cervical fusion 1994.   Exam:  BP 120/84 (BP Location: Right Arm, Patient Position: Sitting, Cuff Size: Normal)   Pulse 74   Ht 5' 10.75" (1.797 m)   Wt 209 lb 6.4 oz (95 kg)   SpO2 97%   BMI 29.41 kg/m  General: Well Developed, well nourished, and in no acute distress.   MSK: C-spine normal-appearing Nontender midline. Tender palpation in the cervical paraspinal musculature. Significantly decreased cervical motion. Upper extremity strength reflexes and sensation are equal and normal throughout bilateral upper extremities.   Neuropsych: Alert and oriented normal speech thought process coordination and gait.     Lab and Radiology Results  X-ray images C-spine obtained today personally and independently interpreted. Intact surgical hardware anterior cervical fusion at C4, C5, C6.  Significant endplate DDD at T0-G2, I9-4 and C7-T1.  No fractures or malalignment. Await formal radiology review    Assessment and Plan: 65 y.o. male with neck pain after fall on ice.  Patient does have a history of cervical fusion and does have degenerative changes in the C-spine.   However I think the main cause of pain today is neck muscle spasm and dysfunction.  Plan to continue chiropractor care that he already is receiving for his shoulder.  If not improving next step for me would be physical therapy.  Consider advanced imaging in the future if needed including MRI or CT myelogram.  Recommend TENS unit heating pad.  Also prescribed tizanidine.   PDMP not reviewed this encounter. Orders Placed This Encounter  Procedures  . DG Cervical Spine 2 or 3 views    Standing Status:   Future    Number of Occurrences:   1    Standing Expiration Date:   07/29/2021    Order Specific Question:   Reason for Exam (SYMPTOM  OR DIAGNOSIS REQUIRED)    Answer:   c-spine pain    Order Specific Question:   Preferred imaging location?    Answer:   [facility]   Meds ordered this encounter  Medications  . tiZANidine (ZANAFLEX) 4 MG tablet    Sig: Take 1 tablet (4 mg total) by mouth every 6 (six) hours as needed for muscle spasms.    Dispense:  30 tablet    Refill:  1     Discussed warning signs or symptoms. Please see discharge instructions. Patient expresses understanding.   The above documentation has been reviewed and is accurate and complete Erik [name], M.D.

## 2020-07-29 NOTE — Addendum Note (Signed)
Addended by: Gregor Hams on: 07/29/2020 10:12 AM   Modules accepted: Orders

## 2020-07-29 NOTE — Patient Instructions (Addendum)
Thank you for coming in today.  Please get an Xray today before you leave today.  Next steps are typically PT.   Ok to continue chiropractor.   Let me know if you want a referral.   If just not getting better I can get advanced imaging.   Keep on the look out for worsening pain or numbness or weakness going down the arm.

## 2020-07-29 NOTE — Progress Notes (Signed)
Noted and provider advised.

## 2020-08-02 ENCOUNTER — Other Ambulatory Visit (HOSPITAL_BASED_OUTPATIENT_CLINIC_OR_DEPARTMENT_OTHER): Payer: Self-pay | Admitting: Internal Medicine

## 2020-08-02 MED FILL — FLUAD QUADRIVALENT 0.5 ML P: 0.5 | 21 days supply | Qty: 1 | Fill #0

## 2020-08-08 ENCOUNTER — Ambulatory Visit: Payer: PPO | Admitting: Family Medicine

## 2020-08-08 ENCOUNTER — Ambulatory Visit: Payer: Self-pay

## 2020-08-08 ENCOUNTER — Other Ambulatory Visit: Payer: Self-pay

## 2020-08-08 ENCOUNTER — Other Ambulatory Visit: Payer: Self-pay | Admitting: Family Medicine

## 2020-08-08 VITALS — BP 122/84 | HR 68 | Ht 70.75 in | Wt 211.6 lb

## 2020-08-08 DIAGNOSIS — M79602 Pain in left arm: Secondary | ICD-10-CM | POA: Diagnosis not present

## 2020-08-08 DIAGNOSIS — M5412 Radiculopathy, cervical region: Secondary | ICD-10-CM | POA: Diagnosis not present

## 2020-08-08 MED ORDER — GABAPENTIN 300 MG PO CAPS: 300.0000 mg | ORAL_CAPSULE | Freq: Three times a day (TID) | ORAL | 3 refills | Status: DC | PRN

## 2020-08-08 MED ORDER — PREDNISONE 50 MG PO TABS: 50.0000 mg | ORAL_TABLET | Freq: Every day | ORAL | 0 refills | Status: DC

## 2020-08-08 MED ORDER — HYDROCODONE-ACETAMINOPHEN 5-325 MG PO TABS: 1.0000 | ORAL_TABLET | Freq: Four times a day (QID) | ORAL | 0 refills | Status: DC | PRN

## 2020-08-08 MED FILL — predniSONE 50 MG TABS: 50 | 5 days supply | Qty: 5 | Fill #0

## 2020-08-08 MED FILL — HYDROCODON-APAP 5-325: 5-325 | 3 days supply | Qty: 15 | Fill #0

## 2020-08-08 MED FILL — GABAPENTIN 300 MG CAPSULE: 300 | 30 days supply | Qty: 270 | Fill #0

## 2020-08-08 NOTE — Patient Instructions (Addendum)
Thank you for coming in today.  MRI Saturday 8 am. Erik [name]. Be there at about 750 am.  Take the prednisone now.   Increase gabapentin to 1 pill during the day in the morning and lunch and 2 at night. Max dose of gabapentin is 3 pills 3x daily.   Use hydrocodone if needed for severe pain. Take with laxitivies.   I will get the MRI results to you ASAP.

## 2020-08-08 NOTE — Progress Notes (Signed)
I, Erik [name], LAT, ATC, am serving as scribe for Dr. Lynne [name].  Erik [name] is a 65 y.o. male who presents to [place] at [facility] today for f/u of neck and L shoulder pain.  He was last seen by Dr. Georgina Snell on 07/29/20 after slipping on the ice and falling on his back and was prescribed tizanidine.  He was advised to con't seeing his chiropractor if he so chooses.  Since his last visit, pt reports he now is having pn L c-spine that radiated into biceps and down into L hand. Pt also locates pain to medial border of L scapular. The pain in his L hand is stabbing and constant. Pt locates hand pain to dorsal aspect and into thumb side.  Diagnostic testing: C-spine XR- 07/29/20; L shoulder MRI- 06/15/20  Pertinent review of systems: No fevers or chills.  Weakness and pain left upper extremity.  Relevant historical information: History cervical fusion C4-5 and C5-6   Exam:  BP 122/84 (BP Location: Right Arm, Patient Position: Sitting, Cuff Size: Normal)   Pulse 68   Ht 5' 10.75" (1.797 m)   Wt 211 lb 9.6 oz (96 kg)   SpO2 98%   BMI 29.72 kg/m  General: Well Developed, well nourished, and in no acute distress.   MSK: C-spine normal-appearing Nontender midline.  Significantly limited cervical motion. Left arm normal-appearing Decrease strength to left biceps 3/5 otherwise strength is intact throughout. Reflexes are intact laterally. Sensation is intact throughout the upper extremity bilaterally. Pulses capillary fill are intact bilateral upper extremities.   Lab and Radiology Results  EXAM: CERVICAL SPINE - 2-3 VIEW  COMPARISON:  06/08/2016  FINDINGS: Seven cervical segments are well visualized. Postsurgical changes at C4-5 and C5-6 with anterior fixation are again noted and stable. No hardware failure is seen. Disc space narrowing is noted at C3-4 with osteophytic change stable from the prior study. Similar findings are noted at C6-7 and C7-T1.  The odontoid is within normal limits. No soft tissue abnormality is seen.  IMPRESSION: Degenerative and postoperative changes similar to that seen on prior exam. No acute abnormality noted.   Electronically Signed   By: Inez Catalina M.D.   On: 07/29/2020 09:44 I, Erik [name], personally (independently) visualized and performed the interpretation of the images attached in this note.    Assessment and Plan: 65 y.o. male with neck pain and left arm pain.  Patient slipped and fell on the ice on January 19.  He was seen in clinic on January 24 where he was experiencing neck pain without much arm pain.  He was receiving chiropractic care and we elected to continue conservative management.  However in the interim he has developed severe neck pain now radiating down his left arm associated with left arm weakness.  He notes his pain is consistent with prior episodes of cervical radiculopathy.  He does have a history of taking gabapentin has been taking 300 mg of gabapentin at bedtime which has not been helpful.  Based on my physical exam today I am concerned that he has developed worsening C6 radiculopathy.  He has weakness and pain consistent with this level.  Plan for stat MRI.  I have already scheduled this for Saturday, February 6.  Will prescribe gabapentin at higher doses and course of prednisone as well as limited hydrocodone.  Likely will proceed with an epidural steroid injection based on MRI results.  Reviewed precautions.  Patient expresses understanding and agreement.   PDMP  reviewed during this encounter. Orders Placed This Encounter  Procedures  . MR CERVICAL SPINE WO CONTRAST    Standing Status:   Future    Standing Expiration Date:   08/08/2021    Order Specific Question:   What is the patient's sedation requirement?    Answer:   No Sedation    Order Specific Question:   Does the patient have a pacemaker or implanted devices?    Answer:   No    Order Specific Question:    Preferred imaging location?    Answer:   [facility] (table limit-350lbs)   Meds ordered this encounter  Medications  . gabapentin (NEURONTIN) 300 MG capsule    Sig: Take 1-3 capsules (300-900 mg total) by mouth 3 (three) times daily as needed.    Dispense:  270 capsule    Refill:  3  . predniSONE (DELTASONE) 50 MG tablet    Sig: Take 1 tablet (50 mg total) by mouth daily.    Dispense:  5 tablet    Refill:  0  . HYDROcodone-acetaminophen (NORCO/VICODIN) 5-325 MG tablet    Sig: Take 1 tablet by mouth every 6 (six) hours as needed.    Dispense:  15 tablet    Refill:  0     Discussed warning signs or symptoms. Please see discharge instructions. Patient expresses understanding.

## 2020-08-10 ENCOUNTER — Ambulatory Visit (INDEPENDENT_AMBULATORY_CARE_PROVIDER_SITE_OTHER): Payer: PPO

## 2020-08-10 ENCOUNTER — Other Ambulatory Visit: Payer: Self-pay

## 2020-08-10 DIAGNOSIS — M4802 Spinal stenosis, cervical region: Secondary | ICD-10-CM | POA: Diagnosis not present

## 2020-08-10 DIAGNOSIS — M4312 Spondylolisthesis, cervical region: Secondary | ICD-10-CM | POA: Diagnosis not present

## 2020-08-10 DIAGNOSIS — M5412 Radiculopathy, cervical region: Secondary | ICD-10-CM

## 2020-08-10 DIAGNOSIS — M79602 Pain in left arm: Secondary | ICD-10-CM

## 2020-08-10 DIAGNOSIS — M2578 Osteophyte, vertebrae: Secondary | ICD-10-CM | POA: Diagnosis not present

## 2020-08-12 ENCOUNTER — Telehealth: Payer: Self-pay | Admitting: Family Medicine

## 2020-08-12 ENCOUNTER — Encounter: Payer: Self-pay | Admitting: Family Medicine

## 2020-08-12 DIAGNOSIS — M5412 Radiculopathy, cervical region: Secondary | ICD-10-CM

## 2020-08-12 DIAGNOSIS — N411 Chronic prostatitis: Secondary | ICD-10-CM | POA: Diagnosis not present

## 2020-08-12 DIAGNOSIS — M79602 Pain in left arm: Secondary | ICD-10-CM

## 2020-08-12 NOTE — Telephone Encounter (Signed)
Epidural steroid injection ordered.  Please call [facility] and [phone] to schedule

## 2020-08-12 NOTE — Telephone Encounter (Signed)
Called and left a VM w/ instructions to call [facility] to schedule his epidural steroid injection.

## 2020-08-12 NOTE — Progress Notes (Signed)
MRI cervical spine shows solid fusion at C4-5 and C5-6. At C3 and C4 you have severe right and moderate left foraminal stenosis.  At C6-7 mild foraminal narrowing  Mild left-sided spurring at C7-T1 without significant stenosis is stable.  The main problem is probably at C6-7 based on your symptoms.  I will go ahead and order an epidural steroid injection.  The severe changes at C3 and C4 are probably not causing your symptoms.

## 2020-08-15 ENCOUNTER — Other Ambulatory Visit: Payer: PPO

## 2020-08-16 ENCOUNTER — Other Ambulatory Visit: Payer: Self-pay

## 2020-08-16 ENCOUNTER — Ambulatory Visit
Admission: RE | Admit: 2020-08-16 | Discharge: 2020-08-16 | Disposition: A | Discharge status: Home / Self Care | Payer: PPO | Source: Ambulatory Visit | Attending: Family Medicine | Admitting: Family Medicine

## 2020-08-16 DIAGNOSIS — M5412 Radiculopathy, cervical region: Secondary | ICD-10-CM | POA: Diagnosis not present

## 2020-08-16 DIAGNOSIS — M79602 Pain in left arm: Secondary | ICD-10-CM

## 2020-08-16 MED ORDER — TRIAMCINOLONE ACETONIDE 40 MG/ML IJ SUSP (RADIOLOGY)
60.0000 mg | Freq: Once | INTRAMUSCULAR | Status: AC
  Administered 2020-08-16: 60 mg via EPIDURAL

## 2020-08-16 MED ORDER — IOPAMIDOL (ISOVUE-M 300) INJECTION 61%
1.0000 mL | Freq: Once | INTRAMUSCULAR | Status: AC | PRN
  Administered 2020-08-16: 1 mL via EPIDURAL

## 2020-08-16 NOTE — Discharge Instructions (Signed)

## 2020-08-23 MED FILL — OMEPRAZOLE 40 MG CPDR: 40 | 30 days supply | Qty: 60 | Fill #1

## 2020-08-26 ENCOUNTER — Encounter: Payer: Self-pay | Admitting: Family Medicine

## 2020-08-26 DIAGNOSIS — M5030 Other cervical disc degeneration, unspecified cervical region: Secondary | ICD-10-CM | POA: Diagnosis not present

## 2020-08-26 DIAGNOSIS — M47812 Spondylosis without myelopathy or radiculopathy, cervical region: Secondary | ICD-10-CM | POA: Diagnosis not present

## 2020-08-26 DIAGNOSIS — M2578 Osteophyte, vertebrae: Secondary | ICD-10-CM | POA: Diagnosis not present

## 2020-08-26 DIAGNOSIS — M5412 Radiculopathy, cervical region: Secondary | ICD-10-CM | POA: Diagnosis not present

## 2020-08-26 DIAGNOSIS — M542 Cervicalgia: Secondary | ICD-10-CM | POA: Diagnosis not present

## 2020-09-09 ENCOUNTER — Other Ambulatory Visit: Payer: Self-pay | Admitting: Internal Medicine

## 2020-09-09 ENCOUNTER — Encounter: Payer: Self-pay | Admitting: Internal Medicine

## 2020-09-09 DIAGNOSIS — E291 Testicular hypofunction: Secondary | ICD-10-CM | POA: Diagnosis not present

## 2020-09-09 DIAGNOSIS — N411 Chronic prostatitis: Secondary | ICD-10-CM | POA: Diagnosis not present

## 2020-09-09 MED ORDER — HYDROCORT-PRAMOXINE (PERIANAL) 1-1 % EX FOAM: 1.0000 | Freq: Two times a day (BID) | CUTANEOUS | 1 refills | Status: DC

## 2020-09-10 ENCOUNTER — Telehealth: Payer: Self-pay

## 2020-09-10 NOTE — Telephone Encounter (Signed)
PA initiated via Covermymeds; KEY: : [id number]. Awaiting determination.

## 2020-09-11 ENCOUNTER — Encounter: Payer: PPO | Admitting: Neurology

## 2020-09-11 ENCOUNTER — Other Ambulatory Visit: Payer: Self-pay

## 2020-09-11 ENCOUNTER — Ambulatory Visit (INDEPENDENT_AMBULATORY_CARE_PROVIDER_SITE_OTHER): Payer: PPO | Admitting: Neurology

## 2020-09-11 DIAGNOSIS — M25512 Pain in left shoulder: Secondary | ICD-10-CM | POA: Diagnosis not present

## 2020-09-11 DIAGNOSIS — G8929 Other chronic pain: Secondary | ICD-10-CM

## 2020-09-11 DIAGNOSIS — Z0289 Encounter for other administrative examinations: Secondary | ICD-10-CM

## 2020-09-11 NOTE — Procedures (Signed)
Full Name: Erik [name] Gender: Male MRN #: [id number] Date of Birth: 08/08/1955    Visit Date: 09/11/2020 07:16 Age: 65 Years Examining Physician: Marcial Pacas, MD  Referring Physician: Erline Levine, MD History: 65 year old male with history of cervical decompression surgery C4-5-6 presented with a year history of gradual onset worsening left-sided neck pain, radiating pain to left posterior neck, shoulder blade region, also left arm.  MRI of cervical spine showed degenerative changes at C3-4 level, with moderate to severe bilateral foraminal narrowing  Summary of the test: Nerve conduction study: Bilateral median sensory response showed slightly decreased snap amplitude.  Bilateral ulnar sensory responses were normal.  Bilateral median/ulnar mixed response showed no significant abnormalities.  Bilateral radial sensory responses were normal.  Bilateral median, ulnar motor responses were normal  Electromyography: Selected needle examination was performed at bilateral upper extremity muscles and cervical paraspinal muscles.  There was evidence of irritation at left cervical paraspinal muscles, increased insertional activity, small amplitude polyphasic motor unit potential.  There was also mixture of normal and complex and large motor unit potential at the left deltoid, brachial radialis with slightly decreased recruitment patterns.   Conclusion: This is a mild abnormal study.  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  There is no evidence of left upper extremity focal neuropathy.    ------------------------------- Marcial Pacas, M.D. PhD  [facility] [address], [place], [place] [address] Tel: [phone] Fax: [phone]  Verbal informed consent was obtained from the patient, patient was informed of  potential risk of procedure, including bruising, bleeding, hematoma formation, infection, muscle weakness, muscle pain, numbness, among others.        [place]    Nerve / Sites Muscle Latency Ref. Amplitude Ref. Rel Amp Segments Distance Velocity Ref. Area    ms ms mV mV %  cm m/s m/s mVms  L Median - APB     Wrist APB 3.1 ?4.4 3.8 ?4.0 100 Wrist - APB 7   16.0     Upper arm APB 7.9  3.6  95.5 Upper arm - Wrist 24 50 ?49 15.3     Ulnar Wrist APB 3.1  4.4  122 Ulnar Wrist - APB    13.5     Ulnar Below Elbow APB 7.6  3.7  84.1 Ulnar Below Elbow - APB    11.1     Ulnar Above Elbow APB 9.6  3.5  94.6 Ulnar Above Elbow - Ulnar Below Elbow    10.8  R Median - APB     Wrist APB 3.3 ?4.4 4.8 ?4.0 100 Wrist - APB 7   20.5     Upper arm APB 8.2  4.0  83.9 Upper arm - Wrist 24 49 ?49 17.5  L Ulnar - ADM     Wrist ADM 2.5 ?3.3 7.8 ?6.0 100 Wrist - ADM 7   33.7     B.Elbow ADM 6.7  6.8  86.9 B.Elbow - Wrist 21 51 ?49 31.1     A.Elbow ADM 8.7  6.7  97.8 A.Elbow - B.Elbow 10 49 ?49 34.0  R Ulnar - ADM     Wrist ADM 2.6 ?3.3 7.6 ?6.0 100 Wrist - ADM 7   30.3     B.Elbow ADM 6.5  7.0  92.4 B.Elbow - Wrist 20 51 ?49 28.9     A.Elbow ADM  8.5  6.5  93.7 A.Elbow - B.Elbow 10 50 ?49 27.4              SNC    Nerve / Sites Rec. Site Peak Lat Ref.  Amp Ref. Segments Distance Peak Diff Ref.    ms ms V V  cm ms ms  L Radial - Anatomical snuff box (Forearm)     Forearm Wrist 2.5 ?2.9 16 ?15 Forearm - Wrist 10    R Radial - Anatomical snuff box (Forearm)     Forearm Wrist 2.5 ?2.9 19 ?15 Forearm - Wrist 10    L Median, Ulnar - Transcarpal comparison     Median Palm Wrist 1.7 ?2.2 27 ?35 Median Palm - Wrist 8       Ulnar Palm Wrist 1.4 ?2.2 5 ?12 Ulnar Palm - Wrist 8          Median Palm - Ulnar Palm  0.3 ?0.4  R Median, Ulnar - Transcarpal comparison     Median Palm Wrist 2.1 ?2.2 44 ?35 Median Palm - Wrist 8       Ulnar Palm Wrist 2.3 ?2.2 17 ?12 Ulnar Palm - Wrist 8          Median Palm - Ulnar Palm   -0.2 ?0.4  L Median - Orthodromic (Dig II, Mid palm)     Dig II Wrist 3.1 ?3.4 8 ?10 Dig II - Wrist 13    R Median - Orthodromic (Dig II, Mid palm)     Dig II Wrist 3.2 ?3.4 7 ?10 Dig II - Wrist 13    L Ulnar - Orthodromic, (Dig V, Mid palm)     Dig V Wrist 3.0 ?3.1 5 ?5 Dig V - Wrist 11    R Ulnar - Orthodromic, (Dig V, Mid palm)     Dig V Wrist 3.1 ?3.1 8 ?5 Dig V - Wrist 23                       F  Wave    Nerve F Lat Ref.   ms ms  L Ulnar - ADM 36.1 ?32.0  R Ulnar - ADM 31.4 ?32.0         EMG Summary Table    Spontaneous MUAP Recruitment  Muscle IA Fib PSW Fasc Other Amp Dur. Poly Pattern  L. First dorsal interosseous Normal None None None _______ Normal Normal Normal Normal  L. Pronator teres Normal None None None _______ Normal Normal Normal Normal  L. Biceps brachii Normal None None None _______ Normal Normal Normal Normal  L. Deltoid Increased None None None _______ Increased Increased 1+ Reduced  L. Triceps brachii Normal None None None _______ Normal Normal Normal Normal  L. Brachioradialis Increased None None None _______ Increased Increased Normal Reduced  R. First dorsal interosseous Normal None None None _______ Normal Normal Normal Normal  R. Pronator teres Normal None None None _______ Normal Normal Normal Normal  R. Biceps brachii Normal None None None _______ Normal Normal Normal Normal  R. Deltoid Normal None None None _______ Normal Normal Normal Normal  R. Triceps brachii Normal None None None _______ Normal Normal Normal Normal  R. Brachioradialis Normal None None None _______ Normal Normal Normal Normal  L. Cervical paraspinals Increased None None None _______ Normal Normal Normal Normal  R. Cervical paraspinals Normal None None None _______ Normal Normal Normal Normal

## 2020-09-12 ENCOUNTER — Other Ambulatory Visit: Payer: Self-pay | Admitting: Internal Medicine

## 2020-09-12 MED ORDER — LIDOCAINE-HYDROCORTISONE ACE 1-1 % EX CREA: 1.0000 "application " | TOPICAL_CREAM | Freq: Two times a day (BID) | CUTANEOUS | 0 refills | Status: DC | PRN

## 2020-09-12 NOTE — Telephone Encounter (Signed)
PA denied. Medication not covered by insurance. Preferred alternative is hydrocortisone Acepramoxine external cream 1-1%

## 2020-09-12 NOTE — Telephone Encounter (Signed)
Spoke w/ Lennette Bihari- [employer]- he informed they received a call from Pt to start appeal. Informed that I had actually sent a mychart message to the Pt earlier today to see if he paid OOP for the foam. Informed Lennette Bihari that Pt is able to use cream- Rx for cream sent to pharmacy.

## 2020-09-12 NOTE — Telephone Encounter (Signed)
PA denied. Awaiting denial information.  °

## 2020-09-12 NOTE — Telephone Encounter (Signed)
Advise patient of his insurance decision, okay to send a prescription for  the cream they approved, twice daily as needed

## 2020-09-13 ENCOUNTER — Encounter: Payer: Self-pay | Admitting: Internal Medicine

## 2020-09-17 ENCOUNTER — Encounter: Payer: Self-pay | Admitting: Family Medicine

## 2020-09-18 ENCOUNTER — Other Ambulatory Visit: Payer: Self-pay | Admitting: Family Medicine

## 2020-09-18 MED ORDER — TIZANIDINE HCL 4 MG PO TABS: 4.0000 mg | ORAL_TABLET | Freq: Four times a day (QID) | ORAL | 1 refills | Status: DC | PRN

## 2020-09-18 MED ORDER — HYDROCODONE-ACETAMINOPHEN 5-325 MG PO TABS: 1.0000 | ORAL_TABLET | Freq: Four times a day (QID) | ORAL | 0 refills | Status: DC | PRN

## 2020-09-23 ENCOUNTER — Encounter: Payer: Self-pay | Admitting: Internal Medicine

## 2020-09-24 ENCOUNTER — Other Ambulatory Visit (HOSPITAL_BASED_OUTPATIENT_CLINIC_OR_DEPARTMENT_OTHER): Payer: Self-pay | Admitting: Urology

## 2020-09-24 MED FILL — TESTOSTERONE CYP 200 MG/ML: 200 | 28 days supply | Qty: 10 | Fill #0

## 2020-09-30 ENCOUNTER — Other Ambulatory Visit (HOSPITAL_BASED_OUTPATIENT_CLINIC_OR_DEPARTMENT_OTHER): Payer: Self-pay | Admitting: Neurosurgery

## 2020-09-30 DIAGNOSIS — M4802 Spinal stenosis, cervical region: Secondary | ICD-10-CM | POA: Diagnosis not present

## 2020-09-30 DIAGNOSIS — M542 Cervicalgia: Secondary | ICD-10-CM | POA: Diagnosis not present

## 2020-09-30 DIAGNOSIS — M5030 Other cervical disc degeneration, unspecified cervical region: Secondary | ICD-10-CM | POA: Diagnosis not present

## 2020-09-30 DIAGNOSIS — M2578 Osteophyte, vertebrae: Secondary | ICD-10-CM | POA: Diagnosis not present

## 2020-09-30 MED FILL — tiZANidine HCL 4 MG TABS: 4 | 20 days supply | Qty: 60 | Fill #0

## 2020-09-30 MED FILL — HYDROCODON-APAP 5-325: 5-325 | 15 days supply | Qty: 60 | Fill #0

## 2020-10-01 ENCOUNTER — Ambulatory Visit: Payer: PPO | Admitting: Physician Assistant

## 2020-10-03 ENCOUNTER — Ambulatory Visit: Payer: PPO | Admitting: Physician Assistant

## 2020-10-03 ENCOUNTER — Encounter: Payer: Self-pay | Admitting: Physician Assistant

## 2020-10-03 ENCOUNTER — Other Ambulatory Visit: Payer: Self-pay | Admitting: Physician Assistant

## 2020-10-03 VITALS — BP 110/70 | HR 76 | Ht 72.0 in | Wt 208.2 lb

## 2020-10-03 DIAGNOSIS — Z86010 Personal history of colonic polyps: Secondary | ICD-10-CM

## 2020-10-03 DIAGNOSIS — K648 Other hemorrhoids: Secondary | ICD-10-CM | POA: Diagnosis not present

## 2020-10-03 DIAGNOSIS — R110 Nausea: Secondary | ICD-10-CM | POA: Diagnosis not present

## 2020-10-03 MED ORDER — NA SULFATE-K SULFATE-MG SULF 17.5-3.13-1.6 GM/177ML PO SOLN: 1.0000 | Freq: Once | ORAL | 0 refills | Status: DC

## 2020-10-03 NOTE — Progress Notes (Signed)
Chief Complaint: Internal hemorrhoids and nausea  HPI:    Erik [name] is a 65 year old male with a past medical history as listed below, known to Dr. Ardis Hughs, who presents to clinic today with a complaint of internal hemorrhoids and nausea.    09/20/2017 office visit with me for rectal pain.  At that time described a history of fissures and fistulas treated by [facility] gastroenterology in the past.  At that time anoscopy showed some purulent matter.  MRI of the pelvis with and without contrast was ordered for further abscess which was negative for fistula or abscess.  Patient was prescribed Cipro 500 twice daily and metronidazole 500 3 times daily for 7 days.    11/12/2017 Flex sigmoidoscopy for rectal pain/fullness and a normal pelvic MRI with known chronic prostatitis with findings of slightly irregular rectal mucosa which was biopsied and small external hemorrhoids.  Pathology with mild reactive changes.    09/07/2019 patient was seen in clinic and described that he was having frequent drainage in his throat which an allergist told him could be reflux.  Also complained of some broken ribs and some continued pain on the right upper quadrant.  Also complained of buttock pain more to the right side about 3 inches from his rectum.  At that time discussed that the patient increase his omeprazole to 40 mg twice daily.  Explained if this did not help his symptoms then he may want to see an allergist again or follow-up with his PCP.  Order right upper quadrant ultrasound as well as CBC, CMP and lipase.  Also discussed buttock pain which did not seem GI in origin and rectal exam was unrevealing.  Recommend he follow-up with Charlann Boxer, DO for musculoskeletal origin.    09/13/2019 right upper quadrant ultrasound with no abnormality.  CBC, CMP and lipase were normal.      11/07/2019 patient return to clinic and described that the drainage in his throat was 50% better since being started on twice daily omeprazole.   Also continue with pain in his buttock which had been there for years and no one could figure it out.  Recently saw his urologist did not think it had anything to do with them either.  At that time ordered a CT of the pelvis for further evaluation and continued omeprazole 40 mg twice daily.    11/17/2019 CT of the pelvis showed prostate gland was mildly prominent in size with central cystic/fluid density foci and remainder the pelvis CT was unremarkable.  At that point patient was told to follow-up with his PCP or Charlann Boxer to further discuss pain as it was not GI related.    Today, the patient presents to clinic and explains that he woke up in the middle of the night a few nights ago and it felt like someone was "inflating a balloon in my rectum".  He has continued with a full feeling with some discomfort in his rectum but no bleeding.  His PCP prescribed Proctofoam but he was unable to get this through his insurance so tried Preparation H suppositories which helped a little bit.    Also aware that he is due for colonoscopy, but has a cervical fusion scheduled within the next few weeks and would like to wait after this.    Patient also describes some nausea which typically hits him in the morning and last for 5 to 10 minutes, this is typically about 20 to 30 minutes after taking his morning vitamins.  Tells me he typically gets up in the morning takes his Omeprazole 40 mg and then has breakfast about an hour later and then will reach for his vitamins.  He has never vomited but this way of nausea is quite intense.    He is going to move to [place] to be closer to his son and 2 grandchildren, 61 and 7 years old in the summer.    Denies fever, chills, weight loss or abdominal pain.  Past Medical History:  Diagnosis Date  . ALLERGIC RHINITIS 12/19/2009  . Allergy   . Back pain chronic    On hydrocodone-Neurontin  . Blepharitis    B, chronic  . BPH (benign prostatic hyperplasia)   . Cervical vertebral  fusion 07/29/2020   1994 C4-5-6  . Chronic prostatitis     f/u by urology, has a prostate massage prn  . COVID-19 03/2019  . GERD (gastroesophageal reflux disease)   . Groin injury   . HYPOGONADISM, MALE 08/11/2007   f/u by Dr Thomasene Mohair urology    . OSTEOPOROSIS, IDIOPATHIC 08/11/2007   f/u at [facility] , now improved to osteopenia  . Raynaud's disease /phenomenon 08/16/2012  . Rosacea   . Shingles 09-2014  . Varicose veins     Past Surgical History:  Procedure Laterality Date  . BLEPHAROPLASTY     B blepharoplasty @ [facility] 08-2011  . CERVICAL DISCECTOMY  1998  . CERVICAL FUSION  1994   C4-C5-C6 fusion  . CHEILECTOMY Left 09/26/2019   Procedure: CHEILECTOMY LEFT GREAT TOE;  Surgeon: Newt Minion, MD;  Location: [facility];  Service: Orthopedics;  Laterality: Left;  . COLONOSCOPY    . EYE SURGERY     eyelid surgery on both eyes  . HERNIA REPAIR Left 1979  . IR GENERIC HISTORICAL  03/17/2016   IR EMBO VENOUS NOT HEMORR HEMANG  INC GUIDE ROADMAPPING 03/17/2016 Greggory Keen, MD [facility] ULTRASOUND  . KNEE ARTHROSCOPY    . POLYPECTOMY    . SHOULDER SURGERY Right    early 2000s  . SIGMOIDOSCOPY    . TRANSURETHRAL RESECTION OF PROSTATE  07-2009    Current Outpatient Medications  Medication Sig Dispense Refill  . amLODipine (NORVASC) 2.5 MG tablet Take 1 tablet (2.5 mg total) by mouth daily. 90 tablet 1  . B-D INTEGRA SYRINGE 23G X 1" 3 ML MISC     . gabapentin (NEURONTIN) 300 MG capsule Take 1-3 capsules (300-900 mg total) by mouth 3 (three) times daily as needed. 270 capsule 3  . HYDROcodone-acetaminophen (NORCO/VICODIN) 5-325 MG tablet Take 1 tablet by mouth every 6 (six) hours as needed. 15 tablet 0  . hydrocortisone-pramoxine (PROCTOFOAM-HC) rectal foam Place 1 applicator rectally 2 (two) times daily. 10 g 1  . hyoscyamine (LEVSIN SL) 0.125 MG SL tablet Place 1 tablet (0.125 mg total) under the tongue every 4 (four) hours as needed. Take 1 tab every 4 to 6 hours, under the  tongue as needed for abdominal spasms 30 tablet 1  . lidocaine (LINDAMANTLE) 3 % CREA cream Apply 1 application topically as needed.    . Lidocaine-Hydrocortisone Ace 1-1 % CREA Apply 1 application topically 2 (two) times daily as needed (internal hemorrhoids). 30 g 0  . Multiple Vitamin (MULTI-VITAMINS) TABS Take by mouth.    . Omega-3 Fatty Acids (FISH OIL) 1000 MG CAPS Take 1 capsule by mouth 2 (two) times daily.    Marland Kitchen omeprazole (PRILOSEC) 40 MG capsule TAKE 1 CAPSULE BY MOUTH 1-2 TIMES A DAY FOR  HEARTBURN AND REFLUX 60 capsule 5  . OVER THE COUNTER MEDICATION Take 1 tablet by mouth 2 (two) times daily. AREDS 2    . predniSONE (DELTASONE) 50 MG tablet Take 1 tablet (50 mg total) by mouth daily. 5 tablet 0  . sildenafil (REVATIO) 20 MG tablet TAKE 1 TABLET (20 MG TOTAL) BY MOUTH AS NEEDED FOR UP TO 30 DAYS.    Marland Kitchen tadalafil (CIALIS) 5 MG tablet Take 5 mg by mouth daily.    Marland Kitchen testosterone cypionate (DEPOTESTOTERONE CYPIONATE) 100 MG/ML injection Inject into the muscle every 14 (fourteen) days. For IM use only    . tiZANidine (ZANAFLEX) 4 MG tablet Take 1 tablet (4 mg total) by mouth every 6 (six) hours as needed for muscle spasms. 30 tablet 1   No current facility-administered medications for this visit.    Allergies as of 10/03/2020 - Review Complete 08/16/2020  Allergen Reaction Noted  . Sulfa drugs cross reactors Nausea And Vomiting 10/21/2011    Family History  Problem Relation Age of Onset  . COPD Father        Died, 71  . Hypertension Father   . Stroke Father   . CAD Father   . Allergic rhinitis Father   . Diabetes Mother   . Hypertension Mother   . CAD Mother           . Colon polyps Mother   . Cancer Mother        Ovarian cancer  . Hypertension Sister   . High Cholesterol Sister   . Colon cancer Maternal Aunt   . Prostate cancer Neg Hx   . Esophageal cancer Neg Hx   . Rectal cancer Neg Hx   . Stomach cancer Neg Hx   . Pancreatic cancer Neg Hx   . Angioedema Neg Hx    . Asthma Neg Hx   . Eczema Neg Hx   . Immunodeficiency Neg Hx   . Urticaria Neg Hx     Social History   Socioeconomic History  . Marital status: Married    Spouse name: Not on file  . Number of children: 3  . Years of education: Not on file  . Highest education level: Not on file  Occupational History  . Occupation: real [employer]   Tobacco Use  . Smoking status: Never Smoker  . Smokeless tobacco: Never Used  Vaping Use  . Vaping Use: Never used  Substance and Sexual Activity  . Alcohol use: No    Alcohol/week: 0.0 standard drinks  . Drug use: No  . Sexual activity: Yes  Other Topics Concern  . Not on file  Social History Narrative   Lives with wife.  They have three children.   Daughter is an Ship broker     Social Determinants of Radio broadcast assistant Strain: Not on file  Food Insecurity: Not on file  Transportation Needs: Not on file  Physical Activity: Not on file  Stress: Not on file  Social Connections: Not on file  Intimate Partner Violence: Not on file    Review of Systems:    Constitutional: No weight loss, fever or chills Cardiovascular: No chest pain Respiratory: No SOB  Gastrointestinal: See HPI and otherwise negative   Physical Exam:  Vital signs: BP 110/70   Pulse 76   Ht 6' (1.829 m)   Wt 208 lb 4 oz (94.5 kg)   BMI 28.24 kg/m   Constitutional:   Pleasant Caucasian male appears to be in NAD, Well  developed, Well nourished, alert and cooperative Respiratory: Respirations even and unlabored. Lungs clear to auscultation bilaterally.   No wheezes, crackles, or rhonchi.  Cardiovascular: Normal S1, S2. No MRG. Regular rate and rhythm. No peripheral edema, cyanosis or pallor.  Gastrointestinal:  Soft, nondistended, nontender. No rebound or guarding. Normal bowel sounds. No appreciable masses or hepatomegaly. Rectal:  Declined Psychiatric: Demonstrates good judgement and reason without abnormal affect or behaviors.  No recent labs or  imaging.  Assessment: 1.  Internal hemorrhoids: Patient feels a fullness and some discomfort, slightly relieved with over-the-counter Preparation H suppositories 2.  History of adenomatous polyps: Last colonoscopy in 2017 with recommendations to repeat in 5 years  Plan: 1.  Scheduled patient for a surveillance colonoscopy in the [place] with Dr. Ardis Hughs.  He requested this to be scheduled in May after his cervical fusion and before he moves to [place].  Did provide the patient with a detailed list of risks for the procedure and he agrees to proceed. 2.  Prescribed Hydrocortisone ointment to be applied to Preparation H suppository and inserted into the rectum twice daily for 2 weeks.  Discussed with the patient that he should not use this any longer than 2 weeks in a row as it can thin the rectal tissue. 3.  Patient did discuss some nausea today, we decided that he should try taking his vitamins at night to see if this helps. 4.  Patient to follow in clinic per recommendations from Dr. Ardis Hughs after time of procedure.  Ellouise Newer, PA-C [facility] 10/03/2020, 9:05 AM  Cc: Colon Branch, MD

## 2020-10-03 NOTE — Patient Instructions (Addendum)
If you are age 67 or older, your body mass index should be between 23-30. Your Body mass index is 28.24 kg/m. If this is out of the aforementioned range listed, please consider follow up with your Primary Care Provider.  If you are age 58 or younger, your body mass index should be between 19-25. Your Body mass index is 28.24 kg/m. If this is out of the aformentioned range listed, please consider follow up with your Primary Care Provider.   You have been scheduled for a colonoscopy. Please follow written instructions given to you at your visit today.  Please pick up your prep supplies at the pharmacy within the next 1-3 days. If you use inhalers (even only as needed), please bring them with you on the day of your procedure.  Try to take the vitamins at night.  Hydrocortisone ointment on prep H. Suppositories   It was a pleasure to see you today!  Ellouise Newer, [place]

## 2020-10-03 NOTE — Progress Notes (Signed)
I agree with the above note, plan 

## 2020-10-05 ENCOUNTER — Other Ambulatory Visit (HOSPITAL_BASED_OUTPATIENT_CLINIC_OR_DEPARTMENT_OTHER): Payer: Self-pay

## 2020-10-05 MED FILL — Sod Sulfate-Pot Sulf-Mg Sulf Oral Sol 17.5-3.13-1.6 GM/177ML: ORAL | 1 days supply | Qty: 354 | Fill #0 | Status: CN

## 2020-10-07 ENCOUNTER — Other Ambulatory Visit: Payer: Self-pay | Admitting: Neurosurgery

## 2020-10-07 DIAGNOSIS — M2578 Osteophyte, vertebrae: Secondary | ICD-10-CM | POA: Insufficient documentation

## 2020-10-07 DIAGNOSIS — R310 Gross hematuria: Secondary | ICD-10-CM | POA: Diagnosis not present

## 2020-10-07 DIAGNOSIS — R319 Hematuria, unspecified: Secondary | ICD-10-CM | POA: Diagnosis not present

## 2020-10-07 DIAGNOSIS — N411 Chronic prostatitis: Secondary | ICD-10-CM | POA: Diagnosis not present

## 2020-10-07 DIAGNOSIS — E291 Testicular hypofunction: Secondary | ICD-10-CM | POA: Diagnosis not present

## 2020-10-07 DIAGNOSIS — M5412 Radiculopathy, cervical region: Secondary | ICD-10-CM | POA: Insufficient documentation

## 2020-10-08 NOTE — H&P (Signed)
Patient ID:   [id number] Patient: Erik [name]  Date of Birth: 06/10/1956 Visit Type: Office Visit   Date: 09/30/2020 11:45 AM Provider: Marchia Meiers. Vertell Limber MD   This 65 year old male presents for EMG Review.  HISTORY OF PRESENT ILLNESS: 1.  EMG Review  Patient returns to review his EMG/NCS and discuss surgical options.  EMG and nerve conduction velocities demonstrate old C5 radiculopathy without carpal tunnel syndrome.  The patient is complaining of 7/10 pain.  He has significant stenosis and spondylosis with foraminal stenosis at C3-4.  He has had prior anterior cervical decompression and fusion at the C4 through C6 levels by Dr. Talitha Givens.  This was done from the right side.  He is having to take considerable pain medication and is not improving with regard to his symptoms he continues to complain of significant C4 radiculopathy.  I have recommended proceeding with surgery.  This will consist of exploration of C4 through C6 fusion with removal of CS LP plate and anterior cervical decompression and fusion at the C3-4 level.  Approach will be from the right.      Medical/Surgical/Interim History Reviewed, no change.  Last detailed document date:08/26/2020.     PAST MEDICAL HISTORY, SURGICAL HISTORY, FAMILY HISTORY, SOCIAL HISTORY AND REVIEW OF SYSTEMS I have reviewed the patient's past medical, surgical, family and social history as well as the comprehensive review of systems as included on the [facility] history form dated 08/26/2020, which I have signed.  Family History: Reviewed, no changes.  Last detailed document date:08/26/2020.   Social History: Reviewed, no changes. Last detailed document date: 08/26/2020.    MEDICATIONS: (added, continued or stopped this visit) Started Medication Directions Instruction Stopped  cilostazol 100 mg tablet     gabapentin 300 mg capsule     hydrocodone 5 mg-acetaminophen 325 mg  tablet take 1 tablet by oral route  every 6 hours as needed for pain  09/30/2020 09/30/2020 hydrocodone 5 mg-acetaminophen 325 mg tablet take 1 tablet by oral route  every 6 hours as needed for pain    tizanidine 4 mg tablet take 1 tablet by oral route  1 - 3 times every day  09/30/2020 09/30/2020 tizanidine 4 mg tablet take 1 tablet by oral route up to tid prn      ALLERGIES: Ingredient Reaction Medication Name Comment SULFA (SULFONAMIDE ANTIBIOTICS)     Reviewed, no changes.    PHYSICAL EXAM:  Vitals Date Temp F BP Pulse Ht In Wt Lb BMI BSA Pain Score 09/30/2020  128/86 63 72 209.4 28.4  7/10     IMPRESSION:  Herniated cervical disc C3-4 with cervical radiculopathy and prior fusion C4 through C6 levels  PLAN: Proceed with anterior cervical decompression and fusion of C3-4 level with removal of previous hardware and exploration of prior fusion.  We refilled Norco and tizanidine for the patient.  We are tentatively planning on surgery at [facility] on 10/17/20  Orders: Diagnostic Procedures: Assessment Procedure M54.12 Cervical Spine- Lateral Instruction(s)/Education: Assessment Instruction R03.0 Lifestyle education Miscellaneous: Assessment  M48.02 Soft Cervical Collar  Completed Orders (this encounter) Order Details Reason Side Interpretation Result Initial Treatment Date Region Lifestyle education Patient will monitor and contact primary care physician if needed.        Assessment/Plan  # Detail Type Description  1. 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     2. 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     3. Assessment Foraminal stenosis of cervical region (M48.02).  Plan Orders Soft Cervical Collar.     4. Assessment Cervicalgia (M54.2).     5. Assessment Cervical spondylosis (M47.812).     6. Assessment Cervical radiculopathy  (M54.12).     7. Assessment Elevated blood-pressure reading, w/o diagnosis of htn (R03.0).       Pain Management Plan Pain Scale: 7/10. Method: Numeric Pain Intensity Scale. Location: neck. Onset: 11/04/2019. Duration: varies. Quality: burning. Pain management follow-up plan of care: Patient will continue medication management.Marland Kitchen     MEDICATIONS PRESCRIBED TODAY    Rx Quantity Refills TIZANIDINE HCL 4 mg  60 0 HYDROCODONE-ACETAMINOPHEN 5 mg-325 mg  60 0           Provider:  Marchia Meiers. Vertell Limber MD  10/03/2020 02:46 PM    Dictation edited by: Marchia Meiers. Vertell Limber    CC Providers: [facility] [address] [place],  [place]  [address]-   Sequoyah Ramone MD  [address] [place], [place] [address]               Electronically signed by Marchia Meiers. Vertell Limber MD on 10/03/2020 02:46 PM Patient ID:   [id number] Patient: Erik [name]  Date of Birth: 1956/06/17 Visit Type: Office Visit   Date: 08/26/2020 09:30 AM Provider: Marchia Meiers. Vertell Limber MD   This 65 year old male presents for arm/neck pain.  HISTORY OF PRESENT ILLNESS: 1.  arm/neck pain  Erik [name] is a 65 year old male who is referred for neurosurgical evaluation by [facility] due to complaints of neck and left arm pain.  The patient reports that his symptoms began last fall and was unable to recount if there was any precipitating or traumatic events surrounding the onset of his symptoms.  He initially reported mostly left shoulder pain and sought evaluation at the [facility]  He initially went received a left shoulder injection which made very little change in his symptoms.  He then underwent 12 treatments with the chiropractor with no change in his symptoms.  He was then sent for MRI of the shoulder which was unremarkable.  He reports that his symptoms then progressed to encompass his neck, parascapular region, left shoulder, and left biceps.  He  also notes occasional pain in his right upper extremity, but mostly is in his left.  He also reports left hand pain that was severe a few weeks ago and has since decreased in severity, and paresthesia in his fingers.  He currently rates his pain to be a 3/10 and an 8/10 at its worst.  A few weeks ago he underwent an epidural steroid injection at [facility] radiology at the C7-T1 level.  He reports that after the [facility] he received a few day improvement in his symptoms, but then when he went to raise his arms above his head, the symptoms returned.  He denies weakness.  He reports difficulty and severe pain with sleeping, showering, dressing, and driving. Treatments have included ESI, gabapentin, Celebrex, p.o. Steroids, Norco, ibuprofen, acetaminophen, and muscle relaxers.   Past medical history:  Osteopenia (history of osteoporosis)  Past surgical history:  1994 C4-5, C5-6 ACDF, 1997 L4-5 microdiskectomy, right shoulder bone spur removal, right inguinal hernia repair       PAST MEDICAL/SURGICAL HISTORY:   (Detailed)   Disease/disorder Onset Date Management Date Comments   Spinal fusion, cervical   Osteoporosis       Discectomy, lumbar     Bone Spur removal, right shoulder     Arthroscopy knee  [facility] 08/26/2020 -  Hernia repair     Cyst removal, left foot      Family History:  (Detailed)   Social History:  (Detailed) Tobacco use reviewed. Preferred language is Unknown.   Tobacco use status: Current non-smoker. Smoking status: Never smoker.  SMOKING STATUS Type Smoking Status Usage Per Day Years Used Total Pack Years  Never smoker         MEDICATIONS: (added, continued or stopped this visit) Started Medication Directions Instruction Stopped  cilostazol 100 mg tablet     gabapentin 300 mg capsule       ALLERGIES: Ingredient Reaction Medication Name Comment SULFA (SULFONAMIDE ANTIBIOTICS)     Reviewed,  updated.    PHYSICAL EXAM:  Vitals Date Temp F BP Pulse Ht In Wt Lb BMI BSA Pain Score 08/26/2020  124/81 74 72 241 32.69  3/10   PHYSICAL EXAM Details General Level of Distress: no acute distress Overall Appearance: normal    Cardiovascular Cardiac: regular rate and rhythm without murmur  Right Left  Carotid Pulses: normal normal  Respiratory Lungs: clear to auscultation  Neurological Orientation: normal Recent and Remote Memory: normal Attention Span and Concentration:   normal Language: normal Fund of Knowledge: normal  Right Left Sensation: normal normal Upper Extremity Coordination: normal normal  Lower Extremity Coordination: normal normal  Musculoskeletal Gait and Station: normal  Right Left Upper Extremity Muscle Strength: normal normal Lower Extremity Muscle Strength: normal normal Upper Extremity Muscle Tone:  normal normal Lower Extremity Muscle Tone: normal normal   Motor Strength Upper and lower extremity motor strength was tested in the clinically pertinent muscles.     Deep Tendon Reflexes  Right Left Biceps: normal normal Triceps: normal normal Brachioradialis: normal normal Patellar: normal normal Achilles: normal normal  Sensory Sensation was tested at C2 to T1.   Cranial Nerves II. Optic Nerve/Visual Fields: normal III. Oculomotor: normal IV. Trochlear: normal V. Trigeminal: normal VI. Abducens: normal VII. Facial: normal VIII. Acoustic/Vestibular: normal IX. Glossopharyngeal: normal X. Vagus: normal XI. Spinal Accessory: normal XII. Hypoglossal: normal  Motor and other Tests Lhermittes: negative Rhomberg: negative Pronator drift: absent     Right Left Spurlings positive positive Hoffman's: normal normal Clonus: normal normal Babinski: normal normal SLR: negative negative Patrick's Corky Sox): negative negative Toe Walk: normal normal Toe Lift: normal normal Heel Walk: normal normal Tinels  Elbow: negative positive Tinels Wrist: positive positive Phalen: negative positive   Additional Findings:  Decreased range of motion and positive impingement testing on the left shoulder.   DIAGNOSTIC RESULTS:  Cervical MRI and four view cervical radiographs reviewed with patient.  The patient has degenerative and arthritic changes throughout the cervical spine.  C4-5 and C5-6 shows solid-appearing fusion.  At the C3-4 level there is significant degeneration, arthritis, and posterior osteophyte formation leading to multifactorial spinal stenosis and severe foraminal stenosis.  At the C6-7 level there is mild foraminal stenosis bilaterally.  The C7-T1 level there is an osteophyte on the left.    IMPRESSION:  Patient with multi month history of progressively worsening neck and left upper extremity radiculopathy.  On examination he has positive shoulder impingement testing with decreased range of motion in his left shoulder.  He has a positive Tinel's at the wrist bilaterally and a positive Tinel's at the elbow on the left and a positive balance sign on the left with splitting of the ring finger.  He also has a positive Spurling's bilaterally.  He has full strength on confrontational testing.  His imaging is most remarkable for adjacent  level disease at the C3-4 level with severe bilateral foraminal stenosis and spinal stenosis. It is hard to discern whether the patient's symptoms are solely coming from his cervical spine or if there is also peripheral nerve involvement.   PLAN: Tentative plan for C3-4 ACDF.  We will send the patient for a bilateral upper extremity NCV/EMG to better assess if his symptoms are partly due to his carpal tunnel syndrome. He will follow-up in the clinic after his study has been completed.  The patient's surgical records from his 27 ACDF will be requested.  Orders: Diagnostic Procedures: Assessment Procedure M54.12 Bilateral Upper Extremity  EMG/NCV M54.12 Cervical Spine- AP/Lat/Flex/Ex Instruction(s)/Education: Assessment Instruction R03.0 Lifestyle education [phone] Lifestyle education regarding diet  Completed Orders (this encounter) Order Details Reason Side Interpretation Result Initial Treatment Date Region Lifestyle education regarding diet Encouraged patient to eat well balanced diet.       Lifestyle education Patient will continue to monitor and contact primary care physician if needed.       Cervical Spine- AP/Lat/Flex/Ex      08/26/2020 All Levels to All Levels  Assessment/Plan  # Detail Type Description  1. 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     2. Assessment Cervical spondylosis (M47.812).     3. Assessment Cervicalgia (M54.2).     4. 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     5. Assessment Foraminal stenosis of cervical region (M48.02).     6. Assessment Cervical radiculopathy (M54.12).  Plan Orders Further diagnostic evaluations ordered today include(s) Bilateral Upper Extremity EMG/NCV to be performed.     7. Assessment Body mass index (BMI) 32.0-32.9, adult (O87.57).  Plan Orders Today's instructions / counseling include(s) Lifestyle education regarding diet. Clinical information/comments: Encouraged patient to eat well balanced diet.     8. Assessment Elevated blood-pressure reading, w/o diagnosis of htn (R03.0).       Pain Management Plan Pain Scale: 3/10. Method: Numeric Pain Intensity Scale. Location: neck/arm. Onset: 11/04/2019. Duration: varies. Quality: discomforting. Pain management follow-up plan of care: Patient will continue medication management..              Provider:  Marchia Meiers. Vertell Limber MD  08/26/2020 04:57 PM    Dictation edited by: Fenton Malling, NP    CC Providers: Erline Levine MD  [address] [place], [place]  [address]               Electronically signed by Fenton Malling NP on 08/26/2020 04:57 PM  on behalf of Marchia Meiers. Vertell Limber MD

## 2020-10-09 ENCOUNTER — Other Ambulatory Visit (HOSPITAL_BASED_OUTPATIENT_CLINIC_OR_DEPARTMENT_OTHER): Payer: Self-pay

## 2020-10-10 ENCOUNTER — Other Ambulatory Visit (HOSPITAL_BASED_OUTPATIENT_CLINIC_OR_DEPARTMENT_OTHER): Payer: Self-pay

## 2020-10-10 ENCOUNTER — Other Ambulatory Visit: Payer: Self-pay

## 2020-10-10 MED ORDER — HYDROCORTISONE (PERIANAL) 2.5 % EX CREA
1.0000 "application " | TOPICAL_CREAM | Freq: Two times a day (BID) | CUTANEOUS | 1 refills | Status: AC
  Filled 2020-10-10 – 2020-10-16 (×2): qty 30, 10d supply, fill #0

## 2020-10-10 NOTE — Progress Notes (Signed)
hydro 

## 2020-10-11 ENCOUNTER — Encounter: Payer: Self-pay | Admitting: Family Medicine

## 2020-10-14 ENCOUNTER — Other Ambulatory Visit (HOSPITAL_COMMUNITY)
Admission: RE | Admit: 2020-10-14 | Discharge: 2020-10-14 | Disposition: A | Discharge status: Home / Self Care | Payer: PPO | Source: Ambulatory Visit | Attending: Neurosurgery | Admitting: Neurosurgery

## 2020-10-14 ENCOUNTER — Other Ambulatory Visit (HOSPITAL_BASED_OUTPATIENT_CLINIC_OR_DEPARTMENT_OTHER): Payer: Self-pay

## 2020-10-14 DIAGNOSIS — M5011 Cervical disc disorder with radiculopathy,  high cervical region: Secondary | ICD-10-CM | POA: Diagnosis present

## 2020-10-14 DIAGNOSIS — Z981 Arthrodesis status: Secondary | ICD-10-CM | POA: Diagnosis not present

## 2020-10-14 DIAGNOSIS — Z01812 Encounter for preprocedural laboratory examination: Secondary | ICD-10-CM | POA: Insufficient documentation

## 2020-10-14 DIAGNOSIS — Z79899 Other long term (current) drug therapy: Secondary | ICD-10-CM | POA: Diagnosis not present

## 2020-10-14 DIAGNOSIS — Z20822 Contact with and (suspected) exposure to covid-19: Secondary | ICD-10-CM | POA: Diagnosis present

## 2020-10-14 DIAGNOSIS — M47812 Spondylosis without myelopathy or radiculopathy, cervical region: Secondary | ICD-10-CM | POA: Diagnosis not present

## 2020-10-14 DIAGNOSIS — M4802 Spinal stenosis, cervical region: Secondary | ICD-10-CM | POA: Diagnosis present

## 2020-10-14 DIAGNOSIS — Z882 Allergy status to sulfonamides status: Secondary | ICD-10-CM | POA: Diagnosis not present

## 2020-10-14 DIAGNOSIS — M4322 Fusion of spine, cervical region: Secondary | ICD-10-CM | POA: Diagnosis not present

## 2020-10-14 DIAGNOSIS — M2578 Osteophyte, vertebrae: Secondary | ICD-10-CM | POA: Diagnosis present

## 2020-10-14 DIAGNOSIS — M4722 Other spondylosis with radiculopathy, cervical region: Secondary | ICD-10-CM | POA: Diagnosis not present

## 2020-10-14 DIAGNOSIS — M810 Age-related osteoporosis without current pathological fracture: Secondary | ICD-10-CM | POA: Diagnosis not present

## 2020-10-14 DIAGNOSIS — M859 Disorder of bone density and structure, unspecified: Secondary | ICD-10-CM | POA: Diagnosis not present

## 2020-10-14 DIAGNOSIS — M5001 Cervical disc disorder with myelopathy,  high cervical region: Secondary | ICD-10-CM | POA: Diagnosis present

## 2020-10-14 DIAGNOSIS — K219 Gastro-esophageal reflux disease without esophagitis: Secondary | ICD-10-CM | POA: Diagnosis not present

## 2020-10-14 DIAGNOSIS — M5136 Other intervertebral disc degeneration, lumbar region: Secondary | ICD-10-CM | POA: Diagnosis not present

## 2020-10-14 LAB — SARS CORONAVIRUS 2 (TAT 6-24 HRS): SARS Coronavirus 2: NEGATIVE

## 2020-10-16 ENCOUNTER — Encounter (HOSPITAL_COMMUNITY): Payer: Self-pay | Admitting: Neurosurgery

## 2020-10-16 ENCOUNTER — Other Ambulatory Visit: Payer: Self-pay

## 2020-10-16 ENCOUNTER — Other Ambulatory Visit (HOSPITAL_BASED_OUTPATIENT_CLINIC_OR_DEPARTMENT_OTHER): Payer: Self-pay

## 2020-10-16 NOTE — Progress Notes (Signed)
Patient was given pre-op instructions over the phone. The opportunity was given for the patient to ask questions. No further questions asked. Patient verbalized understanding of instructions given.   Nothing to eat or drink after midnight.  7 days prior to surgery STOP taking any Aspirin (unless otherwise instructed by your surgeon), Aleve, Naproxen, Ibuprofen, Motrin, Advil, Goody's, BC's, all herbal medications, fish oil, and all vitamins.   Anesthesia Review Needed: n/a

## 2020-10-17 ENCOUNTER — Inpatient Hospital Stay (HOSPITAL_COMMUNITY): Payer: PPO | Admitting: Certified Registered Nurse Anesthetist

## 2020-10-17 ENCOUNTER — Other Ambulatory Visit: Payer: Self-pay

## 2020-10-17 ENCOUNTER — Encounter (HOSPITAL_COMMUNITY): Payer: Self-pay | Admitting: Neurosurgery

## 2020-10-17 ENCOUNTER — Inpatient Hospital Stay (HOSPITAL_COMMUNITY)
Admission: RE | Admit: 2020-10-17 | Discharge: 2020-10-18 | DRG: 472 | Disposition: A | Discharge status: Home / Self Care | Payer: PPO | Attending: Neurosurgery | Admitting: Neurosurgery

## 2020-10-17 ENCOUNTER — Inpatient Hospital Stay (HOSPITAL_COMMUNITY): Payer: PPO

## 2020-10-17 ENCOUNTER — Encounter (HOSPITAL_COMMUNITY)
Admission: RE | Disposition: A | Discharge status: Home / Self Care | Payer: Self-pay | Source: Home / Self Care | Attending: Neurosurgery

## 2020-10-17 DIAGNOSIS — M4802 Spinal stenosis, cervical region: Secondary | ICD-10-CM | POA: Diagnosis present

## 2020-10-17 DIAGNOSIS — M2578 Osteophyte, vertebrae: Secondary | ICD-10-CM | POA: Diagnosis present

## 2020-10-17 DIAGNOSIS — M5001 Cervical disc disorder with myelopathy,  high cervical region: Secondary | ICD-10-CM | POA: Diagnosis present

## 2020-10-17 DIAGNOSIS — Z419 Encounter for procedure for purposes other than remedying health state, unspecified: Secondary | ICD-10-CM

## 2020-10-17 DIAGNOSIS — M5011 Cervical disc disorder with radiculopathy,  high cervical region: Secondary | ICD-10-CM | POA: Diagnosis present

## 2020-10-17 DIAGNOSIS — Z79899 Other long term (current) drug therapy: Secondary | ICD-10-CM

## 2020-10-17 DIAGNOSIS — Z20822 Contact with and (suspected) exposure to covid-19: Secondary | ICD-10-CM | POA: Diagnosis present

## 2020-10-17 DIAGNOSIS — Z981 Arthrodesis status: Secondary | ICD-10-CM

## 2020-10-17 DIAGNOSIS — Z882 Allergy status to sulfonamides status: Secondary | ICD-10-CM

## 2020-10-17 HISTORY — DX: Nausea with vomiting, unspecified: R11.2

## 2020-10-17 HISTORY — DX: Unspecified osteoarthritis, unspecified site: M19.90

## 2020-10-17 HISTORY — DX: Pneumonia, unspecified organism: J18.9

## 2020-10-17 HISTORY — DX: Other specified postprocedural states: Z98.890

## 2020-10-17 HISTORY — PX: ANTERIOR CERVICAL DECOMP/DISCECTOMY FUSION: SHX1161

## 2020-10-17 LAB — CBC
HCT: 46.5 % (ref 39.0–52.0)
Hemoglobin: 16 g/dL (ref 13.0–17.0)
MCH: 31.6 pg (ref 26.0–34.0)
MCHC: 34.4 g/dL (ref 30.0–36.0)
MCV: 91.9 fL (ref 80.0–100.0)
Platelets: 224 10*3/uL (ref 150–400)
RBC: 5.06 MIL/uL (ref 4.22–5.81)
RDW: 12.9 % (ref 11.5–15.5)
WBC: 4 10*3/uL (ref 4.0–10.5)
nRBC: 0 % (ref 0.0–0.2)

## 2020-10-17 LAB — BASIC METABOLIC PANEL
Anion gap: 9 (ref 5–15)
BUN: 19 mg/dL (ref 8–23)
CO2: 23 mmol/L (ref 22–32)
Calcium: 8.8 mg/dL — ABNORMAL LOW (ref 8.9–10.3)
Chloride: 106 mmol/L (ref 98–111)
Creatinine, Ser: 0.75 mg/dL (ref 0.61–1.24)
GFR, Estimated: >60 mL/min (ref >60)
Glucose, Bld: 101 mg/dL — ABNORMAL HIGH (ref 70–99)
Potassium: 3.8 mmol/L (ref 3.5–5.1)
Sodium: 138 mmol/L (ref 135–145)

## 2020-10-17 LAB — TYPE AND SCREEN
ABO/RH(D): A POS
Antibody Screen: NEGATIVE

## 2020-10-17 LAB — ABO/RH: ABO/RH(D): A POS

## 2020-10-17 SURGERY — ANTERIOR CERVICAL DECOMPRESSION/DISCECTOMY FUSION 1 LEVEL/HARDWARE REMOVAL
Anesthesia: General | Site: Neck

## 2020-10-17 MED ORDER — SODIUM CHLORIDE 0.9% FLUSH: 3.0000 mL | INTRAVENOUS | Status: DC | PRN

## 2020-10-17 MED ORDER — SODIUM CHLORIDE 0.9 % IV SOLN
250.0000 mL | INTRAVENOUS | Status: DC
  Administered 2020-10-17: 250 mL via INTRAVENOUS

## 2020-10-17 MED ORDER — HYDROCODONE-ACETAMINOPHEN 5-325 MG PO TABS: 2.0000 | ORAL_TABLET | ORAL | Status: DC | PRN

## 2020-10-17 MED ORDER — OXYCODONE HCL 5 MG PO TABS: 5.0000 mg | ORAL_TABLET | ORAL | Status: DC | PRN

## 2020-10-17 MED ORDER — ORAL CARE MOUTH RINSE: 15.0000 mL | Freq: Once | OROMUCOSAL | Status: AC

## 2020-10-17 MED ORDER — BUPIVACAINE HCL (PF) 0.5 % IJ SOLN
INTRAMUSCULAR | Status: AC
  Filled 2020-10-17: qty 30

## 2020-10-17 MED ORDER — PHENYLEPHRINE HCL-NACL 10-0.9 MG/250ML-% IV SOLN
INTRAVENOUS | Status: DC | PRN
  Administered 2020-10-17 (×2): 15 ug/min via INTRAVENOUS

## 2020-10-17 MED ORDER — LIDOCAINE-EPINEPHRINE 1 %-1:100000 IJ SOLN
INTRAMUSCULAR | Status: DC | PRN
  Administered 2020-10-17: 5 mL

## 2020-10-17 MED ORDER — ROCURONIUM BROMIDE 10 MG/ML (PF) SYRINGE
PREFILLED_SYRINGE | INTRAVENOUS | Status: DC | PRN
  Administered 2020-10-17: 30 mg via INTRAVENOUS
  Administered 2020-10-17: 70 mg via INTRAVENOUS

## 2020-10-17 MED ORDER — TIZANIDINE HCL 4 MG PO TABS: 4.0000 mg | ORAL_TABLET | Freq: Four times a day (QID) | ORAL | Status: DC | PRN

## 2020-10-17 MED ORDER — ONDANSETRON HCL 4 MG/2ML IJ SOLN
INTRAMUSCULAR | Status: DC | PRN
  Administered 2020-10-17: 4 mg via INTRAVENOUS

## 2020-10-17 MED ORDER — HYDROMORPHONE HCL 1 MG/ML IJ SOLN
INTRAMUSCULAR | Status: AC
  Administered 2020-10-17: 0.5 mg via INTRAVENOUS
  Filled 2020-10-17: qty 1

## 2020-10-17 MED ORDER — ROCURONIUM BROMIDE 10 MG/ML (PF) SYRINGE
PREFILLED_SYRINGE | INTRAVENOUS | Status: AC
  Filled 2020-10-17: qty 20

## 2020-10-17 MED ORDER — FENTANYL CITRATE (PF) 250 MCG/5ML IJ SOLN
INTRAMUSCULAR | Status: DC | PRN
  Administered 2020-10-17 (×3): 50 ug via INTRAVENOUS
  Administered 2020-10-17: 100 ug via INTRAVENOUS

## 2020-10-17 MED ORDER — CHLORHEXIDINE GLUCONATE CLOTH 2 % EX PADS: 6.0000 | MEDICATED_PAD | Freq: Once | CUTANEOUS | Status: DC

## 2020-10-17 MED ORDER — ACETAMINOPHEN 325 MG PO TABS
650.0000 mg | ORAL_TABLET | ORAL | Status: DC | PRN
  Administered 2020-10-17 – 2020-10-18 (×2): 650 mg via ORAL
  Filled 2020-10-17 (×2): qty 2

## 2020-10-17 MED ORDER — POLYETHYLENE GLYCOL 3350 17 G PO PACK: 17.0000 g | PACK | Freq: Every day | ORAL | Status: DC | PRN

## 2020-10-17 MED ORDER — DEXMEDETOMIDINE (PRECEDEX) IN NS 20 MCG/5ML (4 MCG/ML) IV SYRINGE
PREFILLED_SYRINGE | INTRAVENOUS | Status: DC | PRN
  Administered 2020-10-17 (×2): 6 ug via INTRAVENOUS

## 2020-10-17 MED ORDER — FLEET ENEMA 7-19 GM/118ML RE ENEM: 1.0000 | ENEMA | Freq: Once | RECTAL | Status: DC | PRN

## 2020-10-17 MED ORDER — OXYCODONE HCL 5 MG PO TABS
5.0000 mg | ORAL_TABLET | Freq: Once | ORAL | Status: AC | PRN
  Administered 2020-10-17: 5 mg via ORAL

## 2020-10-17 MED ORDER — CHLORHEXIDINE GLUCONATE 0.12 % MT SOLN
OROMUCOSAL | Status: AC
  Administered 2020-10-17: 15 mL via OROMUCOSAL
  Filled 2020-10-17: qty 15

## 2020-10-17 MED ORDER — LIDOCAINE 2% (20 MG/ML) 5 ML SYRINGE
INTRAMUSCULAR | Status: AC
  Filled 2020-10-17: qty 10

## 2020-10-17 MED ORDER — THROMBIN 5000 UNITS EX SOLR: OROMUCOSAL | Status: DC | PRN

## 2020-10-17 MED ORDER — HYDROXYZINE HCL 50 MG/ML IM SOLN
50.0000 mg | Freq: Four times a day (QID) | INTRAMUSCULAR | Status: DC | PRN
  Administered 2020-10-17: 50 mg via INTRAMUSCULAR
  Filled 2020-10-17: qty 1

## 2020-10-17 MED ORDER — ONDANSETRON HCL 4 MG/2ML IJ SOLN
INTRAMUSCULAR | Status: AC
  Filled 2020-10-17: qty 4

## 2020-10-17 MED ORDER — BISACODYL 10 MG RE SUPP: 10.0000 mg | Freq: Every day | RECTAL | Status: DC | PRN

## 2020-10-17 MED ORDER — DEXAMETHASONE SODIUM PHOSPHATE 10 MG/ML IJ SOLN
INTRAMUSCULAR | Status: AC
  Filled 2020-10-17: qty 2

## 2020-10-17 MED ORDER — METHOCARBAMOL 500 MG PO TABS
ORAL_TABLET | ORAL | Status: AC
  Filled 2020-10-17: qty 1

## 2020-10-17 MED ORDER — TIZANIDINE HCL 4 MG PO TABS
4.0000 mg | ORAL_TABLET | Freq: Every day | ORAL | Status: DC
  Administered 2020-10-17: 4 mg via ORAL
  Filled 2020-10-17: qty 1

## 2020-10-17 MED ORDER — CEFAZOLIN SODIUM-DEXTROSE 2-4 GM/100ML-% IV SOLN
2.0000 g | INTRAVENOUS | Status: AC
  Administered 2020-10-17: 2 g via INTRAVENOUS
  Filled 2020-10-17: qty 100

## 2020-10-17 MED ORDER — ONDANSETRON HCL 4 MG/2ML IJ SOLN: 4.0000 mg | Freq: Four times a day (QID) | INTRAMUSCULAR | Status: DC | PRN

## 2020-10-17 MED ORDER — OXYCODONE HCL 5 MG PO TABS
5.0000 mg | ORAL_TABLET | ORAL | Status: DC | PRN
Start: 2020-10-17 — End: 2020-10-18
  Administered 2020-10-17 – 2020-10-18 (×5): 10 mg via ORAL
  Filled 2020-10-17 (×5): qty 2

## 2020-10-17 MED ORDER — LIDOCAINE-EPINEPHRINE 1 %-1:100000 IJ SOLN
INTRAMUSCULAR | Status: AC
  Filled 2020-10-17: qty 1

## 2020-10-17 MED ORDER — DEXMEDETOMIDINE (PRECEDEX) IN NS 20 MCG/5ML (4 MCG/ML) IV SYRINGE
PREFILLED_SYRINGE | INTRAVENOUS | Status: AC
  Filled 2020-10-17: qty 5

## 2020-10-17 MED ORDER — SUGAMMADEX SODIUM 200 MG/2ML IV SOLN
INTRAVENOUS | Status: DC | PRN
  Administered 2020-10-17: 200 mg via INTRAVENOUS

## 2020-10-17 MED ORDER — MENTHOL 3 MG MT LOZG: 1.0000 | LOZENGE | OROMUCOSAL | Status: DC | PRN

## 2020-10-17 MED ORDER — ACETAMINOPHEN 10 MG/ML IV SOLN: 1000.0000 mg | Freq: Once | INTRAVENOUS | Status: DC | PRN

## 2020-10-17 MED ORDER — ACETAMINOPHEN 10 MG/ML IV SOLN
INTRAVENOUS | Status: AC
  Administered 2020-10-17: 1000 mg via INTRAVENOUS
  Filled 2020-10-17: qty 100

## 2020-10-17 MED ORDER — PROPOFOL 10 MG/ML IV BOLUS
INTRAVENOUS | Status: AC
  Filled 2020-10-17: qty 20

## 2020-10-17 MED ORDER — LIDOCAINE 2% (20 MG/ML) 5 ML SYRINGE
INTRAMUSCULAR | Status: DC | PRN
  Administered 2020-10-17: 100 mg via INTRAVENOUS

## 2020-10-17 MED ORDER — SODIUM CHLORIDE 0.9% FLUSH
3.0000 mL | Freq: Two times a day (BID) | INTRAVENOUS | Status: DC
  Administered 2020-10-17 (×2): 3 mL via INTRAVENOUS

## 2020-10-17 MED ORDER — DEXAMETHASONE SODIUM PHOSPHATE 10 MG/ML IJ SOLN
INTRAMUSCULAR | Status: AC
  Filled 2020-10-17: qty 1

## 2020-10-17 MED ORDER — MIDAZOLAM HCL 2 MG/2ML IJ SOLN
INTRAMUSCULAR | Status: AC
  Filled 2020-10-17: qty 2

## 2020-10-17 MED ORDER — MIDAZOLAM HCL 2 MG/2ML IJ SOLN
INTRAMUSCULAR | Status: DC | PRN
  Administered 2020-10-17: 2 mg via INTRAVENOUS

## 2020-10-17 MED ORDER — HYDROMORPHONE HCL 1 MG/ML IJ SOLN
0.5000 mg | INTRAMUSCULAR | Status: DC | PRN
  Administered 2020-10-17: 0.5 mg via INTRAVENOUS
  Filled 2020-10-17: qty 0.5

## 2020-10-17 MED ORDER — AMLODIPINE BESYLATE 5 MG PO TABS: 2.5000 mg | ORAL_TABLET | Freq: Every day | ORAL | Status: DC | PRN

## 2020-10-17 MED ORDER — ADULT MULTIVITAMIN W/MINERALS CH: 1.0000 | ORAL_TABLET | Freq: Every day | ORAL | Status: DC

## 2020-10-17 MED ORDER — DOCUSATE SODIUM 100 MG PO CAPS
100.0000 mg | ORAL_CAPSULE | Freq: Two times a day (BID) | ORAL | Status: DC
  Administered 2020-10-17 – 2020-10-18 (×2): 100 mg via ORAL
  Filled 2020-10-17 (×2): qty 1

## 2020-10-17 MED ORDER — METHOCARBAMOL 500 MG PO TABS
500.0000 mg | ORAL_TABLET | Freq: Four times a day (QID) | ORAL | Status: DC | PRN
  Administered 2020-10-17 – 2020-10-18 (×4): 500 mg via ORAL
  Filled 2020-10-17 (×3): qty 1

## 2020-10-17 MED ORDER — OXYCODONE HCL 5 MG PO TABS
ORAL_TABLET | ORAL | Status: AC
  Filled 2020-10-17: qty 1

## 2020-10-17 MED ORDER — ONDANSETRON HCL 4 MG/2ML IJ SOLN: 4.0000 mg | Freq: Once | INTRAMUSCULAR | Status: DC | PRN

## 2020-10-17 MED ORDER — DEXAMETHASONE SODIUM PHOSPHATE 10 MG/ML IJ SOLN
INTRAMUSCULAR | Status: DC | PRN
  Administered 2020-10-17: 10 mg via INTRAVENOUS

## 2020-10-17 MED ORDER — PANTOPRAZOLE SODIUM 40 MG PO TBEC
40.0000 mg | DELAYED_RELEASE_TABLET | Freq: Every day | ORAL | Status: DC
  Administered 2020-10-18: 40 mg via ORAL
  Filled 2020-10-17: qty 1

## 2020-10-17 MED ORDER — PROSIGHT PO TABS
1.0000 | ORAL_TABLET | Freq: Two times a day (BID) | ORAL | Status: DC
  Administered 2020-10-18: 1 via ORAL
  Filled 2020-10-17 (×2): qty 1

## 2020-10-17 MED ORDER — PHENYLEPHRINE 40 MCG/ML (10ML) SYRINGE FOR IV PUSH (FOR BLOOD PRESSURE SUPPORT)
PREFILLED_SYRINGE | INTRAVENOUS | Status: AC
  Filled 2020-10-17: qty 10

## 2020-10-17 MED ORDER — CEFAZOLIN SODIUM-DEXTROSE 2-4 GM/100ML-% IV SOLN
2.0000 g | Freq: Three times a day (TID) | INTRAVENOUS | Status: AC
  Administered 2020-10-17 – 2020-10-18 (×2): 2 g via INTRAVENOUS
  Filled 2020-10-17 (×2): qty 100

## 2020-10-17 MED ORDER — HYDROMORPHONE HCL 1 MG/ML IJ SOLN
0.2500 mg | INTRAMUSCULAR | Status: DC | PRN
  Administered 2020-10-17 (×2): 0.5 mg via INTRAVENOUS

## 2020-10-17 MED ORDER — THROMBIN 5000 UNITS EX SOLR
CUTANEOUS | Status: AC
  Filled 2020-10-17: qty 5000

## 2020-10-17 MED ORDER — PHENOL 1.4 % MT LIQD: 1.0000 | OROMUCOSAL | Status: DC | PRN

## 2020-10-17 MED ORDER — PROPOFOL 10 MG/ML IV BOLUS
INTRAVENOUS | Status: DC | PRN
  Administered 2020-10-17: 150 mg via INTRAVENOUS

## 2020-10-17 MED ORDER — LACTATED RINGERS IV SOLN: INTRAVENOUS | Status: DC

## 2020-10-17 MED ORDER — BUPIVACAINE HCL (PF) 0.5 % IJ SOLN
INTRAMUSCULAR | Status: DC | PRN
  Administered 2020-10-17: 5 mL

## 2020-10-17 MED ORDER — VITAMIN D3 25 MCG (1000 UNIT) PO TABS
2000.0000 [IU] | ORAL_TABLET | Freq: Every day | ORAL | Status: DC
  Administered 2020-10-18: 2000 [IU] via ORAL
  Filled 2020-10-17: qty 2

## 2020-10-17 MED ORDER — ZOLPIDEM TARTRATE 5 MG PO TABS: 5.0000 mg | ORAL_TABLET | Freq: Every evening | ORAL | Status: DC | PRN

## 2020-10-17 MED ORDER — ONDANSETRON HCL 4 MG/2ML IJ SOLN
INTRAMUSCULAR | Status: AC
  Filled 2020-10-17: qty 2

## 2020-10-17 MED ORDER — METHOCARBAMOL 1000 MG/10ML IJ SOLN
500.0000 mg | Freq: Four times a day (QID) | INTRAVENOUS | Status: DC | PRN
  Filled 2020-10-17: qty 5

## 2020-10-17 MED ORDER — CHLORHEXIDINE GLUCONATE 0.12 % MT SOLN: 15.0000 mL | Freq: Once | OROMUCOSAL | Status: AC

## 2020-10-17 MED ORDER — 0.9 % SODIUM CHLORIDE (POUR BTL) OPTIME
TOPICAL | Status: DC | PRN
  Administered 2020-10-17: 1000 mL

## 2020-10-17 MED ORDER — ACETAMINOPHEN 650 MG RE SUPP: 650.0000 mg | RECTAL | Status: DC | PRN

## 2020-10-17 MED ORDER — OXYCODONE HCL 5 MG/5ML PO SOLN: 5.0000 mg | Freq: Once | ORAL | Status: AC | PRN

## 2020-10-17 MED ORDER — KCL IN DEXTROSE-NACL 20-5-0.45 MEQ/L-%-% IV SOLN: INTRAVENOUS | Status: DC

## 2020-10-17 MED ORDER — ALUM & MAG HYDROXIDE-SIMETH 200-200-20 MG/5ML PO SUSP
30.0000 mL | Freq: Four times a day (QID) | ORAL | Status: DC | PRN
  Administered 2020-10-18: 30 mL via ORAL
  Filled 2020-10-17: qty 30

## 2020-10-17 MED ORDER — FENTANYL CITRATE (PF) 250 MCG/5ML IJ SOLN
INTRAMUSCULAR | Status: AC
  Filled 2020-10-17: qty 5

## 2020-10-17 MED ORDER — ONDANSETRON HCL 4 MG PO TABS
4.0000 mg | ORAL_TABLET | Freq: Four times a day (QID) | ORAL | Status: DC | PRN
  Administered 2020-10-18: 4 mg via ORAL
  Filled 2020-10-17: qty 1

## 2020-10-17 SURGICAL SUPPLY — 63 items
BAND RUBBER #18 3X1/16 STRL (MISCELLANEOUS) ×4 IMPLANT
BASKET BONE COLLECTION (BASKET) ×2 IMPLANT
BIT DRILL NEURO 2X3.1 SFT TUCH (MISCELLANEOUS) ×1 IMPLANT
BLADE ULTRA TIP 2M (BLADE) IMPLANT
BNDG GAUZE ELAST 4 BULKY (GAUZE/BANDAGES/DRESSINGS) IMPLANT
BUR BARREL STRAIGHT FLUTE 4.0 (BURR) ×2 IMPLANT
CANISTER SUCT 3000ML PPV (MISCELLANEOUS) ×2 IMPLANT
CARTRIDGE OIL MAESTRO DRILL (MISCELLANEOUS) ×1 IMPLANT
COVER MAYO STAND STRL (DRAPES) ×2 IMPLANT
COVER WAND RF STERILE (DRAPES) IMPLANT
DECANTER SPIKE VIAL GLASS SM (MISCELLANEOUS) ×2 IMPLANT
DERMABOND ADVANCED (GAUZE/BANDAGES/DRESSINGS) ×1
DERMABOND ADVANCED .7 DNX12 (GAUZE/BANDAGES/DRESSINGS) ×1 IMPLANT
DIFFUSER DRILL AIR PNEUMATIC (MISCELLANEOUS) ×2 IMPLANT
DRAIN JACKSON PRATT 10MM FLAT (MISCELLANEOUS) ×2 IMPLANT
DRAPE HALF SHEET 40X57 (DRAPES) IMPLANT
DRAPE LAPAROTOMY 100X72 PEDS (DRAPES) ×2 IMPLANT
DRAPE MICROSCOPE LEICA (MISCELLANEOUS) ×2 IMPLANT
DRILL NEURO 2X3.1 SOFT TOUCH (MISCELLANEOUS) ×2
DRSG OPSITE POSTOP 3X4 (GAUZE/BANDAGES/DRESSINGS) ×2 IMPLANT
DURAPREP 6ML APPLICATOR 50/CS (WOUND CARE) ×2 IMPLANT
ELECT COATED BLADE 2.86 ST (ELECTRODE) ×2 IMPLANT
ELECT REM PT RETURN 9FT ADLT (ELECTROSURGICAL) ×2
ELECTRODE REM PT RTRN 9FT ADLT (ELECTROSURGICAL) ×1 IMPLANT
EVACUATOR SILICONE 100CC (DRAIN) ×2 IMPLANT
GAUZE 4X4 16PLY RFD (DISPOSABLE) IMPLANT
GAUZE SPONGE 4X4 12PLY STRL (GAUZE/BANDAGES/DRESSINGS) IMPLANT
GLOVE BIO SURGEON STRL SZ8 (GLOVE) ×4 IMPLANT
GLOVE BIOGEL PI IND STRL 8.5 (GLOVE) ×2 IMPLANT
GLOVE BIOGEL PI INDICATOR 8.5 (GLOVE) ×2
GLOVE ECLIPSE 8.0 STRL XLNG CF (GLOVE) ×4 IMPLANT
GLOVE EXAM NITRILE XL STR (GLOVE) IMPLANT
GLOVE SRG 8 PF TXTR STRL LF DI (GLOVE) ×3 IMPLANT
GLOVE SURG UNDER POLY LF SZ7.5 (GLOVE) ×2 IMPLANT
GLOVE SURG UNDER POLY LF SZ8 (GLOVE) ×6
GOWN STRL REUS W/ TWL LRG LVL3 (GOWN DISPOSABLE) IMPLANT
GOWN STRL REUS W/ TWL XL LVL3 (GOWN DISPOSABLE) ×3 IMPLANT
GOWN STRL REUS W/TWL 2XL LVL3 (GOWN DISPOSABLE) ×4 IMPLANT
GOWN STRL REUS W/TWL LRG LVL3 (GOWN DISPOSABLE)
GOWN STRL REUS W/TWL XL LVL3 (GOWN DISPOSABLE) ×6
HALTER HD/CHIN CERV TRACTION D (MISCELLANEOUS) ×2 IMPLANT
HEMOSTAT POWDER KIT SURGIFOAM (HEMOSTASIS) ×2 IMPLANT
HEMOSTAT SURGICEL 2X14 (HEMOSTASIS) IMPLANT
KIT BASIN OR (CUSTOM PROCEDURE TRAY) ×2 IMPLANT
KIT TURNOVER KIT B (KITS) ×2 IMPLANT
NEEDLE HYPO 25X1 1.5 SAFETY (NEEDLE) ×2 IMPLANT
NEEDLE SPNL 22GX3.5 QUINCKE BK (NEEDLE) ×2 IMPLANT
NS IRRIG 1000ML POUR BTL (IV SOLUTION) ×2 IMPLANT
OIL CARTRIDGE MAESTRO DRILL (MISCELLANEOUS) ×2
PACK LAMINECTOMY NEURO (CUSTOM PROCEDURE TRAY) ×2 IMPLANT
PAD ARMBOARD 7.5X6 YLW CONV (MISCELLANEOUS) ×2 IMPLANT
PEEK SPACER AVS AS 6X14X16 4D (Cage) ×2 IMPLANT
PIN DISTRACTION 14MM (PIN) ×4 IMPLANT
PLATE ANT CONST 26 1LVL (Plate) ×2 IMPLANT
SCREW VA ST OZARK 4.5X14 (Screw) ×4 IMPLANT
SCREW VA ST OZARK 4X14 (Screw) ×4 IMPLANT
SPONGE INTESTINAL PEANUT (DISPOSABLE) ×2 IMPLANT
SPONGE SURGIFOAM ABS GEL SZ50 (HEMOSTASIS) IMPLANT
STAPLER SKIN PROX WIDE 3.9 (STAPLE) IMPLANT
SUT VIC AB 3-0 SH 8-18 (SUTURE) ×4 IMPLANT
TOWEL GREEN STERILE (TOWEL DISPOSABLE) ×2 IMPLANT
TOWEL GREEN STERILE FF (TOWEL DISPOSABLE) ×2 IMPLANT
WATER STERILE IRR 1000ML POUR (IV SOLUTION) ×2 IMPLANT

## 2020-10-17 NOTE — Brief Op Note (Signed)
10/17/2020  12:47 PM  PATIENT:  Erik [name]  65 y.o. adult  PRE-OPERATIVE DIAGNOSIS:  Cervical spondylosis, cervical stenosis, cervical disc herniation, cervical radiculopathy, cervicalgia C 34  POST-OPERATIVE DIAGNOSIS:  Cervical spondylosis, cervical stenosis, cervical disc herniation, cervical radiculopathy, cervicalgia C 34  PROCEDURE:  Procedure(s): Right side access, Cervical three-four  Anterior cervical decompression/discectomy/fusion with exploration of fusion and removal of plate from cervical four to cervical six (N/A)   SURGEON:  Surgeon(s) and Role:    Erline Levine, MD - Primary  PHYSICIAN ASSISTANT: Glenford Peers, NP  ASSISTANTS: Poteat, RN   Mabe, MS 3   ANESTHESIA:   general  EBL:  25 mL   BLOOD ADMINISTERED:none  DRAINS: (10) Jackson-Pratt drain(s) with closed bulb suction in the prevertebral space   LOCAL MEDICATIONS USED:  MARCAINE    and LIDOCAINE   SPECIMEN:  No Specimen  DISPOSITION OF SPECIMEN:  N/A  COUNTS:  YES  TOURNIQUET:  * No tourniquets in log *  DICTATION: Patient is 65 year old male with stenosis, spondylosis, herniated cervical disc, radiculopathy of C3 on 4 with stenosis, myelopathy, cord compression, HNP.  It was elected to take him to surgery for exploration of fusion C 4 - C 6 with  anterior cervical decompression and fusion C 34 level.  PROCEDURE: Patient was brought to operating room and following the smooth and uncomplicated induction of general endotracheal anesthesia his head was placed on a horseshoe head holder he was placed in 5 pounds of Holter traction and his anterior neck was prepped and draped in usual sterile fashion. An incision was made on the right side of midline after infiltrating the skin and subcutaneous tissues with local lidocaine. The platysmal layer was incised and subplatysmal dissection was performed exposing the anterior border sternocleidomastoid muscle. Using blunt dissection the carotid sheath was kept  lateral and trachea and esophagus kept medial exposing the anterior cervical spine. The previous plate was exposed and removed without difficulty.  The bony interfaces C 4 - C 6 were explored and there appeared to be a solid arthrodesis at the previously operated levels.  The C 34 level was exposed.  A bent spinal needle was placed it was felt to be the C 34 level and this was confirmed on intraoperative x-ray. Longus coli muscles were taken down from the anterior cervical spine using electrocautery and key elevator and self-retaining retractor was placed exposing the C 34 level. The interspace was incised and a thorough discectomy was performed. Distraction pins were placed. Uncinate spurs and central spondylitic ridges were drilled down with a high-speed drill. The spinal cord dura and both C4 nerve roots were widely decompressed. Hemostasis was assured. After trial sizing a large footprint lordotic 6 mm PEEK cage was selected and packed with local autograft. This was tamped into position and countersunk appropriately. Distraction weight was removed. A 26 mm [place] anterior cervical plate was affixed to the cervical spine with 14 mm x 4 mm variable-angle screws 2 at C3, 2 at C 4 (14 mm x 4.5 mm). All screws were well-positioned and locking mechanisms were engaged. A final X ray was obtained which showed well positioned graft and anterior plate without complicating features. Soft tissues were inspected and found to be in good repair. The wound was irrigated. A # 10 JP drain was inserted through a separate stab incision.  The platysma layer was closed with 3-0 Vicryl stitches and the skin was reapproximated with 3-0 Vicryl subcuticular stitches. The wound was dressed with  Dermabond and an occlusive dressing. Counts were correct at the end of the case. Patient was extubated and taken to recovery in stable and satisfactory condition.   PLAN OF CARE: Admit to inpatient   PATIENT DISPOSITION:  PACU - hemodynamically  stable.   Delay start of Pharmacological VTE agent (>24hrs) due to surgical blood loss or risk of bleeding: yes

## 2020-10-17 NOTE — Progress Notes (Signed)
Awake, alert, conversant.  Full strength bilateral B/T/HI.  MAEW.  Sore in back of neck.  Doing well.

## 2020-10-17 NOTE — Anesthesia Preprocedure Evaluation (Signed)
Anesthesia Evaluation  Patient identified by MRN, date of birth, ID band Patient awake    Reviewed: Allergy & Precautions, NPO status , Patient's Chart, lab work & pertinent test results  History of Anesthesia Complications (+) PONV  Airway Mallampati: II  TM Distance: >3 FB Neck ROM: Full    Dental no notable dental hx.    Pulmonary neg pulmonary ROS,    Pulmonary exam normal breath sounds clear to auscultation       Cardiovascular negative cardio ROS Normal cardiovascular exam Rhythm:Regular Rate:Normal     Neuro/Psych negative neurological ROS  negative psych ROS   GI/Hepatic Neg liver ROS, GERD  Medicated,  Endo/Other  negative endocrine ROS  Renal/GU negative Renal ROS  negative genitourinary   Musculoskeletal  (+) Arthritis , Osteoarthritis,    Abdominal   Peds negative pediatric ROS (+)  Hematology negative hematology ROS (+)   Anesthesia Other Findings   Reproductive/Obstetrics negative OB ROS                             Anesthesia Physical Anesthesia Plan  ASA: II  Anesthesia Plan: General   Post-op Pain Management:    Induction: Intravenous  PONV Risk Score and Plan: 3 and Ondansetron, Dexamethasone, Midazolam and Treatment may vary due to age or medical condition  Airway Management Planned: Oral ETT  Additional Equipment:   Intra-op Plan:   Post-operative Plan: Extubation in OR  Informed Consent: I have reviewed the patients History and Physical, chart, labs and discussed the procedure including the risks, benefits and alternatives for the proposed anesthesia with the patient or authorized representative who has indicated his/her understanding and acceptance.     Dental advisory given  Plan Discussed with: CRNA and Surgeon  Anesthesia Plan Comments:         Anesthesia Quick Evaluation

## 2020-10-17 NOTE — Interval H&P Note (Signed)
History and Physical Interval Note:  10/17/2020 10:20 AM  Erik [name]  has presented today for surgery, with the diagnosis of Cervical spondylosis.  The various methods of treatment have been discussed with the patient and family. After consideration of risks, benefits and other options for treatment, the patient has consented to  Procedure(s): Right side access, Cervical 3-4 Anterior cervical decompression/discectomy/fusion with exploration and removal of plate from cervical 4 to cervical 6 (N/A) as a surgical intervention.  The patient's history has been reviewed, patient examined, no change in status, stable for surgery.  I have reviewed the patient's chart and labs.  Questions were answered to the patient's satisfaction.     [name]

## 2020-10-17 NOTE — Anesthesia Postprocedure Evaluation (Signed)
Anesthesia Post Note  Patient: Erik [name]  Procedure(s) Performed: Right side access, Cervical three-four  Anterior cervical decompression/discectomy/fusion with exploration and removal of plate from cervical four to cervical six (N/A Neck)     Patient location during evaluation: PACU Anesthesia Type: General Level of consciousness: awake and alert Pain management: pain level controlled Vital Signs Assessment: post-procedure vital signs reviewed and stable Respiratory status: spontaneous breathing, nonlabored ventilation, respiratory function stable and patient connected to nasal cannula oxygen Cardiovascular status: blood pressure returned to baseline and stable Postop Assessment: no apparent nausea or vomiting Anesthetic complications: no   No complications documented.  Last Vitals:  Vitals:   10/17/20 1320 10/17/20 1330  BP: 124/74 119/74  Pulse: 62 64  Resp: 16 14  Temp:  36.7 C  SpO2: 100% 96%    Last Pain:  Vitals:   10/17/20 1330  TempSrc:   PainSc: Asleep                 [name] S

## 2020-10-17 NOTE — Op Note (Signed)
10/17/2020  12:47 PM  PATIENT:  Erik [name]  65 y.o. adult  PRE-OPERATIVE DIAGNOSIS:  Cervical spondylosis, cervical stenosis, cervical disc herniation, cervical radiculopathy, cervicalgia C 34  POST-OPERATIVE DIAGNOSIS:  Cervical spondylosis, cervical stenosis, cervical disc herniation, cervical radiculopathy, cervicalgia C 34  PROCEDURE:  Procedure(s): Right side access, Cervical three-four  Anterior cervical decompression/discectomy/fusion with exploration of fusion and removal of plate from cervical four to cervical six (N/A)   SURGEON:  Surgeon(s) and Role:    Erline Levine, MD - Primary  PHYSICIAN ASSISTANT: Glenford Peers, NP  ASSISTANTS: Poteat, RN   Mabe, MS 3   ANESTHESIA:   general  EBL:  25 mL   BLOOD ADMINISTERED:none  DRAINS: (10) Jackson-Pratt drain(s) with closed bulb suction in the prevertebral space   LOCAL MEDICATIONS USED:  MARCAINE    and LIDOCAINE   SPECIMEN:  No Specimen  DISPOSITION OF SPECIMEN:  N/A  COUNTS:  YES  TOURNIQUET:  * No tourniquets in log *  DICTATION: Patient is 65 year old male with stenosis, spondylosis, herniated cervical disc, radiculopathy of C3 on 4 with stenosis, myelopathy, cord compression, HNP.  It was elected to take him to surgery for exploration of fusion C 4 - C 6 with  anterior cervical decompression and fusion C 34 level.  PROCEDURE: Patient was brought to operating room and following the smooth and uncomplicated induction of general endotracheal anesthesia his head was placed on a horseshoe head holder he was placed in 5 pounds of Holter traction and his anterior neck was prepped and draped in usual sterile fashion. An incision was made on the right side of midline after infiltrating the skin and subcutaneous tissues with local lidocaine. The platysmal layer was incised and subplatysmal dissection was performed exposing the anterior border sternocleidomastoid muscle. Using blunt dissection the carotid sheath was kept  lateral and trachea and esophagus kept medial exposing the anterior cervical spine. The previous plate was exposed and removed without difficulty.  The bony interfaces C 4 - C 6 were explored and there appeared to be a solid arthrodesis at the previously operated levels.  The C 34 level was exposed.  A bent spinal needle was placed it was felt to be the C 34 level and this was confirmed on intraoperative x-ray. Longus coli muscles were taken down from the anterior cervical spine using electrocautery and key elevator and self-retaining retractor was placed exposing the C 34 level. The interspace was incised and a thorough discectomy was performed. Distraction pins were placed. Uncinate spurs and central spondylitic ridges were drilled down with a high-speed drill. The spinal cord dura and both C4 nerve roots were widely decompressed. Hemostasis was assured. After trial sizing a large footprint lordotic 6 mm PEEK cage was selected and packed with local autograft. This was tamped into position and countersunk appropriately. Distraction weight was removed. A 26 mm [place] anterior cervical plate was affixed to the cervical spine with 14 mm x 4 mm variable-angle screws 2 at C3, 2 at C 4 (14 mm x 4.5 mm). All screws were well-positioned and locking mechanisms were engaged. A final X ray was obtained which showed well positioned graft and anterior plate without complicating features. Soft tissues were inspected and found to be in good repair. The wound was irrigated. A # 10 JP drain was inserted through a separate stab incision.  The platysma layer was closed with 3-0 Vicryl stitches and the skin was reapproximated with 3-0 Vicryl subcuticular stitches. The wound was dressed with  Dermabond and an occlusive dressing. Counts were correct at the end of the case. Patient was extubated and taken to recovery in stable and satisfactory condition.   PLAN OF CARE: Admit to inpatient   PATIENT DISPOSITION:  PACU - hemodynamically  stable.   Delay start of Pharmacological VTE agent (>24hrs) due to surgical blood loss or risk of bleeding: yes

## 2020-10-17 NOTE — Transfer of Care (Signed)
Immediate Anesthesia Transfer of Care Note  Patient: Erik [name]  Procedure(s) Performed: Right side access, Cervical three-four  Anterior cervical decompression/discectomy/fusion with exploration and removal of plate from cervical four to cervical six (N/A Neck)  Patient Location: PACU  Anesthesia Type:General  Level of Consciousness: drowsy, patient cooperative and responds to stimulation  Airway & Oxygen Therapy: Patient Spontanous Breathing  Post-op Assessment: Report given to RN and Post -op Vital signs reviewed and stable  Post vital signs: Reviewed and stable  Last Vitals:  Vitals Value Taken Time  BP 120/91 10/17/20 1236  Temp    Pulse 78 10/17/20 1239  Resp 19 10/17/20 1239  SpO2 99 % 10/17/20 1239  Vitals shown include unvalidated device data.  Last Pain:  Vitals:   10/17/20 0950  TempSrc:   PainSc: 3          Complications: No complications documented.

## 2020-10-17 NOTE — Anesthesia Procedure Notes (Addendum)
Procedure Name: Intubation Date/Time: 10/17/2020 10:55 AM Performed by: Janace Litten, CRNA Pre-anesthesia Checklist: Patient identified, Emergency Drugs available, Suction available and Patient being monitored Patient Re-evaluated:Patient Re-evaluated prior to induction Oxygen Delivery Method: Circle System Utilized Preoxygenation: Pre-oxygenation with 100% oxygen Induction Type: IV induction Ventilation: Oral airway inserted - appropriate to patient size and Mask ventilation without difficulty Laryngoscope Size: Glidescope and 4 Grade View: Grade I Tube type: Oral Tube size: 7.5 mm Number of attempts: 1 Airway Equipment and Method: Stylet and Oral airway Placement Confirmation: ETT inserted through vocal cords under direct vision,  positive ETCO2 and breath sounds checked- equal and bilateral Secured at: 22 cm Tube secured with: Tape Dental Injury: Teeth and Oropharynx as per pre-operative assessment  Comments: 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

## 2020-10-18 ENCOUNTER — Other Ambulatory Visit (HOSPITAL_BASED_OUTPATIENT_CLINIC_OR_DEPARTMENT_OTHER): Payer: Self-pay

## 2020-10-18 MED ORDER — HYDROCODONE-ACETAMINOPHEN 5-325 MG PO TABS
1.0000 | ORAL_TABLET | ORAL | 0 refills | Status: DC | PRN
  Filled 2020-10-18: qty 40, 5d supply, fill #0

## 2020-10-18 MED ORDER — METHOCARBAMOL 500 MG PO TABS
500.0000 mg | ORAL_TABLET | Freq: Four times a day (QID) | ORAL | 2 refills | Status: DC | PRN
  Filled 2020-10-18: qty 30, 8d supply, fill #0

## 2020-10-18 MED ORDER — OXYCODONE HCL 5 MG PO TABS
5.0000 mg | ORAL_TABLET | ORAL | 0 refills | Status: DC | PRN
  Filled 2020-10-18: qty 30, 5d supply, fill #0

## 2020-10-18 NOTE — Discharge Summary (Addendum)
Physician Discharge Summary  Patient ID: Erik [name] MRN: [id number] DOB/AGE: 1955/08/21 65 y.o.  Admit date: 10/17/2020 Discharge date: 10/18/2020  Admission Diagnoses: Cervical stenosis with radiculopathy  Discharge Diagnoses: Cervical stenosis with radiculopathy Active Problems:   Cervical stenosis of spinal canal   Discharged Condition: good  Hospital Course: Patient underwent anterior cervical decompression arthrodesis and tolerated it well  Consults: None  Significant Diagnostic Studies: None  Treatments: surgery: See op note  Discharge Exam: Blood pressure 121/77, pulse 78, temperature 98.2 F (36.8 C), temperature source Oral, resp. rate 17, height 6' (1.829 m), weight 92.5 kg, SpO2 95 %. Incision is clean and dry Station and gait is intact upper extremity strength is normal  Disposition: Discharge disposition: 01-Home or Self Care       Discharge Instructions    Call MD for:  redness, tenderness, or signs of infection (pain, swelling, redness, odor or green/yellow discharge around incision site)   Complete by: As directed    Call MD for:  severe uncontrolled pain   Complete by: As directed    Call MD for:  temperature >100.4   Complete by: As directed    Diet - low sodium heart healthy   Complete by: As directed    Discharge wound care:   Complete by: As directed    Okay to shower. Do not apply salves or appointments to incision. No heavy lifting with the upper extremities greater than 10 pounds. May resume driving when not requiring pain medication and patient feels comfortable with doing so.   Increase activity slowly   Complete by: As directed      Allergies as of 10/18/2020      Reactions   Sulfa Drugs Cross Reactors Nausea And Vomiting      Medication List    TAKE these medications   amLODipine 2.5 MG tablet Commonly known as: Norvasc Take 1 tablet (2.5 mg total) by mouth daily. What changed:   when to take this  reasons to take  this   B-D INTEGRA SYRINGE 23G X 1" 3 ML Misc Generic drug: SYRINGE-NEEDLE (DISP) 3 ML   Fluad Quadrivalent 0.5 ML injection Generic drug: influenza vaccine adjuvanted INJECT AS DIRECTED   HYDROcodone-acetaminophen 5-325 MG tablet Commonly known as: NORCO/VICODIN TAKE 1 TABLET BY MOUTH EVERY 6 HOURS AS NEEDED FOR PAIN What changed:   when to take this  additional instructions  Another medication with the same name was removed. Continue taking this medication, and follow the directions you see here.   hyoscyamine 0.125 MG SL tablet Commonly known as: LEVSIN SL Place 1 tablet (0.125 mg total) under the tongue every 4 (four) hours as needed. Take 1 tab every 4 to 6 hours, under the tongue as needed for abdominal spasms   Lidocaine-Hydrocortisone Ace 1-1 % Crea APPLY 1 APPLICATION ON TO THE SKIN 2 TIMES DAILY AS NEEDED FOR INTERNAL HERORRHOIDS   methocarbamol 500 MG tablet Commonly known as: ROBAXIN Take 1 tablet (500 mg total) by mouth every 6 (six) hours as needed for muscle spasms.   Multi-Vitamins Tabs Take 1 tablet by mouth daily.   omeprazole 40 MG capsule Commonly known as: PRILOSEC TAKE 1 CAPSULE BY MOUTH 1 TO 2 TIMES A DAY FOR HEARTBURN AND REFLUX What changed:   how much to take  how to take this  when to take this   oxyCODONE 5 MG immediate release tablet Commonly known as: Oxy IR/ROXICODONE Take 1-2 tablets (5-10 mg total) by mouth every 3 (three) hours  as needed for moderate pain or severe pain.   PreserVision AREDS 2 Caps Take 1 capsule by mouth 2 (two) times daily.   Proctofoam HC rectal foam Generic drug: hydrocortisone-pramoxine PLACE 1 APPLICATOR RECTALLY 2 TIMES DAILY   Proctozone-HC 2.5 % rectal cream Generic drug: hydrocortisone Place 1 application rectally 2 (two) times daily.   sildenafil 20 MG tablet Commonly known as: REVATIO Take 20 mg by mouth daily as needed (ED).   SYSTANE PRESERVATIVE FREE OP Place 1 drop into both eyes daily  as needed (Dry eye).   tadalafil 5 MG tablet Commonly known as: CIALIS Take 5 mg by mouth daily.   testosterone cypionate 200 MG/ML injection Commonly known as: DEPOTESTOSTERONE CYPIONATE INJECT 1 ML (200 MG TOTAL) INTO THE MUSCLE EVERY 14 DAYS FOR 30 DAYS. What changed:   how much to take  how to take this  when to take this   tiZANidine 4 MG tablet Commonly known as: ZANAFLEX TAKE 1 TABLET (4 MG TOTAL) BY MOUTH EVERY 6 (SIX) HOURS AS NEEDED FOR MUSCLE SPASMS. What changed:   how much to take  when to take this   tiZANidine 4 MG tablet Commonly known as: ZANAFLEX TAKE 1 TABLET BY MOUTH UP TO 3 TIMES A DAY AS NEEDED What changed: Another medication with the same name was changed. Make sure you understand how and when to take each.   Vitamin D3 50 MCG (2000 UT) Tabs Take 2,000 Units by mouth daily at 2 am.            Discharge Care Instructions  (From admission, onward)         Start     Ordered   10/18/20 0000  Discharge wound care:       Comments: Okay to shower. Do not apply salves or appointments to incision. No heavy lifting with the upper extremities greater than 10 pounds. May resume driving when not requiring pain medication and patient feels comfortable with doing so.   10/18/20 1125          Follow-up Information    Erline Levine, MD Follow up.   Specialty: Neurosurgery Contact information: [address] [place] [address] [phone]               Signed: Earleen Newport 10/18/2020, 11:42 AM

## 2020-10-18 NOTE — Progress Notes (Signed)
Occupational Therapy Evaluation  Completed education regarding ADL and functional mobility for ADL while adhering to cervical precautions. Pt/wife verbalized/demonstrated understanding.    10/18/20 1100  OT Visit Information  Last OT Received On 10/18/20  Assistance Needed +1  History of Present Illness Patient is a 65 y/o male who presents s/p C3-4 ACDF with removal of plate from R9-1 6/38/46. PMH includes Covid-19 2020 and Raynaud's disease.  Precautions  Precautions Cervical  Precaution Booklet Issued Yes (comment)  Precaution Comments Reviewed handout and precautions  Required Braces or Orthoses Cervical Brace  Cervical Brace Soft collar (orders states can remove when in bed and when sitting; can remove for showers)  Home Living  Family/patient expects to be discharged to: Private residence  Living Arrangements Spouse/significant other  Available Help at Discharge Family;Available PRN/intermittently  Type of Home House  Home Access Stairs to enter  Entrance Stairs-Number of Steps 1 small step  Home Layout Two level;Bed/bath upstairs  Alternate Level Stairs-Number of Steps 1 flight  Alternate Level Stairs-Rails Right  Bathroom Shower/Tub Walk-in shower  Bathroom Toilet Handicapped height  Bathroom Accessibility Yes  How Accessible Accessible via walker  [place];Shower seat - built in  Prior Function  Level of Independence Independent  Comments Does own ADLs/IADLs, drives, works as a Forensic psychologist.  Communication  Communication No difficulties  Pain Assessment  Pain Assessment 0-10  Pain Score 4  Pain Location throat  Pain Descriptors / Indicators Sore;Operative site guarding  Pain Intervention(s) Limited activity within patient's tolerance  Cognition  Arousal/Alertness Awake/alert  Behavior During Therapy WFL for tasks assessed/performed  Overall Cognitive Status Within Functional Limits for tasks assessed  Upper Extremity Assessment  Upper  Extremity Assessment Overall WFL for tasks assessed (reports pain is no longer present)  Lower Extremity Assessment  Lower Extremity Assessment Defer to PT evaluation  Cervical / Trunk Assessment  Cervical / Trunk Assessment Other exceptions  Cervical / Trunk Exceptions s/p neck surgery  ADL  Overall ADL's  Needs assistance/impaired  Functional mobility during ADLs Modified independent  General ADL Comments Educated on compensatory strategies for ADL foloowing back precautions. Pt ableto achieve figure four position for LB ADL. Pt dressed during session to incorporate correct strateiges. Educated on home set up to maximize funcitonal level fo independence while adhering to cervical precautions. Recommend reacher to retuieve items from floor when home alone as wife will be returning to work.  Pt able to donn/doff soft collar and verbalized understanding of wearing times  Vision- History  Baseline Vision/History Wears glasses  Bed Mobility  Overal bed mobility Modified Independent  General bed mobility comments plans to sleep in recliner (electric)  Transfers  Overall transfer level Modified independent  Balance  Overall balance assessment No apparent balance deficits (not formally assessed)  General Comments  General comments (skin integrity, edema, etc.) wife present and verbalzied understanding.  OT - End of Session  Equipment Utilized During Treatment Cervical collar  Activity Tolerance Patient tolerated treatment well  Patient left in bed;with call bell/phone within reach  Nurse Communication Other (comment) (DC needs)  OT Assessment  OT Recommendation/Assessment Patient does not need any further OT services  OT Visit Diagnosis Pain  Pain - part of body  (neck)  OT Problem List Decreased knowledge of use of DME or AE;Decreased knowledge of precautions;Pain  AM-PAC OT "6 Clicks" Daily Activity Outcome Measure (Version 2)  Help from another person eating meals? 4  Help from  another person taking care of personal grooming? 3  Help from another person toileting, which includes using toliet, bedpan, or urinal? 4  Help from another person bathing (including washing, rinsing, drying)? 3  Help from another person to put on and taking off regular upper body clothing? 3  Help from another person to put on and taking off regular lower body clothing? 3  6 Click Score 20  OT Recommendation  Follow Up Recommendations No OT follow up;Supervision - Intermittent  OT Equipment None recommended by OT  Acute Rehab OT Goals  Patient Stated Goal to go home and get back to fishing with my granddaughter  OT Goal Formulation All assessment and education complete, DC therapy  OT Time Calculation  OT Start Time (ACUTE ONLY) 0909  OT Stop Time (ACUTE ONLY) 0927  OT Time Calculation (min) 18 min  OT General Charges  $OT Visit 1 Visit  OT Evaluation  $OT Eval Low Complexity 1 Low  Written Expression  Dominant Hand Right  Maurie Boettcher, OT/L   Acute OT Clinical Specialist Acute Rehabilitation Services Pager [phone] Office [phone]

## 2020-10-18 NOTE — Progress Notes (Signed)
Patient alert and oriented, voiding adequately, MAE well with no difficulty. Incision area cdi with no s/s of infection. Patient discharged home per order. Patient and spouse stated understanding of discharge instructions given. Patient has an appointment with Dr. Vertell Limber.

## 2020-10-18 NOTE — Discharge Instructions (Signed)
Wound Care Remove outer dressing in 2-3 days Leave incision open to air. You may shower. Do not scrub directly on incision.  Do not put any creams, lotions, or ointments on incision. Activity Walk each and every day, increasing distance each day. No lifting greater than 5 lbs.  Avoid excessive neck motion. No driving for 2 weeks; may ride as a passenger locally. Wear neck brace at all times except when showering.  If provided soft collar, may wear for comfort unless otherwise instructed. Diet Resume your normal diet.   Call Your Doctor If Any of These Occur Redness, drainage, or swelling at the wound.  Temperature greater than 101 degrees. Severe pain not relieved by pain medication. Increased difficulty swallowing. Incision starts to come apart. Follow Up Appt Call today for appointment in 2 weeks ([phone]) or for problems.  If you have any hardware placed in your spine, you will need an x-ray before your appointment.

## 2020-10-18 NOTE — Evaluation (Signed)
Physical Therapy Evaluation Patient Details Name: Erik [name] MRN: [id number] DOB: 06-May-1956 Today's Date: 10/18/2020   History of Present Illness  Patient is a 65 y/o male who presents s/p C3-4 ACDF with removal of plate from O3-5 0/09/38. PMH includes Covid-19 2020 and Raynaud's disease.  Clinical Impression  Patient presents with pain and post surgical deficits s/p above surgery. Pt reports independence with ADLs/IADLs and driving PTA, working as a Forensic psychologist. Today, pt tolerated bed mobility, transfers and gait training Mod I for safety. Tolerated stair training without difficulties. Reports symptoms have resolved in LUE. Education re: neck precautions, handout, log roll technique, collar, positioning etc. Pt does not require skilled therapy services as pt functioning close to baseline at Mod I level. All education completed. Discharge from therapy.    Follow Up Recommendations No PT follow up    Equipment Recommendations  None recommended by PT    Recommendations for Other Services       Precautions / Restrictions Precautions Precautions: Cervical Precaution Booklet Issued: Yes (comment) Precaution Comments: Reviewed handout and precautions Required Braces or Orthoses: Cervical Brace Cervical Brace: Soft collar Restrictions Weight Bearing Restrictions: No      Mobility  Bed Mobility Overal bed mobility: Modified Independent             General bed mobility comments: Cues for log roll technique, with HOB flat and no use of rails to simulate home.    Transfers Overall transfer level: Modified independent Equipment used: None             General transfer comment: Stood from EOB without difficulty.  Ambulation/Gait Ambulation/Gait assistance: Modified independent (Device/Increase time) Gait Distance (Feet): 400 Feet Assistive device: None Gait Pattern/deviations: Step-through pattern;Decreased stride length   Gait velocity interpretation:  1.31 - 2.62 ft/sec, indicative of limited community ambulator General Gait Details: Slow, steady gait with no evidence of imbalance.  Stairs Stairs: Yes Stairs assistance: Modified independent (Device/Increase time) Stair Management: One rail Left Number of Stairs: 13 General stair comments: Cues for technique/safety.  Wheelchair Mobility    Modified Rankin (Stroke Patients Only)       Balance Overall balance assessment: No apparent balance deficits (not formally assessed)                                           Pertinent Vitals/Pain Pain Assessment: 0-10 Pain Score: 3  Pain Location: neck Pain Descriptors / Indicators: Sore;Operative site guarding Pain Intervention(s): Monitored during session    Home Living Family/patient expects to be discharged to:: Private residence Living Arrangements: Spouse/significant other Available Help at Discharge: Family;Available PRN/intermittently Type of Home: House Home Access: Stairs to enter   Entrance Stairs-Number of Steps: 1 small step Home Layout: Two level;Bed/bath upstairs Home Equipment: Crutches      Prior Function Level of Independence: Independent         Comments: Does own ADLs/IADLs, drives, works as a Forensic psychologist.     Hand Dominance   Dominant Hand: Right    Extremity/Trunk Assessment   Upper Extremity Assessment Upper Extremity Assessment: Defer to OT evaluation    Lower Extremity Assessment Lower Extremity Assessment: Overall WFL for tasks assessed    Cervical / Trunk Assessment Cervical / Trunk Assessment: Other exceptions Cervical / Trunk Exceptions: s/p neck surgery  Communication   Communication: No difficulties  Cognition Arousal/Alertness: Awake/alert Behavior During Therapy:  WFL for tasks assessed/performed Overall Cognitive Status: Within Functional Limits for tasks assessed                                        General Comments General  comments (skin integrity, edema, etc.): Wife present during session.    Exercises     Assessment/Plan    PT Assessment Patent does not need any further PT services  PT Problem List         PT Treatment Interventions      PT Goals (Current goals can be found in the Care Plan section)  Acute Rehab PT Goals Patient Stated Goal: to go home and get back to fishing with my granddaughter PT Goal Formulation: All assessment and education complete, DC therapy    Frequency     Barriers to discharge        Co-evaluation               AM-PAC PT "6 Clicks" Mobility  Outcome Measure Help needed turning from your back to your side while in a flat bed without using bedrails?: None Help needed moving from lying on your back to sitting on the side of a flat bed without using bedrails?: None Help needed moving to and from a bed to a chair (including a wheelchair)?: None Help needed standing up from a chair using your arms (e.g., wheelchair or bedside chair)?: None Help needed to walk in hospital room?: None Help needed climbing 3-5 steps with a railing? : None 6 Click Score: 24    End of Session Equipment Utilized During Treatment: Cervical collar Activity Tolerance: Patient tolerated treatment well Patient left: in bed;with call bell/phone within reach;with family/visitor present (sitting EOB) Nurse Communication: Mobility status PT Visit Diagnosis: Pain Pain - part of body:  (neck)    Time: 8264-1583 PT Time Calculation (min) (ACUTE ONLY): 18 min   Charges:   PT Evaluation $PT Eval Low Complexity: 1 Low          Marisa Severin, PT, DPT Acute Rehabilitation Services Pager [phone] Office [phone]      Marguarite Arbour A Sabra Heck 10/18/2020, 8:23 AM

## 2020-10-21 ENCOUNTER — Encounter (HOSPITAL_COMMUNITY): Payer: Self-pay | Admitting: Neurosurgery

## 2020-10-21 ENCOUNTER — Other Ambulatory Visit (HOSPITAL_BASED_OUTPATIENT_CLINIC_OR_DEPARTMENT_OTHER): Payer: Self-pay

## 2020-10-21 MED ORDER — ONDANSETRON HCL 4 MG PO TABS
ORAL_TABLET | ORAL | 0 refills | Status: AC
  Filled 2020-10-21: qty 30, 10d supply, fill #0

## 2020-10-23 ENCOUNTER — Encounter (HOSPITAL_COMMUNITY): Payer: Self-pay | Admitting: Neurosurgery

## 2020-10-31 ENCOUNTER — Encounter: Payer: Self-pay | Admitting: Gastroenterology

## 2020-10-31 DIAGNOSIS — M818 Other osteoporosis without current pathological fracture: Secondary | ICD-10-CM | POA: Diagnosis not present

## 2020-11-06 ENCOUNTER — Other Ambulatory Visit (HOSPITAL_BASED_OUTPATIENT_CLINIC_OR_DEPARTMENT_OTHER): Payer: Self-pay

## 2020-11-06 ENCOUNTER — Encounter: Payer: PPO | Admitting: Internal Medicine

## 2020-11-06 DIAGNOSIS — E291 Testicular hypofunction: Secondary | ICD-10-CM | POA: Diagnosis not present

## 2020-11-06 DIAGNOSIS — N411 Chronic prostatitis: Secondary | ICD-10-CM | POA: Diagnosis not present

## 2020-11-06 DIAGNOSIS — M5412 Radiculopathy, cervical region: Secondary | ICD-10-CM | POA: Diagnosis not present

## 2020-11-06 MED ORDER — HYDROCODONE-ACETAMINOPHEN 5-325 MG PO TABS
ORAL_TABLET | ORAL | 0 refills | Status: DC
  Filled 2020-11-06: qty 60, 15d supply, fill #0

## 2020-11-06 MED ORDER — FINASTERIDE 5 MG PO TABS
ORAL_TABLET | ORAL | 99 refills | Status: AC
  Filled 2020-11-06: qty 30, 30d supply, fill #0
  Filled 2020-12-06: qty 30, 30d supply, fill #1

## 2020-11-06 MED ORDER — METHOCARBAMOL 500 MG PO TABS
ORAL_TABLET | ORAL | 0 refills | Status: DC
  Filled 2020-11-06: qty 90, 23d supply, fill #0

## 2020-11-18 ENCOUNTER — Other Ambulatory Visit (HOSPITAL_BASED_OUTPATIENT_CLINIC_OR_DEPARTMENT_OTHER): Payer: Self-pay

## 2020-11-18 MED FILL — Sod Sulfate-Pot Sulf-Mg Sulf Oral Sol 17.5-3.13-1.6 GM/177ML: ORAL | 1 days supply | Qty: 354 | Fill #0 | Status: AC

## 2020-11-20 ENCOUNTER — Ambulatory Visit (AMBULATORY_SURGERY_CENTER): Payer: PPO | Admitting: Gastroenterology

## 2020-11-20 ENCOUNTER — Encounter: Payer: Self-pay | Admitting: Gastroenterology

## 2020-11-20 ENCOUNTER — Other Ambulatory Visit: Payer: Self-pay

## 2020-11-20 VITALS — BP 113/69 | HR 63 | Temp 97.5°F | Resp 11 | Ht 72.0 in | Wt 208.0 lb

## 2020-11-20 DIAGNOSIS — D123 Benign neoplasm of transverse colon: Secondary | ICD-10-CM | POA: Diagnosis not present

## 2020-11-20 DIAGNOSIS — Z86010 Personal history of colonic polyps: Secondary | ICD-10-CM | POA: Diagnosis not present

## 2020-11-20 DIAGNOSIS — Z1211 Encounter for screening for malignant neoplasm of colon: Secondary | ICD-10-CM | POA: Diagnosis not present

## 2020-11-20 MED ORDER — SODIUM CHLORIDE 0.9 % IV SOLN: 500.0000 mL | Freq: Once | INTRAVENOUS | Status: DC

## 2020-11-20 NOTE — Op Note (Signed)
[place] Patient Name: Erik [name] Procedure Date: 11/20/2020 3:29 PM MRN: [id number] Endoscopist: Milus Banister , MD Age: 65 Referring MD:  Date of Birth: 12/18/1955 Gender: Male Account #: [id number] Procedure:                Colonoscopy Indications:              High risk colon cancer surveillance: Personal                            history of colonic polyps; [facility]                            colonoscopies have found 3-4cm rectal SSA, also 1cm                            TA over past several years, last examination 2011.                            Colonoscopy 2017 single subCM adenoma removed Medicines:                Monitored Anesthesia Care Procedure:                Pre-Anesthesia Assessment:                           - Prior to the procedure, a History and Physical                            was performed, and patient medications and                            allergies were reviewed. The patient's tolerance of                            previous anesthesia was also reviewed. The risks                            and benefits of the procedure and the sedation                            options and risks were discussed with the patient.                            All questions were answered, and informed consent                            was obtained. Prior Anticoagulants: The patient has                            taken no previous anticoagulant or antiplatelet                            agents. ASA Grade Assessment: II - A patient with  mild systemic disease. After reviewing the risks                            and benefits, the patient was deemed in                            satisfactory condition to undergo the procedure.                           After obtaining informed consent, the colonoscope                            was passed under direct vision. Throughout the                            procedure, the patient's  blood pressure, pulse, and                            oxygen saturations were monitored continuously. The                            Colonoscope was introduced through the anus and                            advanced to the the cecum, identified by                            appendiceal orifice and ileocecal valve. The                            colonoscopy was performed without difficulty. The                            patient tolerated the procedure well. The quality                            of the bowel preparation was good. The ileocecal                            valve, appendiceal orifice, and rectum were                            photographed. Scope In: 3:34:13 PM Scope Out: 3:47:11 PM Scope Withdrawal Time: 0 hours 7 minutes 59 seconds  Total Procedure Duration: 0 hours 12 minutes 58 seconds  Findings:                 A 4 mm polyp was found in the transverse colon. The                            polyp was sessile. The polyp was removed with a                            cold snare. Resection and retrieval were complete.  The exam was otherwise without abnormality on                            direct and retroflexion views. Complications:            No immediate complications. Estimated blood loss:                            None. Estimated Blood Loss:     Estimated blood loss: none. Impression:               - One 4 mm polyp in the transverse colon, removed                            with a cold snare. Resected and retrieved.                           - The examination was otherwise normal on direct                            and retroflexion views. Recommendation:           - Patient has a contact number available for                            emergencies. The signs and symptoms of potential                            delayed complications were discussed with the                            patient. Return to normal activities tomorrow.                             Written discharge instructions were provided to the                            patient.                           - Resume previous diet.                           - Continue present medications.                           - Await pathology results. Milus Banister, MD 11/20/2020 3:51:07 PM This report has been signed electronically.

## 2020-11-20 NOTE — Patient Instructions (Signed)
Please read handouts provided. Continue present medications. Await pathology results.   YOU HAD AN ENDOSCOPIC PROCEDURE TODAY AT THE [facility]:   Refer to the procedure report that was given to you for any specific questions about what was found during the examination.  If the procedure report does not answer your questions, please call your gastroenterologist to clarify.  If you requested that your care partner not be given the details of your procedure findings, then the procedure report has been included in a sealed envelope for you to review at your convenience later.  YOU SHOULD EXPECT: Some feelings of bloating in the abdomen. Passage of more gas than usual.  Walking can help get rid of the air that was put into your GI tract during the procedure and reduce the bloating. If you had a lower endoscopy (such as a colonoscopy or flexible sigmoidoscopy) you may notice spotting of blood in your stool or on the toilet paper. If you underwent a bowel prep for your procedure, you may not have a normal bowel movement for a few days.  Please Note:  You might notice some irritation and congestion in your nose or some drainage.  This is from the oxygen used during your procedure.  There is no need for concern and it should clear up in a day or so.  SYMPTOMS TO REPORT IMMEDIATELY:  Following lower endoscopy (colonoscopy or flexible sigmoidoscopy):  Excessive amounts of blood in the stool  Significant tenderness or worsening of abdominal pains  Swelling of the abdomen that is new, acute  Fever of 100F or higher   For urgent or emergent issues, a gastroenterologist can be reached at any hour by calling [phone]. Do not use MyChart messaging for urgent concerns.    DIET:  We do recommend a small meal at first, but then you may proceed to your regular diet.  Drink plenty of fluids but you should avoid alcoholic beverages for 24 hours.  ACTIVITY:  You should plan to take it easy  for the rest of today and you should NOT DRIVE or use heavy machinery until tomorrow (because of the sedation medicines used during the test).    FOLLOW UP: Our staff will call the number listed on your records 48-72 hours following your procedure to check on you and address any questions or concerns that you may have regarding the information given to you following your procedure. If we do not reach you, we will leave a message.  We will attempt to reach you two times.  During this call, we will ask if you have developed any symptoms of COVID 19. If you develop any symptoms (ie: fever, flu-like symptoms, shortness of breath, cough etc.) before then, please call [phone].  If you test positive for Covid 19 in the 2 weeks post procedure, please call and report this information to us.    If any biopsies were taken you will be contacted by phone or by letter within the next 1-3 weeks.  Please call us at [phone] if you have not heard about the biopsies in 3 weeks.    SIGNATURES/CONFIDENTIALITY: You and/or your care partner have signed paperwork which will be entered into your electronic medical record.  These signatures attest to the fact that that the information above on your After Visit Summary has been reviewed and is understood.  Full responsibility of the confidentiality of this discharge information lies with you and/or your care-partner.  

## 2020-11-20 NOTE — Progress Notes (Signed)
Called to room to assist during endoscopic procedure.  Patient ID and intended procedure confirmed with present staff. Received instructions for my participation in the procedure from the performing physician.  

## 2020-11-20 NOTE — Progress Notes (Signed)
N.[place] vital signs. 

## 2020-11-20 NOTE — Progress Notes (Signed)
To PAcU, VSS. Report to [email or web address] 

## 2020-11-20 NOTE — Progress Notes (Signed)
Pt's states no medical or surgical changes since previsit or office visit. 

## 2020-11-22 ENCOUNTER — Telehealth: Payer: Self-pay

## 2020-11-22 NOTE — Telephone Encounter (Signed)
LVM

## 2020-11-25 ENCOUNTER — Encounter (HOSPITAL_COMMUNITY): Payer: Self-pay | Admitting: Neurosurgery

## 2020-11-26 ENCOUNTER — Encounter: Payer: Self-pay | Admitting: Gastroenterology

## 2020-11-27 ENCOUNTER — Other Ambulatory Visit: Payer: Self-pay

## 2020-11-27 ENCOUNTER — Ambulatory Visit: Payer: PPO | Admitting: Dermatology

## 2020-11-27 ENCOUNTER — Other Ambulatory Visit (HOSPITAL_BASED_OUTPATIENT_CLINIC_OR_DEPARTMENT_OTHER): Payer: Self-pay

## 2020-11-27 DIAGNOSIS — L820 Inflamed seborrheic keratosis: Secondary | ICD-10-CM | POA: Diagnosis not present

## 2020-11-27 DIAGNOSIS — L918 Other hypertrophic disorders of the skin: Secondary | ICD-10-CM

## 2020-11-27 DIAGNOSIS — L739 Follicular disorder, unspecified: Secondary | ICD-10-CM | POA: Diagnosis not present

## 2020-11-27 DIAGNOSIS — L821 Other seborrheic keratosis: Secondary | ICD-10-CM

## 2020-11-27 DIAGNOSIS — L719 Rosacea, unspecified: Secondary | ICD-10-CM

## 2020-11-27 DIAGNOSIS — Z1283 Encounter for screening for malignant neoplasm of skin: Secondary | ICD-10-CM

## 2020-11-27 MED ORDER — AZELAIC ACID 15 % EX GEL
1.0000 | Freq: Two times a day (BID) | CUTANEOUS | 6 refills | Status: AC
Start: 2020-11-27 — End: ?
  Filled 2020-11-27: qty 50, 25d supply, fill #0

## 2020-11-27 MED ORDER — CLOBETASOL PROPIONATE 0.05 % EX FOAM
Freq: Two times a day (BID) | CUTANEOUS | 6 refills | Status: AC
  Filled 2020-11-27: qty 50, 25d supply, fill #0

## 2020-11-28 ENCOUNTER — Other Ambulatory Visit (HOSPITAL_BASED_OUTPATIENT_CLINIC_OR_DEPARTMENT_OTHER): Payer: Self-pay

## 2020-12-05 DIAGNOSIS — N411 Chronic prostatitis: Secondary | ICD-10-CM | POA: Diagnosis not present

## 2020-12-06 ENCOUNTER — Other Ambulatory Visit (HOSPITAL_BASED_OUTPATIENT_CLINIC_OR_DEPARTMENT_OTHER): Payer: Self-pay

## 2020-12-10 ENCOUNTER — Encounter: Payer: Self-pay | Admitting: Dermatology

## 2020-12-10 ENCOUNTER — Other Ambulatory Visit: Payer: Self-pay

## 2020-12-10 NOTE — Progress Notes (Signed)
   Follow-Up Visit   Subjective  Erik [name] is a 65 y.o. male who presents for the following: Annual Exam (Patient is here for annual skin check. Concerns skin tags around the neck.).  Annual skin examination, lesions around neck have become bothersome. Location:  Duration:  Quality:  Associated Signs/Symptoms: Modifying Factors:  Severity:  Timing: Context:   Objective  Well appearing patient in no apparent distress; mood and affect are within normal limits. Objective  Head - Anterior (Face): Persistent central facial/forehead patchy edematous erythema compatible with rosacea.  Objective  Mid Back: Full body skin exam.  No atypical pigmented lesions or nonmelanoma skin cancer.  Objective  Scalp: History of recurring scalp folliculitis with improvement using topical clobetasol.  Objective  Neck - Anterior (3): Fleshy, skin-colored pedunculated papules.  Patient requests removal.  Objective  Mid Back, Neck - Anterior (4), Right Breast: Irritated pink/brown 2 to 5 mm keratotic papules    A full examination was performed including scalp, head, eyes, ears, nose, lips, neck, chest, axillae, abdomen, back, buttocks, bilateral upper extremities, bilateral lower extremities, hands, feet, fingers, toes, fingernails, and toenails. All findings within normal limits unless otherwise noted below.  Areas beneath undergarments not fully examined.   Assessment & Plan    Rosacea Head - Anterior (Face)  If out-of-pocket expense reasonable, we will try topical Fabior foam/generic azelaic acid applied nightly to areas prone to breakout.  Ordered Medications: Azelaic Acid 15 % gel  Screening for malignant neoplasm of skin Mid Back  Yearly skin exam.  Folliculitis Scalp  Encouraged to use the clobetasol only for flares rather than every day and avoid using on face and body folds.  Ordered Medications: clobetasol (OLUX) 0.05 % topical foam  Skin tag (3) Neck -  Anterior  Destruction of lesion - Neck - Anterior Complexity: simple   Destruction method comment:  Scissors were used to snip tag at the base Informed consent: discussed and consent obtained   Timeout:  patient name, date of birth, surgical site, and procedure verified Anesthesia: the lesion was anesthetized in a standard fashion   Anesthetic:  1% lidocaine w/ epinephrine 1-100,000 local infiltration Hemostasis achieved with:  pressure and ferric subsulfate Outcome: patient tolerated procedure well with no complications   Post-procedure details: wound care instructions given    Inflamed seborrheic keratosis (6) Neck - Anterior (4); Right Breast; Mid Back  Benign no treatment needed. LN2 on the ones around the neck.   Destruction of lesion - Neck - Anterior Complexity: simple   Destruction method: cryotherapy   Informed consent: discussed and consent obtained   Timeout:  patient name, date of birth, surgical site, and procedure verified Lesion destroyed using liquid nitrogen: Yes   Cryotherapy cycles:  3 Outcome: patient tolerated procedure well with no complications   Post-procedure details: wound care instructions given        I, Erik Monarch, MD, have reviewed all documentation for this visit.  The documentation on 12/10/20 for the exam, diagnosis, procedures, and orders are all accurate and complete.

## 2020-12-11 ENCOUNTER — Other Ambulatory Visit: Payer: Self-pay

## 2020-12-11 ENCOUNTER — Ambulatory Visit (INDEPENDENT_AMBULATORY_CARE_PROVIDER_SITE_OTHER): Payer: PPO | Admitting: Internal Medicine

## 2020-12-11 ENCOUNTER — Encounter: Payer: Self-pay | Admitting: Internal Medicine

## 2020-12-11 VITALS — BP 124/72 | HR 82 | Temp 98.0°F | Resp 16 | Ht 72.0 in | Wt 202.4 lb

## 2020-12-11 DIAGNOSIS — Z0000 Encounter for general adult medical examination without abnormal findings: Secondary | ICD-10-CM

## 2020-12-11 DIAGNOSIS — Z23 Encounter for immunization: Secondary | ICD-10-CM | POA: Diagnosis not present

## 2020-12-11 MED FILL — Testosterone Cypionate IM Inj in Oil 200 MG/ML: INTRAMUSCULAR | 84 days supply | Qty: 6 | Fill #0 | Status: AC

## 2020-12-11 NOTE — Patient Instructions (Addendum)
   GO TO THE LAB : Get the blood work     [place], [place] back for a physical in 1 year     "Living will", "[place] of attorney": Advanced care planning  (If you already have a living will or healthcare power of attorney, please bring the copy to be scanned in your chart.)  Advance care planning is a process that supports adults in  understanding and sharing their preferences regarding future medical care.   The patient's preferences are recorded in documents called Advance Directives.    Advanced directives are completed (and can be modified at any time) while the patient is in full mental capacity.   The documentation should be available at all times to the patient, the family and the healthcare providers.  Bring in a copy to be scanned in your chart is an excellent idea and is recommended   This legal documents direct treatment decision making and/or appoint a surrogate to make the decision if the patient is not capable to do so.    Advance directives can be documented in many types of formats,  documents have names such as:  Lliving will  Durable power of attorney for healthcare (healthcare proxy or healthcare power of attorney)  Combined directives  Physician orders for life-sustaining treatment    More information at:  [email or web address]

## 2020-12-11 NOTE — Progress Notes (Signed)
Subjective:    Patient ID: Erik [name], male    DOB: 1956/06/26, 65 y.o.   MRN: [id number]  DOS:  12/11/2020 Type of visit - description: Here for CPX  Since the last office visit he is doing well. Had neck fusion in April and is recuperating well, very happy with the results.  Review of Systems  Other than above, a 14 point review of systems is negative      Past Medical History:  Diagnosis Date   ALLERGIC RHINITIS 12/19/2009   Allergy    Arthritis    "in my hands and back"   Back pain chronic    On hydrocodone-Neurontin   Blepharitis    B, chronic   BPH (benign prostatic hyperplasia)    Cervical vertebral fusion 07/29/2020   1994 C4-5-6   Chronic prostatitis     f/u by urology, has a prostate massage prn   COVID-19 03/2019   GERD (gastroesophageal reflux disease)    Groin injury    HYPOGONADISM, MALE 08/11/2007   f/u by Dr Thomasene Mohair urology     OSTEOPOROSIS, IDIOPATHIC 08/11/2007   f/u at [facility] , now improved to osteopenia   Pneumonia    PONV (postoperative nausea and vomiting)    "zofran is my friend"   Raynaud's disease /phenomenon 08/16/2012   Rosacea    Shingles 09-2014   Varicose veins     Past Surgical History:  Procedure Laterality Date   ANTERIOR CERVICAL DECOMP/DISCECTOMY FUSION N/A 10/17/2020   Procedure: Right side access, Cervical three-four  Anterior cervical decompression/discectomy/fusion with exploration and removal of plate from cervical four to cervical six;  Surgeon: Erline Levine, MD;  Location: [facility];  Service: Neurosurgery;  Laterality: N/A;   BLEPHAROPLASTY     B blepharoplasty @ [facility] 08-2011   CERVICAL DISCECTOMY  [place]   C4-C5-C6 fusion   CHEILECTOMY Left 09/26/2019   Procedure: CHEILECTOMY LEFT GREAT TOE;  Surgeon: Newt Minion, MD;  Location: [facility];  Service: Orthopedics;  Laterality: Left;   COLONOSCOPY     EYE SURGERY     eyelid surgery on both eyes   HERNIA REPAIR Left 1979   IR  GENERIC HISTORICAL  03/17/2016   IR EMBO VENOUS NOT HEMORR HEMANG  INC GUIDE ROADMAPPING 03/17/2016 Greggory Keen, MD [facility]TR[facility]OUND   KNEE ARTHROSCOPY     POLYPECTOMY     SHOULDER SURGERY Right    early 2000s   SIGMOIDOSCOPY     TRANSURETHRAL RESECTION OF PROSTATE  07-2009   VARICOSE VEIN SURGERY     Social History   Socioeconomic History   Marital status: Married    Spouse name: Not on file   Number of children: 3   Years of education: Not on file   Highest education level: Not on file  Occupational History   Occupation: real state   Tobacco Use   Smoking status: Never   Smokeless tobacco: Never  Vaping Use   Vaping Use: Never used  Substance and Sexual Activity   Alcohol use: No    Alcohol/week: 0.0 standard drinks   Drug use: No   Sexual activity: Yes  Other Topics Concern   Not on file  Social History Narrative   Lives with wife.    They have three children.   Daughter is an Ship broker     Social Determinants of Radio broadcast assistant Strain: Not on file  Food Insecurity: Not on file  Transportation  Needs: Not on file  Physical Activity: Not on file  Stress: Not on file  Social Connections: Not on file  Intimate Partner Violence: Not on file    Allergies as of 12/11/2020       Reactions   Sulfa Drugs Cross Reactors Nausea And Vomiting        Medication List        Accurate as of December 11, 2020 11:59 PM. If you have any questions, ask your nurse or doctor.          STOP taking these medications    oxyCODONE 5 MG immediate release tablet Commonly known as: Oxy IR/ROXICODONE Stopped by: Kathlene November, MD   Vitamin D3 50 MCG (2000 UT) Tabs Stopped by: Kathlene November, MD       TAKE these medications    amLODipine 2.5 MG tablet Commonly known as: Norvasc Take 1 tablet (2.5 mg total) by mouth daily. What changed:  when to take this reasons to take this   Azelaic Acid 15 % gel Apply 1 application topically 2 (two) times daily. After skin is  thoroughly washed and patted dry, gently but thoroughly massage a thin film of azelaic acid cream into the affected area twice daily, in the morning and evening.   B-D INTEGRA SYRINGE 23G X 1" 3 ML Misc Generic drug: SYRINGE-NEEDLE (DISP) 3 ML   clobetasol 0.05 % topical foam Commonly known as: OLUX Apply topically 2 (two) times daily.   finasteride 5 MG tablet Commonly known as: PROSCAR Take 1 tablet (5 mg total) by mouth daily for 30 days.   HYDROcodone-acetaminophen 5-325 MG tablet Commonly known as: NORCO/VICODIN take 1 tablet by mouth every 6 hours as needed for pain   hyoscyamine 0.125 MG SL tablet Commonly known as: LEVSIN SL Place 1 tablet (0.125 mg total) under the tongue every 4 (four) hours as needed. Take 1 tab every 4 to 6 hours, under the tongue as needed for abdominal spasms   Lidocaine-Hydrocortisone Ace 1-1 % Crea APPLY 1 APPLICATION ON TO THE SKIN 2 TIMES DAILY AS NEEDED FOR INTERNAL HERORRHOIDS   methocarbamol 500 MG tablet Commonly known as: ROBAXIN take 1 tablet by mouth 4 times every day   Multi-Vitamins Tabs Take 1 tablet by mouth daily.   omeprazole 40 MG capsule Commonly known as: PRILOSEC TAKE 1 CAPSULE BY MOUTH 1 TO 2 TIMES A DAY FOR HEARTBURN AND REFLUX What changed:  how much to take how to take this when to take this   ondansetron 4 MG tablet Commonly known as: ZOFRAN Take 2 tablets by mouth every 8 hours for 2 days   PreserVision AREDS 2 Caps Take 1 capsule by mouth 2 (two) times daily.   Proctofoam HC rectal foam Generic drug: hydrocortisone-pramoxine PLACE 1 APPLICATOR RECTALLY 2 TIMES DAILY   Proctozone-HC 2.5 % rectal cream Generic drug: hydrocortisone Place 1 application rectally 2 (two) times daily.   sildenafil 20 MG tablet Commonly known as: REVATIO Take 20 mg by mouth daily as needed (ED).   SYSTANE PRESERVATIVE FREE OP Place 1 drop into both eyes daily as needed (Dry eye).   tadalafil 5 MG tablet Commonly known as:  CIALIS Take 5 mg by mouth daily.   testosterone cypionate 200 MG/ML injection Commonly known as: DEPOTESTOSTERONE CYPIONATE INJECT 1 ML (200 MG TOTAL) INTO THE MUSCLE EVERY 14 DAYS FOR 30 DAYS. What changed:  how much to take how to take this when to take this   tiZANidine 4 MG tablet  Commonly known as: ZANAFLEX TAKE 1 TABLET BY MOUTH UP TO 3 TIMES A DAY AS NEEDED           Objective:   Physical Exam BP 124/72 (BP Location: Left Arm, Patient Position: Sitting, Cuff Size: Small)   Pulse 82   Temp 98 F (36.7 C) (Oral)   Resp 16   Ht 6' (1.829 m)   Wt 202 lb 6 oz (91.8 kg)   SpO2 98%   BMI 27.45 kg/m  General: Well developed, NAD, BMI noted Neck: Deferred. HEENT:  Normocephalic . Face symmetric, atraumatic Lungs:  CTA B Normal respiratory effort, no intercostal retractions, no accessory muscle use. Heart: RRR,  no murmur.  Abdomen:  Not distended, soft, non-tender. No rebound or rigidity.   Lower extremities: no pretibial edema bilaterally  Skin: Exposed areas without rash. Not pale. Not jaundice Neurologic:  alert & oriented X3.  Speech normal, gait appropriate for age and unassisted Strength symmetric and appropriate for age.  Psych: Cognition and judgment appear intact.  Cooperative with normal attention span and concentration.  Behavior appropriate. No anxious or depressed appearing.     Assessment      Assessment   Osteoporosis, idiopathic, follow[facility] at [facility]. Raynaud phenomena 2013; amlodipine prn.   MSK -- s/p Cervical fusion x 2 --Chronic back pain: used to be on Hydrocodone rx by pcp;  on gabapentin ) -- L shoulder adhesive capsulitis -- stress fracture L sesamoid (foot) dx 2018) GU: Dr. Thomasene Mohair, urology --BPH, TURP 2011 --Chronic prostatitis --Hypogonadism, per urology Rosacea Varicose veins treatment @ [facility] OPHT: Glaucoma -Chronic blepharitis- ocular rosacea- Mac degeneration H/o Shingles 09-2014 Covid infex: 2020  PLAN Here for  CPX Osteoporosis: Follow-up at [facility], recently told vitamin D was high, stopped supplements. MSK: Had a cervical fusion few weeks ago, very happy with the results.  Still takes some hydrocodone, and alternates Robaxin/Zanaflex at night. Hypogonadism, chronic prostatitis: Per urology. He gets some of his care at the [place]. RTC 1 years     This visit occurred during the SARS-CoV-2 public health emergency.  Safety protocols were in place, including screening questions prior to the visit, additional usage of staff PPE, and extensive cleaning of exam room while observing appropriate contact time as indicated for disinfecting solutions.

## 2020-12-12 ENCOUNTER — Other Ambulatory Visit (HOSPITAL_BASED_OUTPATIENT_CLINIC_OR_DEPARTMENT_OTHER): Payer: Self-pay

## 2020-12-12 ENCOUNTER — Encounter: Payer: Self-pay | Admitting: Internal Medicine

## 2020-12-12 LAB — LIPID PANEL
Cholesterol: 191 mg/dL (ref 0–200)
HDL: 43.3 mg/dL (ref >39.00)
LDL Cholesterol: 129 mg/dL — ABNORMAL HIGH (ref 0–99)
NonHDL: 147.71
Total CHOL/HDL Ratio: 4
Triglycerides: 95 mg/dL (ref 0.0–149.0)
VLDL: 19 mg/dL (ref 0.0–40.0)

## 2020-12-12 LAB — HEMOGLOBIN A1C: Hgb A1c MFr Bld: 5.6 % (ref 4.6–6.5)

## 2020-12-12 LAB — AST: AST: 17 U/L (ref 0–37)

## 2020-12-12 LAB — ALT: ALT: 16 U/L (ref 0–53)

## 2020-12-12 NOTE — Assessment & Plan Note (Signed)
Here for CPX Osteoporosis: Follow-up at [facility], recently told vitamin D was high, stopped supplements. MSK: Had a cervical fusion few weeks ago, very happy with the results.  Still takes some hydrocodone, and alternates Robaxin/Zanaflex at night. Hypogonadism, chronic prostatitis: Per urology. He gets some of his care at the [place]. RTC 1 years

## 2020-12-12 NOTE — Assessment & Plan Note (Signed)
-  Td 2013 - Zostavax : 2014; s/p shingrex x 2 - pnm 13 05/2019; PNM 23: today - covid vax: Benefits discussed, declined -CCS:  Colonoscopy Dr Waverly Ferrari, first [place] 2004 (polyp) , then  01/02/2008 (tubular adenoma), then 10-2015:1 polyp, C-scope 11/20/2020, next per GI. -PSA per urology -Diet, exercise:  Doing well -Labs: Reviewed, will check AST, ALT, FLP, A1c - POA discussed.

## 2020-12-16 ENCOUNTER — Other Ambulatory Visit (HOSPITAL_BASED_OUTPATIENT_CLINIC_OR_DEPARTMENT_OTHER): Payer: Self-pay

## 2020-12-18 ENCOUNTER — Other Ambulatory Visit (HOSPITAL_BASED_OUTPATIENT_CLINIC_OR_DEPARTMENT_OTHER): Payer: Self-pay

## 2020-12-18 DIAGNOSIS — M5412 Radiculopathy, cervical region: Secondary | ICD-10-CM | POA: Diagnosis not present

## 2020-12-18 MED ORDER — METHOCARBAMOL 500 MG PO TABS
ORAL_TABLET | ORAL | 0 refills | Status: AC
  Filled 2020-12-18: qty 90, 23d supply, fill #0

## 2020-12-18 MED ORDER — HYDROCODONE-ACETAMINOPHEN 5-325 MG PO TABS
ORAL_TABLET | ORAL | 0 refills | Status: AC
  Filled 2020-12-18: qty 60, 15d supply, fill #0

## 2020-12-20 ENCOUNTER — Other Ambulatory Visit (HOSPITAL_BASED_OUTPATIENT_CLINIC_OR_DEPARTMENT_OTHER): Payer: Self-pay

## 2020-12-20 MED ORDER — AMOXICILLIN 500 MG PO CAPS
ORAL_CAPSULE | ORAL | 0 refills | Status: AC
  Filled 2020-12-20: qty 28, 7d supply, fill #0

## 2020-12-24 DIAGNOSIS — N411 Chronic prostatitis: Secondary | ICD-10-CM | POA: Diagnosis not present

## 2021-02-20 ENCOUNTER — Telehealth: Payer: Self-pay | Admitting: Internal Medicine

## 2021-02-20 NOTE — Telephone Encounter (Signed)
Left message for patient to call back and schedule Medicare Annual Wellness Visit (AWV) in office.   If not able to come in office, please offer to do virtually or by telephone.  Left office number and my jabber #[phone].  Due for AWVI  Please schedule at anytime with Nurse Health Advisor.   

## 2021-03-03 ENCOUNTER — Other Ambulatory Visit (HOSPITAL_BASED_OUTPATIENT_CLINIC_OR_DEPARTMENT_OTHER): Payer: Self-pay

## 2021-03-06 ENCOUNTER — Other Ambulatory Visit (HOSPITAL_COMMUNITY): Payer: Self-pay

## 2021-03-06 ENCOUNTER — Other Ambulatory Visit (HOSPITAL_BASED_OUTPATIENT_CLINIC_OR_DEPARTMENT_OTHER): Payer: Self-pay

## 2021-03-06 MED FILL — Testosterone Cypionate IM Inj in Oil 200 MG/ML: INTRAMUSCULAR | 84 days supply | Qty: 6 | Fill #1 | Status: AC

## 2021-03-07 ENCOUNTER — Other Ambulatory Visit (HOSPITAL_COMMUNITY): Payer: Self-pay

## 2021-05-28 ENCOUNTER — Telehealth: Payer: Self-pay | Admitting: Internal Medicine

## 2021-05-28 NOTE — Telephone Encounter (Signed)
I called patient to schedule AWV.  Patient said he has moved to [place] and he has a PCP in [place].  Please remove PCP.

## 2021-11-21 ENCOUNTER — Other Ambulatory Visit (HOSPITAL_BASED_OUTPATIENT_CLINIC_OR_DEPARTMENT_OTHER): Payer: Self-pay

## 2021-11-21 MED ORDER — TADALAFIL 5 MG PO TABS
ORAL_TABLET | ORAL | 3 refills | Status: AC
  Filled 2021-11-21: qty 90, 90d supply, fill #0

## 2021-11-21 MED ORDER — SILDENAFIL CITRATE 20 MG PO TABS
20.0000 mg | ORAL_TABLET | ORAL | 6 refills | Status: AC | PRN
  Filled 2021-11-21: qty 45, 45d supply, fill #0

## 2021-11-28 ENCOUNTER — Other Ambulatory Visit (HOSPITAL_BASED_OUTPATIENT_CLINIC_OR_DEPARTMENT_OTHER): Payer: Self-pay

## 2021-12-01 ENCOUNTER — Ambulatory Visit: Payer: PPO | Admitting: Dermatology

## 2021-12-02 ENCOUNTER — Ambulatory Visit: Payer: PPO | Admitting: Dermatology

## 2021-12-03 ENCOUNTER — Ambulatory Visit: Payer: PPO | Admitting: Dermatology
